# Patient Record
Sex: Female | Born: 1940 | Race: Asian | Hispanic: No | Smoking: Never smoker
Health system: Southern US, Community
[De-identification: ages and names within clinical notes are randomized; demographics above are authoritative.]

## PROBLEM LIST (undated history)

## (undated) DIAGNOSIS — E559 Vitamin D deficiency, unspecified: Secondary | ICD-10-CM

## (undated) DIAGNOSIS — R1013 Epigastric pain: Secondary | ICD-10-CM

## (undated) DIAGNOSIS — N390 Urinary tract infection, site not specified: Secondary | ICD-10-CM

## (undated) DIAGNOSIS — I10 Essential (primary) hypertension: Secondary | ICD-10-CM

## (undated) DIAGNOSIS — M1990 Unspecified osteoarthritis, unspecified site: Secondary | ICD-10-CM

## (undated) DIAGNOSIS — K589 Irritable bowel syndrome without diarrhea: Secondary | ICD-10-CM

## (undated) DIAGNOSIS — K649 Unspecified hemorrhoids: Secondary | ICD-10-CM

## (undated) DIAGNOSIS — K5790 Diverticulosis of intestine, part unspecified, without perforation or abscess without bleeding: Secondary | ICD-10-CM

## (undated) DIAGNOSIS — E785 Hyperlipidemia, unspecified: Secondary | ICD-10-CM

## (undated) DIAGNOSIS — D592 Drug-induced nonautoimmune hemolytic anemia: Secondary | ICD-10-CM

## (undated) DIAGNOSIS — K449 Diaphragmatic hernia without obstruction or gangrene: Secondary | ICD-10-CM

## (undated) DIAGNOSIS — K219 Gastro-esophageal reflux disease without esophagitis: Secondary | ICD-10-CM

## (undated) DIAGNOSIS — T7840XA Allergy, unspecified, initial encounter: Secondary | ICD-10-CM

## (undated) DIAGNOSIS — I700 Atherosclerosis of aorta: Secondary | ICD-10-CM

## (undated) DIAGNOSIS — K2970 Gastritis, unspecified, without bleeding: Secondary | ICD-10-CM

## (undated) DIAGNOSIS — D539 Nutritional anemia, unspecified: Secondary | ICD-10-CM

## (undated) DIAGNOSIS — E039 Hypothyroidism, unspecified: Secondary | ICD-10-CM

## (undated) DIAGNOSIS — R159 Full incontinence of feces: Secondary | ICD-10-CM

## (undated) HISTORY — DX: Diverticulosis of intestine, part unspecified, without perforation or abscess without bleeding: K57.90

## (undated) HISTORY — DX: Hyperlipidemia, unspecified: E78.5

## (undated) HISTORY — DX: Other disorders of iron metabolism: E83.19

## (undated) HISTORY — DX: Full incontinence of feces: R15.9

## (undated) HISTORY — DX: Essential (primary) hypertension: I10

## (undated) HISTORY — DX: Unspecified hemorrhoids: K64.9

## (undated) HISTORY — PX: COLONOSCOPY: SHX174

## (undated) HISTORY — DX: Gastritis, unspecified, without bleeding: K29.70

## (undated) HISTORY — DX: Gastro-esophageal reflux disease without esophagitis: K21.9

## (undated) HISTORY — DX: Vitamin D deficiency, unspecified: E55.9

## (undated) HISTORY — DX: Urinary tract infection, site not specified: N39.0

## (undated) HISTORY — DX: Atherosclerosis of aorta: I70.0

## (undated) HISTORY — DX: Drug-induced nonautoimmune hemolytic anemia: D59.2

## (undated) HISTORY — DX: Irritable bowel syndrome, unspecified: K58.9

## (undated) HISTORY — DX: Epigastric pain: R10.13

## (undated) HISTORY — DX: Hypothyroidism, unspecified: E03.9

## (undated) HISTORY — DX: Diaphragmatic hernia without obstruction or gangrene: K44.9

## (undated) HISTORY — DX: Allergy, unspecified, initial encounter: T78.40XA

## (undated) HISTORY — DX: Nutritional anemia, unspecified: D53.9

---

## 2003-12-30 ENCOUNTER — Encounter: Admission: RE | Admit: 2003-12-30 | Discharge: 2003-12-30 | Payer: Self-pay | Admitting: Internal Medicine

## 2004-03-04 ENCOUNTER — Encounter (INDEPENDENT_AMBULATORY_CARE_PROVIDER_SITE_OTHER): Payer: Self-pay | Admitting: *Deleted

## 2004-03-04 ENCOUNTER — Ambulatory Visit (HOSPITAL_COMMUNITY): Admission: RE | Admit: 2004-03-04 | Discharge: 2004-03-04 | Payer: Self-pay | Admitting: *Deleted

## 2004-07-28 ENCOUNTER — Other Ambulatory Visit: Admission: RE | Admit: 2004-07-28 | Discharge: 2004-07-28 | Payer: Self-pay | Admitting: Internal Medicine

## 2007-01-18 ENCOUNTER — Encounter: Admission: RE | Admit: 2007-01-18 | Discharge: 2007-01-18 | Payer: Self-pay | Admitting: Internal Medicine

## 2007-01-27 ENCOUNTER — Encounter: Admission: RE | Admit: 2007-01-27 | Discharge: 2007-01-27 | Payer: Self-pay | Admitting: Internal Medicine

## 2008-04-16 ENCOUNTER — Encounter: Admission: RE | Admit: 2008-04-16 | Discharge: 2008-07-15 | Payer: Self-pay | Admitting: Internal Medicine

## 2009-05-08 ENCOUNTER — Encounter: Admission: RE | Admit: 2009-05-08 | Discharge: 2009-05-08 | Payer: Self-pay | Admitting: Internal Medicine

## 2010-04-04 ENCOUNTER — Encounter: Payer: Self-pay | Admitting: Emergency Medicine

## 2010-04-05 ENCOUNTER — Encounter: Payer: Self-pay | Admitting: Internal Medicine

## 2010-07-31 NOTE — Op Note (Signed)
NAMECHARIKA, [name] NO.:  [id number]   MEDICAL RECORD NO.:  [id number]          PATIENT TYPE:  AMB   LOCATION:  ENDO                         FACILITY:  [facility]   PHYSICIAN:  Georgiana Spinner, M.D.    DATE OF BIRTH:  [birth year]   DATE OF PROCEDURE:  DATE OF DISCHARGE:                                 OPERATIVE REPORT   PROCEDURE:  Colonoscopy.   INDICATIONS FOR PROCEDURE:  Colon cancer screening.   ANESTHESIA:  Demerol 10, Versed 2 mg.   DESCRIPTION OF PROCEDURE:  With the patient mildly sedated in the left  lateral decubitus position, the Olympus videoscopic colonoscope was inserted  in the rectum and passed under direct vision with pressure applied to the  abdomen to the cecum, identified by the ileocecal valve and crow's foot of  the cecum.  From this point, the colonoscope was slowly withdrawn, taking  circumferential views of the colonic mucosa, stopping only in the rectum  which appeared normal on direct and showed hemorrhoids on retroflex view.  The endoscope was straightened and withdrawn.  The patient's vital signs and  pulse oximetry remained stable.  The patient tolerated the procedure well  without apparent complications.   FINDINGS:  Internal hemorrhoids.  Otherwise unremarkable exam.   PLAN:  See endoscopy note for further details.      GMO/MEDQ  D:  03/04/2004  T:  03/04/2004  Job:  [id number]

## 2010-07-31 NOTE — Op Note (Signed)
NAMEDARICE, VICARIO NO.:  [id number]   MEDICAL RECORD NO.:  [id number]          PATIENT TYPE:  AMB   LOCATION:  ENDO                         FACILITY:  [facility]   PHYSICIAN:  Georgiana Spinner, M.D.    DATE OF BIRTH:  10-16-1940   DATE OF PROCEDURE:  03/04/2004  DATE OF DISCHARGE:                                 OPERATIVE REPORT   PROCEDURE:  Upper endoscopy.   INDICATIONS:  GERD.   ANESTHESIA:  1.  Demerol 50 mg.  2.  Versed 5 mg.   DESCRIPTION OF PROCEDURE:  With patient mildly sedated in the left lateral  decubitus position, the Olympus videoscopic endoscope was inserted in the  mouth, passed under direct vision through the esophagus, which appeared  normal, into the stomach.  Fundus appeared normal.  Body showed a patchy,  erythematous change which was photographed and subsequently biopsied.  We  entered through the antrum and pylorus into the duodenal bulb, second  portion of duodenum, both of which appeared normal.  From this point, the  endoscope was slowly withdrawn, taking circumferential views of the duodenal  mucosa until the endoscope had been pulled back into the stomach, placed in  retroflexion to view the stomach from below.  The endoscope was  straightened, withdrawn, taking circumferential views of the remaining  gastric and esophageal mucosa.  The patient's vital signs and pulse oximeter  remained stable.  The patient tolerated the procedure well without apparent  complications.   FINDINGS:  Patchy, erythematous mucosa of the body of the stomach.  Await  biopsy report.  The patient will call me for results and follow up with me  as an outpatient.  Proceed to colonoscopy.      GMO/MEDQ  D:  03/04/2004  T:  03/04/2004  Job:  [id number]

## 2010-12-24 ENCOUNTER — Encounter: Payer: Self-pay | Admitting: Internal Medicine

## 2011-01-14 ENCOUNTER — Encounter: Payer: Self-pay | Admitting: Internal Medicine

## 2011-01-14 ENCOUNTER — Ambulatory Visit: Payer: Self-pay | Admitting: Internal Medicine

## 2011-01-18 ENCOUNTER — Encounter: Payer: Self-pay | Admitting: *Deleted

## 2011-01-25 ENCOUNTER — Encounter: Payer: Self-pay | Admitting: Internal Medicine

## 2011-01-25 ENCOUNTER — Ambulatory Visit (INDEPENDENT_AMBULATORY_CARE_PROVIDER_SITE_OTHER): Payer: Medicare Other | Admitting: Internal Medicine

## 2011-01-25 DIAGNOSIS — E119 Type 2 diabetes mellitus without complications: Secondary | ICD-10-CM | POA: Insufficient documentation

## 2011-01-25 DIAGNOSIS — R110 Nausea: Secondary | ICD-10-CM

## 2011-01-25 DIAGNOSIS — E039 Hypothyroidism, unspecified: Secondary | ICD-10-CM | POA: Insufficient documentation

## 2011-01-25 DIAGNOSIS — I10 Essential (primary) hypertension: Secondary | ICD-10-CM | POA: Insufficient documentation

## 2011-01-25 DIAGNOSIS — K219 Gastro-esophageal reflux disease without esophagitis: Secondary | ICD-10-CM | POA: Insufficient documentation

## 2011-01-25 DIAGNOSIS — M810 Age-related osteoporosis without current pathological fracture: Secondary | ICD-10-CM | POA: Insufficient documentation

## 2011-01-25 DIAGNOSIS — R1013 Epigastric pain: Secondary | ICD-10-CM

## 2011-01-25 DIAGNOSIS — E785 Hyperlipidemia, unspecified: Secondary | ICD-10-CM | POA: Insufficient documentation

## 2011-01-25 MED ORDER — ONDANSETRON 4 MG PO TBDP: ORAL_TABLET | ORAL | Status: DC

## 2011-01-25 MED ORDER — PANTOPRAZOLE SODIUM 40 MG PO TBEC: 40.0000 mg | DELAYED_RELEASE_TABLET | Freq: Every day | ORAL | Status: DC

## 2011-01-25 NOTE — Patient Instructions (Signed)
We scheduled the Endoscopy with Dr. Rhea Belton.  We sent prescriptions for the Protonix and the Zofran ODT nausea medication to [facility].  Directions and brochure provided.Upper GI Endoscopy Upper GI endoscopy means using a flexible scope to look at the esophagus, stomach, and upper small bowel. This is done to make a diagnosis in people with heartburn, abdominal pain, or abnormal bleeding. Sometimes an endoscope is needed to remove foreign bodies or food that become stuck in the esophagus; it can also be used to take biopsy samples. For the best results, do not eat or drink for 8 hours before having your upper endoscopy.  To perform the endoscopy, you will probably be sedated and your throat will be numbed with a special spray. The endoscope is then slowly passed down your throat (this will not interfere with your breathing). An endoscopy exam takes 15 to 30 minutes to complete and there is no real pain. Patients rarely remember much about the procedure. The results of the test may take several days if a biopsy or other test is taken.  You may have a sore throat after an endoscopy exam. Serious complications are very rare. Stick to liquids and soft foods until your pain is better. Do not drive a car or operate any dangerous equipment for at least 24 hours after being sedated. SEEK IMMEDIATE MEDICAL CARE IF:   You have severe throat pain.   You have shortness of breath.   You have bleeding problems.   You have a fever.   You have difficulty recovering from your sedation.  Document Released: 04/08/2004 Document Revised: 11/11/2010 Document Reviewed: 03/03/2008 [facility] Patient Information 2012 [place], [place].

## 2011-01-25 NOTE — Progress Notes (Signed)
Subjective:    Patient ID: Lauren [name], female    DOB: 02-Sep-1940, 70 y.o.   MRN: [id number]  HPI Lauren [name] is a 70 year old [place] female with a PMH of hypertension, hyperlipidemia, diabetes, hypothyroidism, GERD, osteoporosis, irritable bowel syndrome who is seen in consultation at the request of Dr.Pang for evaluation of epigastric pain weight loss.  The patient is accompanied today by a family member who is interpreting for her.  The patient reports epigastric pain over the last several weeks to months. This pain is worse with eating. It does not radiate. She also has associated nausea but no vomiting. She has lost 8-10 pounds over the last 4-5 months. She has been using lansoprazole 15 mg daily, and she feels this medication has helped, but not completely relieved her symptoms. Her PCP did prescribe pantoprazole to replace the lansoprazole, but she never received this medication by mail. The patient denies dysphagia or odynophagia. She was having heartburn however this improved with lansoprazole. She reports her appetite is "okay", but at times she feels full quickly. She denies rectal bleeding or melena. Several weeks ago she was having trouble with loose stool, however now she feels more constipation. She is not using anything for constipation at present. No fevers chills or night sweats.   Review of Systems Constitutional: Negative for fever, chills, night sweats, activity change, appetite change and unexpected weight change HEENT: Negative for sore throat, mouth sores and trouble swallowing. Eyes: Negative for visual disturbance Respiratory: Negative for cough, chest tightness and shortness of breath Cardiovascular: Negative for chest pain, palpitations and lower extremity swelling Gastrointestinal: See history of present illness Genitourinary: Negative for dysuria and hematuria. Musculoskeletal: Negative for arthralgias and myalgias, positive for back pain Skin: Negative for rash or color  change Neurological: Negative for weakness, numbness, positive for headaches Hematological: Negative for adenopathy, negative for easy bruising/bleeding Psychiatric/behavioral: Negative for depressed mood, negative for anxiety   Past Medical History  Diagnosis Date  . Diabetes mellitus   . Hypertension   . Hyperlipidemia   . Hypothyroidism   . GERD (gastroesophageal reflux disease)   . Osteoporosis   . IBS (irritable bowel syndrome)   . Dyspepsia   . UTI (lower urinary tract infection)    Current Outpatient Prescriptions  Medication Sig Dispense Refill  . alendronate (FOSAMAX) 70 MG tablet Take 70 mg by mouth every 7 (seven) days. Take with a full glass of water on an empty stomach.       Marland Kitchen aspirin 81 MG tablet Take 81 mg by mouth daily.        Marland Kitchen levothyroxine (SYNTHROID, LEVOTHROID) 75 MCG tablet Take 75 mcg by mouth daily.        Marland Kitchen losartan (COZAAR) 100 MG tablet Take 100 mg by mouth daily.        . metFORMIN (GLUCOPHAGE) 500 MG tablet Take 500 mg by mouth 2 (two) times daily with a meal.        . pravastatin (PRAVACHOL) 40 MG tablet Take 40 mg by mouth daily.        . sitaGLIPtan-metformin (JANUMET) 50-500 MG per tablet Take 1 tablet by mouth 2 (two) times daily with a meal.        . ondansetron (ZOFRAN ODT) 4 MG disintegrating tablet Take 1 tab by mouth every 6 hours as needed for nausea.  30 tablet  2  . pantoprazole (PROTONIX) 40 MG tablet Take 1 tablet (40 mg total) by mouth daily.  30 tablet  1  No Known Allergies  Family History  Problem Relation Age of Onset  . Colon cancer Neg Hx   --neg for gastric cancer   Social History  . Marital Status: Married   Social History Main Topics  . Smoking status: Never Smoker   . Smokeless tobacco: None  . Alcohol Use: No  . Drug Use: No      Objective:   Physical Exam BP 140/72  Pulse 64  Ht 5' (1.524 m)  Wt 116 lb 9.6 oz (52.889 kg)  BMI 22.77 kg/m2 Constitutional: Well-developed and well-nourished. No  distress. HEENT: Normocephalic and atraumatic. Oropharynx is clear and moist. No oropharyngeal exudate. Conjunctivae are normal. Pupils are equal round and reactive to light. No scleral icterus. Neck: Neck supple. Trachea midline. Cardiovascular: Normal rate, regular rhythm and intact distal pulses. No M/R/G Pulmonary/chest: Effort normal and breath sounds normal. No wheezing, rales or rhonchi. Abdominal: Soft, nontender, nondistended. Bowel sounds active throughout. There are no masses palpable. No hepatosplenomegaly. Extremities: no clubbing, cyanosis, or edema Lymphadenopathy: No cervical adenopathy noted. Neurological: Alert and oriented to person place and time. Skin: Skin is warm and dry. No rashes noted. Psychiatric: Normal mood and affect. Behavior is normal.     Assessment & Plan:  70 year old [place] female with a PMH of hypertension, hyperlipidemia, diabetes, hypothyroidism, GERD, osteoporosis, irritable bowel syndrome who is seen in consultation at the request of Dr.Pang for evaluation of epigastric pain weight loss.  1. Epigastric pain/weight loss -- given the patient's epigastric pain and weight loss upper endoscopy is reasonable. She is of Asian descent, and therefore at higher risk for gastric cancer. She does not have a known history of H. pylori. She continues on lansoprazole, and it seems she has benefited from this medication, however not completely.  Given the incomplete response, switching to pantoprazole is reasonable. Therefore I will prescribe pantoprazole 40 mg daily. We discussed how best to take this, specifically 30 minutes to one hour before her first meal of the day. I will prescribe Zofran 4 mg by mouth q. 6 hours when necessary nausea.  Finally, if the upper endoscopy is unrevealing, then I recommend proceeding with CT scan of the abdomen and pelvis.  Also in the differential given her diabetes is gastroparesis. Formal testing for this can be pursued if workup  negative.  Followup to be determined after endoscopy.  2. CRC screening -- the patient is average risk for colorectal cancer screening. She had an unremarkable colonoscopy, except for internal hemorrhoids on 03/04/2004. She is therefore due repeat colorectal cancer screening with colonoscopy in December 2015.

## 2011-02-09 ENCOUNTER — Encounter: Payer: Self-pay | Admitting: Internal Medicine

## 2011-02-09 ENCOUNTER — Ambulatory Visit (AMBULATORY_SURGERY_CENTER): Payer: Medicare Other | Admitting: Internal Medicine

## 2011-02-09 DIAGNOSIS — D131 Benign neoplasm of stomach: Secondary | ICD-10-CM

## 2011-02-09 DIAGNOSIS — K2970 Gastritis, unspecified, without bleeding: Secondary | ICD-10-CM

## 2011-02-09 DIAGNOSIS — R110 Nausea: Secondary | ICD-10-CM

## 2011-02-09 DIAGNOSIS — R1013 Epigastric pain: Secondary | ICD-10-CM

## 2011-02-09 MED ORDER — SODIUM CHLORIDE 0.9 % IV SOLN: 500.0000 mL | INTRAVENOUS | Status: DC

## 2011-02-09 NOTE — Patient Instructions (Signed)
Discharge instructions per blue and green sheets  Mild gastritis- handout given on gastritis  Hiatal hernia- handout given  We will mail you a letter in 1-2 weeks with the pathology results and dr pyrtles recommendations  Continue your medicine for reflux daily. It is best to take this 20-30 minutes before your first meal of the day every day

## 2011-02-09 NOTE — Progress Notes (Signed)
Patient did not experience any of the following events: a burn prior to discharge; a fall within the facility; wrong site/side/patient/procedure/implant event; or a hospital transfer or hospital admission upon discharge from the facility. (G8907) Patient did not have preoperative order for IV antibiotic SSI prophylaxis. (G8918)  

## 2011-02-10 ENCOUNTER — Telehealth: Payer: Self-pay | Admitting: *Deleted

## 2011-02-10 NOTE — Telephone Encounter (Signed)

## 2011-02-16 ENCOUNTER — Encounter: Payer: Self-pay | Admitting: Internal Medicine

## 2011-04-15 ENCOUNTER — Encounter: Payer: Self-pay | Admitting: Gynecology

## 2011-04-15 ENCOUNTER — Ambulatory Visit (INDEPENDENT_AMBULATORY_CARE_PROVIDER_SITE_OTHER): Payer: Medicare Other | Admitting: Gynecology

## 2011-04-15 ENCOUNTER — Telehealth: Payer: Self-pay | Admitting: *Deleted

## 2011-04-15 VITALS — BP 122/70 | Ht 59.25 in | Wt 116.0 lb

## 2011-04-15 DIAGNOSIS — R102 Pelvic and perineal pain unspecified side: Secondary | ICD-10-CM

## 2011-04-15 DIAGNOSIS — K921 Melena: Secondary | ICD-10-CM

## 2011-04-15 DIAGNOSIS — E65 Localized adiposity: Secondary | ICD-10-CM

## 2011-04-15 DIAGNOSIS — N952 Postmenopausal atrophic vaginitis: Secondary | ICD-10-CM

## 2011-04-15 DIAGNOSIS — Z1211 Encounter for screening for malignant neoplasm of colon: Secondary | ICD-10-CM

## 2011-04-15 DIAGNOSIS — M810 Age-related osteoporosis without current pathological fracture: Secondary | ICD-10-CM

## 2011-04-15 DIAGNOSIS — N949 Unspecified condition associated with female genital organs and menstrual cycle: Secondary | ICD-10-CM

## 2011-04-15 NOTE — Telephone Encounter (Signed)
Pt informed with the below note. 

## 2011-04-15 NOTE — Progress Notes (Signed)
Addended by: Richardson Chiquito on: 04/15/2011 10:36 AM   Modules accepted: Orders

## 2011-04-15 NOTE — Telephone Encounter (Signed)
The following was in the patient instructions that were given to her as part of the after visit summary:  Please remember to followup with Dr. Ricki Miller next month. We will need to discuss with him when you next bone density study is due. Please have and sending a copy of that report. Any to let him know that we will be checking today year calcium vitamin D and PTH because of your history of osteoporosis. Also you we'll need to discuss with him that if her bone mineral density in your DEXA scan is stable that you should be on the Fosamax for not more than 6-7 years and then to go on a drug-free holiday to continue to monitor your bone density. Remember the importance of weightbearing exercises 3-4 times a week. He may want to try either Caltrate or Os-Cal or Citracal one tablet in the morning and one in the evening.

## 2011-04-15 NOTE — Telephone Encounter (Signed)
Pt seen today and said that you were going to print some paper work off for here to take to dr. Ricki Miller office? Please advise

## 2011-04-15 NOTE — Progress Notes (Signed)
Lauren [name] 11-23-1940 [id number]   History:    71 y.o.  for GYN exam. Patient is a new patient to the practice. Her primary physician is Dr. Ricki Miller who has been monitoring her and treating her for type 2 diabetes, hypercholesterolemia, hypothyroidism and osteoporosis. Patient stated her last bone density study was over 3 years ago. She cannot recall how long she has been on Fosamax. Her last colonoscopy 5 years ago reported to be normal in 2012 had a normal endoscopy. Mammogram done January this year result pending patient frequently does her self breast examination. She is not taking her calcium and vitamin D.  Past medical history,surgical history, family history and social history were all reviewed and documented in the EPIC chart.  Gynecologic History No LMP recorded. Patient is postmenopausal. Contraception: none Last Pap: Several years ago. Results were: normal Last mammogram: 2013. Results were: Results pending  Obstetric History OB History    Grav Para Term Preterm Abortions TAB SAB Ect Mult Living   5 3   2 2    3      # Outc Date GA Lbr Len/2nd Wgt Sex Del Anes PTL Lv   1 PAR            2 PAR            3 PAR            4 TAB            5 TAB                ROS:  Was performed and pertinent positives and negatives are included in the history.  Exam: chaperone present  BP 122/70  Ht 4' 11.25" (1.505 m)  Wt 116 lb (52.617 kg)  BMI 23.23 kg/m2  Body mass index is 23.23 kg/(m^2).  General appearance : Well developed well nourished female. No acute distress HEENT: Neck supple, trachea midline, no carotid bruits, no thyroidmegaly Lungs: Clear to auscultation, no rhonchi or wheezes, or rib retractions  Heart: Regular rate and rhythm, no murmurs or gallops Breast:Examined in sitting and supine position were symmetrical in appearance, no palpable masses or tenderness,  no skin retraction, no nipple inversion, no nipple discharge, no skin discoloration, no axillary or  supraclavicular lymphadenopathy Abdomen: no palpable masses or tenderness, no rebound or guarding Extremities: no edema or skin discoloration or tenderness  Pelvic:  Bartholin, Urethra, Skene Glands: Within normal limits             Vagina: No gross lesions or discharge atrophy  Cervix: No gross lesions or discharge  Uterus  axial, normal size, shape and consistency, non-tender and mobile  Adnexa  Without masses or tenderness  Anus and perineum  normal   Rectovaginal  normal sphincter tone without palpated masses or tenderness             Hemoccult obtained results pending at time of this dictation     Assessment/Plan:  71 y.o. female with normal GYN exam with the exception of vaginal atrophy. Since she has a history of osteoporosis and has not been taking any calcium or vitamin D we'll check a calcium level along with a vitamin D and PTH. She is scheduled to follow with Dr. Ricki Miller next month and she will discuss with him about when she is due for her next bone density study and asked that we can a copy so we can obtain her records as well. Also she will need to talk with him  about drug-free holiday she's not sure how long she has been on Fosamax. We'll do no other lab work since this will be done if his office. New Pap smear guidelines were discussed. She will no longer need any Pap smears and none was done today. Fecal occult blood testing done result pending at time of this dictation. She was encouraged to continue to do her monthly self breast examinations.    Ok Edwards MD, 10:02 AM 04/15/2011

## 2011-04-15 NOTE — Patient Instructions (Signed)
Please remember to followup with Dr. Ricki Miller next month. We will need to discuss with him when you next bone density study is due. Please have and sending a copy of that report. Any to let him know that we will be checking today year calcium vitamin D and PTH because of your history of osteoporosis. Also you we'll need to discuss with him that if her bone mineral density in your DEXA scan is stable that you should be on the Fosamax for not more than 6-7 years and then to go on a drug-free holiday to continue to monitor your bone density. Remember the importance of weightbearing exercises 3-4 times a week. He may want to try either Caltrate or Os-Cal or Citracal one tablet in the morning and one in the evening.

## 2011-04-16 LAB — VITAMIN D 25 HYDROXY (VIT D DEFICIENCY, FRACTURES): Vit D, 25-Hydroxy: 46 ng/mL (ref 30–89)

## 2011-04-26 ENCOUNTER — Encounter: Payer: Self-pay | Admitting: Internal Medicine

## 2011-04-26 ENCOUNTER — Ambulatory Visit (INDEPENDENT_AMBULATORY_CARE_PROVIDER_SITE_OTHER): Payer: Medicare Other | Admitting: Internal Medicine

## 2011-04-26 DIAGNOSIS — K219 Gastro-esophageal reflux disease without esophagitis: Secondary | ICD-10-CM

## 2011-04-26 DIAGNOSIS — R109 Unspecified abdominal pain: Secondary | ICD-10-CM

## 2011-04-26 DIAGNOSIS — R159 Full incontinence of feces: Secondary | ICD-10-CM

## 2011-04-26 DIAGNOSIS — R195 Other fecal abnormalities: Secondary | ICD-10-CM

## 2011-04-26 MED ORDER — PEG-KCL-NACL-NASULF-NA ASC-C 100 G PO SOLR: 1.0000 | Freq: Once | ORAL | Status: DC

## 2011-04-26 NOTE — Progress Notes (Signed)
Subjective:    Patient ID: Lauren [name], female    DOB: [birth year], 71 y.o.   MRN: [id number]  HPI 71 year old [place] female with a PMH of hypertension, hyperlipidemia, diabetes, hypothyroidism, GERD, osteoporosis, irritable bowel syndrome who is seen in followup. I last saw her in clinic in November 2012 for epigastric abdominal pain weight loss. Upper endoscopy was performed which revealed very mild gastritis and biopsies were negative for dysplasia or H. pylori infection.  At that time we changed her PPI from lansoprazole to pantoprazole, however she felt lansoprazole work better, and ended up switching back to this medication.  She remains on lansoprazole now, and overall her epigastric pain is improved. She still feels nausea associated with fatty foods but no vomiting. No dysphagia or odynophagia. Heartburn is not a big issue at present.  Of recent, she was seen by her PCP, in an office rectal exam revealed heme positive stool.  Her last colonoscopy was in 2005 and revealed only internal hemorrhoids. She does note occasional constipation, and at times loose stools. This varies and has for years. She also does note some fecal seepage, which usually occurs with voiding.  She denies dysuria. Overall she feels her weight is stable. Appetite remains "okay".  Very occasional lower abdominal pain, often relieved with BM.  Review of Systems No fevers or chills. As per history of present illness, otherwise negative  Patient Active Problem List  Diagnoses  . HTN (hypertension)  . Hyperlipidemia  . Diabetes mellitus  . GERD (gastroesophageal reflux disease)  . Osteoporosis  . Hypothyroidism   Past Surgical History  Procedure Date  . None    Current Outpatient Prescriptions  Medication Sig Dispense Refill  . alendronate (FOSAMAX) 70 MG tablet Take 70 mg by mouth every 7 (seven) days. Take with a full glass of water on an empty stomach.       Marland Kitchen aspirin 81 MG tablet Take 81 mg by mouth daily.          Marland Kitchen atorvastatin (LIPITOR) 40 MG tablet Take 40 mg by mouth daily.      . fish oil-omega-3 fatty acids 1000 MG capsule Take 2 g by mouth daily.      Marland Kitchen glucosamine-chondroitin 500-400 MG tablet Take 1 tablet by mouth 3 (three) times daily.      Marland Kitchen levothyroxine (SYNTHROID, LEVOTHROID) 75 MCG tablet Take 75 mcg by mouth daily.        Marland Kitchen losartan (COZAAR) 100 MG tablet Take 100 mg by mouth daily.        . metFORMIN (GLUCOPHAGE) 500 MG tablet Take 500 mg by mouth 2 (two) times daily with a meal.        . Multiple Vitamin (MULTIVITAMIN) tablet Take 1 tablet by mouth daily.      Marland Kitchen omeprazole (PRILOSEC) 10 MG capsule Take 20 mg by mouth daily.      . ondansetron (ZOFRAN ODT) 4 MG disintegrating tablet Take 1 tab by mouth every 6 hours as needed for nausea.  30 tablet  2  . pravastatin (PRAVACHOL) 40 MG tablet Take 40 mg by mouth daily.        . sitaGLIPtan-metformin (JANUMET) 50-500 MG per tablet Take 1 tablet by mouth 2 (two) times daily with a meal.        . peg 3350 powder (MOVIPREP) 100 G SOLR Take 1 kit (100 g total) by mouth once.  1 kit  0   Allergies  Allergen Reactions  . Nexium  Pain and nausea  . Protonix     abd pain and nausea   SH - reviewed and no change FH - reviewed and no change    Objective:   Physical Exam BP 150/70  Pulse 74  Ht 4\' 11"  (1.499 m)  Wt 115 lb 9.6 oz (52.436 kg)  BMI 23.35 kg/m2 Constitutional: Well-developed and well-nourished. No distress.  HEENT: Normocephalic and atraumatic. Oropharynx is clear and moist. No oropharyngeal exudate. Conjunctivae are normal. Pupils are equal round and reactive to light. No scleral icterus.  Neck: Neck supple. Trachea midline.  Cardiovascular: Normal rate, regular rhythm and intact distal pulses. No M/R/G  Pulmonary/chest: Effort normal and breath sounds normal. No wheezing, rales or rhonchi.  Abdominal: Soft, nontender, nondistended. Bowel sounds active throughout. There are no masses palpable. No hepatosplenomegaly.   Extremities: no clubbing, cyanosis, or edema  Lymphadenopathy: No cervical adenopathy noted.  Neurological: Alert and oriented to person place and time.  Skin: Skin is warm and dry. No rashes noted.  Psychiatric: Normal mood and affect. Behavior is normal.      Assessment & Plan:  71 year old [place] female with a PMH of hypertension, hyperlipidemia, diabetes, hypothyroidism, GERD, osteoporosis, irritable bowel syndrome who is seen in followup.  1. Heme + stools -- the patient had been normal colonoscopy, except for internal hemorrhoids in 2005. She would be due for repeat screening in 2015, however with the recent finding of heme positive stool, colonoscopy likely needs to be repeated now.  We've discussed this test, and she is agreeable to proceed.  2. Weight loss -- the patient's weight since early November 2012 is very stable. She is down approximately 1 pound since her last visit with me.  At this point, I do not get the sense that she is dramatically losing weight or any new symptoms or present.  Of note she had a CT scan in 2008 for weight loss and abdominal pain which was unremarkable. We will continue to monitor this going forward.  3. GERD -- she will continue on lansoprazole. There was no evidence of H. pylori on recent biopsy.

## 2011-04-26 NOTE — Patient Instructions (Signed)
You have been scheduled for a colonoscopy. Please follow written instructions given to you at your visit today.  Please pick up your prep kit at the pharmacy within the next 2-3 days.  We have sent the following medications to your pharmacy for you to pick up at your convenience: Moviprep   

## 2011-05-13 ENCOUNTER — Encounter: Payer: Self-pay | Admitting: Internal Medicine

## 2011-05-13 ENCOUNTER — Ambulatory Visit (AMBULATORY_SURGERY_CENTER): Payer: Medicare Other | Admitting: Internal Medicine

## 2011-05-13 DIAGNOSIS — R159 Full incontinence of feces: Secondary | ICD-10-CM

## 2011-05-13 DIAGNOSIS — R109 Unspecified abdominal pain: Secondary | ICD-10-CM

## 2011-05-13 DIAGNOSIS — R195 Other fecal abnormalities: Secondary | ICD-10-CM

## 2011-05-13 DIAGNOSIS — K219 Gastro-esophageal reflux disease without esophagitis: Secondary | ICD-10-CM

## 2011-05-13 LAB — GLUCOSE, CAPILLARY: Glucose-Capillary: 91 mg/dL (ref 70–99)

## 2011-05-13 MED ORDER — SODIUM CHLORIDE 0.9 % IV SOLN: 500.0000 mL | INTRAVENOUS | Status: DC

## 2011-05-13 NOTE — Op Note (Signed)
[facility] [address] [place], [place]  [address]  COLONOSCOPY PROCEDURE REPORT  PATIENT:  Lauren [name], Lauren [name]  MR#:  [id number] BIRTHDATE:  [birth year], 70 yrs. old  GENDER:  female ENDOSCOPIST:  Carie Caddy. Deretha Ertle, MD REF. BY:  Juline Patch, M.D. PROCEDURE DATE:  05/13/2011 PROCEDURE:  Diagnostic Colonoscopy ASA CLASS:  Class II INDICATIONS:  heme positive stool, Abdominal pain MEDICATIONS:   MAC sedation, administered by CRNA, propofol (Diprivan) 200 mg IV  DESCRIPTION OF PROCEDURE:   After the risks benefits and alternatives of the procedure were thoroughly explained, informed consent was obtained.  Digital rectal exam was performed and revealed decreased sphincter tone.   The LB [id number]-[id number] endoscope was introduced through the anus and advanced to the cecum, which was identified by both the appendix and ileocecal valve, without limitations.  The quality of the prep was good, using MoviPrep.  The instrument was then slowly withdrawn as the colon was fully examined. <<PROCEDUREIMAGES>>  FINDINGS:  Mild diverticulosis was found ascending colon to sigmoid colon.  Otherwise unremarkable examination of the colon. Small internal hemorrhoids were found.   Retroflexed views in the rectum revealed no other findings other than those already described.  The scope was then withdrawn from the cecum and the procedure completed.  COMPLICATIONS:  None  ENDOSCOPIC IMPRESSION: 1) Mild diverticulosis ascending colon to sigmoid colon 2) Internal hemorrhoids 3) Decreased anal sphincter tone.  RECOMMENDATIONS: 1) Continue current medications 2) High fiber diet. 3) Office follow-up as needed.  Carie Caddy. Rhea Belton, MD  CC:  Juline Patch, MD The Patient  n. eSIGNED:   Carie Caddy. Vaughn Frieze at 05/13/2011 02:39 PM  Eloise Harman, [phone]

## 2011-05-13 NOTE — Patient Instructions (Signed)

## 2011-05-13 NOTE — Progress Notes (Signed)
Patient did not experience any of the following events: a burn prior to discharge; a fall within the facility; wrong site/side/patient/procedure/implant event; or a hospital transfer or hospital admission upon discharge from the facility. (G8907) Patient did not have preoperative order for IV antibiotic SSI prophylaxis. (G8918)  

## 2011-05-14 ENCOUNTER — Telehealth: Payer: Self-pay | Admitting: *Deleted

## 2011-05-14 NOTE — Telephone Encounter (Signed)
Left message on number given in admitting yest. ewm 

## 2011-11-07 ENCOUNTER — Ambulatory Visit (INDEPENDENT_AMBULATORY_CARE_PROVIDER_SITE_OTHER): Payer: Medicare Other | Admitting: Family Medicine

## 2011-11-07 VITALS — BP 127/74 | HR 82 | Temp 98.1°F | Resp 16 | Ht 61.0 in | Wt 118.0 lb

## 2011-11-07 DIAGNOSIS — N390 Urinary tract infection, site not specified: Secondary | ICD-10-CM

## 2011-11-07 DIAGNOSIS — R350 Frequency of micturition: Secondary | ICD-10-CM

## 2011-11-07 LAB — POCT URINALYSIS DIPSTICK
Ketones, UA: NEGATIVE
Protein, UA: 6.5
pH, UA: 6.5

## 2011-11-07 LAB — POCT UA - MICROSCOPIC ONLY
Crystals, Ur, HPF, POC: NEGATIVE
Epithelial cells, urine per micros: NEGATIVE
Mucus, UA: NEGATIVE

## 2011-11-07 MED ORDER — NITROFURANTOIN MONOHYD MACRO 100 MG PO CAPS: 100.0000 mg | ORAL_CAPSULE | Freq: Two times a day (BID) | ORAL | Status: AC

## 2011-11-07 MED ORDER — FLUCONAZOLE 150 MG PO TABS: 150.0000 mg | ORAL_TABLET | Freq: Once | ORAL | Status: AC

## 2011-11-07 NOTE — Progress Notes (Signed)
 [facility]:  Office Visit  Chief Complaint:  Chief Complaint  Patient presents with  . Abdominal Pain    low abd. x 2 weeks  . Urinary Frequency    HPI: Lauren [name] is a 71 y.o. female who complains of  abd painand urinary frequency x 2 weeks. Deneis fevers, chills. Tried pyridium.   Past Medical History  Diagnosis Date  . Diabetes mellitus   . Hypertension   . Hyperlipidemia   . Hypothyroidism   . GERD (gastroesophageal reflux disease)   . Osteoporosis   . IBS (irritable bowel syndrome)   . Dyspepsia   . UTI (lower urinary tract infection)    Past Surgical History  Procedure Date  . None   . Colonoscopy    History   Social History  . Marital Status: Widowed    Spouse Name: N/A    Number of Children: 3  . Years of Education: N/A   Occupational History  . retired    Social History Main Topics  . Smoking status: Never Smoker   . Smokeless tobacco: Never Used  . Alcohol Use: No  . Drug Use: No  . Sexually Active: No   Other Topics Concern  . None   Social History Narrative  . None   Family History  Problem Relation Age of Onset  . Colon cancer Neg Hx    Allergies  Allergen Reactions  . Esomeprazole Magnesium Nausea Only    Pain and nausea  . Pantoprazole Sodium Nausea Only    abd pain and nausea   Prior to Admission medications   Medication Sig Start Date End Date Taking? Authorizing Provider  alendronate (FOSAMAX) 70 MG tablet Take 70 mg by mouth every 7 (seven) days. Take with a full glass of water on an empty stomach.    Yes Historical Provider, MD  aspirin 81 MG tablet Take 81 mg by mouth daily.     Yes Historical Provider, MD  atorvastatin (LIPITOR) 40 MG tablet Take 40 mg by mouth daily.   Yes Historical Provider, MD  fish oil-omega-3 fatty acids 1000 MG capsule Take 2 g by mouth daily.   Yes Historical Provider, MD  glucosamine-chondroitin 500-400 MG tablet Take 1 tablet by mouth 3 (three) times daily.   Yes Historical  Provider, MD  lansoprazole (PREVACID) 15 MG capsule Take 15 mg by mouth daily.   Yes Historical Provider, MD  levothyroxine (SYNTHROID, LEVOTHROID) 75 MCG tablet Take 75 mcg by mouth daily.     Yes Historical Provider, MD  losartan (COZAAR) 100 MG tablet Take 100 mg by mouth daily.     Yes Historical Provider, MD  Multiple Vitamin (MULTIVITAMIN) tablet Take 1 tablet by mouth daily.   Yes Historical Provider, MD  omeprazole (PRILOSEC) 10 MG capsule Take 20 mg by mouth daily.   Yes Historical Provider, MD  pravastatin (PRAVACHOL) 40 MG tablet Take 40 mg by mouth daily.     Yes Historical Provider, MD  sitaGLIPtan-metformin (JANUMET) 50-500 MG per tablet Take 1 tablet by mouth 2 (two) times daily with a meal.     Yes Historical Provider, MD  metFORMIN (GLUCOPHAGE) 500 MG tablet Take 500 mg by mouth 2 (two) times daily with a meal.      Historical Provider, MD  ondansetron (ZOFRAN ODT) 4 MG disintegrating tablet Take 1 tab by mouth every 6 hours as needed for nausea. 01/25/11   Beverley Fiedler, MD     ROS: The patient denies fevers,  chills, night sweats, unintentional weight loss, chest pain, palpitations, wheezing, dyspnea on exertion, nausea, vomiting, abdominal pain,  hematuria, melena, numbness, weakness, or tingling. + dysuria  All other systems have been reviewed and were otherwise negative with the exception of those mentioned in the HPI and as above.    PHYSICAL EXAM: Filed Vitals:   11/07/11 1350  BP: 127/74  Pulse: 82  Temp: 98.1 F (36.7 C)  Resp: 16   Filed Vitals:   11/07/11 1350  Height: 5\' 1"  (1.549 m)  Weight: 118 lb (53.524 kg)   Body mass index is 22.30 kg/(m^2).  General: Alert, no acute distress HEENT:  Normocephalic, atraumatic, oropharynx patent.  Cardiovascular:  Regular rate and rhythm, no rubs murmurs or gallops.  No Carotid bruits, radial pulse intact. No pedal edema.  Respiratory: Clear to auscultation bilaterally.  No wheezes, rales, or rhonchi.  No cyanosis,  no use of accessory musculature GI: No organomegaly, abdomen is soft and non-tender, positive bowel sounds.  No masses. Skin: No rashes. Neurologic: Facial musculature symmetric. Psychiatric: Patient is appropriate throughout our interaction. Lymphatic: No cervical lymphadenopathy Musculoskeletal: Gait intact. No CVA tenderness   LABS: Results for orders placed in visit on 11/07/11  POCT URINALYSIS DIPSTICK      Component Value Range   Color, UA yellow     Clarity, UA cloudy     Glucose, UA 100     Bilirubin, UA negative     Ketones, UA negative     Spec Grav, UA 1.010     Blood, UA trace-intact     pH, UA 6.5     Protein, UA 6.5     Urobilinogen, UA 0.2     Nitrite, UA positive     Leukocytes, UA moderate (2+)       EKG/XRAY:   Primary read interpreted by Dr. Conley Rolls at [facility].   ASSESSMENT/PLAN: Encounter Diagnosis  Name Primary?  . Frequent urination Yes   Rx Macrobid x 7 days, Continue with cranberry juice and water Culture urine Rx Diflucan 150 mg for yeast infection on abx    ,  PHUONG, DO 11/07/2011 2:03 PM

## 2011-11-12 ENCOUNTER — Telehealth: Payer: Self-pay | Admitting: Family Medicine

## 2011-11-12 DIAGNOSIS — N390 Urinary tract infection, site not specified: Secondary | ICD-10-CM

## 2011-11-12 LAB — URINE CULTURE: Colony Count: >=100000

## 2011-11-12 MED ORDER — PENICILLIN V POTASSIUM 250 MG PO TABS: 250.0000 mg | ORAL_TABLET | Freq: Four times a day (QID) | ORAL | Status: DC

## 2011-11-12 NOTE — Telephone Encounter (Signed)
Spoke to interpreter about + strep bovis UTI only susceptible to PCN. She is actually doing better on macrobid. Advise to stop and take PCN. Will give short course.Marland Kitchen

## 2011-11-23 ENCOUNTER — Ambulatory Visit (INDEPENDENT_AMBULATORY_CARE_PROVIDER_SITE_OTHER): Payer: Medicare Other | Admitting: Family Medicine

## 2011-11-23 VITALS — BP 124/72 | HR 77 | Temp 97.4°F | Resp 16 | Ht 61.0 in | Wt 116.0 lb

## 2011-11-23 DIAGNOSIS — R109 Unspecified abdominal pain: Secondary | ICD-10-CM

## 2011-11-23 DIAGNOSIS — E119 Type 2 diabetes mellitus without complications: Secondary | ICD-10-CM

## 2011-11-23 DIAGNOSIS — L293 Anogenital pruritus, unspecified: Secondary | ICD-10-CM

## 2011-11-23 DIAGNOSIS — N390 Urinary tract infection, site not specified: Secondary | ICD-10-CM

## 2011-11-23 DIAGNOSIS — N898 Other specified noninflammatory disorders of vagina: Secondary | ICD-10-CM

## 2011-11-23 DIAGNOSIS — R350 Frequency of micturition: Secondary | ICD-10-CM

## 2011-11-23 LAB — POCT UA - MICROSCOPIC ONLY
Bacteria, U Microscopic: NEGATIVE
Mucus, UA: NEGATIVE
RBC, urine, microscopic: NEGATIVE
WBC, Ur, HPF, POC: NEGATIVE

## 2011-11-23 LAB — POCT WET PREP WITH KOH
Clue Cells Wet Prep HPF POC: NEGATIVE
RBC Wet Prep HPF POC: NEGATIVE
Yeast Wet Prep HPF POC: NEGATIVE

## 2011-11-23 LAB — POCT URINALYSIS DIPSTICK
Bilirubin, UA: NEGATIVE
Blood, UA: NEGATIVE
Glucose, UA: NEGATIVE
Ketones, UA: NEGATIVE
Spec Grav, UA: 1.01
pH, UA: 5.5

## 2011-11-23 MED ORDER — TERCONAZOLE 0.4 % VA CREA: 1.0000 | TOPICAL_CREAM | Freq: Every day | VAGINAL | Status: AC

## 2011-11-23 NOTE — Progress Notes (Signed)
Reviewed and agree.

## 2011-11-23 NOTE — Patient Instructions (Addendum)
1. UTI (urinary tract infection)  POCT UA - Microscopic Only, POCT urinalysis dipstick, Urine culture  2. Vaginal itching  POCT Wet Prep with KOH  3. Urine frequency  Urine culture  4. Abdominal pressure  POCT Wet Prep with KOH

## 2011-11-23 NOTE — Progress Notes (Signed)
Subjective:    Patient ID: Lauren [name], female    DOB: Apr 12, 1940, 71 y.o.   MRN: [id number]  HPI This 71 y.o. female presents for evaluation of persistent UTI.  Evaluated 11/07/11 diagnosed with UTI; started on Macrobid and then switched to Penicillin.  Completed PCN 11/17/11.  Persistent itching and symptoms.  No fever/chills/sweats.  No nausea or vomiting.  No flank pain.  No dysuria or hematuria.  +lower abdominal pressure/pain.  +frequency.   Nocturia x 3-4 last night; baseline nocturia x 3.  Severe itching in vaginal area; bubbles in urine.  No vaginal discharge.  No vaginal redness.  Took Diflucan with onset 11/07/11; repeat 11/22/11.  Widowed; not dating.  Blood sugar this morning 119; fasting sugars usually range 120-130.    Review of Systems  Constitutional: Negative for fever, chills and fatigue.  Gastrointestinal: Negative for nausea, vomiting, abdominal pain and diarrhea.  Genitourinary: Positive for frequency and pelvic pain. Negative for dysuria, hematuria, flank pain, vaginal bleeding, vaginal discharge, genital sores and vaginal pain.    Past Medical History  Diagnosis Date  . Diabetes mellitus   . Hypertension   . Hyperlipidemia   . Hypothyroidism   . GERD (gastroesophageal reflux disease)   . Osteoporosis   . IBS (irritable bowel syndrome)   . Dyspepsia   . UTI (lower urinary tract infection)     Past Surgical History  Procedure Date  . None   . Colonoscopy     Prior to Admission medications   Medication Sig Start Date End Date Taking? Authorizing Provider  alendronate (FOSAMAX) 70 MG tablet Take 70 mg by mouth every 7 (seven) days. Take with a full glass of water on an empty stomach.    Yes Historical Provider, MD  aspirin 81 MG tablet Take 81 mg by mouth daily.     Yes Historical Provider, MD  atorvastatin (LIPITOR) 40 MG tablet Take 40 mg by mouth daily.   Yes Historical Provider, MD  fish oil-omega-3 fatty acids 1000 MG capsule Take 2 g by mouth daily.   Yes  Historical Provider, MD  glucosamine-chondroitin 500-400 MG tablet Take 1 tablet by mouth 3 (three) times daily.   Yes Historical Provider, MD  lansoprazole (PREVACID) 15 MG capsule Take 15 mg by mouth daily.   Yes Historical Provider, MD  levothyroxine (SYNTHROID, LEVOTHROID) 75 MCG tablet Take 75 mcg by mouth daily.     Yes Historical Provider, MD  losartan (COZAAR) 100 MG tablet Take 100 mg by mouth daily.     Yes Historical Provider, MD  metFORMIN (GLUCOPHAGE) 500 MG tablet Take 500 mg by mouth 2 (two) times daily with a meal.     Yes Historical Provider, MD  Multiple Vitamin (MULTIVITAMIN) tablet Take 1 tablet by mouth daily.   Yes Historical Provider, MD  omeprazole (PRILOSEC) 10 MG capsule Take 20 mg by mouth daily.   Yes Historical Provider, MD  ondansetron (ZOFRAN ODT) 4 MG disintegrating tablet Take 1 tab by mouth every 6 hours as needed for nausea. 01/25/11  Yes Beverley Fiedler, MD  pravastatin (PRAVACHOL) 40 MG tablet Take 40 mg by mouth daily.     Yes Historical Provider, MD  sitaGLIPtan-metformin (JANUMET) 50-500 MG per tablet Take 1 tablet by mouth 2 (two) times daily with a meal.     Yes Historical Provider, MD    Allergies  Allergen Reactions  . Esomeprazole Magnesium Nausea Only    Pain and nausea  . Pantoprazole Sodium Nausea Only  abd pain and nausea    History   Social History  . Marital Status: Widowed    Spouse Name: N/A    Number of Children: 3  . Years of Education: N/A   Occupational History  . retired    Social History Main Topics  . Smoking status: Never Smoker   . Smokeless tobacco: Never Used  . Alcohol Use: No  . Drug Use: No  . Sexually Active: No   Other Topics Concern  . Not on file   Social History Narrative  . No narrative on file    Family History  Problem Relation Age of Onset  . Colon cancer Neg Hx        Objective:   Physical Exam  Nursing note and vitals reviewed. Constitutional: She is oriented to person, place, and time.  She appears well-developed and well-nourished. No distress.  HENT:  Head: Normocephalic and atraumatic.  Eyes: Conjunctivae and EOM are normal. Pupils are equal, round, and reactive to light.  Cardiovascular: Normal rate, regular rhythm, normal heart sounds and intact distal pulses.   Pulmonary/Chest: Effort normal and breath sounds normal.  Abdominal: Soft. Bowel sounds are normal. She exhibits no distension and no mass. There is no hepatosplenomegaly. There is tenderness in the suprapubic area. There is no rebound, no guarding and no CVA tenderness.  Genitourinary: Vagina normal and uterus normal. There is no rash or lesion on the right labia. There is no rash or lesion on the left labia. Cervix exhibits no motion tenderness, no discharge and no friability. Right adnexum displays no mass and no tenderness. Left adnexum displays no mass and no tenderness. No vaginal discharge found.  Neurological: She is alert and oriented to person, place, and time.  Skin: She is not diaphoretic.  Psychiatric: She has a normal mood and affect. Her behavior is normal. Judgment and thought content normal.      Results for orders placed in visit on 11/23/11  POCT UA - MICROSCOPIC ONLY      Component Value Range   WBC, Ur, HPF, POC neg     RBC, urine, microscopic neg     Bacteria, U Microscopic neg     Mucus, UA neg     Epithelial cells, urine per micros 0-2     Crystals, Ur, HPF, POC neg     Casts, Ur, LPF, POC neg     Yeast, UA neg    POCT URINALYSIS DIPSTICK      Component Value Range   Color, UA yellow     Clarity, UA clear     Glucose, UA neg     Bilirubin, UA neg     Ketones, UA neg     Spec Grav, UA 1.010     Blood, UA neg     pH, UA 5.5     Protein, UA neg     Urobilinogen, UA 0.2     Nitrite, UA neg     Leukocytes, UA Negative    POCT WET PREP WITH KOH      Component Value Range   Trichomonas, UA Negative     Clue Cells Wet Prep HPF POC neg     Epithelial Wet Prep HPF POC 1-3      Yeast Wet Prep HPF POC neg     Bacteria Wet Prep HPF POC small     RBC Wet Prep HPF POC neg     WBC Wet Prep HPF POC 4-6     KOH  Prep POC Negative         Assessment & Plan:   1. UTI (urinary tract infection)  POCT UA - Microscopic Only, POCT urinalysis dipstick, Urine culture  2. Vaginal itching  POCT Wet Prep with KOH  3. Urine frequency  Urine culture  4. Abdominal pressure  POCT Wet Prep with KOH    1.  UTI:  Persistent symptoms; s/p Penicillin therapy for Strep UTI.  U/a benign in office; send urine culture to confirm resolution. 2.  Vaginal itching: New.  Benign exam.  S/p Diflucan x 2.  Rx for Terazol cream to apply qhs x 7 days.  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   3. Urinary Frequency: Persistent despite treatment for UTI.  Treat with Terazol; if frequency persists, consider trial of topical estrogen cream. 4.  DMII: controlled.  Glucose this morning of 119.

## 2011-11-23 NOTE — Progress Notes (Deleted)
  Subjective:    Patient ID: Lauren [name], female    DOB: 01/20/1941, 71 y.o.   MRN: [id number]  HPI    Review of Systems     Objective:   Physical Exam        Assessment & Plan:

## 2011-12-17 ENCOUNTER — Telehealth: Payer: Self-pay

## 2011-12-17 ENCOUNTER — Ambulatory Visit (INDEPENDENT_AMBULATORY_CARE_PROVIDER_SITE_OTHER): Payer: Medicare Other | Admitting: Family Medicine

## 2011-12-17 VITALS — BP 128/56 | HR 83 | Temp 97.9°F | Resp 16 | Ht 59.0 in | Wt 118.0 lb

## 2011-12-17 DIAGNOSIS — N390 Urinary tract infection, site not specified: Secondary | ICD-10-CM

## 2011-12-17 DIAGNOSIS — R350 Frequency of micturition: Secondary | ICD-10-CM

## 2011-12-17 LAB — POCT UA - MICROSCOPIC ONLY
Bacteria, U Microscopic: NEGATIVE
Casts, Ur, LPF, POC: NEGATIVE
Mucus, UA: NEGATIVE

## 2011-12-17 LAB — POCT URINALYSIS DIPSTICK
Bilirubin, UA: NEGATIVE
Ketones, UA: NEGATIVE
pH, UA: 7

## 2011-12-17 MED ORDER — AMOXICILLIN 500 MG PO CAPS: 500.0000 mg | ORAL_CAPSULE | Freq: Three times a day (TID) | ORAL | Status: DC

## 2011-12-17 MED ORDER — PHENAZOPYRIDINE HCL 100 MG PO TABS: 100.0000 mg | ORAL_TABLET | Freq: Three times a day (TID) | ORAL | Status: DC | PRN

## 2011-12-17 NOTE — Telephone Encounter (Signed)
Given the new symptoms, patient should RTC.

## 2011-12-17 NOTE — Telephone Encounter (Signed)
Pt in office to be seen now.

## 2011-12-17 NOTE — Telephone Encounter (Signed)
Dr Michaelle Copas OV note stated if Sxs didn't resolve pt may need to be tx'd w/estrogen cream. I didn't know whether someone wanted to Rx this or have pt RTC?

## 2011-12-17 NOTE — Telephone Encounter (Signed)
Ms. Lauren [name] called for patient to relay message that the patient is still having a lot of urinary frequency, urgency and cloudy urine.  The patient is also experiencing vaginal yeast infection symptoms per patient's friend/interpreter.  Please call [phone] to discuss options.

## 2011-12-17 NOTE — Progress Notes (Signed)
Subjective:    Patient ID: Lauren [name], female    DOB: May 31, 1940, 71 y.o.   MRN: [id number]  HPI Lauren [name] is a 71 y.o. female See prior ov's.   Here with interpreter. Frequency, urgency, cloudy urine, dysuria. Past 2-3 days.no fever, N/V or back pain.  Tx: none. Drinking fluids.   11/12/11 uti - strep on cx , likely s bovis.  Changed form macrobid to pcn 250tid for 5 days. Follow up cx negative on 11/23/11.  Review of Systems  Constitutional: Negative for fever and chills.  Gastrointestinal: Positive for abdominal pain. Negative for nausea and vomiting.       Lower abdomen/bladder area  Genitourinary: Positive for dysuria, urgency, frequency and difficulty urinating (trouble starting at times.). Negative for hematuria, vaginal bleeding, vaginal discharge, vaginal pain, menstrual problem and pelvic pain.  Musculoskeletal: Negative for back pain.       Objective:   Physical Exam  Constitutional: She is oriented to person, place, and time. She appears well-developed and well-nourished.  HENT:  Head: Normocephalic and atraumatic.  Pulmonary/Chest: Effort normal.  Abdominal: Soft. Normal appearance. She exhibits no distension. There is no tenderness. There is no rebound, no guarding and no CVA tenderness.  Neurological: She is alert and oriented to person, place, and time.  Skin: Skin is warm.  Psychiatric: She has a normal mood and affect. Her behavior is normal.      Results for orders placed in visit on 12/17/11  POCT URINALYSIS DIPSTICK      Component Value Range   Color, UA yellow     Clarity, UA cloudy     Glucose, UA negative     Bilirubin, UA negative     Ketones, UA negative     Spec Grav, UA 1.015     Blood, UA moderate     pH, UA 7.0     Protein, UA trace     Urobilinogen, UA 0.2     Nitrite, UA positive     Leukocytes, UA large (3+)    POCT UA - MICROSCOPIC ONLY      Component Value Range   WBC, Ur, HPF, POC TNTC     RBC, urine, microscopic TNTC     Bacteria, U Microscopic negative     Mucus, UA negative     Epithelial cells, urine per micros negative     Crystals, Ur, HPF, POC negative     Casts, Ur, LPF, POC negative     Yeast, UA negative        Assessment & Plan:   Lauren [name] is a 71 y.o. female 1. Urine frequency  POCT urinalysis dipstick, POCT UA - Microscopic Only, Urine culture, amoxicillin (AMOXIL) 500 MG capsule, phenazopyridine (PYRIDIUM) 100 MG tablet  2. UTI (lower urinary tract infection)  Urine culture, amoxicillin (AMOXIL) 500 MG capsule, phenazopyridine (PYRIDIUM) 100 MG tablet   recurrence of uti after recent treatment. Will treat with amox as probable strep uti again, but urine cx rechecked.  Recheck ov in 2 weeks. rtc precautions given.  [employer] barrier. [employer] member translating.   Patient Instructions  Start the amoxicillin for the infection.  Pyridium only if needed for next 2-3 days.  Your urine culture results will be available in the next 5-7 days.  Recheck office visit in 10-14 days. Return to the clinic or go to the nearest emergency room if any of your symptoms worsen or new symptoms occur. Urinary Tract Infection Urinary tract infections (UTIs) can develop anywhere along your  urinary tract. Your urinary tract is your body's drainage system for removing wastes and extra water. Your urinary tract includes two kidneys, two ureters, a bladder, and a urethra. Your kidneys are a pair of bean-shaped organs. Each kidney is about the size of your fist. They are located below your ribs, one on each side of your spine. CAUSES Infections are caused by microbes, which are microscopic organisms, including fungi, viruses, and bacteria. These organisms are so small that they can only be seen through a microscope. Bacteria are the microbes that most commonly cause UTIs. SYMPTOMS  Symptoms of UTIs may vary by age and gender of the patient and by the location of the infection. Symptoms in young women typically include a frequent  and intense urge to urinate and a painful, burning feeling in the bladder or urethra during urination. Older women and men are more likely to be tired, shaky, and weak and have muscle aches and abdominal pain. A fever may mean the infection is in your kidneys. Other symptoms of a kidney infection include pain in your back or sides below the ribs, nausea, and vomiting. DIAGNOSIS To diagnose a UTI, your caregiver will ask you about your symptoms. Your caregiver also will ask to provide a urine sample. The urine sample will be tested for bacteria and white blood cells. White blood cells are made by your body to help fight infection. TREATMENT  Typically, UTIs can be treated with medication. Because most UTIs are caused by a bacterial infection, they usually can be treated with the use of antibiotics. The choice of antibiotic and length of treatment depend on your symptoms and the type of bacteria causing your infection. HOME CARE INSTRUCTIONS  If you were prescribed antibiotics, take them exactly as your caregiver instructs you. Finish the medication even if you feel better after you have only taken some of the medication.  Drink enough water and fluids to keep your urine clear or pale yellow.  Avoid caffeine, tea, and carbonated beverages. They tend to irritate your bladder.  Empty your bladder often. Avoid holding urine for long periods of time.  Empty your bladder before and after sexual intercourse.  After a bowel movement, women should cleanse from front to back. Use each tissue only once. SEEK MEDICAL CARE IF:   You have back pain.  You develop a fever.  Your symptoms do not begin to resolve within 3 days. SEEK IMMEDIATE MEDICAL CARE IF:   You have severe back pain or lower abdominal pain.  You develop chills.  You have nausea or vomiting.  You have continued burning or discomfort with urination. MAKE SURE YOU:   Understand these instructions.  Will watch your  condition.  Will get help right away if you are not doing well or get worse. Document Released: 12/09/2004 Document Revised: 08/31/2011 Document Reviewed: 04/09/2011 [facility] Patient Information 2013 [place], [place].

## 2011-12-17 NOTE — Patient Instructions (Signed)
Start the amoxicillin for the infection.  Pyridium only if needed for next 2-3 days.  Your urine culture results will be available in the next 5-7 days.  Recheck office visit in 10-14 days. Return to the clinic or go to the nearest emergency room if any of your symptoms worsen or new symptoms occur. Urinary Tract Infection Urinary tract infections (UTIs) can develop anywhere along your urinary tract. Your urinary tract is your body's drainage system for removing wastes and extra water. Your urinary tract includes two kidneys, two ureters, a bladder, and a urethra. Your kidneys are a pair of bean-shaped organs. Each kidney is about the size of your fist. They are located below your ribs, one on each side of your spine. CAUSES Infections are caused by microbes, which are microscopic organisms, including fungi, viruses, and bacteria. These organisms are so small that they can only be seen through a microscope. Bacteria are the microbes that most commonly cause UTIs. SYMPTOMS  Symptoms of UTIs may vary by age and gender of the patient and by the location of the infection. Symptoms in young women typically include a frequent and intense urge to urinate and a painful, burning feeling in the bladder or urethra during urination. Older women and men are more likely to be tired, shaky, and weak and have muscle aches and abdominal pain. A fever may mean the infection is in your kidneys. Other symptoms of a kidney infection include pain in your back or sides below the ribs, nausea, and vomiting. DIAGNOSIS To diagnose a UTI, your caregiver will ask you about your symptoms. Your caregiver also will ask to provide a urine sample. The urine sample will be tested for bacteria and white blood cells. White blood cells are made by your body to help fight infection. TREATMENT  Typically, UTIs can be treated with medication. Because most UTIs are caused by a bacterial infection, they usually can be treated with the use of  antibiotics. The choice of antibiotic and length of treatment depend on your symptoms and the type of bacteria causing your infection. HOME CARE INSTRUCTIONS  If you were prescribed antibiotics, take them exactly as your caregiver instructs you. Finish the medication even if you feel better after you have only taken some of the medication.  Drink enough water and fluids to keep your urine clear or pale yellow.  Avoid caffeine, tea, and carbonated beverages. They tend to irritate your bladder.  Empty your bladder often. Avoid holding urine for long periods of time.  Empty your bladder before and after sexual intercourse.  After a bowel movement, women should cleanse from front to back. Use each tissue only once. SEEK MEDICAL CARE IF:   You have back pain.  You develop a fever.  Your symptoms do not begin to resolve within 3 days. SEEK IMMEDIATE MEDICAL CARE IF:   You have severe back pain or lower abdominal pain.  You develop chills.  You have nausea or vomiting.  You have continued burning or discomfort with urination. MAKE SURE YOU:   Understand these instructions.  Will watch your condition.  Will get help right away if you are not doing well or get worse. Document Released: 12/09/2004 Document Revised: 08/31/2011 Document Reviewed: 04/09/2011 [facility] Patient Information 2013 [place], [place].

## 2011-12-22 LAB — URINE CULTURE

## 2011-12-23 ENCOUNTER — Other Ambulatory Visit: Payer: Self-pay | Admitting: Radiology

## 2011-12-23 MED ORDER — CIPROFLOXACIN HCL 250 MG PO TABS: 250.0000 mg | ORAL_TABLET | Freq: Two times a day (BID) | ORAL | Status: DC

## 2011-12-31 ENCOUNTER — Ambulatory Visit (INDEPENDENT_AMBULATORY_CARE_PROVIDER_SITE_OTHER): Payer: Medicare Other | Admitting: Family Medicine

## 2011-12-31 ENCOUNTER — Encounter: Payer: Self-pay | Admitting: Family Medicine

## 2011-12-31 VITALS — BP 136/78 | HR 74 | Temp 99.0°F | Resp 17 | Ht 59.0 in | Wt 117.0 lb

## 2011-12-31 DIAGNOSIS — N390 Urinary tract infection, site not specified: Secondary | ICD-10-CM

## 2011-12-31 DIAGNOSIS — Z09 Encounter for follow-up examination after completed treatment for conditions other than malignant neoplasm: Secondary | ICD-10-CM

## 2011-12-31 LAB — POCT URINALYSIS DIPSTICK
Bilirubin, UA: NEGATIVE
Blood, UA: NEGATIVE
Glucose, UA: NEGATIVE
Ketones, UA: NEGATIVE
Nitrite, UA: NEGATIVE
Protein, UA: NEGATIVE
Spec Grav, UA: 1.01
Urobilinogen, UA: 0.2
pH, UA: 7

## 2011-12-31 LAB — POCT UA - MICROSCOPIC ONLY
Casts, Ur, LPF, POC: NEGATIVE
Crystals, Ur, HPF, POC: NEGATIVE
Mucus, UA: NEGATIVE
Yeast, UA: NEGATIVE

## 2011-12-31 NOTE — Progress Notes (Signed)
Subjective:    Patient ID: Lauren [name], female    DOB: [birth year], 71 y.o.   MRN: [id number]  HPI Lauren [name] is a 71 y.o. female See prior ov's.   Here with interpreter. 12/17/11 office visit: Frequency, urgency, cloudy urine, dysuria 2-3 days.no fever, N/V or back pain.  11/12/11 uti - strep on cx , likely s bovis.  Changed form macrobid to pcn 250tid for 5 days. Follow up cx negative on 11/23/11.  Treated initially with amox 500mg  tid for 10 days, then with culture results - enterococcus and staphylococcus with staph resistant to pcn - added cipro 250 mg tid for 3 days on 12/23/11.   Feeling fine today.  No further urgency/dysuria/frequency. No abd pain, no back pain. Does have to push out urine, but able to urinate.    Primary care: Dr. Ricki Miller - next appt. In next 2 months.       Review of Systems  Constitutional: Negative for fever and chills.  Gastrointestinal: Negative for nausea, vomiting and abdominal pain.  Genitourinary: Negative for dysuria, urgency, frequency, hematuria and pelvic pain.  Musculoskeletal: Negative for back pain.       Objective:   Physical Exam  Constitutional: She is oriented to person, place, and time. She appears well-developed and well-nourished.  HENT:  Head: Normocephalic and atraumatic.  Pulmonary/Chest: Effort normal.  Abdominal: Soft. Normal appearance. She exhibits no distension. There is no tenderness. There is no rebound, no guarding and no CVA tenderness.  Neurological: She is alert and oriented to person, place, and time.  Skin: Skin is warm.  Psychiatric: She has a normal mood and affect. Her behavior is normal.      Results for orders placed in visit on 12/31/11  POCT UA - MICROSCOPIC ONLY      Component Value Range   WBC, Ur, HPF, POC 0-2     RBC, urine, microscopic 0-1     Bacteria, U Microscopic trace     Mucus, UA neg     Epithelial cells, urine per micros 0-1     Crystals, Ur, HPF, POC neg     Casts, Ur, LPF, POC neg     Yeast, UA neg    POCT URINALYSIS DIPSTICK      Component Value Range   Color, UA yellow     Clarity, UA clear     Glucose, UA neg     Bilirubin, UA neg     Ketones, UA neg     Spec Grav, UA 1.010     Blood, UA neg     pH, UA 7.0     Protein, UA neg     Urobilinogen, UA 0.2     Nitrite, UA neg     Leukocytes, UA Trace         Assessment & Plan:  Lauren [name] is a 71 y.o. female 1. Follow up  POCT UA - Microscopic Only, POCT urinalysis dipstick  2. UTI (urinary tract infection)  POCT UA - Microscopic Only, POCT urinalysis dipstick, Urine culture    UTI - symptomatically improved.  U/a improved. Recheck urine culture - rtc if sx's recur.   Episodic sensation of straining with urination, without true retention.  Follow up with primary care provider to discuss, rtc or er if unable to void. Understanding expressed (interpreter present).  Patient Instructions  Your urine infection appears to have resolved, but we will check another culture today.  Follow up with your primary care provider about the sensation of  having to strain to urinate. Return to the clinic or go to the nearest emergency room if any of your symptoms worsen or new symptoms occur.

## 2011-12-31 NOTE — Patient Instructions (Signed)
Your urine infection appears to have resolved, but we will check another culture today.  Follow up with your primary care provider about the sensation of having to strain to urinate. Return to the clinic or go to the nearest emergency room if any of your symptoms worsen or new symptoms occur.

## 2012-01-01 LAB — URINE CULTURE: Colony Count: 15000

## 2013-12-18 ENCOUNTER — Ambulatory Visit: Payer: Medicare Other

## 2014-01-14 ENCOUNTER — Encounter: Payer: Self-pay | Admitting: Family Medicine

## 2014-03-18 DIAGNOSIS — Z1389 Encounter for screening for other disorder: Secondary | ICD-10-CM | POA: Diagnosis not present

## 2014-03-18 DIAGNOSIS — I10 Essential (primary) hypertension: Secondary | ICD-10-CM | POA: Diagnosis not present

## 2014-03-18 DIAGNOSIS — N390 Urinary tract infection, site not specified: Secondary | ICD-10-CM | POA: Diagnosis not present

## 2014-03-18 DIAGNOSIS — Z0000 Encounter for general adult medical examination without abnormal findings: Secondary | ICD-10-CM | POA: Diagnosis not present

## 2014-03-27 DIAGNOSIS — N390 Urinary tract infection, site not specified: Secondary | ICD-10-CM | POA: Diagnosis not present

## 2014-04-03 DIAGNOSIS — R300 Dysuria: Secondary | ICD-10-CM | POA: Diagnosis not present

## 2014-04-11 DIAGNOSIS — R300 Dysuria: Secondary | ICD-10-CM | POA: Diagnosis not present

## 2014-05-16 DIAGNOSIS — R102 Pelvic and perineal pain: Secondary | ICD-10-CM | POA: Diagnosis not present

## 2014-05-16 DIAGNOSIS — R300 Dysuria: Secondary | ICD-10-CM | POA: Diagnosis not present

## 2014-06-05 DIAGNOSIS — I10 Essential (primary) hypertension: Secondary | ICD-10-CM | POA: Diagnosis not present

## 2014-06-05 DIAGNOSIS — M810 Age-related osteoporosis without current pathological fracture: Secondary | ICD-10-CM | POA: Diagnosis not present

## 2014-06-05 DIAGNOSIS — E1165 Type 2 diabetes mellitus with hyperglycemia: Secondary | ICD-10-CM | POA: Diagnosis not present

## 2014-06-11 DIAGNOSIS — E1165 Type 2 diabetes mellitus with hyperglycemia: Secondary | ICD-10-CM | POA: Diagnosis not present

## 2014-06-11 DIAGNOSIS — Z23 Encounter for immunization: Secondary | ICD-10-CM | POA: Diagnosis not present

## 2014-06-11 DIAGNOSIS — E780 Pure hypercholesterolemia: Secondary | ICD-10-CM | POA: Diagnosis not present

## 2014-06-11 DIAGNOSIS — Z0000 Encounter for general adult medical examination without abnormal findings: Secondary | ICD-10-CM | POA: Diagnosis not present

## 2014-06-11 DIAGNOSIS — I10 Essential (primary) hypertension: Secondary | ICD-10-CM | POA: Diagnosis not present

## 2014-06-13 DIAGNOSIS — R102 Pelvic and perineal pain: Secondary | ICD-10-CM | POA: Diagnosis not present

## 2014-07-16 DIAGNOSIS — R252 Cramp and spasm: Secondary | ICD-10-CM | POA: Diagnosis not present

## 2014-07-18 DIAGNOSIS — N390 Urinary tract infection, site not specified: Secondary | ICD-10-CM | POA: Diagnosis not present

## 2014-07-18 DIAGNOSIS — N3000 Acute cystitis without hematuria: Secondary | ICD-10-CM | POA: Diagnosis not present

## 2014-07-31 DIAGNOSIS — N390 Urinary tract infection, site not specified: Secondary | ICD-10-CM | POA: Diagnosis not present

## 2014-07-31 DIAGNOSIS — B9620 Unspecified Escherichia coli [E. coli] as the cause of diseases classified elsewhere: Secondary | ICD-10-CM | POA: Diagnosis not present

## 2014-12-12 ENCOUNTER — Encounter: Payer: Self-pay | Admitting: Gastroenterology

## 2014-12-12 ENCOUNTER — Other Ambulatory Visit: Payer: Self-pay | Admitting: Internal Medicine

## 2014-12-12 DIAGNOSIS — G4453 Primary thunderclap headache: Secondary | ICD-10-CM

## 2014-12-13 ENCOUNTER — Ambulatory Visit
Admission: RE | Admit: 2014-12-13 | Discharge: 2014-12-13 | Disposition: A | Discharge status: Home / Self Care | Payer: Medicare Other | Source: Ambulatory Visit | Attending: Internal Medicine | Admitting: Internal Medicine

## 2014-12-13 DIAGNOSIS — G4453 Primary thunderclap headache: Secondary | ICD-10-CM

## 2014-12-14 ENCOUNTER — Encounter: Payer: Self-pay | Admitting: *Deleted

## 2014-12-14 DIAGNOSIS — Z0000 Encounter for general adult medical examination without abnormal findings: Secondary | ICD-10-CM

## 2014-12-14 NOTE — Congregational Nurse Program (Unsigned)
Late entry for 12/13/2014 D; CT of Head done per order. At [address] [facility].      Need ride and translate due to language barrier.      Stopped by pharmacy to get Vit.D3  flu shot received @ Dr. Maudie Mercury clinic

## 2014-12-14 NOTE — Patient Instructions (Signed)
Late entry for 12/12/2014 D; accompanied with pt. To visit clinic Dr. Jani Gravel GMA on 12/12/14 for regular check up       Continue to take Pravachol due to lipids panel result       Increased Prilosec 40mg  BID ac breakfast 7 supper due to c/o nauseated after meal.check with Dr.Pyrtle 01/01/2015 also reportedepisode of incontinent stool without notice.      C/o pounding headach 2weeks then weird feeling neck to head Rt.side-> x-ray of head at clinic, WNL, CT of Head ordered also Robaxin ordered if it helps for HA       Flu shot received @ clinic       A; unable to get Robaxin due to insurance not cover, so [facility] sent note to MD to confirm, will call me when meds ready to pick up  Spent time 3hrs and 41miles traveled

## 2014-12-27 ENCOUNTER — Encounter: Payer: Self-pay | Admitting: Gastroenterology

## 2014-12-28 ENCOUNTER — Encounter: Payer: Self-pay | Admitting: *Deleted

## 2014-12-28 DIAGNOSIS — Z0000 Encounter for general adult medical examination without abnormal findings: Secondary | ICD-10-CM

## 2014-12-28 NOTE — Congregational Nurse Program (Signed)
Late entry for 12/27/2014 09:30Am Pt Visit to check Wt. Reported cholesterol meds received, but no muscle relaxer received yet. No more HA reported.

## 2015-01-01 ENCOUNTER — Ambulatory Visit (INDEPENDENT_AMBULATORY_CARE_PROVIDER_SITE_OTHER): Payer: Medicare Other | Admitting: Gastroenterology

## 2015-01-01 ENCOUNTER — Encounter: Payer: Self-pay | Admitting: Gastroenterology

## 2015-01-01 ENCOUNTER — Other Ambulatory Visit (INDEPENDENT_AMBULATORY_CARE_PROVIDER_SITE_OTHER): Payer: Medicare Other

## 2015-01-01 VITALS — BP 100/60 | HR 66 | Ht 58.5 in | Wt 113.8 lb

## 2015-01-01 DIAGNOSIS — R1033 Periumbilical pain: Secondary | ICD-10-CM

## 2015-01-01 DIAGNOSIS — R159 Full incontinence of feces: Secondary | ICD-10-CM

## 2015-01-01 DIAGNOSIS — R110 Nausea: Secondary | ICD-10-CM

## 2015-01-01 LAB — BASIC METABOLIC PANEL
BUN: 14 mg/dL (ref 6–23)
CALCIUM: 9.5 mg/dL (ref 8.4–10.5)
CO2: 29 meq/L (ref 19–32)
CREATININE: 0.63 mg/dL (ref 0.40–1.20)
Chloride: 104 mEq/L (ref 96–112)
GFR: 97.65 mL/min (ref >60.00)
GLUCOSE: 121 mg/dL — AB (ref 70–99)
Potassium: 4.1 mEq/L (ref 3.5–5.1)
Sodium: 140 mEq/L (ref 135–145)

## 2015-01-01 NOTE — Patient Instructions (Addendum)
Please go to the basement level to have your labs drawn.    You have been scheduled for a CT scan of the abdomen and pelvis at [facility] ([address]---this is in the same building as [facility]).   You are scheduled on Monday 01-07-2015 at 9:00 PM . You should arrive 8:45 AM  to your appointment time for registration. Please follow the written instructions below on the day of your exam:  WARNING: IF YOU ARE ALLERGIC TO IODINE/X-RAY DYE, PLEASE NOTIFY RADIOLOGY IMMEDIATELY AT [id number]! YOU WILL BE GIVEN A 13 HOUR PREMEDICATION PREP.  1) Do not eat or drink anything after 12:00 PM  (4 hours prior to your test) 2) You have been given 2 bottles of oral contrast to drink. The solution may taste better if refrigerated, but do NOT add ice or any other liquid to this solution. Shake well before drinking.    Drink 1 bottle of contrast @  7:00 AM  (2 hours prior to your exam)  Drink 1 bottle of contrast @ 8:00 AM  (1 hour prior to your exam)  You may take any medications as prescribed with a small amount of water except for the following: Metformin, Glucophage, Glucovance, Avandamet, Riomet, Fortamet, Actoplus Met, Janumet, Glumetza or Metaglip. The above medications must be held the day of the exam AND 48 hours after the exam.  The purpose of you drinking the oral contrast is to aid in the visualization of your intestinal tract. The contrast solution may cause some diarrhea. Before your exam is started, you will be given a small amount of fluid to drink. Depending on your individual set of symptoms, you may also receive an intravenous injection of x-ray contrast/dye. Plan on being at [facility] for 30 minutes or long, depending on the type of exam you are having performed.  If you have any questions regarding your exam or if you need to reschedule, you may call the CT department at [phone] between the hours of 8:00 am and 5:00 pm,  Monday-Friday.  ________________________________________________________________________

## 2015-01-03 ENCOUNTER — Encounter: Payer: Self-pay | Admitting: Gastroenterology

## 2015-01-03 DIAGNOSIS — R1033 Periumbilical pain: Secondary | ICD-10-CM | POA: Insufficient documentation

## 2015-01-03 DIAGNOSIS — R159 Full incontinence of feces: Secondary | ICD-10-CM | POA: Insufficient documentation

## 2015-01-03 DIAGNOSIS — R110 Nausea: Secondary | ICD-10-CM | POA: Insufficient documentation

## 2015-01-03 NOTE — Progress Notes (Addendum)
01/03/2015 Lauren [name] [id number] [birth year]   HISTORY OF PRESENT ILLNESS:  This is a 74 year old Asian female who is previously known to Dr. Hilarie Fredrickson. She is here today with a relative who provides translation, but even with this it was difficult to obtain a lot of detail regarding symptoms. Referred back here by her PCP, Dr. Maudie Mercury.  She comes in with complaints of nausea for the past several months. She is on omeprazole 40 mg twice daily, this was increased from once daily by Dr. Maudie Mercury recently. She also complains of a knot at the top of her bellybutton that she has never noticed until recently. Also complains of some incontinent stools at times. It appears that she's had complaints of nausea in the past as well. She had colonoscopy in February 2013 at which time she was found to have mild diverticulosis, internal hemorrhoids, and decreased anal sphincter tone. She had an EGD in November 2012 which showed a small hiatal hernia and mild gastritis; H. pylori was negative.  Just of note, she did have a CT scan of the head without contrast recently at the end of September for evaluation of headaches, which only showed mild cerebral atrophy and chronic small vessel disease.   Past Medical History  Diagnosis Date  . Diabetes mellitus   . Hypertension   . Hyperlipidemia   . Hypothyroidism   . GERD (gastroesophageal reflux disease)   . Osteoporosis   . IBS (irritable bowel syndrome)   . Dyspepsia   . UTI (lower urinary tract infection)   . Diverticulosis   . Hemorrhoids   . Vitamin D deficiency    Past Surgical History  Procedure Laterality Date  . Colonoscopy      reports that she has never smoked. She has never used smokeless tobacco. She reports that she does not drink alcohol or use illicit drugs. family history is negative for Colon cancer. Allergies  Allergen Reactions  . Esomeprazole Magnesium Nausea Only    Pain and nausea  . Macrodantin [Nitrofurantoin Macrocrystal] Other (See  Comments)    GI Upset  . Pantoprazole Sodium Nausea Only    abd pain and nausea      Outpatient Encounter Prescriptions as of 01/01/2015  Medication Sig  . alendronate (FOSAMAX) 70 MG tablet Take 70 mg by mouth every 7 (seven) days. Take with a full glass of water on an empty stomach.   Marland Kitchen aspirin 81 MG tablet Take 81 mg by mouth daily.    . Cholecalciferol (VITAMIN D3) 2000 UNITS TABS Take 1 tablet by mouth daily.  Marland Kitchen glucosamine-chondroitin 500-400 MG tablet Take 1 tablet by mouth 3 (three) times daily.  Marland Kitchen levothyroxine (SYNTHROID, LEVOTHROID) 75 MCG tablet Take 75 mcg by mouth daily.    Marland Kitchen losartan (COZAAR) 100 MG tablet Take 100 mg by mouth daily.    . Magnesium 400 MG CAPS Take 1 capsule by mouth daily.  . mirabegron ER (MYRBETRIQ) 25 MG TB24 tablet Take 25 mg by mouth daily.  . Multiple Vitamin (MULTIVITAMIN) tablet Take 1 tablet by mouth daily.  Marland Kitchen omeprazole (PRILOSEC) 40 MG capsule Take 40 mg by mouth 2 (two) times daily.   . phenazopyridine (PYRIDIUM) 100 MG tablet Take 1 tablet (100 mg total) by mouth 3 (three) times daily as needed for pain.  . pravastatin (PRAVACHOL) 40 MG tablet Take 40 mg by mouth daily.    . sitaGLIPtan-metformin (JANUMET) 50-500 MG per tablet Take 1 tablet by mouth 2 (two) times daily with  a meal.    . vitamin B-12 (CYANOCOBALAMIN) 100 MCG tablet Take 100 mcg by mouth daily.  . [DISCONTINUED] amoxicillin (AMOXIL) 500 MG capsule Take 1 capsule (500 mg total) by mouth 3 (three) times daily.  . [DISCONTINUED] atorvastatin (LIPITOR) 40 MG tablet Take 40 mg by mouth daily.  . [DISCONTINUED] ciprofloxacin (CIPRO) 250 MG tablet Take 1 tablet (250 mg total) by mouth 2 (two) times daily.  . [DISCONTINUED] lansoprazole (PREVACID) 15 MG capsule Take 15 mg by mouth daily.  . [DISCONTINUED] metFORMIN (GLUCOPHAGE) 500 MG tablet Take 500 mg by mouth 2 (two) times daily with a meal.    . [DISCONTINUED] ondansetron (ZOFRAN ODT) 4 MG disintegrating tablet Take 1 tab by mouth  every 6 hours as needed for nausea.   No facility-administered encounter medications on file as of 01/01/2015.     REVIEW OF SYSTEMS  : All other systems reviewed and negative except where noted in the History of Present Illness.   PHYSICAL EXAM: BP 100/60 mmHg  Pulse 66  Ht 4' 10.5" (1.486 m)  Wt 113 lb 12.8 oz (51.619 kg)  BMI 23.38 kg/m2 General: Well developed Asian female in no acute distress Head: Normocephalic and atraumatic Eyes:  Sclerae anicteric, conjunctiva pink. Ears: Normal auditory acuity Lungs: Clear throughout to auscultation Heart: Regular rate and rhythm Abdomen: Soft, non-distended.  Normal bowel sounds.  Mild mid-abdominal TTP.  Area at the top of the umbilicus seems to be just superficial hypertrophic tissue rather a knot or mass. Musculoskeletal: Symmetrical with no gross deformities  Skin: No lesions on visible extremities Extremities: No edema  Neurological: Alert oriented x 4, grossly non-focal Psychological:  Alert and cooperative. Normal mood and affect  ASSESSMENT AND PLAN: -Recurrent nausea:  Recent CT head negative.  On PPI BID.  Also with peri-umbilical/mid-abdominal discomfort and a "knot" at the top of the umbilicus.  This area of concern is quite superficial and I think that it may just be hypertrophic tissue rather than mass, etc.  Will check CT scan of the abdomen and pelvis with contrast to rule out any malignancy and other causes or nausea, etc. -Fecal incontinence:  Had decreased sphincter tone noted at last colonoscopy.  Will have her try Metamucil daily to see if this helps to bulk and form her stools.  ? Need for anorectal manometry and/or pelvic floor therapy.  CC:  Tommy Medal, MD   Addendum: Reviewed and agree with initial management.  Pt will need office follow-up to assess response of fiber therapy.  Anorectal mano is a good option for persistent symptoms. Jerene Bears, MD

## 2015-01-04 ENCOUNTER — Encounter: Payer: Self-pay | Admitting: *Deleted

## 2015-01-04 DIAGNOSIS — R110 Nausea: Secondary | ICD-10-CM

## 2015-01-04 NOTE — Congregational Nurse Program (Signed)
Congregational Nurse Program Note  Date of Encounter: 01/01/2015  Past Medical History: Past Medical History  Diagnosis Date  . Diabetes mellitus   . Hypertension   . Hyperlipidemia   . Hypothyroidism   . GERD (gastroesophageal reflux disease)   . Osteoporosis   . IBS (irritable bowel syndrome)   . Dyspepsia   . UTI (lower urinary tract infection)   . Diverticulosis   . Hemorrhoids   . Vitamin D deficiency     Encounter Details:     CNP Questionnaire - 12/27/14 0930    Patient Demographics   Is this a new or existing patient? Existing   Patient is considered a/an Immigrant   Patient Assistance   Patient referred to apply for the following financial assistance Not Applicable   Food insecurities addressed Not Applicable   Transportation assistance No   Assistance securing medications Yes   Educational health offerings Medications;Other (comment)     01/01/2015 Pt visit Dr.Pyrtle PA  Will check CT abd on 01/07/2015 due to questionable umblical hernia, incontinent stool, remain sx. Of gastritis. ?delaying empty syndrome due to DM.

## 2015-01-06 ENCOUNTER — Other Ambulatory Visit: Payer: Medicare Other

## 2015-01-07 ENCOUNTER — Ambulatory Visit (INDEPENDENT_AMBULATORY_CARE_PROVIDER_SITE_OTHER)
Admission: RE | Admit: 2015-01-07 | Discharge: 2015-01-07 | Disposition: A | Discharge status: Home / Self Care | Payer: Medicare Other | Source: Ambulatory Visit | Attending: Gastroenterology | Admitting: Gastroenterology

## 2015-01-07 ENCOUNTER — Encounter: Payer: Self-pay | Admitting: *Deleted

## 2015-01-07 DIAGNOSIS — R159 Full incontinence of feces: Secondary | ICD-10-CM | POA: Diagnosis not present

## 2015-01-07 DIAGNOSIS — R1033 Periumbilical pain: Secondary | ICD-10-CM | POA: Diagnosis not present

## 2015-01-07 DIAGNOSIS — R110 Nausea: Secondary | ICD-10-CM | POA: Diagnosis not present

## 2015-01-07 DIAGNOSIS — R109 Unspecified abdominal pain: Secondary | ICD-10-CM

## 2015-01-07 MED ORDER — IOHEXOL 300 MG/ML  SOLN
100.0000 mL | Freq: Once | INTRAMUSCULAR | Status: AC | PRN
  Administered 2015-01-07: 100 mL via INTRAVENOUS

## 2015-01-08 ENCOUNTER — Encounter: Payer: Self-pay | Admitting: *Deleted

## 2015-01-08 DIAGNOSIS — R110 Nausea: Secondary | ICD-10-CM

## 2015-01-08 NOTE — Congregational Nurse Program (Signed)
Congregational Nurse Program Note  Date of Encounter: 01/08/2015  Past Medical History: Past Medical History  Diagnosis Date  . Diabetes mellitus   . Hypertension   . Hyperlipidemia   . Hypothyroidism   . GERD (gastroesophageal reflux disease)   . Osteoporosis   . IBS (irritable bowel syndrome)   . Dyspepsia   . UTI (lower urinary tract infection)   . Diverticulosis   . Hemorrhoids   . Vitamin D deficiency     Encounter Details: Received abd ct result from Dr.Pyrtle's office. WNL Appointment scheduled 03/12/2015 Called  Pt , result given.  Also ordered medicines from [facility] refill 3 meds.

## 2015-01-08 NOTE — Congregational Nurse Program (Unsigned)
Congregational Nurse Program Note  Date of Encounter: 01/07/2015  Past Medical History: Past Medical History  Diagnosis Date  . Diabetes mellitus   . Hypertension   . Hyperlipidemia   . Hypothyroidism   . GERD (gastroesophageal reflux disease)   . Osteoporosis   . IBS (irritable bowel syndrome)   . Dyspepsia   . UTI (lower urinary tract infection)   . Diverticulosis   . Hemorrhoids   . Vitamin D deficiency     Encounter Details:     CNP Questionnaire - 01/07/15 0955    Patient Demographics   Is this a new or existing patient? Existing   Patient is considered a/an Immigrant   Patient Assistance   Patient's financial/insurance status Other (comment)  medicare   Patient referred to apply for the following financial assistance Not Applicable   Food insecurities addressed Not Applicable   Transportation assistance Yes   Assistance securing medications Yes   Educational health offerings Navigating the healthcare system  CT abdomen   Encounter Details   Primary purpose of visit Navigating the Healthcare System;Spiritual Care/Support Visit;Education/Health Concerns   Was an Emergency Department visit averted? --  NA   Does patient have a medical provider? Yes   Patient referred to Not Applicable   Was a mental health screening completed? (GAINS tool) No   Does patient have dental issues? No   Since previous encounter, have you referred patient for abnormal blood pressure that resulted in a new diagnosis or medication change? No   Since previous encounter, have you referred patient for abnormal blood glucose that resulted in a new diagnosis or medication change? No     Need ride and translate for Abd. CT Pick client from home and return home. Explained procedure and education given. Asked the CT tech to send result to primary Dr. Maudie Mercury . Has appointment with Dr.Bolds on the Thursday.

## 2015-01-10 ENCOUNTER — Encounter: Payer: Self-pay | Admitting: *Deleted

## 2015-01-10 DIAGNOSIS — G44209 Tension-type headache, unspecified, not intractable: Secondary | ICD-10-CM

## 2015-01-10 NOTE — Congregational Nurse Program (Signed)
Congregational Nurse Program Note  Date of Encounter: 01/10/2015  Past Medical History: Past Medical History  Diagnosis Date  . Diabetes mellitus   . Hypertension   . Hyperlipidemia   . Hypothyroidism   . GERD (gastroesophageal reflux disease)   . Osteoporosis   . IBS (irritable bowel syndrome)   . Dyspepsia   . UTI (lower urinary tract infection)   . Diverticulosis   . Hemorrhoids   . Vitamin D deficiency     Encounter Details:     CNP Questionnaire - 01/09/15 0930    Patient Demographics   Is this a new or existing patient? Existing   Patient is considered a/an Immigrant   Patient Assistance   Patient's financial/insurance status Other (comment)  medicare   Patient referred to apply for the following financial assistance Not Applicable   Food insecurities addressed Not Applicable   Transportation assistance Yes   Assistance securing medications Yes   Educational health offerings Navigating the healthcare system;Spiritual care;Health literacy   Encounter Details   Primary purpose of visit Navigating the Healthcare System   Was an Emergency Department visit averted? No   Does patient have a medical provider? Yes   Patient referred to Not Applicable   Was a mental health screening completed? (GAINS tool) No   Does patient have dental issues? No   Since previous encounter, have you referred patient for abnormal blood pressure that resulted in a new diagnosis or medication change? No   Since previous encounter, have you referred patient for abnormal blood glucose that resulted in a new diagnosis or medication change? No     Visit Dr. Maudie Mercury, f/u CT of Head and abdomen. Both result WNL Pt. No further c/o voiced. Next F/u visit in 4 month.

## 2015-01-26 NOTE — Congregational Nurse Program (Unsigned)
Congregational Nurse Program Note  Date of Encounter: 12/28/2014  Past Medical History: Past Medical History  Diagnosis Date  . Diabetes mellitus   . Hypertension   . Hyperlipidemia   . Hypothyroidism   . GERD (gastroesophageal reflux disease)   . Osteoporosis   . IBS (irritable bowel syndrome)   . Dyspepsia   . UTI (lower urinary tract infection)   . Diverticulosis   . Hemorrhoids   . Vitamin D deficiency     Encounter Details:     CNP Questionnaire - 01/23/15 1000    Patient Demographics   Is this a new or existing patient? Existing   Patient is considered a/an Immigrant   Patient Assistance   Patient's financial/insurance status Other (comment)  medicare   Patient referred to apply for the following financial assistance Not Applicable   Food insecurities addressed Not Applicable   Transportation assistance Yes   Assistance securing medications Yes   Educational health offerings Chronic disease;Medications;Spiritual care   Encounter Details   Primary purpose of visit Chronic Illness/Condition Visit;Navigating the Healthcare System   Was an Emergency Department visit averted? Not Applicable   Does patient have a medical provider? Yes   Patient referred to Clinic   Was a mental health screening completed? (GAINS tool) No   Does patient have dental issues? No   Since previous encounter, have you referred patient for abnormal blood pressure that resulted in a new diagnosis or medication change? No   Since previous encounter, have you referred patient for abnormal blood glucose that resulted in a new diagnosis or medication change? No    Pt c/o UTI Sx on Sat. 01/18/15 Called Dr. Karsten Ro Office and made appointment @ 01/20/2015 @ 13;15 Had urine specimen check, + UTI. Put bactrim BID and will call back 2-3 days for culture report. Call back urology office due to no report call back since vs, also no improving sx per client. Call on 01/23/2015.  No culture done. Need new  specimen. Stopped clinic for urine specimen.  Await call back from Dr's office.

## 2015-01-29 ENCOUNTER — Encounter: Payer: Self-pay | Admitting: *Deleted

## 2015-01-29 DIAGNOSIS — N390 Urinary tract infection, site not specified: Secondary | ICD-10-CM

## 2015-01-29 NOTE — Congregational Nurse Program (Signed)
Congregational Nurse Program Note  Date of Encounter: 01/29/2015  Past Medical History: Past Medical History  Diagnosis Date  . Diabetes mellitus   . Hypertension   . Hyperlipidemia   . Hypothyroidism   . GERD (gastroesophageal reflux disease)   . Osteoporosis   . IBS (irritable bowel syndrome)   . Dyspepsia   . UTI (lower urinary tract infection)   . Diverticulosis   . Hemorrhoids   . Vitamin D deficiency     Encounter Details:     CNP Questionnaire - 01/27/15 1000    Patient Demographics   Is this a new or existing patient? Existing   Patient is considered a/an Immigrant   Patient Assistance   Patient's financial/insurance status Other (comment)   Patient referred to apply for the following financial assistance Not Applicable   Food insecurities addressed Not Applicable   Transportation assistance No   Assistance securing medications No   Educational health offerings Navigating the healthcare system;Chronic disease   Encounter Details   Primary purpose of visit Chronic Illness/Condition Visit;Navigating the Healthcare System;Other (comment)  translate   Was an Emergency Department visit averted? Not Applicable   Does patient have a medical provider? Yes   Patient referred to Clinic   Was a mental health screening completed? (GAINS tool) No   Does patient have dental issues? No   Since previous encounter, have you referred patient for abnormal blood pressure that resulted in a new diagnosis or medication change? No   Since previous encounter, have you referred patient for abnormal blood glucose that resulted in a new diagnosis or medication change? No   For Abstraction Use Only   Does patient have insurance? Yes     urine culture result came back; resist to sulfa what she took it 7days. Order received Cipro 500mg  BID x7days. Questioned regarding medicine what she was taking , not clear answer got because Dr. Loel Lofty was in surgery, NP Geralynn Ochs treat UTI  first. Stopped by Manuela Neptune, bought Cipro. Instruction given to Mrs.Witherell.

## 2015-01-30 ENCOUNTER — Encounter: Payer: Self-pay | Admitting: *Deleted

## 2015-01-31 ENCOUNTER — Ambulatory Visit (INDEPENDENT_AMBULATORY_CARE_PROVIDER_SITE_OTHER): Payer: Medicare Other | Admitting: Emergency Medicine

## 2015-01-31 VITALS — BP 122/72 | HR 87 | Temp 98.3°F | Resp 17 | Ht 59.0 in | Wt 117.0 lb

## 2015-01-31 DIAGNOSIS — E119 Type 2 diabetes mellitus without complications: Secondary | ICD-10-CM | POA: Diagnosis not present

## 2015-01-31 DIAGNOSIS — I10 Essential (primary) hypertension: Secondary | ICD-10-CM | POA: Diagnosis not present

## 2015-01-31 DIAGNOSIS — R079 Chest pain, unspecified: Secondary | ICD-10-CM | POA: Diagnosis not present

## 2015-01-31 LAB — POCT URINALYSIS DIP (MANUAL ENTRY)
BILIRUBIN UA: NEGATIVE
Bilirubin, UA: NEGATIVE
GLUCOSE UA: NEGATIVE
Leukocytes, UA: NEGATIVE
Nitrite, UA: NEGATIVE
Protein Ur, POC: NEGATIVE
RBC UA: NEGATIVE
SPEC GRAV UA: 1.015
UROBILINOGEN UA: 0.2
pH, UA: 7.5

## 2015-01-31 LAB — COMPREHENSIVE METABOLIC PANEL
ALBUMIN: 3.8 g/dL (ref 3.6–5.1)
ALT: 20 U/L (ref 6–29)
AST: 20 U/L (ref 10–35)
Alkaline Phosphatase: 54 U/L (ref 33–130)
BILIRUBIN TOTAL: 0.4 mg/dL (ref 0.2–1.2)
BUN: 11 mg/dL (ref 7–25)
CO2: 26 mmol/L (ref 20–31)
CREATININE: 0.72 mg/dL (ref 0.60–0.93)
Calcium: 8.6 mg/dL (ref 8.6–10.4)
Chloride: 104 mmol/L (ref 98–110)
Glucose, Bld: 114 mg/dL — ABNORMAL HIGH (ref 65–99)
Potassium: 4.3 mmol/L (ref 3.5–5.3)
SODIUM: 137 mmol/L (ref 135–146)
TOTAL PROTEIN: 6.3 g/dL (ref 6.1–8.1)

## 2015-01-31 LAB — POCT CBC
Granulocyte percent: 61.3 %G (ref 37–80)
HCT, POC: 29.2 % — AB (ref 37.7–47.9)
HEMOGLOBIN: 9.8 g/dL — AB (ref 12.2–16.2)
LYMPH, POC: 2.3 (ref 0.6–3.4)
MCH, POC: 30.2 pg (ref 27–31.2)
MCHC: 33.7 g/dL (ref 31.8–35.4)
MCV: 89.4 fL (ref 80–97)
MID (cbc): 0.4 (ref 0–0.9)
MPV: 6.5 fL (ref 0–99.8)
PLATELET COUNT, POC: 256 10*3/uL (ref 142–424)
POC Granulocyte: 4.2 (ref 2–6.9)
POC LYMPH %: 33.3 % (ref 10–50)
POC MID %: 5.4 %M (ref 0–12)
RBC: 3.26 M/uL — AB (ref 4.04–5.48)
RDW, POC: 17 %
WBC: 6.8 10*3/uL (ref 4.6–10.2)

## 2015-01-31 LAB — POC MICROSCOPIC URINALYSIS (UMFC): MUCUS RE: ABSENT

## 2015-01-31 LAB — GLUCOSE, POCT (MANUAL RESULT ENTRY): POC GLUCOSE: 129 mg/dL — AB (ref 70–99)

## 2015-01-31 NOTE — Patient Instructions (Signed)
Nonspecific Chest Pain  °Chest pain can be caused by many different conditions. There is always a chance that your pain could be related to something serious, such as a heart attack or a blood clot in your lungs. Chest pain can also be caused by conditions that are not life-threatening. If you have chest pain, it is very important to follow up with your health care provider. °CAUSES  °Chest pain can be caused by: °· Heartburn. °· Pneumonia or bronchitis. °· Anxiety or stress. °· Inflammation around your heart (pericarditis) or lung (pleuritis or pleurisy). °· A blood clot in your lung. °· A collapsed lung (pneumothorax). It can develop suddenly on its own (spontaneous pneumothorax) or from trauma to the chest. °· Shingles infection (varicella-zoster virus). °· Heart attack. °· Damage to the bones, muscles, and cartilage that make up your chest wall. This can include: °¨ Bruised bones due to injury. °¨ Strained muscles or cartilage due to frequent or repeated coughing or overwork. °¨ Fracture to one or more ribs. °¨ Sore cartilage due to inflammation (costochondritis). °RISK FACTORS  °Risk factors for chest pain may include: °· Activities that increase your risk for trauma or injury to your chest. °· Respiratory infections or conditions that cause frequent coughing. °· Medical conditions or overeating that can cause heartburn. °· Heart disease or family history of heart disease. °· Conditions or health behaviors that increase your risk of developing a blood clot. °· Having had chicken pox (varicella zoster). °SIGNS AND SYMPTOMS °Chest pain can feel like: °· Burning or tingling on the surface of your chest or deep in your chest. °· Crushing, pressure, aching, or squeezing pain. °· Dull or sharp pain that is worse when you move, cough, or take a deep breath. °· Pain that is also felt in your back, neck, shoulder, or arm, or pain that spreads to any of these areas. °Your chest pain may come and go, or it may stay  constant. °DIAGNOSIS °Lab tests or other studies may be needed to find the cause of your pain. Your health care provider may have you take a test called an ambulatory ECG (electrocardiogram). An ECG records your heartbeat patterns at the time the test is performed. You may also have other tests, such as: °· Transthoracic echocardiogram (TTE). During echocardiography, sound waves are used to create a picture of all of the heart structures and to look at how blood flows through your heart. °· Transesophageal echocardiogram (TEE). This is a more advanced imaging test that obtains images from inside your body. It allows your health care provider to see your heart in finer detail. °· Cardiac monitoring. This allows your health care provider to monitor your heart rate and rhythm in real time. °· Holter monitor. This is a portable device that records your heartbeat and can help to diagnose abnormal heartbeats. It allows your health care provider to track your heart activity for several days, if needed. °· Stress tests. These can be done through exercise or by taking medicine that makes your heart beat more quickly. °· Blood tests. °· Imaging tests. °TREATMENT  °Your treatment depends on what is causing your chest pain. Treatment may include: °· Medicines. These may include: °¨ Acid blockers for heartburn. °¨ Anti-inflammatory medicine. °¨ Pain medicine for inflammatory conditions. °¨ Antibiotic medicine, if an infection is present. °¨ Medicines to dissolve blood clots. °¨ Medicines to treat coronary artery disease. °· Supportive care for conditions that do not require medicines. This may include: °¨ Resting. °¨ Applying heat   or cold packs to injured areas. °¨ Limiting activities until pain decreases. °HOME CARE INSTRUCTIONS °· If you were prescribed an antibiotic medicine, finish it all even if you start to feel better. °· Avoid any activities that bring on chest pain. °· Do not use any tobacco products, including  cigarettes, chewing tobacco, or electronic cigarettes. If you need help quitting, ask your health care provider. °· Do not drink alcohol. °· Take medicines only as directed by your health care provider. °· Keep all follow-up visits as directed by your health care provider. This is important. This includes any further testing if your chest pain does not go away. °· If heartburn is the cause for your chest pain, you may be told to keep your head raised (elevated) while sleeping. This reduces the chance that acid will go from your stomach into your esophagus. °· Make lifestyle changes as directed by your health care provider. These may include: °¨ Getting regular exercise. Ask your health care provider to suggest some activities that are safe for you. °¨ Eating a heart-healthy diet. A registered dietitian can help you to learn healthy eating options. °¨ Maintaining a healthy weight. °¨ Managing diabetes, if necessary. °¨ Reducing stress. °SEEK MEDICAL CARE IF: °· Your chest pain does not go away after treatment. °· You have a rash with blisters on your chest. °· You have a fever. °SEEK IMMEDIATE MEDICAL CARE IF:  °· Your chest pain is worse. °· You have an increasing cough, or you cough up blood. °· You have severe abdominal pain. °· You have severe weakness. °· You faint. °· You have chills. °· You have sudden, unexplained chest discomfort. °· You have sudden, unexplained discomfort in your arms, back, neck, or jaw. °· You have shortness of breath at any time. °· You suddenly start to sweat, or your skin gets clammy. °· You feel nauseous or you vomit. °· You suddenly feel light-headed or dizzy. °· Your heart begins to beat quickly, or it feels like it is skipping beats. °These symptoms may represent a serious problem that is an emergency. Do not wait to see if the symptoms will go away. Get medical help right away. Call your local emergency services (911 in the [place] Do not drive yourself to the hospital. °  °This  information is not intended to replace advice given to you by your health care provider. Make sure you discuss any questions you have with your health care provider. °  °Document Released: 12/09/2004 Document Revised: 03/22/2014 Document Reviewed: 10/05/2013 °Elsevier Interactive Patient Education ©2016 [employer] ° °

## 2015-01-31 NOTE — Progress Notes (Signed)
Subjective:  Patient ID: Lauren [name], female    DOB: [birth year]  Age: 74 y.o. MRN: [id number]  CC: Fatigue and Shortness of Breath   HPI Lauren [name] presents  patient has a recent treatment for urinary tract infection apparently she was treated with Septra initially and that failed social on Cipro. After taking Cipro for short-term she developed intermittent symptoms of meter suspicious she had a low blood sugar. Faint knee and had chest discomfort associated with some shortness of breath. She denied any rash cough wheezing. Is no peripheral edema. No radiation of pain in her neck T score arm or back. She has a history of hypertension and diabetes her sugar was tested each time she became ill and was in the 120 range. She lives alone. She had her most recent episode today and is still symptomatic. 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   She has past surgical history that includes Colonoscopy.   Her  family history is negative for Colon cancer.  She   reports that she has never smoked. She has never used smokeless tobacco. She reports that she does not drink alcohol or use illicit drugs.  Outpatient Prescriptions Prior to Visit  Medication Sig Dispense Refill  . alendronate (FOSAMAX) 70 MG tablet Take 70 mg by mouth every 7 (seven) days. Take with a full glass of water on an empty stomach.     Marland Kitchen aspirin 81 MG tablet Take 81 mg by mouth daily.      . Cholecalciferol (VITAMIN D3) 2000 UNITS TABS Take 1 tablet by mouth daily.    Marland Kitchen glucosamine-chondroitin 500-400 MG tablet Take 1 tablet by mouth 3 (three) times daily.    Marland Kitchen levothyroxine (SYNTHROID, LEVOTHROID) 75 MCG tablet Take 75 mcg by mouth daily.       Marland Kitchen losartan (COZAAR) 100 MG tablet Take 100 mg by mouth daily.      . Magnesium 400 MG CAPS Take 1 capsule by mouth daily.    . mirabegron ER (MYRBETRIQ) 25 MG TB24 tablet Take 25 mg by mouth daily.    . Multiple Vitamin (MULTIVITAMIN) tablet Take 1 tablet by mouth daily.    Marland Kitchen omeprazole (PRILOSEC) 40 MG capsule Take 40 mg by mouth 2 (two) times daily.     . phenazopyridine (PYRIDIUM) 100 MG tablet Take 1 tablet (100 mg total) by mouth 3 (three) times daily as needed for pain. 10 tablet 0  . pravastatin (PRAVACHOL) 40 MG tablet Take 40 mg by mouth daily.      . sitaGLIPtan-metformin (JANUMET) 50-500 MG per tablet Take 1 tablet by mouth 2 (two) times daily with a meal.      . vitamin B-12 (CYANOCOBALAMIN) 100 MCG tablet Take 100 mcg by mouth daily.     No facility-administered medications prior to visit.    Social History   Social History  . Marital Status: Widowed    Spouse Name: N/A  . Number of Children: 3  . Years of Education: N/A   Occupational History  . retired    Social History Main Topics  . Smoking status: Never Smoker   . Smokeless tobacco: Never Used  . Alcohol Use: No  . Drug Use: No  . Sexual Activity: No   Other Topics Concern  . None  Social History Narrative     Review of Systems  Constitutional: Positive for fatigue. Negative for fever, chills and appetite change.  HENT: Negative for congestion, ear pain, postnasal drip, sinus pressure and sore throat.   Eyes: Negative for pain and redness.  Respiratory: Positive for chest tightness and shortness of breath. Negative for cough and wheezing.   Cardiovascular: Negative for leg swelling.  Gastrointestinal: Negative for nausea, vomiting, abdominal pain, diarrhea, constipation and blood in stool.  Endocrine: Negative for polyuria.  Genitourinary: Negative for dysuria, urgency, frequency and flank pain.  Musculoskeletal: Negative for gait problem.  Skin: Negative for rash.  Neurological: Positive for  dizziness. Negative for tremors, seizures, syncope, facial asymmetry, speech difficulty, weakness, light-headedness, numbness and headaches.  Psychiatric/Behavioral: Negative for confusion and decreased concentration. The patient is not nervous/anxious.     Objective:  BP 122/72 mmHg  Pulse 87  Temp(Src) 98.3 F (36.8 C) (Oral)  Resp 17  Ht 4\' 11"  (1.499 m)  Wt 117 lb (53.071 kg)  BMI 23.62 kg/m2  SpO2 97%  Physical Exam  Constitutional: She is oriented to person, place, and time. She appears well-developed and well-nourished. No distress.  HENT:  Head: Normocephalic and atraumatic.  Right Ear: External ear normal.  Left Ear: External ear normal.  Nose: Nose normal.  Eyes: Conjunctivae and EOM are normal. Pupils are equal, round, and reactive to light. No scleral icterus.  Neck: Normal range of motion. Neck supple. No tracheal deviation present.  Cardiovascular: Normal rate, regular rhythm and normal heart sounds.   Pulmonary/Chest: Effort normal. No respiratory distress. She has no wheezes. She has no rales.  Abdominal: She exhibits no mass. There is no tenderness. There is no rebound and no guarding.  Musculoskeletal: She exhibits no edema.  Lymphadenopathy:    She has no cervical adenopathy.  Neurological: She is alert and oriented to person, place, and time. Coordination normal.  Skin: Skin is warm and dry. No rash noted.  Psychiatric: She has a normal mood and affect. Her behavior is normal.      Assessment & Plan:   Lauren [name] was seen today for fatigue and shortness of breath.  Diagnoses and all orders for this visit:  Chest pain, unspecified chest pain type -     POCT CBC -     EKG 12-Lead -     Comprehensive metabolic panel -     POCT urinalysis dipstick -     POCT Microscopic Urinalysis ([facility]) -     POCT glucose (manual entry)  Essential hypertension  Diabetes mellitus without complication (Lauren [name])  I am having Lauren [name] maintain her aspirin, levothyroxine,  alendronate, losartan, pravastatin, sitaGLIPtin-metformin, glucosamine-chondroitin, multivitamin, phenazopyridine, mirabegron ER, omeprazole, vitamin B-12, Magnesium, and Vitamin D3.  No orders of the defined types were placed in this encounter.    Appropriate red flag conditions were discussed with the patient as well as actions that should be taken.  Patient expressed his understanding.  She was encouraged to go to the emergency room as we aren't able to do immediate labs or imaging through the office and saw Lauren [name] problem is but I dissuaded her from throwing the idea that it was related to Inez. I encouraged her to stop taking Cipro as her urine was clear  Follow-up: Return if symptoms worsen or fail to improve.  Roselee Culver, MD   Results for orders placed or performed in visit on 01/31/15  POCT CBC  Result Value Ref Range   WBC 6.8 4.6 -  10.2 K/uL   Lymph, poc 2.3 0.6 - 3.4   POC LYMPH PERCENT 33.3 10 - 50 %L   MID (cbc) 0.4 0 - 0.9   POC MID % 5.4 0 - 12 %M   POC Granulocyte 4.2 2 - 6.9   Granulocyte percent 61.3 37 - 80 %G   RBC 3.26 (A) 4.04 - 5.48 M/uL   Hemoglobin 9.8 (A) 12.2 - 16.2 g/dL   HCT, POC 29.2 (A) 37.7 - 47.9 %   MCV 89.4 80 - 97 fL   MCH, POC 30.2 27 - 31.2 pg   MCHC 33.7 31.8 - 35.4 g/dL   RDW, POC 17.0 %   Platelet Count, POC 256 142 - 424 K/uL   MPV 6.5 0 - 99.8 fL  POCT urinalysis dipstick  Result Value Ref Range   Color, UA yellow yellow   Clarity, UA clear clear   Glucose, UA negative negative   Bilirubin, UA negative negative   Ketones, POC UA negative negative   Spec Grav, UA 1.015    Blood, UA negative negative   pH, UA 7.5    Protein Ur, POC negative negative   Urobilinogen, UA 0.2    Nitrite, UA Negative Negative   Leukocytes, UA Negative Negative  POCT Microscopic Urinalysis ([facility])  Result Value Ref Range   WBC,UR,HPF,POC None None WBC/hpf   RBC,UR,HPF,POC None None RBC/hpf   Bacteria None None, Too numerous to count    Mucus Absent Absent   Epithelial Cells, UR Per Microscopy None None, Too numerous to count cells/hpf  POCT glucose (manual entry)  Result Value Ref Range   POC Glucose 129 (A) 70 - 99 mg/dl

## 2015-02-01 ENCOUNTER — Encounter: Payer: Self-pay | Admitting: *Deleted

## 2015-02-01 DIAGNOSIS — T7840XA Allergy, unspecified, initial encounter: Secondary | ICD-10-CM

## 2015-02-01 NOTE — Congregational Nurse Program (Signed)
Congregational Nurse Program Note  Date of Encounter: 01/31/2015 1600  Past Medical History: Past Medical History  Diagnosis Date  . Diabetes mellitus   . Hypertension   . Hyperlipidemia   . Hypothyroidism   . GERD (gastroesophageal reflux disease)   . Osteoporosis   . IBS (irritable bowel syndrome)   . Dyspepsia   . UTI (lower urinary tract infection)   . Diverticulosis   . Hemorrhoids   . Vitamin D deficiency     Encounter Details:     CNP Questionnaire - 01/31/15 1600    Patient Demographics   Is this a new or existing patient? Existing   Patient is considered a/an Immigrant   Patient Assistance   Patient's financial/insurance status Other (comment)   Patient referred to apply for the following financial assistance Not Applicable   Food insecurities addressed Not Applicable   Transportation assistance Yes   Assistance securing medications Yes   Educational health offerings Navigating the healthcare system;Medications;Spiritual care   Encounter Details   Primary purpose of visit Acute Illness/Condition Visit;Navigating the Healthcare System;Spiritual Care/Support Visit;Other (comment)  translate   Was an Emergency Department visit averted? Not Applicable   Does patient have a medical provider? Yes  unable to see pt today, made an appointment on Monday 3p with Dr.Herter   Patient referred to Urgent Care   Was a mental health screening completed? (GAINS tool) No   Does patient have dental issues? No   Since previous encounter, have you referred patient for abnormal blood pressure that resulted in a new diagnosis or medication change? No   Since previous encounter, have you referred patient for abnormal blood glucose that resulted in a new diagnosis or medication change? No   For Abstraction Use Only   Does patient have insurance? Yes     pt called @0722am  due to unexplain sx that cold and burning sensation from chest to neck, no energy, sweet taste when breathing or  talk, denies fever, denied numbness or tingling , no one side weakness, slurred speeches, unbalanced weight. Denies any chest pain. This similar episode started since she was taking Cipro on Monday. mightbe low blood sugar? Said CBG was 130, BP was 120/91. And she call back again 0740, 1000am Called Dr. Julianne Rice office if she can check with him, nurse call back , said unable to see her today. Made an appointment on 02/04/2015  Monday 3P. Went her house pick her up and visit urgent care. Basic las and EKG done. ua done. MD said need go to ER to check more detail labs for chest. And said may DC Cipro due to ua result clear. pt wanted to wait improving condition if dc'd Cipro. She thought Cipro causing problem. Gave her ride home.  Will F/U late this evening.

## 2015-02-25 ENCOUNTER — Encounter: Payer: Self-pay | Admitting: *Deleted

## 2015-03-12 ENCOUNTER — Encounter: Payer: Self-pay | Admitting: Internal Medicine

## 2015-03-12 ENCOUNTER — Ambulatory Visit (INDEPENDENT_AMBULATORY_CARE_PROVIDER_SITE_OTHER): Payer: Medicare Other | Admitting: Internal Medicine

## 2015-03-12 ENCOUNTER — Encounter: Payer: Self-pay | Admitting: *Deleted

## 2015-03-12 VITALS — BP 124/58 | HR 80 | Ht 58.5 in | Wt 121.1 lb

## 2015-03-12 DIAGNOSIS — N8184 Pelvic muscle wasting: Secondary | ICD-10-CM | POA: Diagnosis not present

## 2015-03-12 DIAGNOSIS — K219 Gastro-esophageal reflux disease without esophagitis: Secondary | ICD-10-CM | POA: Diagnosis not present

## 2015-03-12 DIAGNOSIS — M6289 Other specified disorders of muscle: Secondary | ICD-10-CM

## 2015-03-12 DIAGNOSIS — K648 Other hemorrhoids: Secondary | ICD-10-CM | POA: Diagnosis not present

## 2015-03-12 DIAGNOSIS — Z09 Encounter for follow-up examination after completed treatment for conditions other than malignant neoplasm: Secondary | ICD-10-CM

## 2015-03-12 DIAGNOSIS — K429 Umbilical hernia without obstruction or gangrene: Secondary | ICD-10-CM

## 2015-03-12 DIAGNOSIS — R151 Fecal smearing: Secondary | ICD-10-CM

## 2015-03-12 MED ORDER — OMEPRAZOLE 40 MG PO CPDR: 40.0000 mg | DELAYED_RELEASE_CAPSULE | Freq: Every day | ORAL | Status: AC

## 2015-03-12 NOTE — Progress Notes (Signed)
Subjective:    Patient ID: Lauren [name], female    DOB: 28-Sep-1940, 74 y.o.   MRN: [id number]  HPI Cedricka Overgaard is a 74 year old female with past medical history of GERD, hiatal hernia, diverticulosis and internal hemorrhoids who is seen in follow-up. She was last seen in October 2016 by Alonza Bogus, PA-C to evaluate fecal smearing and incontinence. She is here today with her daughter who provides translation and also another friend. Reason she has been doing very well. She reports her bowel movements have been regular occurring 1-2 times per day without blood in her stool or melena. If her stools are ever loose she does have issues with fecal smearing but this is rare. Metamucil was recommended but she has not needed it. She has been having no abdominal pain. She occasionally feels a "knot" at her umbilicus which is more present with activity such as lifting. CT scan was performed recently and unremarkable. She does have heartburn but this is well controlled currently. No dysphagia or odynophagia. No early satiety, nausea or vomiting. Dr. Maudie Mercury, her primary care doctor, increased omeprazole to 40 mg 3 times a day from daily when she was having issues with breakthrough heartburn. Increasing the dose worked well. She has had frequent UTI treated by urology and is currently taking Pyridium.  Colonoscopy was performed for heme-positive stool on 05/13/2011 -- mild diverticulosis in the ascending colon and sigmoid colon. Internal hemorrhoids and decreased anal sphincter tone. EGD performed November 2012 -- mild gastritis, H. pylori negative. Small hiatal hernia  Review of Systems As per history of present illness, otherwise negative  Current Medications, Allergies, Past Medical History, Past Surgical History, Family History and Social History were reviewed in [employer] record.     Objective:   Physical Exam BP 124/58 mmHg  Pulse 80  Ht 4' 10.5" (1.486 m)  Wt 121 lb 2 oz (54.942  kg)  BMI 24.88 kg/m2 Constitutional: Well-developed and well-nourished. No distress. HEENT: Normocephalic and atraumatic. Oropharynx is clear and moist. No oropharyngeal exudate. Conjunctivae are normal.  No scleral icterus. Neck: Neck supple. Trachea midline. Cardiovascular: Normal rate, regular rhythm and intact distal pulses. No M/R/G Pulmonary/chest: Effort normal and breath sounds normal. No wheezing, rales or rhonchi. Abdominal: Soft, nontender, nondistended. Bowel sounds active throughout. There are no masses palpable. No hepatosplenomegaly. Very small umbilical hernia Extremities: no clubbing, cyanosis, or edema Neurological: Alert and oriented to person place and time. Skin: Skin is warm and dry. No rashes noted. Psychiatric: Normal mood and affect. Behavior is normal.  CBC    Component Value Date/Time   WBC 6.8 01/31/2015 1312   RBC 3.26* 01/31/2015 1312   HGB 9.8* 01/31/2015 1312   HCT 29.2* 01/31/2015 1312   MCV 89.4 01/31/2015 1312   MCH 30.2 01/31/2015 1312   MCHC 33.7 01/31/2015 1312    CMP     Component Value Date/Time   NA 137 01/31/2015 1255   K 4.3 01/31/2015 1255   CL 104 01/31/2015 1255   CO2 26 01/31/2015 1255   GLUCOSE 114* 01/31/2015 1255   BUN 11 01/31/2015 1255   CREATININE 0.72 01/31/2015 1255   CREATININE 0.63 01/01/2015 1156   CALCIUM 8.6 01/31/2015 1255   CALCIUM 8.7 04/15/2011 1003   PROT 6.3 01/31/2015 1255   ALBUMIN 3.8 01/31/2015 1255   AST 20 01/31/2015 1255   ALT 20 01/31/2015 1255   ALKPHOS 54 01/31/2015 1255   BILITOT 0.4 01/31/2015 1255  CLINICAL DATA:  Two month history of intermittent fecal incontinence.   EXAM: CT ABDOMEN AND PELVIS WITH CONTRAST   TECHNIQUE: Multidetector CT imaging of the abdomen and pelvis was performed using the standard protocol following bolus administration of intravenous contrast.   CONTRAST:  134mL OMNIPAQUE IOHEXOL 300 MG/ML  SOLN   COMPARISON:  11/14/8   FINDINGS: Lower chest:  The lung bases appear clear. No pleural or pericardial effusion.   Hepatobiliary: No suspicious liver abnormality. The gallbladder appears within normal limits. No biliary dilatation.   Pancreas: Negative   Spleen: Multiple calcified granulomas noted.   Adrenals/Urinary Tract: The adrenal glands are normal. Normal appearance of the right kidney. The left kidney is also normal. No focal bladder abnormality identified.   Stomach/Bowel: The stomach is normal. The small bowel loops have a normal course and caliber. No dilated loops of small bowel identified. Normal appearance of the colon. No mass identified.   Vascular/Lymphatic: Calcified atherosclerotic disease involves the abdominal aorta. No aneurysm. No enlarged retroperitoneal or mesenteric adenopathy. No enlarged pelvic or inguinal lymph nodes.   Reproductive: Uterus is atrophic.  No adnexal mass.   Other: No free fluid or fluid collections.   Musculoskeletal: Degenerative disc disease identified within the lower lumbar spine. No aggressive lytic or sclerotic bone lesions identified.   IMPRESSION: 1. No acute findings within the abdomen or pelvis. No explanation for fecal incontinence. 2. Aortic atherosclerosis 3. Prior granulomatous disease.     Electronically Signed   By: Kerby Moors M.D.   On: 01/07/2015 10:03      Assessment & Plan:  74 year old female with past medical history of GERD, hiatal hernia, diverticulosis and internal hemorrhoids who is seen in follow-up.  1. Fecal smearing/decreased sphincter tone -- likely element of pelvic floor dysfunction. Not currently an issue as stools have been formed and regular. I've advised Metamucil 1 tablespoon daily his stools become loose. Should help bulk the stool and prevent fecal smearing. Hemorrhoids could also contribute to fecal smearing and we have discussed this today. Given lack of considerable symptoms now, we'll defer treatment. She is happy with this  plan  2. GERD -- well-controlled at present. I recommended decreasing PPI back to once daily. If symptoms return she can increase back to twice a day before meals.  3. Periumbilical "knot" -- CT performed for this reason an unremarkable. Very small umbilical hernia. Reassurance provided. This will likely not bother her and should not require surgical intervention unless it becomes larger and more symptomatic.  4. Frequent UTI/dysuria -- she is taking Pyridium and would like to take this chronically. I deferred this to urology and asked that she contact urology for this issue. She voiced understanding  Follow-up as needed 25 minutes spent with the patient today. Greater than 50% was spent in counseling and coordination of care with the patient

## 2015-03-12 NOTE — Patient Instructions (Signed)
Please decrease omeprazole to 40 mg once daily dosing. You may increase back to twice daily dosing if needed. Please call us if you do go back on twice daily dosing so we can send your prescriptions accordingly.  Please purchase the following medications over the counter and take as directed: Metamucil-as needed for loose stools and fecal seepage.  Follow up with Dr Hilarie Fredrickson as needed.

## 2015-03-22 ENCOUNTER — Telehealth: Payer: Self-pay | Admitting: *Deleted

## 2015-03-22 NOTE — Telephone Encounter (Signed)
D; pt said that need Janumet, A; ordered Janumey via [facility]. And call [facility] due to price.     Will review order again 2-3 days later.

## 2015-03-24 ENCOUNTER — Telehealth: Payer: Self-pay | Admitting: *Deleted

## 2015-03-25 NOTE — Congregational Nurse Program (Unsigned)
Congregational Nurse Program Note  Date of Encounter: 03/12/2015  Past Medical History: Past Medical History  Diagnosis Date  . Diabetes mellitus   . Hypertension   . Hyperlipidemia   . Hypothyroidism   . GERD (gastroesophageal reflux disease)   . Osteoporosis   . IBS (irritable bowel syndrome)   . Dyspepsia   . UTI (lower urinary tract infection)   . Diverticulosis   . Hemorrhoids   . Vitamin D deficiency   . Fecal incontinence   . Aortic atherosclerosis ([place])   . Hiatal hernia   . Gastritis     Encounter Details:     CNP Questionnaire - 03/12/15 1100    Patient Demographics   Is this a new or existing patient? Existing   Patient is considered a/an Immigrant   Race Asian   Patient Assistance   Location of Patient Assistance Not Applicable   Patient's financial/insurance status Medicare   Uninsured Patient No   Patient referred to apply for the following financial assistance Not Applicable   Food insecurities addressed Not Applicable   Transportation assistance Yes  ride given   Assistance securing medications Yes   Educational health offerings Medications;Navigating the healthcare system   Encounter Details   Primary purpose of visit Navigating the Healthcare System;Spiritual Care/Support Visit;Other  translate   Was an Emergency Department visit averted? Not Applicable   Does patient have a medical provider? Yes   Patient referred to Not Applicable   Was a mental health screening completed? (GAINS tool) No   Does patient have dental issues? No   Since previous encounter, have you referred patient for abnormal blood pressure that resulted in a new diagnosis or medication change? No   Since previous encounter, have you referred patient for abnormal blood glucose that resulted in a new diagnosis or medication change? No     f/u with Dr.Pyrtle after CT abdomen done. Dr's office provided translator, but pt wanted be with me due to knowing her better. Translated  and taeching regarding metamucil, decrease prilosec QD if no sx of nausea or reflux.pt. Agreed with MD. Myrtie Cruise and would following MD"s advise.

## 2015-03-26 NOTE — Telephone Encounter (Signed)
Pt is language barrier, need help for medications refill.

## 2015-04-04 ENCOUNTER — Encounter: Payer: Self-pay | Admitting: *Deleted

## 2015-04-04 DIAGNOSIS — Z760 Encounter for issue of repeat prescription: Secondary | ICD-10-CM

## 2015-04-05 NOTE — Congregational Nurse Program (Signed)
Congregational Nurse Program Note  Date of Encounter: 04/04/2015  Past Medical History: Past Medical History  Diagnosis Date  . Diabetes mellitus   . Hypertension   . Hyperlipidemia   . Hypothyroidism   . GERD (gastroesophageal reflux disease)   . Osteoporosis   . IBS (irritable bowel syndrome)   . Dyspepsia   . UTI (lower urinary tract infection)   . Diverticulosis   . Hemorrhoids   . Vitamin D deficiency   . Fecal incontinence   . Aortic atherosclerosis ([place])   . Hiatal hernia   . Gastritis     Encounter Details:     CNP Questionnaire - 04/04/15 1300    Patient Demographics   Is this a new or existing patient? Existing   Patient is considered a/an Immigrant   Race Asian   Patient Assistance   Location of Patient Assistance Not Applicable   Patient's financial/insurance status Medicare   Uninsured Patient No   Patient referred to apply for the following financial assistance Not Applicable   Food insecurities addressed Not Applicable   Transportation assistance No   Assistance securing medications Yes  assisted medication refill    Educational health offerings Health literacy;Medications;Safety   Encounter Details   Primary purpose of visit Other  medication   Was an Emergency Department visit averted? Not Applicable   Does patient have a medical provider? Yes   Patient referred to Not Applicable   Was a mental health screening completed? (GAINS tool) No   Does patient have dental issues? No   Since previous encounter, have you referred patient for abnormal blood pressure that resulted in a new diagnosis or medication change? No   Since previous encounter, have you referred patient for abnormal blood glucose that resulted in a new diagnosis or medication change? No   For Abstraction Use Only   Does patient have insurance? Yes     medications received by mail. Make sure pt got right medicine. Explained medication name, put korean on the bottle to read  them. No c/o voiced.

## 2015-04-16 ENCOUNTER — Encounter: Payer: Self-pay | Admitting: *Deleted

## 2015-04-16 DIAGNOSIS — Z789 Other specified health status: Secondary | ICD-10-CM

## 2015-04-19 NOTE — Congregational Nurse Program (Unsigned)
Congregational Nurse Program Note  Date of Encounter: 04/16/2015  Past Medical History: Past Medical History  Diagnosis Date  . Diabetes mellitus   . Hypertension   . Hyperlipidemia   . Hypothyroidism   . GERD (gastroesophageal reflux disease)   . Osteoporosis   . IBS (irritable bowel syndrome)   . Dyspepsia   . UTI (lower urinary tract infection)   . Diverticulosis   . Hemorrhoids   . Vitamin D deficiency   . Fecal incontinence   . Aortic atherosclerosis ([place])   . Hiatal hernia   . Gastritis     Encounter Details:     CNP Questionnaire - 04/16/15 2030    Patient Demographics   Is this a new or existing patient? Existing   Patient is considered a/an Immigrant   Race Asian   Patient Assistance   Location of Patient Assistance Not Applicable   Patient's financial/insurance status Medicare   Uninsured Patient No   Patient referred to apply for the following financial assistance Not Applicable   Food insecurities addressed Not Applicable   Transportation assistance No   Assistance securing medications Yes   Educational health offerings Other;Medications   Encounter Details   Primary purpose of visit Family/Caregiver Support;Other  need traslate for the medical bills and medicines   Was an Emergency Department visit averted? Not Applicable   Does patient have a medical provider? No   Patient referred to Not Applicable   Was a mental health screening completed? (GAINS tool) No   Does patient have dental issues? No   Since previous encounter, have you referred patient for abnormal blood pressure that resulted in a new diagnosis or medication change? No   Since previous encounter, have you referred patient for abnormal blood glucose that resulted in a new diagnosis or medication change? No   For Abstraction Use Only   Does patient have insurance? Yes     Brought several letters and medical bills. Translated and assisted medications.

## 2015-04-28 ENCOUNTER — Telehealth: Payer: Self-pay | Admitting: *Deleted

## 2015-05-08 ENCOUNTER — Encounter: Payer: Self-pay | Admitting: *Deleted

## 2015-05-12 ENCOUNTER — Telehealth: Payer: Self-pay | Admitting: *Deleted

## 2015-05-12 NOTE — Congregational Nurse Program (Unsigned)
Congregational Nurse Program Note  Date of Encounter: 05/08/2015  Past Medical History: Past Medical History  Diagnosis Date  . Diabetes mellitus   . Hypertension   . Hyperlipidemia   . Hypothyroidism   . GERD (gastroesophageal reflux disease)   . Osteoporosis   . IBS (irritable bowel syndrome)   . Dyspepsia   . UTI (lower urinary tract infection)   . Diverticulosis   . Hemorrhoids   . Vitamin D deficiency   . Fecal incontinence   . Aortic atherosclerosis ([place])   . Hiatal hernia   . Gastritis     Encounter Details:     CNP Questionnaire - 05/08/15 0800    Patient Demographics   Is this a new or existing patient? Existing   Patient is considered a/an Immigrant   Race Asian   Patient Assistance   Location of Patient Assistance Not Applicable  Dr's office to draw labs   Patient's financial/insurance status Medicare   Uninsured Patient No   Patient referred to apply for the following financial assistance Not Applicable   Food insecurities addressed Not Applicable   Transportation assistance Yes   Type of Assistance Other  gave ride and translated   Assistance securing medications No   Type of Assistance Other  translate   Educational health offerings Not Applicable   Encounter Details   Primary purpose of visit [place]   Was an Emergency Department visit averted? No   Does patient have a medical provider? Yes   Patient referred to Not Applicable   Was a mental health screening completed? (GAINS tool) No   Does patient have dental issues? No   Does patient have vision issues? No   Since previous encounter, have you referred patient for abnormal blood pressure that resulted in a new diagnosis or medication change? No   Since previous encounter, have you referred patient for abnormal blood glucose that resulted in a new diagnosis or medication change? No   For Abstraction Use Only   Does patient have insurance? Yes     need blood test  prior visit MD Dr's appointment @ 05/13/2015 @ 1145

## 2015-05-15 ENCOUNTER — Telehealth: Payer: Self-pay | Admitting: *Deleted

## 2015-05-15 ENCOUNTER — Encounter: Payer: Self-pay | Admitting: *Deleted

## 2015-05-15 DIAGNOSIS — D649 Anemia, unspecified: Secondary | ICD-10-CM

## 2015-05-15 NOTE — Congregational Nurse Program (Signed)
Congregational Nurse Program Note  Date of Encounter:05/13/2015 Past Medical History: Past Medical History  Diagnosis Date  . Diabetes mellitus   . Hypertension   . Hyperlipidemia   . Hypothyroidism   . GERD (gastroesophageal reflux disease)   . Osteoporosis   . IBS (irritable bowel syndrome)   . Dyspepsia   . UTI (lower urinary tract infection)   . Diverticulosis   . Hemorrhoids   . Vitamin D deficiency   . Fecal incontinence   . Aortic atherosclerosis ([place])   . Hiatal hernia   . Gastritis     Encounter Details:     CNP Questionnaire - 05/13/15 1140    Patient Demographics   Is this a new or existing patient? Existing   Patient is considered a/an Immigrant   Race Asian   Patient Assistance   Location of Patient Assistance Not Applicable  Dr's office   Patient's financial/insurance status Medicare   Uninsured Patient Yes   Interventions Appt. has been completed  translate, ride given   Patient referred to apply for the following financial assistance Not Applicable   Food insecurities addressed Not Applicable   Transportation assistance Yes  ride given   Type of Assistance Volunteer   Assistance securing medications Yes  new meds ordered   Type of Assistance Other  visit clinic, pharmacy   Educational health offerings Navigating the healthcare system;Medications;Nutrition   Encounter Details   Primary purpose of visit Navigating the Healthcare System;Education/Health Concerns   Was an Emergency Department visit averted? Not Applicable   Does patient have a medical provider? Yes   Patient referred to Other (comment)  dermatology, GI dr per Dr.Selinger   Was a mental health screening completed? (GAINS tool) No   Does patient have dental issues? No   Does patient have vision issues? No   Since previous encounter, have you referred patient for abnormal blood pressure that resulted in a new diagnosis or medication change? No   Since previous encounter, have you  referred patient for abnormal blood glucose that resulted in a new diagnosis or medication change? No   For Abstraction Use Only   Does patient have insurance? Yes     regular check up with Dr. Jani Gravel Lab result abnormal, erupted mole on lt face Iron 325mg Qd, folic acid 0000000 Qd, B12 2000mg  Qd x4 weeks then recheck CBC. Ref. Dermatology and GI Dr.  Deborha Payment will contact with me tomake an appointment.

## 2015-05-20 ENCOUNTER — Encounter: Payer: Self-pay | Admitting: *Deleted

## 2015-05-20 DIAGNOSIS — R109 Unspecified abdominal pain: Secondary | ICD-10-CM

## 2015-05-21 ENCOUNTER — Telehealth: Payer: Self-pay | Admitting: *Deleted

## 2015-05-21 NOTE — Congregational Nurse Program (Signed)
Congregational Nurse Program Note  Date of Encounter: 05/20/2015  Past Medical History: Past Medical History  Diagnosis Date  . Diabetes mellitus   . Hypertension   . Hyperlipidemia   . Hypothyroidism   . GERD (gastroesophageal reflux disease)   . Osteoporosis   . IBS (irritable bowel syndrome)   . Dyspepsia   . UTI (lower urinary tract infection)   . Diverticulosis   . Hemorrhoids   . Vitamin D deficiency   . Fecal incontinence   . Aortic atherosclerosis ([place])   . Hiatal hernia   . Gastritis     Encounter Details:     CNP Questionnaire - 05/20/15 2200    Patient Demographics   Is this a new or existing patient? Existing   Patient is considered a/an Immigrant   Race Asian   Patient Assistance   Location of Patient Assistance Not Applicable  [facility]   Patient's financial/insurance status Medicare   Uninsured Patient No   Interventions Follow-up/Education/Support provided after completed appt.  MEDICATION   Patient referred to apply for the following financial assistance Not Applicable   Food insecurities addressed Not Applicable   Transportation assistance No   Type of Assistance Other  TRANSLATED TO PHARMACY, MD, PICK UP MED FROM PHARMACY   Assistance securing medications Yes  IRON SUPPLEMENT   Type of [place] Department   Educational health offerings Medications   Encounter Details   Primary purpose of visit Other  [place]   Was an Emergency Department visit averted? Not Applicable   Does patient have a medical provider? Yes   Patient referred to Not Applicable   Was a mental health screening completed? (GAINS tool) No   Does patient have dental issues? No   Does patient have vision issues? No   Since previous encounter, have you referred patient for abnormal blood pressure that resulted in a new diagnosis or medication change? No   Since previous encounter, have you referred patient for abnormal  blood glucose that resulted in a new diagnosis or medication change? No   For Abstraction Use Only   Does patient have insurance? Yes     iRON LIQUIDS FORM MED DELIVERED AND TAUGHT HOW TO TAKE AND DOSE. PT UNDERSTOOD.

## 2015-05-24 ENCOUNTER — Encounter: Payer: Self-pay | Admitting: *Deleted

## 2015-05-24 ENCOUNTER — Telehealth: Payer: Self-pay | Admitting: *Deleted

## 2015-05-24 DIAGNOSIS — D229 Melanocytic nevi, unspecified: Secondary | ICD-10-CM

## 2015-05-24 NOTE — Congregational Nurse Program (Signed)
Congregational Nurse Program Note  Date of Encounter: 05/24/2015  Past Medical History: Past Medical History  Diagnosis Date  . Diabetes mellitus   . Hypertension   . Hyperlipidemia   . Hypothyroidism   . GERD (gastroesophageal reflux disease)   . Osteoporosis   . IBS (irritable bowel syndrome)   . Dyspepsia   . UTI (lower urinary tract infection)   . Diverticulosis   . Hemorrhoids   . Vitamin D deficiency   . Fecal incontinence   . Aortic atherosclerosis ([place])   . Hiatal hernia   . Gastritis     Encounter Details:     CNP Questionnaire - 05/23/15 0830    Patient Demographics   Is this a new or existing patient? Existing   Patient is considered a/an Immigrant   Race Asian   Patient Assistance   Location of Patient Assistance Not Applicable  clinic   Patient's financial/insurance status Medicare   Uninsured Patient No   Interventions Appt. has been completed   Patient referred to apply for the following financial assistance Not Applicable   Food insecurities addressed Not Applicable   Transportation assistance Yes   Type of Assistance Volunteer   Assistance securing medications No   Type of Assistance Other  ride and translate   Educational health offerings Navigating the healthcare system   Encounter Details   Primary purpose of visit [place]   Was an Emergency Department visit averted? Not Applicable   Does patient have a medical provider? Yes   Patient referred to Not Applicable   Was a mental health screening completed? (GAINS tool) No   Does patient have dental issues? No   Does patient have vision issues? No   Since previous encounter, have you referred patient for abnormal blood pressure that resulted in a new diagnosis or medication change? No   Since previous encounter, have you referred patient for abnormal blood glucose that resulted in a new diagnosis or medication change? No   For Abstraction Use Only   Does patient have  insurance? Yes     checked skin mole on the face and body, PA said it was not cancer, the mole on the face was irritatin, cryo tx done. If not smooth skin in 4weeks, come back to do it again. Explained procedure and tx, post care done.

## 2015-05-27 ENCOUNTER — Encounter: Payer: Self-pay | Admitting: Physician Assistant

## 2015-05-27 ENCOUNTER — Ambulatory Visit (INDEPENDENT_AMBULATORY_CARE_PROVIDER_SITE_OTHER): Payer: Medicare Other | Admitting: Physician Assistant

## 2015-05-27 VITALS — BP 106/68 | HR 56 | Ht 58.5 in | Wt 119.0 lb

## 2015-05-27 DIAGNOSIS — R195 Other fecal abnormalities: Secondary | ICD-10-CM

## 2015-05-27 DIAGNOSIS — D509 Iron deficiency anemia, unspecified: Secondary | ICD-10-CM | POA: Diagnosis not present

## 2015-05-27 MED ORDER — NA SULFATE-K SULFATE-MG SULF 17.5-3.13-1.6 GM/177ML PO SOLN: 1.0000 | ORAL | Status: DC

## 2015-05-27 NOTE — Patient Instructions (Signed)

## 2015-05-27 NOTE — Progress Notes (Addendum)
Patient ID: Lauren [name], female   DOB: 04-02-1940, 75 y.o.   MRN: [id number]     History of Present Illness: Lauren [name] is a delightful 75 year old female who is known to Dr. Billie Lade. She has a past medical history of GERD, hiatal hernia, diverticulosis, and internal hemorrhoids. She is accompanied by her church nurse today. Apparently she was seen several months ago by her PCP and noted to have an iron deficiency anemia. She was started on iron and B-12 but continues to be anemic. She denies any change in her bowel habits or stool caliber. She has not had any bloody or tarry stools. She's had no anorexia or unexplained weight loss. She does have waves of nausea that tend to be worse on an empty stomach, temporarily alleviated with ingestion of food and then it comes back about an hour after she eats. She has not vomited. She denies early satiety. She has been getting some heartburn and regurgitation. Colonoscopy was performed for heme positive stools on 05/13/2011 and mild diverticulosis was noted in the ascending colon and sigmoid colon. Internal hemorrhoids and decreased anal sphincter tone were noted. She had an EGD in November 2012 and was noted to have mild gastritis, H. Pylori negative. Small hiatal hernia noted.   Past Medical History  Diagnosis Date  . Diabetes mellitus   . Hypertension   . Hyperlipidemia   . Hypothyroidism   . GERD (gastroesophageal reflux disease)   . Osteoporosis   . IBS (irritable bowel syndrome)   . Dyspepsia   . UTI (lower urinary tract infection)   . Diverticulosis   . Hemorrhoids   . Vitamin D deficiency   . Fecal incontinence   . Aortic atherosclerosis ([place])   . Hiatal hernia   . Gastritis     Past Surgical History  Procedure Laterality Date  . Colonoscopy     Family History  Problem Relation Age of Onset  . Colon cancer Neg Hx    Social History  Substance Use Topics  . Smoking status: Never Smoker   . Smokeless tobacco: Never Used  .  Alcohol Use: No   Current Outpatient Prescriptions  Medication Sig Dispense Refill  . alendronate (FOSAMAX) 70 MG tablet Take 70 mg by mouth every 7 (seven) days. Take with a full glass of water on an empty stomach.     Marland Kitchen aspirin 81 MG tablet Take 81 mg by mouth daily.      . Cholecalciferol (VITAMIN D3) 2000 UNITS TABS Take 1 tablet by mouth daily.    . ferrous sulfate 220 (44 Fe) MG/5ML solution Take 220 mg by mouth daily.    . folic acid (FOLVITE) 1 MG tablet Take 1 mg by mouth daily.    Marland Kitchen glucosamine-chondroitin 500-400 MG tablet Take 1 tablet by mouth 3 (three) times daily.    Marland Kitchen levothyroxine (SYNTHROID, LEVOTHROID) 75 MCG tablet Take 75 mcg by mouth daily.      Marland Kitchen losartan (COZAAR) 100 MG tablet Take 100 mg by mouth daily.      . Magnesium 400 MG CAPS Take 1 capsule by mouth daily.    . Multiple Vitamin (MULTIVITAMIN) tablet Take 1 tablet by mouth daily.    Marland Kitchen omeprazole (PRILOSEC) 40 MG capsule Take 1 capsule (40 mg total) by mouth daily. 30 capsule 1  . phenazopyridine (PYRIDIUM) 100 MG tablet Take 1 tablet (100 mg total) by mouth 3 (three) times daily as needed for pain. 10 tablet 0  . pravastatin (PRAVACHOL)  40 MG tablet Take 40 mg by mouth daily.      . sitaGLIPtan-metformin (JANUMET) 50-500 MG per tablet Take 1 tablet by mouth 2 (two) times daily with a meal.      . vitamin B-12 (CYANOCOBALAMIN) 100 MCG tablet Take 100 mcg by mouth daily.    . Na Sulfate-K Sulfate-Mg Sulf SOLN Take 1 kit by mouth as directed. 354 mL 0   No current facility-administered medications for this visit.   Allergies  Allergen Reactions  . Ciprofloxacin Hcl     Sx's almost like a heart attack  . Esomeprazole Magnesium Nausea Only    Pain and nausea  . Macrodantin [Nitrofurantoin Macrocrystal] Other (See Comments)    GI Upset  . Pantoprazole Sodium Nausea Only    abd pain and nausea     Review of Systems: Gen: Denies any fever, chills, sweats, anorexia, fatigue, weakness, malaise, weight loss,  and sleep disorder CV: Denies chest pain, angina, palpitations, syncope, orthopnea, PND, peripheral edema, and claudication. Resp: Denies dyspnea at rest, dyspnea with exercise, cough, sputum, wheezing, coughing up blood, and pleurisy. GI: Denies vomiting blood, jaundice, and fecal incontinence.   Denies dysphagia or odynophagia. GU : Denies urinary burning, blood in urine, urinary frequency, urinary hesitancy, nocturnal urination, and urinary incontinence. MS: Denies joint pain, limitation of movement, and swelling, stiffness, low back pain, extremity pain. Denies muscle weakness, cramps, atrophy.  Derm: Denies rash, itching, dry skin, hives, moles, warts, or unhealing ulcers.  Psych: Denies depression, anxiety, memory loss, suicidal ideation, hallucinations, paranoia, and confusion. Heme: Denies bruising, bleeding, and enlarged lymph nodes. Neuro:  Denies any headaches, dizziness, paresthesia Endo:  Denies any problems with DM, thyroid, adrenal  LAB RESULTS: CBC 01/31/2015 had WBC 6.8, hemoglobin 9.8, hematocrit 29.2, MCV 89.4, platelets 256,000. CBC 05/08/2015 WBC 5.3, hemoglobin 8.4, hematocrit 26.7, platelets 236,000. MCV 107.7.   Physical Exam: BP 106/68 mmHg  Pulse 56  Ht 4' 10.5" (1.486 m)  Wt 119 lb (53.978 kg)  BMI 24.44 kg/m2 General: Pleasant, well developed , female in no acute distress Head: Normocephalic and atraumatic Eyes:  sclerae anicteric, conjunctiva pink  Ears: Normal auditory acuity Lungs: Clear throughout to auscultation Heart: Regular rate and rhythm Abdomen: Soft, non distended, non-tender. No masses, no hepatomegaly. Normal bowel sounds Rectal: brown stool, Hemoccult positive. Musculoskeletal: Symmetrical with no gross deformities  Extremities: No edema  Neurological: Alert oriented x 4, grossly nonfocal Psychological:  Alert and cooperative. Normal mood and affect  Assessment and Recommendations: 75 year old female with a prior history of gastritis,  noted to have anemia and heme-positive stools referred for evaluation. Patient has been advised to undergo an EGD to evaluate for recurrent gastritis, ulcer, etc. As well as colonoscopy to evaluate for polyps, neoplasia, AVMs, etc.The risks, benefits, and alternatives to endoscopy with possible biopsy and possible dilation were discussed with the patient and they consent to proceed. The risks, benefits, and alternatives to colonoscopy with possible biopsy and possible polypectomy were discussed with the patient and they consent to proceed.  The procedures will be scheduled with Dr. Hilarie Fredrickson. If the above procedures are nonrevealing, she may be a candidate for a video capsule endoscopy. Further recommendations will be made pending the findings of the above.    Amarri Michaelson, Deloris Ping 05/27/2015,  Addendum: Reviewed and agree with initial management. Jerene Bears, MD

## 2015-05-30 ENCOUNTER — Encounter: Payer: Self-pay | Admitting: *Deleted

## 2015-05-30 ENCOUNTER — Telehealth: Payer: Self-pay | Admitting: *Deleted

## 2015-05-30 DIAGNOSIS — D649 Anemia, unspecified: Secondary | ICD-10-CM

## 2015-05-30 NOTE — Congregational Nurse Program (Unsigned)
Congregational Nurse Program Note  Date of Encounter: 05/30/2015  Past Medical History: Past Medical History  Diagnosis Date  . Diabetes mellitus   . Hypertension   . Hyperlipidemia   . Hypothyroidism   . GERD (gastroesophageal reflux disease)   . Osteoporosis   . IBS (irritable bowel syndrome)   . Dyspepsia   . UTI (lower urinary tract infection)   . Diverticulosis   . Hemorrhoids   . Vitamin D deficiency   . Fecal incontinence   . Aortic atherosclerosis ([place])   . Hiatal hernia   . Gastritis     Encounter Details:

## 2015-06-03 ENCOUNTER — Ambulatory Visit (AMBULATORY_SURGERY_CENTER): Payer: Medicare Other | Admitting: Internal Medicine

## 2015-06-03 ENCOUNTER — Encounter: Payer: Self-pay | Admitting: Internal Medicine

## 2015-06-03 VITALS — BP 106/54 | HR 64 | Temp 98.7°F | Resp 19 | Ht 58.5 in | Wt 119.0 lb

## 2015-06-03 DIAGNOSIS — D123 Benign neoplasm of transverse colon: Secondary | ICD-10-CM | POA: Diagnosis not present

## 2015-06-03 DIAGNOSIS — D509 Iron deficiency anemia, unspecified: Secondary | ICD-10-CM | POA: Diagnosis not present

## 2015-06-03 DIAGNOSIS — R195 Other fecal abnormalities: Secondary | ICD-10-CM

## 2015-06-03 MED ORDER — SODIUM CHLORIDE 0.9 % IV SOLN: 500.0000 mL | INTRAVENOUS | Status: DC

## 2015-06-03 NOTE — Op Note (Signed)
[place] Patient Name: Jacqua Steere Procedure Date: 06/03/2015 2:00 PM MRN: [id number] Endoscopist: Jerene Bears , MD Age: 75 Referring MD:  Date of Birth: 15-Sep-1940 Gender: Female Procedure:                Colonoscopy Indications:              Heme positive stool, Iron deficiency anemia Medicines:                Monitored Anesthesia Care Procedure:                Pre-Anesthesia Assessment:                           - Prior to the procedure, a History and Physical                            was performed, and patient medications and                            allergies were reviewed. The patient's tolerance of                            previous anesthesia was also reviewed. The risks                            and benefits of the procedure and the sedation                            options and risks were discussed with the patient.                            All questions were answered, and informed consent                            was obtained. Prior Anticoagulants: The patient has                            taken no previous anticoagulant or antiplatelet                            agents. ASA Grade Assessment: III - A patient with                            severe systemic disease. After reviewing the risks                            and benefits, the patient was deemed in                            satisfactory condition to undergo the procedure.                           - Prior to the procedure, a History and Physical  was performed, and patient medications and                            allergies were reviewed. The patient's tolerance of                            previous anesthesia was also reviewed. The risks                            and benefits of the procedure and the sedation                            options and risks were discussed with the patient.                            All questions were answered, and informed consent                             was obtained. Prior Anticoagulants: The patient has                            taken no previous anticoagulant or antiplatelet                            agents. ASA Grade Assessment: III - A patient with                            severe systemic disease. After reviewing the risks                            and benefits, the patient was deemed in                            satisfactory condition to undergo the procedure.                           After obtaining informed consent, the colonoscope                            was passed under direct vision. Throughout the                            procedure, the patient's blood pressure, pulse, and                            oxygen saturations were monitored continuously. The                            Model PCF-H190L [id number]) scope was introduced                            through the anus and advanced to the the cecum,  identified by appendiceal orifice and ileocecal                            valve. The Model PCF-H190L [id number]) scope was                            introduced through the and advanced to the. The                            colonoscopy was performed without difficulty. The                            patient tolerated the procedure well. The quality                            of the bowel preparation was good. The ileocecal                            valve, appendiceal orifice, and rectum were                            photographed. Scope In: 2:29:40 PM Scope Out: 2:45:03 PM Scope Withdrawal Time: 0 hours 11 minutes 17 seconds  Total Procedure Duration: 0 hours 15 minutes 23 seconds  Findings:      The digital rectal exam was normal.      A 3 mm polyp was found in the hepatic flexure. The polyp was sessile.       The polyp was removed with a cold snare. Resection and retrieval were       complete.      Multiple small-mouthed diverticula were found in the  transverse colon,       hepatic flexure and ascending colon.      The exam was otherwise without abnormality on direct and retroflexion       views. Complications:            No immediate complications. Estimated Blood Loss:     Estimated blood loss was minimal. Impression:               - One 3 mm polyp at the hepatic flexure, removed                            with a cold snare. Resected and retrieved.                           - Mild diverticulosis in the transverse colon, at                            the hepatic flexure and in the ascending colon.                           - The examination was otherwise normal on direct                            and retroflexion views. Recommendation:           -  Patient has a contact number available for                            emergencies. The signs and symptoms of potential                            delayed complications were discussed with the                            patient. Return to normal activities tomorrow.                            Written discharge instructions were provided to the                            patient.                           - Resume previous diet.                           - Continue present medications.                           - Await pathology results.                           - To visualize the small bowel for explanation of                            iron deficiency anemia and heme positive stools,                            perform video capsule endoscopy at appointment to                            be scheduled.                           - Repeat colonoscopy is recommended for                            surveillance. The colonoscopy date will be                            determined after pathology results from today's                            exam become available for review. Procedure Code(s):        --- Professional ---                           [id number], Colonoscopy, flexible; with removal of                             tumor(s), polyp(s), or other lesion(s) by snare  technique CPT copyright 2016 American Medical Association. All rights reserved. Lajuan Lines. Hilarie Fredrickson, MD Jerene Bears, MD 06/03/2015 2:57:19 PM This report has been signed electronically. Number of Addenda: 0 Referring MD:      Dimple Casey

## 2015-06-03 NOTE — Op Note (Signed)
[place] Patient Name: Lauren [name] Procedure Date: 06/03/2015 2:00 PM MRN: [id number] Endoscopist: Jerene Bears , MD Age: 75 Referring MD:  Date of Birth: Aug 03, 1940 Gender: Female Procedure:                Upper GI endoscopy Indications:              Iron deficiency anemia, Heme positive stool Medicines:                Monitored Anesthesia Care Procedure:                Pre-Anesthesia Assessment:                           - Prior to the procedure, a History and Physical                            was performed, and patient medications and                            allergies were reviewed. The patient's tolerance of                            previous anesthesia was also reviewed. The risks                            and benefits of the procedure and the sedation                            options and risks were discussed with the patient.                            All questions were answered, and informed consent                            was obtained. Prior Anticoagulants: The patient has                            taken no previous anticoagulant or antiplatelet                            agents. ASA Grade Assessment: III - A patient with                            severe systemic disease. After reviewing the risks                            and benefits, the patient was deemed in                            satisfactory condition to undergo the procedure.                           After obtaining informed consent, the endoscope was  passed under direct vision. Throughout the                            procedure, the patient's blood pressure, pulse, and                            oxygen saturations were monitored continuously. The                            Model GIF-HQ190 [id number]) scope was introduced                            through the mouth, and advanced to the second part                            of duodenum. The upper GI  endoscopy was                            accomplished without difficulty. The patient                            tolerated the procedure well. Findings:      The examined esophagus was normal.      The entire examined stomach was normal.      The examined duodenum was normal. Biopsies for histology were taken with       a cold forceps for evaluation of celiac disease. Complications:            No immediate complications. Estimated Blood Loss:     Estimated blood loss was minimal. Impression:               - Normal esophagus.                           - Normal stomach.                           - Normal examined duodenum. Biopsied.                           - No source found to explain iron deficiency and                            heme positive stools. Recommendation:           - Patient has a contact number available for                            emergencies. The signs and symptoms of potential                            delayed complications were discussed with the                            patient. Return to normal activities tomorrow.  Written discharge instructions were provided to the                            patient.                           - Resume previous diet.                           - Continue present medications.                           - Await pathology results.                           - Perform a colonoscopy today. Procedure Code(s):        --- Professional ---                           [phone], Esophagogastroduodenoscopy, flexible,                            transoral; with biopsy, single or multiple CPT copyright 2016 American Medical Association. All rights reserved. Lajuan Lines. Hilarie Fredrickson, MD Jerene Bears, MD 06/03/2015 2:52:23 PM This report has been signed electronically. Number of Addenda: 0 Referring MD:      Dimple Casey

## 2015-06-03 NOTE — Progress Notes (Signed)
Called to room to assist during endoscopic procedure.  Patient ID and intended procedure confirmed with present staff. Received instructions for my participation in the procedure from the performing physician.  

## 2015-06-03 NOTE — Patient Instructions (Signed)
YOU HAD AN ENDOSCOPIC PROCEDURE TODAY AT [facility]:   Refer to the procedure report that was given to you for any specific questions about what was found during the examination.  If the procedure report does not answer your questions, please call your gastroenterologist to clarify.  If you requested that your care partner not be given the details of your procedure findings, then the procedure report has been included in a sealed envelope for you to review at your convenience later.  YOU SHOULD EXPECT: Some feelings of bloating in the abdomen. Passage of more gas than usual.  Walking can help get rid of the air that was put into your GI tract during the procedure and reduce the bloating. If you had a lower endoscopy (such as a colonoscopy or flexible sigmoidoscopy) you may notice spotting of blood in your stool or on the toilet paper. If you underwent a bowel prep for your procedure, you may not have a normal bowel movement for a few days.  Please Note:  You might notice some irritation and congestion in your nose or some drainage.  This is from the oxygen used during your procedure.  There is no need for concern and it should clear up in a day or so.  SYMPTOMS TO REPORT IMMEDIATELY:   Following lower endoscopy (colonoscopy or flexible sigmoidoscopy):  Excessive amounts of blood in the stool  Significant tenderness or worsening of abdominal pains  Swelling of the abdomen that is new, acute  Fever of 100F or higher   Following upper endoscopy (EGD)  Vomiting of blood or coffee ground material  New chest pain or pain under the shoulder blades  Painful or persistently difficult swallowing  New shortness of breath  Fever of 100F or higher  Black, tarry-looking stools  For urgent or emergent issues, a gastroenterologist can be reached at any hour by calling [phone].   DIET: Your first meal following the procedure should be a small meal and then it is ok to progress to  your normal diet. Heavy or fried foods are harder to digest and may make you feel nauseous or bloated.  Likewise, meals heavy in dairy and vegetables can increase bloating.  Drink plenty of fluids but you should avoid alcoholic beverages for 24 hours. Try to increase the fiber in your diet.  Drink plenty of fluid.  ACTIVITY:  You should plan to take it easy for the rest of today and you should NOT DRIVE or use heavy machinery until tomorrow (because of the sedation medicines used during the test).    FOLLOW UP: Our staff will call the number listed on your records the next business day following your procedure to check on you and address any questions or concerns that you may have regarding the information given to you following your procedure. If we do not reach you, we will leave a message.  However, if you are feeling well and you are not experiencing any problems, there is no need to return our call.  We will assume that you have returned to your regular daily activities without incident.  If any biopsies were taken you will be contacted by phone or by letter within the next 1-3 weeks.  Please call us at [phone] if you have not heard about the biopsies in 3 weeks.    SIGNATURES/CONFIDENTIALITY: You and/or your care partner have signed paperwork which will be entered into your electronic medical record.  These signatures attest to the fact  that that the information above on your After Visit Summary has been reviewed and is understood.  Full responsibility of the confidentiality of this discharge information lies with you and/or your care-partner.  Read all of the handouts given to you by your recovery room nurse.  Thank-you for choosing [facility] for your healthcare today. We will set you up to do the pill camera. The staff on the 3rd floor will call you within the week to set it up.

## 2015-06-03 NOTE — Progress Notes (Signed)
Report to PACU, RN, vss, BBS= Clear.  

## 2015-06-04 ENCOUNTER — Telehealth: Payer: Self-pay | Admitting: *Deleted

## 2015-06-04 ENCOUNTER — Telehealth: Payer: Self-pay | Admitting: Emergency Medicine

## 2015-06-04 ENCOUNTER — Encounter: Payer: Self-pay | Admitting: *Deleted

## 2015-06-04 DIAGNOSIS — D649 Anemia, unspecified: Secondary | ICD-10-CM

## 2015-06-04 NOTE — Congregational Nurse Program (Signed)
Congregational Nurse Program Note  Date of Encounter: 06/04/2015  Past Medical History: Past Medical History  Diagnosis Date  . Diabetes mellitus   . Hypertension   . Hyperlipidemia   . Hypothyroidism   . GERD (gastroesophageal reflux disease)   . Osteoporosis   . IBS (irritable bowel syndrome)   . Dyspepsia   . UTI (lower urinary tract infection)   . Diverticulosis   . Hemorrhoids   . Vitamin D deficiency   . Fecal incontinence   . Aortic atherosclerosis ([place])   . Hiatal hernia   . Gastritis     Encounter Details:     CNP Questionnaire - 06/03/15 1700    Patient Demographics   Is this a new or existing patient? Existing   Patient is considered a/an Immigrant   Race Asian   Patient Assistance   Location of Patient Assistance Not Applicable  endoscopy& colonoscopy   Patient's financial/insurance status Medicare   Uninsured Patient No   Interventions Appt. has been completed   Patient referred to apply for the following financial assistance Not Applicable   Food insecurities addressed Not Applicable   Transportation assistance Yes   Type of Assistance Volunteer   Assistance securing medications Yes  prep for endoscopy   Type of Assistance Other  ride, translate   Educational health offerings Other   Encounter Details   Primary purpose of visit [place]   Was an Emergency Department visit averted? Not Applicable   Does patient have a medical provider? No   Patient referred to Not Applicable   Was a mental health screening completed? (GAINS tool) No   Does patient have dental issues? No   Does patient have vision issues? No   Since previous encounter, have you referred patient for abnormal blood pressure that resulted in a new diagnosis or medication change? No   Since previous encounter, have you referred patient for abnormal blood glucose that resulted in a new diagnosis or medication change? No   For Abstraction Use Only   Does  patient have insurance? Yes     rides given and translate for endoscopy and colonoscopy done. Explained result of test,  Pill of camera test will be done next step.  Stayed with her until completely recover anesthesia. Will check tonight and tomorrow.

## 2015-06-04 NOTE — Telephone Encounter (Signed)
  Follow up Call-  Call back number 06/03/2015  Post procedure Call Back phone  # [phone]  Permission to leave phone message Yes     Patient questions:  Do you have a fever, pain , or abdominal swelling? No. Pain Score  0 *  Have you tolerated food without any problems? Yes.    Have you been able to return to your normal activities? Yes.    Do you have any questions about your discharge instructions: Diet   No. Medications  No. Follow up visit  No.  Do you have questions or concerns about your Care? No.  Actions: * If pain score is 4 or above: No action needed, pain <4.

## 2015-06-08 ENCOUNTER — Telehealth: Payer: Self-pay | Admitting: *Deleted

## 2015-06-09 ENCOUNTER — Encounter: Payer: Self-pay | Admitting: Internal Medicine

## 2015-06-10 ENCOUNTER — Encounter: Payer: Self-pay | Admitting: *Deleted

## 2015-06-10 DIAGNOSIS — D649 Anemia, unspecified: Secondary | ICD-10-CM

## 2015-06-10 NOTE — Congregational Nurse Program (Unsigned)
Congregational Nurse Program Note  Date of Encounter: 06/10/2015  Past Medical History: Past Medical History  Diagnosis Date  . Diabetes mellitus   . Hypertension   . Hyperlipidemia   . Hypothyroidism   . GERD (gastroesophageal reflux disease)   . Osteoporosis   . IBS (irritable bowel syndrome)   . Dyspepsia   . UTI (lower urinary tract infection)   . Diverticulosis   . Hemorrhoids   . Vitamin D deficiency   . Fecal incontinence   . Aortic atherosclerosis ([place])   . Hiatal hernia   . Gastritis     Encounter Details:     CNP Questionnaire - 06/09/15 0900    Patient Demographics   Is this a new or existing patient? Existing   Patient is considered a/an Immigrant   Race Asian   Patient Assistance   Location of Patient Assistance Not Applicable  lab   Patient's financial/insurance status Medicare   Uninsured Patient No   Interventions Appt. has been completed  translated   Patient referred to apply for the following financial assistance Not Applicable   Food insecurities addressed Not Applicable   Transportation assistance Yes   Type of Assistance Volunteer   Assistance securing medications No   Type of Assistance Other  ride & translate   Educational health offerings Not Applicable   Encounter Details   Primary purpose of visit Other  lab   Was an Emergency Department visit averted? Not Applicable   Does patient have a medical provider? No   Patient referred to Not Applicable   Was a mental health screening completed? (GAINS tool) No   Does patient have dental issues? No   Does patient have vision issues? No   Since previous encounter, have you referred patient for abnormal blood pressure that resulted in a new diagnosis or medication change? No   For Abstraction Use Only   Does patient have insurance? Yes  medicare      Call & ride+ translate for labs, unable to get urine specimen, will bring it later. Reminded her next appointment is 06/16/2015 10:15a

## 2015-06-15 ENCOUNTER — Telehealth: Payer: Self-pay | Admitting: *Deleted

## 2015-06-16 ENCOUNTER — Telehealth: Payer: Self-pay | Admitting: Internal Medicine

## 2015-06-16 DIAGNOSIS — D509 Iron deficiency anemia, unspecified: Secondary | ICD-10-CM

## 2015-06-17 ENCOUNTER — Encounter: Payer: Self-pay | Admitting: *Deleted

## 2015-06-17 DIAGNOSIS — D649 Anemia, unspecified: Secondary | ICD-10-CM

## 2015-06-17 NOTE — Congregational Nurse Program (Signed)
Congregational Nurse Program Note  Date of Encounter: 06/17/2015  Past Medical History: Past Medical History  Diagnosis Date  . Diabetes mellitus   . Hypertension   . Hyperlipidemia   . Hypothyroidism   . GERD (gastroesophageal reflux disease)   . Osteoporosis   . IBS (irritable bowel syndrome)   . Dyspepsia   . UTI (lower urinary tract infection)   . Diverticulosis   . Hemorrhoids   . Vitamin D deficiency   . Fecal incontinence   . Aortic atherosclerosis ([place])   . Hiatal hernia   . Gastritis     Encounter Details:     CNP Questionnaire - 06/16/15 1330    Patient Demographics   Is this a new or existing patient? Existing   Patient is considered a/an Immigrant   Race Asian   Patient Assistance   Location of Patient Assistance Not Applicable  Dr's office   Patient's financial/insurance status Medicare   Uninsured Patient No   Interventions Appt. has been completed   Patient referred to apply for the following financial assistance Not Applicable   Food insecurities addressed Not Applicable   Transportation assistance No   Type of Assistance Volunteer   Assistance securing medications Yes   Type of Assistance Other   Educational health offerings Navigating the healthcare system  Dr. Maudie Mercury   Encounter Details   Primary purpose of visit [place]   Was an Emergency Department visit averted? Not Applicable   Does patient have a medical provider? Yes   Patient referred to Not Applicable   Was a mental health screening completed? (GAINS tool) No   Does patient have dental issues? No   Does patient have vision issues? No   Since previous encounter, have you referred patient for abnormal blood pressure that resulted in a new diagnosis or medication change? No   Since previous encounter, have you referred patient for abnormal blood glucose that resulted in a new diagnosis or medication change? No   For Abstraction Use Only   Does patient have  insurance? Yes     f/u Dr's appointment. Hgb up to 9.4. Continue to taking Iron, [id number], folic acid per order. Stopped by Dr Vena Rua office for capsule study, but not available due to lunch time, left message to call me back with appointment date, waiting for call back from office.

## 2015-06-17 NOTE — Telephone Encounter (Signed)
Left message to call back.  Pt scheduled for capsule endo 07/01/15@8am . Instructions reviewed over the phone and mailed to pt. Referral in epic.

## 2015-06-19 ENCOUNTER — Telehealth: Payer: Self-pay | Admitting: *Deleted

## 2015-07-01 ENCOUNTER — Ambulatory Visit (INDEPENDENT_AMBULATORY_CARE_PROVIDER_SITE_OTHER): Payer: Medicare Other | Admitting: Internal Medicine

## 2015-07-01 DIAGNOSIS — D509 Iron deficiency anemia, unspecified: Secondary | ICD-10-CM | POA: Diagnosis not present

## 2015-07-01 NOTE — Progress Notes (Signed)
Pt tolerated procedure well, verbalized understanding of all written and verbal instructions  ID [id number] EXP 04/24/2016

## 2015-07-08 ENCOUNTER — Encounter: Payer: Self-pay | Admitting: *Deleted

## 2015-07-08 ENCOUNTER — Telehealth: Payer: Self-pay

## 2015-07-08 DIAGNOSIS — T189XXA Foreign body of alimentary tract, part unspecified, initial encounter: Secondary | ICD-10-CM

## 2015-07-08 DIAGNOSIS — D649 Anemia, unspecified: Secondary | ICD-10-CM

## 2015-07-08 NOTE — Congregational Nurse Program (Unsigned)
Congregational Nurse Program Note  Date of Encounter: 07/08/2015  Past Medical History: Past Medical History  Diagnosis Date  . Diabetes mellitus   . Hypertension   . Hyperlipidemia   . Hypothyroidism   . GERD (gastroesophageal reflux disease)   . Osteoporosis   . IBS (irritable bowel syndrome)   . Dyspepsia   . UTI (lower urinary tract infection)   . Diverticulosis   . Hemorrhoids   . Vitamin D deficiency   . Fecal incontinence   . Aortic atherosclerosis ([place])   . Hiatal hernia   . Gastritis     Encounter Details:     CNP Questionnaire - 07/01/15 1800    Patient Demographics   Is this a new or existing patient? Existing   Patient is considered a/an Immigrant   Patient Assistance   Location of Patient Assistance Not Applicable  Dr. Vena Rua office   Patient's financial/insurance status Medicare   Uninsured Patient No   Interventions Appt. has been completed  capsule study   Patient referred to apply for the following financial assistance Not Applicable   Food insecurities addressed Not Applicable   Transportation assistance Yes  round trip to dr's office X2   Type of Assistance Volunteer   Assistance securing medications No   Type of Assistance Other  Dr's office to set up monitor, procedure, removal of monitor, translated   Educational health offerings Other  instrction translated   Encounter Details   Primary purpose of visit Other   Was an Emergency Department visit averted? Not Applicable   Does patient have a medical provider? Yes   Patient referred to Not Applicable   Was a mental health screening completed? (GAINS tool) No   Does patient have dental issues? No   Does patient have vision issues? No   Since previous encounter, have you referred patient for abnormal blood pressure that resulted in a new diagnosis or medication change? No   Since previous encounter, have you referred patient for abnormal blood glucose that resulted in a new diagnosis or  medication change? No   For Abstraction Use Only   Does patient have insurance? Yes    pt had been round trip to Dr's office for capsule study. 8am, at Dr's office, monitor set up and swallowed camera, then back to office @ 1600, removed. Pt. Tolerated well . Stay with pt all day till 1800. Post study instruction given to check bowels move for camera come out.

## 2015-07-08 NOTE — Congregational Nurse Program (Unsigned)
Congregational Nurse Program Note  Date of Encounter: 07/08/2015  Past Medical History: Past Medical History  Diagnosis Date  . Diabetes mellitus   . Hypertension   . Hyperlipidemia   . Hypothyroidism   . GERD (gastroesophageal reflux disease)   . Osteoporosis   . IBS (irritable bowel syndrome)   . Dyspepsia   . UTI (lower urinary tract infection)   . Diverticulosis   . Hemorrhoids   . Vitamin D deficiency   . Fecal incontinence   . Aortic atherosclerosis ([place])   . Hiatal hernia   . Gastritis     Encounter Details:     CNP Questionnaire - 06/30/15 1000    Patient Demographics   Is this a new or existing patient? Existing   Patient is considered a/an Immigrant   Race Asian   Patient Assistance   Location of Patient Assistance Not Applicable  home visit   Patient's financial/insurance status Medicare   Uninsured Patient No   Interventions Follow-up/Education/Support provided after completed appt.  remind prep for capsule study gor 07/01/2015,  show how to mix med.   Patient referred to apply for the following financial assistance Not Applicable   Food insecurities addressed Not Applicable   Transportation assistance No   Type of Assistance Volunteer   Assistance securing medications Yes   Type of Assistance Other  medicin prepare for prep   Educational health offerings Other  npo after MN, liquids only after 12 noon today   Encounter Details   Primary purpose of visit Other   Was an Emergency Department visit averted? Not Applicable   Does patient have a medical provider? Yes   Patient referred to Not Applicable   Was a mental health screening completed? (GAINS tool) No   Does patient have dental issues? No   Does patient have vision issues? No   Since previous encounter, have you referred patient for abnormal blood pressure that resulted in a new diagnosis or medication change? No   Since previous encounter, have you referred patient for abnormal blood glucose  that resulted in a new diagnosis or medication change? No   For Abstraction Use Only   Does patient have insurance? Yes      Home visit  Bought Mirlaxa and 32 oz gatoray per prep. Capsule study for next day. instruction given.

## 2015-07-08 NOTE — Telephone Encounter (Signed)
-----   Message from Barron Alvine, RN sent at 07/02/2015  9:04 AM EDT ----- Pt needs to have xray if not sure about capsule passing

## 2015-07-08 NOTE — Telephone Encounter (Signed)
Left message on machine to call back  

## 2015-07-09 ENCOUNTER — Other Ambulatory Visit: Payer: Self-pay

## 2015-07-09 ENCOUNTER — Telehealth: Payer: Self-pay

## 2015-07-09 DIAGNOSIS — D509 Iron deficiency anemia, unspecified: Secondary | ICD-10-CM

## 2015-07-09 NOTE — Telephone Encounter (Signed)
Left message on machine to call back  

## 2015-07-09 NOTE — Telephone Encounter (Signed)
Left message for pt to call back. Capsule endo was a complete study and normal, no source of blood loss found. Pt needs to come for labs. Orders in epic.

## 2015-07-10 NOTE — Telephone Encounter (Signed)
The pt's daughter states she is not sure whether the capsule passed or not, I will send to Amy for the x ray order to verify that the capsule passed.  Amy, the pt will come in today or tomorrow.

## 2015-07-10 NOTE — Telephone Encounter (Signed)
Order in EPIC pt to have xray

## 2015-07-10 NOTE — Telephone Encounter (Signed)
Left message for Lauren [name] to call back.

## 2015-07-10 NOTE — Telephone Encounter (Signed)
Ok, study was complete so suspect she has passed, ok to do kub to confirm

## 2015-07-10 NOTE — Telephone Encounter (Signed)
Spoke with pts daughter and she is aware and will come for labs.

## 2015-07-11 ENCOUNTER — Telehealth: Payer: Self-pay | Admitting: *Deleted

## 2015-07-11 ENCOUNTER — Encounter: Payer: Self-pay | Admitting: Internal Medicine

## 2015-07-11 ENCOUNTER — Other Ambulatory Visit (INDEPENDENT_AMBULATORY_CARE_PROVIDER_SITE_OTHER): Payer: Medicare Other

## 2015-07-11 ENCOUNTER — Ambulatory Visit (INDEPENDENT_AMBULATORY_CARE_PROVIDER_SITE_OTHER)
Admission: RE | Admit: 2015-07-11 | Discharge: 2015-07-11 | Disposition: A | Discharge status: Home / Self Care | Payer: Medicare Other | Source: Ambulatory Visit | Attending: Physician Assistant | Admitting: Physician Assistant

## 2015-07-11 ENCOUNTER — Encounter: Payer: Self-pay | Admitting: *Deleted

## 2015-07-11 DIAGNOSIS — D509 Iron deficiency anemia, unspecified: Secondary | ICD-10-CM | POA: Diagnosis not present

## 2015-07-11 DIAGNOSIS — T189XXA Foreign body of alimentary tract, part unspecified, initial encounter: Secondary | ICD-10-CM

## 2015-07-11 DIAGNOSIS — D649 Anemia, unspecified: Secondary | ICD-10-CM

## 2015-07-11 LAB — CBC WITH DIFFERENTIAL/PLATELET
Basophils Absolute: 0.1 K/uL (ref 0.0–0.1)
Basophils Relative: 1 % (ref 0.0–3.0)
Eosinophils Absolute: 0.1 K/uL (ref 0.0–0.7)
Eosinophils Relative: 1.8 % (ref 0.0–5.0)
HCT: 34.5 % — ABNORMAL LOW (ref 36.0–46.0)
Hemoglobin: 11.6 g/dL — ABNORMAL LOW (ref 12.0–15.0)
Lymphocytes Relative: 26.7 % (ref 12.0–46.0)
Lymphs Abs: 1.5 K/uL (ref 0.7–4.0)
MCHC: 33.6 g/dL (ref 30.0–36.0)
MCV: 99.9 fl (ref 78.0–100.0)
Monocytes Absolute: 0.5 K/uL (ref 0.1–1.0)
Monocytes Relative: 9.2 % (ref 3.0–12.0)
Neutro Abs: 3.4 K/uL (ref 1.4–7.7)
Neutrophils Relative %: 61.3 % (ref 43.0–77.0)
Platelets: 183 K/uL (ref 150.0–400.0)
RBC: 3.45 Mil/uL — ABNORMAL LOW (ref 3.87–5.11)
RDW: 12.7 % (ref 11.5–15.5)
WBC: 5.5 K/uL (ref 4.0–10.5)

## 2015-07-11 LAB — IBC PANEL
Iron: 52 ug/dL (ref 42–145)
Saturation Ratios: 14.3 % — ABNORMAL LOW (ref 20.0–50.0)
Transferrin: 259 mg/dL (ref 212.0–360.0)

## 2015-07-11 LAB — FERRITIN: FERRITIN: 16.2 ng/mL (ref 10.0–291.0)

## 2015-07-11 NOTE — Congregational Nurse Program (Unsigned)
Congregational Nurse Program Note  Date of Encounter: 07/11/2015  Past Medical History: Past Medical History  Diagnosis Date  . Diabetes mellitus   . Hypertension   . Hyperlipidemia   . Hypothyroidism   . GERD (gastroesophageal reflux disease)   . Osteoporosis   . IBS (irritable bowel syndrome)   . Dyspepsia   . UTI (lower urinary tract infection)   . Diverticulosis   . Hemorrhoids   . Vitamin D deficiency   . Fecal incontinence   . Aortic atherosclerosis ([place])   . Hiatal hernia   . Gastritis     Encounter Details:     CNP Questionnaire - 07/11/15 0930    Patient Demographics   Is this a new or existing patient? Existing   Patient is considered a/an Immigrant   Race Asian   Patient Assistance   Location of Patient Assistance Not Applicable  x-ray & LABS @ [facility]   Patient's financial/insurance status Medicare   Uninsured Patient No   Interventions Appt. has been completed   Patient referred to apply for the following financial assistance Not Applicable   Food insecurities addressed Not Applicable   Transportation assistance Yes   Type of Assistance Volunteer   Assistance securing medications No   Type of Assistance Other  [place]   Educational health offerings Spiritual care;Navigating the healthcare system   Encounter Details   Primary purpose of visit [place];Other  X-RAY AND LAB   Was an Emergency Department visit averted? Not Applicable   Does patient have a medical provider? Yes   Patient referred to Not Applicable   Was a mental health screening completed? (GAINS tool) No   Does patient have dental issues? No   Does patient have vision issues? No   Since previous encounter, have you referred patient for abnormal blood pressure that resulted in a new diagnosis or medication change? No   Since previous encounter, have you referred patient for abnormal blood glucose that resulted in a new diagnosis or medication  change? No   For Abstraction Use Only   Does patient have insurance? Yes     Not sure camera came out or not. X-ray abd and labs dered per MD, gave rides and translated. Later Dr. Vena Rua Nurse called X2 for X-ray result and Lab result. Neg. For X-ray. heg level up to 11, continue to take Iron and f/u 6 month.

## 2015-07-15 ENCOUNTER — Encounter: Payer: Self-pay | Admitting: *Deleted

## 2015-08-08 ENCOUNTER — Encounter: Payer: Self-pay | Admitting: *Deleted

## 2015-08-08 ENCOUNTER — Telehealth: Payer: Self-pay | Admitting: *Deleted

## 2015-08-08 NOTE — Congregational Nurse Program (Unsigned)
Congregational Nurse Program Note  Date of Encounter: 08/08/2015  Past Medical History: Past Medical History  Diagnosis Date  . Diabetes mellitus   . Hypertension   . Hyperlipidemia   . Hypothyroidism   . GERD (gastroesophageal reflux disease)   . Osteoporosis   . IBS (irritable bowel syndrome)   . Dyspepsia   . UTI (lower urinary tract infection)   . Diverticulosis   . Hemorrhoids   . Vitamin D deficiency   . Fecal incontinence   . Aortic atherosclerosis ([place])   . Hiatal hernia   . Gastritis     Encounter Details:     CNP Questionnaire - 07/20/15 1400    Patient Demographics   Is this a new or existing patient? Existing   Patient is considered a/an Immigrant   Race Asian   Patient Assistance   Location of Patient Assistance Not Applicable   Patient's financial/insurance status Medicare   Uninsured Patient No   Patient referred to apply for the following financial assistance Not Applicable   Food insecurities addressed Not Applicable   Transportation assistance No   Assistance securing medications No   Type of Assistance Other   Educational health offerings Safety;Spiritual care;Medications   Encounter Details   Primary purpose of visit Safety;Spiritual Care/Support Visit   Was an Emergency Department visit averted? Not Applicable   Does patient have a medical provider? Yes   Patient referred to Not Applicable   Was a mental health screening completed? (GAINS tool) No   Does patient have dental issues? No   Does patient have vision issues? No   Does your patient have an abnormal blood pressure today? No   Since previous encounter, have you referred patient for abnormal blood pressure that resulted in a new diagnosis or medication change? No  NA   Does your patient have an abnormal blood glucose today? No  na   Since previous encounter, have you referred patient for abnormal blood glucose that resulted in a new diagnosis or medication change? No  not checking,  130's per pt   Was there a life-saving intervention made? No    pt stopped by office. Notified pt regarding meds ordered & will receive meds 2-3days later. Checking bruises. Denied pain. Better for UTI sx.

## 2015-08-08 NOTE — Congregational Nurse Program (Unsigned)
Congregational Nurse Program Note  Date of Encounter: 08/08/2015  Past Medical History: Past Medical History  Diagnosis Date  . Diabetes mellitus   . Hypertension   . Hyperlipidemia   . Hypothyroidism   . GERD (gastroesophageal reflux disease)   . Osteoporosis   . IBS (irritable bowel syndrome)   . Dyspepsia   . UTI (lower urinary tract infection)   . Diverticulosis   . Hemorrhoids   . Vitamin D deficiency   . Fecal incontinence   . Aortic atherosclerosis ([place])   . Hiatal hernia   . Gastritis     Encounter Details:     CNP Questionnaire - 07/16/15 1400    Patient Demographics   Is this a new or existing patient? Existing   Patient is considered a/an Immigrant   Race Asian   Patient Assistance   Location of Patient Assistance Not Applicable   Patient's financial/insurance status Medicare   Uninsured Patient No   Patient referred to apply for the following financial assistance Not Applicable   Food insecurities addressed Not Applicable   Transportation assistance No   Assistance securing medications No   Type of Assistance Other   Educational health offerings Chronic disease;Health literacy   Encounter Details   Primary purpose of visit Education/Health Concerns;Spiritual Care/Support Visit;Other  home vs   Was an Emergency Department visit averted? Not Applicable   Does patient have a medical provider? Yes   Patient referred to Not Applicable   Was a mental health screening completed? (GAINS tool) No   Does patient have dental issues? No   Does patient have vision issues? No   Does your patient have an abnormal blood pressure today? No   Since previous encounter, have you referred patient for abnormal blood pressure that resulted in a new diagnosis or medication change? No   Does your patient have an abnormal blood glucose today? No   Since previous encounter, have you referred patient for abnormal blood glucose that resulted in a new diagnosis or medication  change? No   Was there a life-saving intervention made? No     Stopped by home to check up. UTI sx. Much better. Pyridium Q 8hrs prn on.

## 2015-08-08 NOTE — Congregational Nurse Program (Unsigned)
Congregational Nurse Program Note  Date of Encounter: 07/15/2015  Past Medical History: Past Medical History  Diagnosis Date  . Diabetes mellitus   . Hypertension   . Hyperlipidemia   . Hypothyroidism   . GERD (gastroesophageal reflux disease)   . Osteoporosis   . IBS (irritable bowel syndrome)   . Dyspepsia   . UTI (lower urinary tract infection)   . Diverticulosis   . Hemorrhoids   . Vitamin D deficiency   . Fecal incontinence   . Aortic atherosclerosis ([place])   . Hiatal hernia   . Gastritis     Encounter Details:     CNP Questionnaire - 07/15/15 1000    Patient Demographics   Is this a new or existing patient? Existing   Patient is considered a/an Immigrant   Race Asian   Patient Assistance   Location of Patient Assistance Not Applicable   Patient's financial/insurance status Medicare   Uninsured Patient No   Interventions Follow-up/Education/Support provided after completed appt.   Patient referred to apply for the following financial assistance Not Applicable   Food insecurities addressed Not Applicable   Transportation assistance No   Type of Assistance Other  medicine check up, increase po fluidd, rest   Assistance securing medications Yes   Type of Assistance Other   Educational health offerings Chronic disease;Safety;Medications;Spiritual care;Other   Encounter Details   Primary purpose of visit Chronic Illness/Condition Visit   Was an Emergency Department visit averted? Not Applicable   Does patient have a medical provider? Yes   Patient referred to Not Applicable   Was a mental health screening completed? (GAINS tool) No   Does patient have dental issues? No   Does patient have vision issues? No   Does your patient have an abnormal blood pressure today? No   Since previous encounter, have you referred patient for abnormal blood pressure that resulted in a new diagnosis or medication change? No   Does your patient have an abnormal blood glucose today?  Yes  136 per pt   Since previous encounter, have you referred patient for abnormal blood glucose that resulted in a new diagnosis or medication change? No   Was there a life-saving intervention made? No   For Abstraction Use Only   Was the patient insured? Yes     c/o very tired and UTI sx ; cloudy urine, urgency, discomfort lower abdomen. Encouraged increased fluids intake,  Rest.  Pt is on antibiotic at night time, also pt was taking pyridium for sx. relif. Will F/P.

## 2015-08-20 ENCOUNTER — Encounter: Payer: Self-pay | Admitting: *Deleted

## 2015-08-23 ENCOUNTER — Telehealth: Payer: Self-pay | Admitting: *Deleted

## 2015-08-23 NOTE — Congregational Nurse Program (Unsigned)
Congregational Nurse Program Note  Date of Encounter: 08/20/2015  Past Medical History: Past Medical History  Diagnosis Date  . Diabetes mellitus   . Hypertension   . Hyperlipidemia   . Hypothyroidism   . GERD (gastroesophageal reflux disease)   . Osteoporosis   . IBS (irritable bowel syndrome)   . Dyspepsia   . UTI (lower urinary tract infection)   . Diverticulosis   . Hemorrhoids   . Vitamin D deficiency   . Fecal incontinence   . Aortic atherosclerosis ([place])   . Hiatal hernia   . Gastritis     Encounter Details:     CNP Questionnaire - 08/20/15 1830    Patient Demographics   Is this a new or existing patient? Existing   Patient is considered a/an Immigrant   Race Asian   Patient Assistance   Location of Patient Assistance Not Applicable  home visit   Patient's financial/insurance status Medicare   Uninsured Patient No   Patient referred to apply for the following financial assistance Not Applicable   Food insecurities addressed Not Applicable   Transportation assistance No   Assistance securing medications Yes   Type of Assistance Other  home visit   Encounter Details   Primary purpose of visit Spiritual Care/Support Visit   Was an Emergency Department visit averted? Not Applicable   Does patient have a medical provider? Yes   Patient referred to Not Applicable   Was a mental health screening completed? (GAINS tool) No   Does patient have dental issues? No   Does patient have vision issues? No   Does your patient have an abnormal blood pressure today? No   Since previous encounter, have you referred patient for abnormal blood pressure that resulted in a new diagnosis or medication change? No   Does your patient have an abnormal blood glucose today? No   Since previous encounter, have you referred patient for abnormal blood glucose that resulted in a new diagnosis or medication change? No   Was there a life-saving intervention made? No     c/ogeneral  weakness, poor appetite.no energy. Visit home with food prepared.  Said everything ok ,just no energy. Will f/u  Also she is taking some cooked cow liver once a day due to Iron deficiency.

## 2015-10-07 ENCOUNTER — Encounter: Payer: Self-pay | Admitting: *Deleted

## 2015-10-07 DIAGNOSIS — Z139 Encounter for screening, unspecified: Secondary | ICD-10-CM

## 2015-10-08 ENCOUNTER — Encounter: Payer: Self-pay | Admitting: *Deleted

## 2015-10-08 DIAGNOSIS — D649 Anemia, unspecified: Secondary | ICD-10-CM | POA: Diagnosis not present

## 2015-10-08 DIAGNOSIS — E1165 Type 2 diabetes mellitus with hyperglycemia: Secondary | ICD-10-CM | POA: Diagnosis not present

## 2015-10-08 DIAGNOSIS — Z139 Encounter for screening, unspecified: Secondary | ICD-10-CM

## 2015-10-08 DIAGNOSIS — I10 Essential (primary) hypertension: Secondary | ICD-10-CM | POA: Diagnosis not present

## 2015-10-10 NOTE — Congregational Nurse Program (Unsigned)
Congregational Nurse Program Note  Date of Encounter: 10/08/2015  Past Medical History: Past Medical History:  Diagnosis Date  . Aortic atherosclerosis ([place])   . Diabetes mellitus   . Diverticulosis   . Dyspepsia   . Fecal incontinence   . Gastritis   . GERD (gastroesophageal reflux disease)   . Hemorrhoids   . Hiatal hernia   . Hyperlipidemia   . Hypertension   . Hypothyroidism   . IBS (irritable bowel syndrome)   . Osteoporosis   . UTI (lower urinary tract infection)   . Vitamin D deficiency     Encounter Details:     CNP Questionnaire - 10/08/15 0800      Patient Demographics   Is this a new or existing patient? Existing   Patient is considered a/an Immigrant   Race Asian     Patient Assistance   Location of Patient Assistance Not Applicable  [facility]   Patient's financial/insurance status Medicare   Uninsured Patient No   Patient referred to apply for the following financial assistance Not Applicable   Food insecurities addressed Not Applicable   Transportation assistance Yes   Type of Assistance Volunteer   Assistance securing medications No   Educational health offerings Not Applicable     Encounter Details   Primary purpose of visit Other   Was an Emergency Department visit averted? Not Applicable   Does patient have a medical provider? Yes   Patient referred to Not Applicable;Other (comment)  clinic to check lab   Was a mental health screening completed? (GAINS tool) No   Does patient have dental issues? No   Does patient have vision issues? No   Does your patient have an abnormal blood pressure today? No   Since previous encounter, have you referred patient for abnormal blood pressure that resulted in a new diagnosis or medication change? No   Does your patient have an abnormal blood glucose today? Yes   Since previous encounter, have you referred patient for abnormal blood glucose that resulted in a new diagnosis or medication change? No   Was there a  life-saving intervention made? No      cbg check, 146 fasting, before go to clinic to draw lab.

## 2015-10-11 LAB — GLUCOSE, POCT (MANUAL RESULT ENTRY)
POC GLUCOSE: 102 mg/dL — AB (ref 70–99)
POC GLUCOSE: 146 mg/dL — AB (ref 70–99)

## 2015-10-11 NOTE — Congregational Nurse Program (Unsigned)
Congregational Nurse Program Note  Date of Encounter: 10/07/2015  Past Medical History: Past Medical History:  Diagnosis Date  . Aortic atherosclerosis ([place])   . Diabetes mellitus   . Diverticulosis   . Dyspepsia   . Fecal incontinence   . Gastritis   . GERD (gastroesophageal reflux disease)   . Hemorrhoids   . Hiatal hernia   . Hyperlipidemia   . Hypertension   . Hypothyroidism   . IBS (irritable bowel syndrome)   . Osteoporosis   . UTI (lower urinary tract infection)   . Vitamin D deficiency     Encounter Details:     CNP Questionnaire - 10/08/15 0800      Patient Demographics   Is this a new or existing patient? Existing   Patient is considered a/an Immigrant   Race Asian     Patient Assistance   Location of Patient Assistance Not Applicable  [facility]   Patient's financial/insurance status Medicare   Uninsured Patient No   Patient referred to apply for the following financial assistance Not Applicable   Food insecurities addressed Not Applicable   Transportation assistance Yes   Type of Assistance Volunteer   Assistance securing medications No   Educational health offerings Not Applicable     Encounter Details   Primary purpose of visit Other   Was an Emergency Department visit averted? Not Applicable   Does patient have a medical provider? Yes   Patient referred to Not Applicable;Other (comment)  clinic to check lab   Was a mental health screening completed? (GAINS tool) No   Does patient have dental issues? No   Does patient have vision issues? No   Does your patient have an abnormal blood pressure today? No   Since previous encounter, have you referred patient for abnormal blood pressure that resulted in a new diagnosis or medication change? No   Does your patient have an abnormal blood glucose today? Yes   Since previous encounter, have you referred patient for abnormal blood glucose that resulted in a new diagnosis or medication change? No   Was there a  life-saving intervention made? No      Pt said her own cbg machine is  Not right. Check CBG  Non fasting, 102 in the morning 10/08/15 will go to clinc to draw lab. Before then will check CBG .

## 2015-10-16 ENCOUNTER — Encounter: Payer: Self-pay | Admitting: *Deleted

## 2015-10-16 DIAGNOSIS — E039 Hypothyroidism, unspecified: Secondary | ICD-10-CM | POA: Diagnosis not present

## 2015-10-16 DIAGNOSIS — D649 Anemia, unspecified: Secondary | ICD-10-CM | POA: Diagnosis not present

## 2015-10-16 DIAGNOSIS — H9190 Unspecified hearing loss, unspecified ear: Secondary | ICD-10-CM | POA: Diagnosis not present

## 2015-10-16 DIAGNOSIS — E1165 Type 2 diabetes mellitus with hyperglycemia: Secondary | ICD-10-CM | POA: Diagnosis not present

## 2015-10-16 DIAGNOSIS — Z09 Encounter for follow-up examination after completed treatment for conditions other than malignant neoplasm: Secondary | ICD-10-CM

## 2015-10-19 NOTE — Congregational Nurse Program (Unsigned)
Congregational Nurse Program Note  Date of Encounter: 10/16/2015  Past Medical History: Past Medical History:  Diagnosis Date  . Aortic atherosclerosis ([place])   . Diabetes mellitus   . Diverticulosis   . Dyspepsia   . Fecal incontinence   . Gastritis   . GERD (gastroesophageal reflux disease)   . Hemorrhoids   . Hiatal hernia   . Hyperlipidemia   . Hypertension   . Hypothyroidism   . IBS (irritable bowel syndrome)   . Osteoporosis   . UTI (lower urinary tract infection)   . Vitamin D deficiency     Encounter Details:     CNP Questionnaire - 10/16/15 0930      Patient Demographics   Is this a new or existing patient? Existing   Patient is considered a/an Immigrant   Race Asian     Patient Assistance   Location of Patient Assistance Not Applicable  Dr's office   Patient's financial/insurance status Medicare   Uninsured Patient No   Patient referred to apply for the following financial assistance Not Applicable   Food insecurities addressed Not Applicable   Transportation assistance Yes   Type of Assistance Volunteer   Assistance securing medications Yes   Type of Assistance Other  ordered optum rx,   Educational health offerings Navigating the healthcare system;Other;Chronic disease  translated, arranged meds     Encounter Details   Primary purpose of visit Navigating the Healthcare System   Was an Emergency Department visit averted? Not Applicable   Does patient have a medical provider? Yes   Patient referred to Not Applicable   Was a mental health screening completed? (GAINS tool) No   Does patient have dental issues? No   Does patient have vision issues? No   Does your patient have an abnormal blood pressure today? No   Since previous encounter, have you referred patient for abnormal blood pressure that resulted in a new diagnosis or medication change? No   Does your patient have an abnormal blood glucose today? No   Since previous encounter, have you  referred patient for abnormal blood glucose that resulted in a new diagnosis or medication change? No   Was there a life-saving intervention made? No      Visit Dr. Maudie Mercury for 3 month visit , evaluated CBC Hgb 9.2. Continue to take Iron, f/u 4 month later. C/O metalic click sound heard   Lt .ear, something rolling sound heard Rt. Ear. MD checked ear, wax removed. Asked to refer ENT Dr, Pt. Wanted wait for a while. On the way home, visit pharmacy to buy vitamins. Remains heavy feeling legs- MD stated will get better if Hgb level goes up. Also pt taking over the count med pyridium tid, MD added prescription to [facility] Rx. Will check meds.

## 2015-11-18 ENCOUNTER — Encounter: Payer: Self-pay | Admitting: *Deleted

## 2015-11-19 ENCOUNTER — Encounter: Payer: Self-pay | Admitting: *Deleted

## 2015-11-19 DIAGNOSIS — M791 Myalgia, unspecified site: Secondary | ICD-10-CM

## 2015-11-25 NOTE — Congregational Nurse Program (Unsigned)
Congregational Nurse Program Note  Date of Encounter: 11/19/2015  Past Medical History: Past Medical History:  Diagnosis Date  . Aortic atherosclerosis ([place])   . Diabetes mellitus   . Diverticulosis   . Dyspepsia   . Fecal incontinence   . Gastritis   . GERD (gastroesophageal reflux disease)   . Hemorrhoids   . Hiatal hernia   . Hyperlipidemia   . Hypertension   . Hypothyroidism   . IBS (irritable bowel syndrome)   . Osteoporosis   . UTI (lower urinary tract infection)   . Vitamin D deficiency     Encounter Details:     CNP Questionnaire - 11/19/15 2030      Patient Demographics   Is this a new or existing patient? Existing   Patient is considered a/an Immigrant   Race Asian     Patient Assistance   Location of Patient Assistance Not Applicable  at church   Patient's financial/insurance status Medicare   Uninsured Patient No   Patient referred to apply for the following financial assistance Not Applicable   Food insecurities addressed Not Applicable   Transportation assistance No   Assistance securing medications Yes   Type of Assistance Other  checking if she taking right drug.   Educational Software engineer;Other;Medications  hot pack and massage on back     Encounter Details   Primary purpose of visit Safety   Was an Emergency Department visit averted? Not Applicable   Does patient have a medical provider? Yes   Patient referred to Not Applicable   Was a mental health screening completed? (GAINS tool) No   Does patient have dental issues? No   Does patient have vision issues? No   Does your patient have an abnormal blood pressure today? No   Since previous encounter, have you referred patient for abnormal blood pressure that resulted in a new diagnosis or medication change? No   Does your patient have an abnormal blood glucose today? No   Since previous encounter, have you referred patient for abnormal blood glucose that resulted in a new  diagnosis or medication change? No   Was there a life-saving intervention made? No      Remains sore on back. Checked medicine for pain, massage, and hot pack recommended.  will f/u on Sunday.

## 2015-11-25 NOTE — Congregational Nurse Program (Unsigned)
Congregational Nurse Program Note  Date of Encounter: 11/18/2015  Past Medical History: Past Medical History:  Diagnosis Date  . Aortic atherosclerosis ([place])   . Diabetes mellitus   . Diverticulosis   . Dyspepsia   . Fecal incontinence   . Gastritis   . GERD (gastroesophageal reflux disease)   . Hemorrhoids   . Hiatal hernia   . Hyperlipidemia   . Hypertension   . Hypothyroidism   . IBS (irritable bowel syndrome)   . Osteoporosis   . UTI (lower urinary tract infection)   . Vitamin D deficiency     Encounter Details:     CNP Questionnaire - 11/18/15 1800      Patient Demographics   Is this a new or existing patient? Existing   Patient is considered a/an Immigrant   Race Asian     Patient Assistance   Location of Patient Assistance Not Applicable  home visit   Patient's financial/insurance status Medicare   Uninsured Patient No   Patient referred to apply for the following financial assistance Not Applicable   Food insecurities addressed Not Applicable   Transportation assistance No   Assistance securing medications No   Type of Assistance Other  home visit   Educational health offerings Exercise/physical activity;Safety     Encounter Details   Primary purpose of visit Safety   Was an Emergency Department visit averted? Not Applicable   Does patient have a medical provider? Yes   Patient referred to Not Applicable   Was a mental health screening completed? (GAINS tool) No   Does patient have dental issues? No   Does patient have vision issues? No   Does your patient have an abnormal blood pressure today? No   Since previous encounter, have you referred patient for abnormal blood pressure that resulted in a new diagnosis or medication change? No   Does your patient have an abnormal blood glucose today? No   Since previous encounter, have you referred patient for abnormal blood glucose that resulted in a new diagnosis or medication change? No   Was there a  life-saving intervention made? No      C/o back pain  Since last weekend from church.pt said taking tylenol . Stopped by check on her, brought food for her.

## 2015-12-10 ENCOUNTER — Encounter: Payer: Self-pay | Admitting: *Deleted

## 2015-12-10 DIAGNOSIS — Z139 Encounter for screening, unspecified: Secondary | ICD-10-CM

## 2015-12-10 LAB — GLUCOSE, POCT (MANUAL RESULT ENTRY): POC GLUCOSE: 118 mg/dL — AB (ref 70–99)

## 2015-12-11 NOTE — Congregational Nurse Program (Unsigned)
Congregational Nurse Program Note  Date of Encounter: 12/10/2015  Past Medical History: Past Medical History:  Diagnosis Date  . Aortic atherosclerosis ([place])   . Diabetes mellitus   . Diverticulosis   . Dyspepsia   . Fecal incontinence   . Gastritis   . GERD (gastroesophageal reflux disease)   . Hemorrhoids   . Hiatal hernia   . Hyperlipidemia   . Hypertension   . Hypothyroidism   . IBS (irritable bowel syndrome)   . Osteoporosis   . UTI (lower urinary tract infection)   . Vitamin D deficiency     Encounter Details:     CNP Questionnaire - 12/10/15 2040      Patient Demographics   Is this a new or existing patient? Existing   Patient is considered a/an Immigrant   Race Asian     Patient Assistance   Location of Patient Assistance Not Applicable  [facility]   Patient's financial/insurance status Medicare   Uninsured Patient No   Patient referred to apply for the following financial assistance Not Applicable   Food insecurities addressed Not Applicable   Transportation assistance No   Assistance securing medications Yes   Type of Assistance Other   Educational health offerings Spiritual care;Medications     Encounter Details   Primary purpose of visit Chronic Illness/Condition Visit;Other  medicine ordered by online   Was an Emergency Department visit averted? Not Applicable   Does patient have a medical provider? Yes   Patient referred to Not Applicable   Was a mental health screening completed? (GAINS tool) No   Does patient have dental issues? No   Does patient have vision issues? No   Does your patient have an abnormal blood pressure today? No   Since previous encounter, have you referred patient for abnormal blood pressure that resulted in a new diagnosis or medication change? No   Does your patient have an abnormal blood glucose today? Yes  [phone]   Since previous encounter, have you referred patient for abnormal blood glucose that resulted in a new diagnosis  or medication change? No   Was there a life-saving intervention made? No     run out glucose strip, checked CBG, 118. No energy, need more meds. Ordered by [employer]

## 2015-12-14 ENCOUNTER — Encounter: Payer: Self-pay | Admitting: *Deleted

## 2015-12-14 DIAGNOSIS — Z760 Encounter for issue of repeat prescription: Secondary | ICD-10-CM

## 2015-12-14 DIAGNOSIS — M791 Myalgia, unspecified site: Secondary | ICD-10-CM

## 2015-12-19 NOTE — Congregational Nurse Program (Unsigned)
Congregational Nurse Program Note  Date of Encounter: 12/14/2015  Past Medical History: Past Medical History:  Diagnosis Date  . Aortic atherosclerosis ([place])   . Diabetes mellitus   . Diverticulosis   . Dyspepsia   . Fecal incontinence   . Gastritis   . GERD (gastroesophageal reflux disease)   . Hemorrhoids   . Hiatal hernia   . Hyperlipidemia   . Hypertension   . Hypothyroidism   . IBS (irritable bowel syndrome)   . Osteoporosis   . UTI (lower urinary tract infection)   . Vitamin D deficiency     Encounter Details:     CNP Questionnaire - 12/14/15 1330      Patient Demographics   Is this a new or existing patient? Existing   Patient is considered a/an Immigrant   Race Asian     Patient Assistance   Location of Patient Assistance Not Applicable   Patient's financial/insurance status Medicare   Uninsured Patient No   Patient referred to apply for the following financial assistance Not Applicable   Food insecurities addressed Not Applicable   Transportation assistance No   Assistance securing medications Yes  optum RX, refill orders done   Type of Assistance Other  meds ordered, check cbg   Educational health offerings Exercise/physical activity     Encounter Details   Primary purpose of visit Chronic Illness/Condition Visit;Spiritual Care/Support Visit   Was an Emergency Department visit averted? Not Applicable   Does patient have a medical provider? Yes   Patient referred to Not Applicable   Was a mental health screening completed? (GAINS tool) No   Does patient have dental issues? No   Does patient have vision issues? No   Does your patient have an abnormal blood pressure today? No   Since previous encounter, have you referred patient for abnormal blood pressure that resulted in a new diagnosis or medication change? No   Does your patient have an abnormal blood glucose today? Yes  115   Since previous encounter, have you referred patient for abnormal blood  glucose that resulted in a new diagnosis or medication change? No   Was there a life-saving intervention made? No     CBG checked, 115  Unable to order because could not find screening .

## 2015-12-21 ENCOUNTER — Encounter: Payer: Self-pay | Admitting: *Deleted

## 2015-12-21 DIAGNOSIS — M5432 Sciatica, left side: Secondary | ICD-10-CM

## 2015-12-21 NOTE — Congregational Nurse Program (Signed)
Congregational Nurse Program Note  Date of Encounter: 12/21/2015  Past Medical History: Past Medical History:  Diagnosis Date  . Aortic atherosclerosis ([place])   . Diabetes mellitus   . Diverticulosis   . Dyspepsia   . Fecal incontinence   . Gastritis   . GERD (gastroesophageal reflux disease)   . Hemorrhoids   . Hiatal hernia   . Hyperlipidemia   . Hypertension   . Hypothyroidism   . IBS (irritable bowel syndrome)   . Osteoporosis   . UTI (lower urinary tract infection)   . Vitamin D deficiency     Encounter Details:     CNP Questionnaire - 12/21/15 2159      Patient Demographics   Is this a new or existing patient? Existing   Patient is considered a/an Immigrant   Race Asian     Patient Assistance   Location of Patient Assistance Not Applicable   Patient's financial/insurance status Medicare   Uninsured Patient No   Patient referred to apply for the following financial assistance Not Applicable   Food insecurities addressed Not Applicable   Transportation assistance No   Assistance securing medications No   Type of Assistance Other  na   Educational health offerings Exercise/physical activity  [place] pain Lt side      Encounter Details   Primary purpose of visit Education/Health Concerns;Safety   Was an Emergency Department visit averted? Not Applicable   Does patient have a medical provider? Yes   Patient referred to Not Applicable   Was a mental health screening completed? (GAINS tool) No   Does patient have dental issues? No   Does patient have vision issues? No   Does your patient have an abnormal blood pressure today? No   Since previous encounter, have you referred patient for abnormal blood pressure that resulted in a new diagnosis or medication change? No   Does your patient have an abnormal blood glucose today? No   Since previous encounter, have you referred patient for abnormal blood glucose that resulted in a new diagnosis or medication change?  No   Was there a life-saving intervention made? No     limping Lt side, on going chronic sciatica pain Lt side Showing exercise video and teaching, pt said the picture still have it by Dr.Pang and doing exercise. Will check one week later then decide to see MD.

## 2015-12-23 ENCOUNTER — Encounter: Payer: Self-pay | Admitting: *Deleted

## 2015-12-30 LAB — GLUCOSE, POCT (MANUAL RESULT ENTRY): POC GLUCOSE: 210 mg/dL — AB (ref 70–99)

## 2016-01-09 LAB — GLUCOSE, POCT (MANUAL RESULT ENTRY): POC Glucose: 153 mg/dl — AB (ref 70–99)

## 2016-01-11 ENCOUNTER — Encounter: Payer: Self-pay | Admitting: *Deleted

## 2016-01-11 DIAGNOSIS — Z131 Encounter for screening for diabetes mellitus: Secondary | ICD-10-CM

## 2016-01-18 ENCOUNTER — Encounter: Payer: Self-pay | Admitting: *Deleted

## 2016-01-18 DIAGNOSIS — Z139 Encounter for screening, unspecified: Secondary | ICD-10-CM

## 2016-01-18 NOTE — Congregational Nurse Program (Unsigned)
Congregational Nurse Program Note  Date of Encounter: 12/30/2015 Past Medical History: Past Medical History:  Diagnosis Date  . Aortic atherosclerosis ([place])   . Diabetes mellitus   . Diverticulosis   . Dyspepsia   . Fecal incontinence   . Gastritis   . GERD (gastroesophageal reflux disease)   . Hemorrhoids   . Hiatal hernia   . Hyperlipidemia   . Hypertension   . Hypothyroidism   . IBS (irritable bowel syndrome)   . Osteoporosis   . UTI (lower urinary tract infection)   . Vitamin D deficiency     Encounter Details:   C/o her glucometer was not working. chec cbg

## 2016-01-18 NOTE — Congregational Nurse Program (Unsigned)
Congregational Nurse Program Note  Date of Encounter: 01/14/2016 Past Medical History: Past Medical History:  Diagnosis Date  . Aortic atherosclerosis ([place])   . Diabetes mellitus   . Diverticulosis   . Dyspepsia   . Fecal incontinence   . Gastritis   . GERD (gastroesophageal reflux disease)   . Hemorrhoids   . Hiatal hernia   . Hyperlipidemia   . Hypertension   . Hypothyroidism   . IBS (irritable bowel syndrome)   . Osteoporosis   . UTI (lower urinary tract infection)   . Vitamin D deficiency     Encounter Details:   Stopped by office due to BP meds almost empty. Requested med via online . Will be arrive 2-3 days .

## 2016-02-16 ENCOUNTER — Telehealth: Payer: Self-pay | Admitting: *Deleted

## 2016-02-16 ENCOUNTER — Encounter: Payer: Self-pay | Admitting: *Deleted

## 2016-02-16 DIAGNOSIS — R300 Dysuria: Secondary | ICD-10-CM | POA: Diagnosis not present

## 2016-02-16 DIAGNOSIS — D649 Anemia, unspecified: Secondary | ICD-10-CM | POA: Diagnosis not present

## 2016-02-16 DIAGNOSIS — E1165 Type 2 diabetes mellitus with hyperglycemia: Secondary | ICD-10-CM | POA: Diagnosis not present

## 2016-02-16 DIAGNOSIS — E039 Hypothyroidism, unspecified: Secondary | ICD-10-CM | POA: Diagnosis not present

## 2016-02-16 NOTE — Congregational Nurse Program (Unsigned)
Congregational Nurse Program Note  Date of Encounter: 02/15/2016 Past Medical History: Past Medical History:  Diagnosis Date  . Aortic atherosclerosis ([place])   . Diabetes mellitus   . Diverticulosis   . Dyspepsia   . Fecal incontinence   . Gastritis   . GERD (gastroesophageal reflux disease)   . Hemorrhoids   . Hiatal hernia   . Hyperlipidemia   . Hypertension   . Hypothyroidism   . IBS (irritable bowel syndrome)   . Osteoporosis   . UTI (lower urinary tract infection)   . Vitamin D deficiency     Encounter Details:     CNP Questionnaire - 02/15/16 1300      Patient Demographics   Is this a new or existing patient? Existing   Patient is considered a/an Immigrant   Race Asian     Patient Assistance   Location of Patient Assistance Not Applicable  [facility]   Patient's financial/insurance status Medicare   Uninsured Patient ([employer] [employer]) No   Patient referred to apply for the following financial assistance Not Applicable   Food insecurities addressed Not Applicable   Transportation assistance No   Assistance securing medications Yes   Type of Assistance Other  mail, bill check up   Educational health offerings Not Applicable     Encounter Details   Primary purpose of visit Other   Was an Emergency Department visit averted? Not Applicable   Does patient have a medical provider? No   Patient referred to Not Applicable   Was a mental health screening completed? (GAINS tool) No   Does patient have dental issues? No   Does patient have vision issues? No   Does your patient have an abnormal blood pressure today? No   Since previous encounter, have you referred patient for abnormal blood pressure that resulted in a new diagnosis or medication change? No   Does your patient have an abnormal blood glucose today? No   Since previous encounter, have you referred patient for abnormal blood glucose that resulted in a new diagnosis or medication change? No   Was  there a life-saving intervention made? No     mail order medicine arrived yesterday. Will meet on Monday due to Lab @ Dr. Julianne Rice office. Also check up mails   bills.

## 2016-02-16 NOTE — Congregational Nurse Program (Unsigned)
Congregational Nurse Program Note  Date of Encounter: 01/28/2016 Past Medical History: Past Medical History:  Diagnosis Date  . Aortic atherosclerosis ([place])   . Diabetes mellitus   . Diverticulosis   . Dyspepsia   . Fecal incontinence   . Gastritis   . GERD (gastroesophageal reflux disease)   . Hemorrhoids   . Hiatal hernia   . Hyperlipidemia   . Hypertension   . Hypothyroidism   . IBS (irritable bowel syndrome)   . Osteoporosis   . UTI (lower urinary tract infection)   . Vitamin D deficiency     Encounter Details:     CNP Questionnaire - 01/28/16 1530      Patient Demographics   Is this a new or existing patient? Existing   Patient is considered a/an Immigrant   Race Asian     Patient Assistance   Location of Patient Assistance Not Applicable   Patient's financial/insurance status Medicare   Uninsured Patient ([facility] [employer]) No   Patient referred to apply for the following financial assistance Not Applicable   Food insecurities addressed Not Applicable   Transportation assistance No   Educational health offerings Navigating the healthcare system  dental office     Encounter Details   Primary purpose of visit Other  translate   Was an Emergency Department visit averted? Not Applicable   Does patient have a medical provider? Yes   Patient referred to Not Applicable   Was a mental health screening completed? (GAINS tool) No   Does patient have dental issues? No  yearly check up   Does patient have vision issues? No   Does your patient have an abnormal blood pressure today? No   Since previous encounter, have you referred patient for abnormal blood pressure that resulted in a new diagnosis or medication change? No   Does your patient have an abnormal blood glucose today? No   Since previous encounter, have you referred patient for abnormal blood glucose that resulted in a new diagnosis or medication change? No   Was there a life-saving intervention  made? No     yearly dental check up. Translated . Spent 2hours at the clinic

## 2016-02-16 NOTE — Congregational Nurse Program (Unsigned)
Congregational Nurse Program Note  Date of Encounter: 02/13/2016 Past Medical History: Past Medical History:  Diagnosis Date  . Aortic atherosclerosis ([place])   . Diabetes mellitus   . Diverticulosis   . Dyspepsia   . Fecal incontinence   . Gastritis   . GERD (gastroesophageal reflux disease)   . Hemorrhoids   . Hiatal hernia   . Hyperlipidemia   . Hypertension   . Hypothyroidism   . IBS (irritable bowel syndrome)   . Osteoporosis   . UTI (lower urinary tract infection)   . Vitamin D deficiency     Encounter Details:     CNP Questionnaire - 02/13/16 1500      Patient Demographics   Is this a new or existing patient? Existing   Patient is considered a/an Immigrant   Race Asian     Patient Assistance   Location of Patient Assistance Not Applicable  [facility]   Patient's financial/insurance status Medicare   Uninsured Patient ([facility] [employer]) No   Patient referred to apply for the following financial assistance Not Applicable   Food insecurities addressed Not Applicable   Transportation assistance No   Assistance securing medications Yes   Type of Assistance Other  bought some pyridium due to run out meds.   Educational health offerings Chronic disease     Encounter Details   Primary purpose of visit Other  medicine   Was an Emergency Department visit averted? Not Applicable   Does patient have a medical provider? Yes   Patient referred to Not Applicable   Was a mental health screening completed? (GAINS tool) No   Does patient have dental issues? No   Does patient have vision issues? No   Does your patient have an abnormal blood pressure today? No   Since previous encounter, have you referred patient for abnormal blood pressure that resulted in a new diagnosis or medication change? No   Does your patient have an abnormal blood glucose today? No   Since previous encounter, have you referred patient for abnormal blood glucose that resulted in a new diagnosis  or medication change? No   Was there a life-saving intervention made? No     run out pyridium, bought some meds and await mail order come in.

## 2016-02-23 ENCOUNTER — Encounter: Payer: Self-pay | Admitting: *Deleted

## 2016-02-23 DIAGNOSIS — H9319 Tinnitus, unspecified ear: Secondary | ICD-10-CM | POA: Insufficient documentation

## 2016-02-23 NOTE — Congregational Nurse Program (Signed)
Congregational Nurse Program Note  Date of Encounter: 02/22/2016 Past Medical History: Past Medical History:  Diagnosis Date  . Aortic atherosclerosis ([place])   . Diabetes mellitus   . Diverticulosis   . Dyspepsia   . Fecal incontinence   . Gastritis   . GERD (gastroesophageal reflux disease)   . Hemorrhoids   . Hiatal hernia   . Hyperlipidemia   . Hypertension   . Hypothyroidism   . IBS (irritable bowel syndrome)   . Osteoporosis   . UTI (lower urinary tract infection)   . Vitamin D deficiency     Encounter Details:     CNP Questionnaire - 02/22/16 1330      Patient Demographics   Is this a new or existing patient? Existing   Patient is considered a/an Immigrant   Race Asian     Patient Assistance   Location of Patient Assistance Not Applicable  [facility]   Patient's financial/insurance status Medicare   Uninsured Patient ([facility] Oncologist) No   Patient referred to apply for the following financial assistance Not Applicable   Food insecurities addressed Not Applicable   Transportation assistance No   Assistance securing medications No   Type of Nurse, adult;Other     Encounter Details   Primary purpose of visit Navigating the Healthcare System   Was an Emergency Department visit averted? Not Applicable   Does patient have a medical provider? Yes   Patient referred to Clinic;Doctor referral for a non-emergent behavioral health crisis  ENT and Eye Dr. appointment will be made   Was a mental health screening completed? (GAINS tool) No   Does patient have dental issues? No   Does patient have vision issues? Yes  c/o vision decreased, need regular check up   Was a vision referral made? No  will make an appointment    Does your patient have an abnormal blood pressure today? No   Since previous encounter, have you referred patient for abnormal blood pressure that resulted in a new diagnosis or medication change? No   Does your patient have an abnormal blood glucose today? No   Since previous encounter, have you referred patient for abnormal blood glucose that resulted in a new diagnosis or medication change? No   Was there a life-saving intervention made? No     pt wanted to check eye Dr. To decreased vision  And c/o metallic click sound also hear sound like blood flow. Will refer to ENT Dr.

## 2016-02-24 DIAGNOSIS — H9319 Tinnitus, unspecified ear: Secondary | ICD-10-CM | POA: Diagnosis not present

## 2016-02-24 DIAGNOSIS — E1165 Type 2 diabetes mellitus with hyperglycemia: Secondary | ICD-10-CM | POA: Diagnosis not present

## 2016-02-24 DIAGNOSIS — D649 Anemia, unspecified: Secondary | ICD-10-CM | POA: Diagnosis not present

## 2016-02-25 ENCOUNTER — Encounter: Payer: Self-pay | Admitting: *Deleted

## 2016-02-25 NOTE — Congregational Nurse Program (Signed)
Congregational Nurse Program Note  Date of Encounter: 02/24/2016 Past Medical History: Past Medical History:  Diagnosis Date  . Aortic atherosclerosis ([place])   . Diabetes mellitus   . Diverticulosis   . Dyspepsia   . Fecal incontinence   . Gastritis   . GERD (gastroesophageal reflux disease)   . Hemorrhoids   . Hiatal hernia   . Hyperlipidemia   . Hypertension   . Hypothyroidism   . IBS (irritable bowel syndrome)   . Osteoporosis   . UTI (lower urinary tract infection)   . Vitamin D deficiency     Encounter Details:     CNP Questionnaire - 02/24/16 1300      Patient Demographics   Is this a new or existing patient? Existing   Patient is considered a/an Immigrant   Race Asian     Patient Assistance   Location of Patient Assistance Not Applicable   Patient's financial/insurance status Medicare   Uninsured Patient ([facility] Oncologist) No   Patient referred to apply for the following financial assistance Not Applicable   Food insecurities addressed Not Applicable   Transportation assistance No   Assistance securing medications No   Type of Assistance Other  clinic visit   Educational health offerings Not Applicable     Encounter Details   Primary purpose of visit [place]   Was an Emergency Department visit averted? Not Applicable   Does patient have a medical provider? Yes   Patient referred to Clinic   Was a mental health screening completed? (GAINS tool) No   Does patient have dental issues? No   Does patient have vision issues? Yes   Was a vision referral made? Yes   Does your patient have an abnormal blood pressure today? No   Since previous encounter, have you referred patient for abnormal blood pressure that resulted in a new diagnosis or medication change? No   Does your patient have an abnormal blood glucose today? No   Since previous encounter, have you referred patient for abnormal blood glucose that resulted in a new  diagnosis or medication change? No     hgb down 9.3->8.9 will check 4 month later. Lasix prn order for swelling legs, Dr Benjamine Mola appoint made on January for hearing check up.

## 2016-03-21 NOTE — Congregational Nurse Program (Signed)
Pt requested  Medicine refill order via online. [facility] contacted and ordered meds,

## 2016-03-23 ENCOUNTER — Telehealth: Payer: Self-pay | Admitting: *Deleted

## 2016-03-23 ENCOUNTER — Encounter: Payer: Self-pay | Admitting: *Deleted

## 2016-03-23 DIAGNOSIS — H9313 Tinnitus, bilateral: Secondary | ICD-10-CM | POA: Diagnosis not present

## 2016-03-23 DIAGNOSIS — H903 Sensorineural hearing loss, bilateral: Secondary | ICD-10-CM | POA: Diagnosis not present

## 2016-03-23 DIAGNOSIS — H608X3 Other otitis externa, bilateral: Secondary | ICD-10-CM | POA: Diagnosis not present

## 2016-03-23 NOTE — Congregational Nurse Program (Unsigned)
Congregational Nurse Program Note  Date of Encounter: 03/23/2016  Past Medical History: Past Medical History:  Diagnosis Date  . Aortic atherosclerosis ([place])   . Diabetes mellitus   . Diverticulosis   . Dyspepsia   . Fecal incontinence   . Gastritis   . GERD (gastroesophageal reflux disease)   . Hemorrhoids   . Hiatal hernia   . Hyperlipidemia   . Hypertension   . Hypothyroidism   . IBS (irritable bowel syndrome)   . Osteoporosis   . UTI (lower urinary tract infection)   . Vitamin D deficiency     Encounter Details:     CNP Questionnaire - 03/23/16 1030      Patient Demographics   Is this a new or existing patient? Existing   Patient is considered a/an Immigrant   Race Asian     Patient Assistance   Location of Patient Assistance Not Applicable   Patient's financial/insurance status Medicare   Uninsured Patient ([facility] Oncologist) No   Patient referred to apply for the following financial assistance Not Applicable   Food insecurities addressed Not Applicable   Transportation assistance Yes   Type of Assistance Volunteer   Assistance securing medications Yes  [employer], pick med up   Type of Assistance Other  pick up meds   Educational health offerings Navigating the healthcare system     Encounter Details   Primary purpose of visit Spiritual Care/Support Visit;Navigating the Healthcare System   Was an Emergency Department visit averted? Not Applicable   Does patient have a medical provider? Yes   Patient referred to Clinic  Dr Lenny Pastel. screen hearing loss   Was a mental health screening completed? (GAINS tool) No   Does patient have dental issues? No   Does patient have vision issues? No   Does your patient have an abnormal blood pressure today? No  NA   Since previous encounter, have you referred patient for abnormal blood pressure that resulted in a new diagnosis or medication change? No   Does your patient have an abnormal blood glucose today? No   NA   Since previous encounter, have you referred patient for abnormal blood glucose that resulted in a new diagnosis or medication change? No   Was there a life-saving intervention made? No    high pitch frequency hearing loss, ear cannel eczema, steroids cream for eczema Qd PRN order received. Otherwise normal test. Made an appointment for next year Text message sent pt's daughter.

## 2016-03-24 DIAGNOSIS — H9110 Presbycusis, unspecified ear: Secondary | ICD-10-CM | POA: Insufficient documentation

## 2016-04-13 DIAGNOSIS — H2513 Age-related nuclear cataract, bilateral: Secondary | ICD-10-CM | POA: Diagnosis not present

## 2016-04-13 DIAGNOSIS — H353130 Nonexudative age-related macular degeneration, bilateral, stage unspecified: Secondary | ICD-10-CM | POA: Diagnosis not present

## 2016-04-13 DIAGNOSIS — H40013 Open angle with borderline findings, low risk, bilateral: Secondary | ICD-10-CM | POA: Diagnosis not present

## 2016-04-13 DIAGNOSIS — E119 Type 2 diabetes mellitus without complications: Secondary | ICD-10-CM | POA: Diagnosis not present

## 2016-04-14 ENCOUNTER — Encounter: Payer: Self-pay | Admitting: *Deleted

## 2016-04-14 NOTE — Congregational Nurse Program (Unsigned)
Congregational Nurse Program Note  Date of Encounter: 04/01/2016 Past Medical History: Past Medical History:  Diagnosis Date  . Aortic atherosclerosis ([place])   . Diabetes mellitus   . Diverticulosis   . Dyspepsia   . Fecal incontinence   . Gastritis   . GERD (gastroesophageal reflux disease)   . Hemorrhoids   . Hiatal hernia   . Hyperlipidemia   . Hypertension   . Hypothyroidism   . IBS (irritable bowel syndrome)   . Osteoporosis   . UTI (lower urinary tract infection)   . Vitamin D deficiency     Encounter Details:     CNP Questionnaire - 04/01/16 1000      Patient Demographics   Is this a new or existing patient? Existing   Patient is considered a/an Immigrant   Race Asian     Patient Assistance   Location of Patient Assistance Not Applicable   Patient's financial/insurance status Medicare   Uninsured Patient ([facility] Oncologist) No   Patient referred to apply for the following financial assistance Not Applicable   Food insecurities addressed Not Applicable   Transportation assistance No   Assistance securing medications No   Educational health offerings Not Applicable     Encounter Details   Primary purpose of visit Family/Caregiver Support;Other  eye Dr   Was an Emergency Department visit averted? Not Applicable   Does patient have a medical provider? Yes   Patient referred to Not Applicable   Was a mental health screening completed? (GAINS tool) No   Does patient have dental issues? No   Does patient have vision issues? Yes  vision decreased   Was a vision referral made? Yes  Dr. Herbert Deaner   Does your patient have an abnormal blood pressure today? No   Since previous encounter, have you referred patient for abnormal blood pressure that resulted in a new diagnosis or medication change? No   Does your patient have an abnormal blood glucose today? No   Since previous encounter, have you referred patient for abnormal blood glucose that resulted in a new  diagnosis or medication change? No   Was there a life-saving intervention made? No     Daughter visit pt from [place], requested eye dr information. She will make an appointment to check up vision.

## 2016-04-14 NOTE — Congregational Nurse Program (Unsigned)
Congregational Nurse Program Note  Date of Encounter: 04/08/2016 Past Medical History: Past Medical History:  Diagnosis Date  . Aortic atherosclerosis ([place])   . Diabetes mellitus   . Diverticulosis   . Dyspepsia   . Fecal incontinence   . Gastritis   . GERD (gastroesophageal reflux disease)   . Hemorrhoids   . Hiatal hernia   . Hyperlipidemia   . Hypertension   . Hypothyroidism   . IBS (irritable bowel syndrome)   . Osteoporosis   . UTI (lower urinary tract infection)   . Vitamin D deficiency     Encounter Details:     CNP Questionnaire - 04/08/16 1700      Patient Demographics   Is this a new or existing patient? Existing   Patient is considered a/an Immigrant   Race Asian     Patient Assistance   Location of Patient Assistance Not Applicable   Patient's financial/insurance status Medicare   Uninsured Patient ([facility] Oncologist) No   Patient referred to apply for the following financial assistance Not Applicable   Food insecurities addressed Not Applicable   Transportation assistance No  home visit   Assistance securing medications Yes  checked receved medication from pharmacy   Type of Assistance Other  checked medication received from pharmacy from [facility]   Educational health offerings Medications     Encounter Details   Primary purpose of visit Spiritual Care/Support Visit;Family/Caregiver Support   Was an Emergency Department visit averted? Not Applicable   Does patient have a medical provider? Yes   Patient referred to Not Applicable   Was a mental health screening completed? (GAINS tool) No   Does patient have dental issues? No   Does patient have vision issues? Yes  appointment  with dr hecker @ 04/13/16    Was a vision referral made? No   Does your patient have an abnormal blood pressure today? No   Since previous encounter, have you referred patient for abnormal blood pressure that resulted in a new diagnosis or medication change? No   Does your patient have an abnormal blood glucose today? No   Since previous encounter, have you referred patient for abnormal blood glucose that resulted in a new diagnosis or medication change? No   Was there a life-saving intervention made? No     home visit. Checked medication that received from pharmacy. Filled up Hx papers for eye Dr. Harlow Mares. Daughter will take pt to eye Dr's appointment.

## 2016-04-21 ENCOUNTER — Telehealth: Payer: Self-pay | Admitting: *Deleted

## 2016-05-15 ENCOUNTER — Encounter: Payer: Self-pay | Admitting: *Deleted

## 2016-05-15 NOTE — Congregational Nurse Program (Unsigned)
Congregational Nurse Program Note  Date of Encounter: 05/12/2016  Past Medical History: Past Medical History:  Diagnosis Date  . Aortic atherosclerosis ([place])   . Diabetes mellitus   . Diverticulosis   . Dyspepsia   . Fecal incontinence   . Gastritis   . GERD (gastroesophageal reflux disease)   . Hemorrhoids   . Hiatal hernia   . Hyperlipidemia   . Hypertension   . Hypothyroidism   . IBS (irritable bowel syndrome)   . Osteoporosis   . UTI (lower urinary tract infection)   . Vitamin D deficiency     Encounter Details:     CNP Questionnaire - 05/12/16 1930      Patient Demographics   Is this a new or existing patient? Existing   Patient is considered a/an Immigrant   Race Asian     Patient Assistance   Location of Patient Assistance Not Applicable   Patient's financial/insurance status Medicare   Uninsured Patient ([facility] Oncologist) No   Patient referred to apply for the following financial assistance Not Applicable   Food insecurities addressed Not Applicable   Transportation assistance No   Assistance securing medications No   Educational Software engineer     Encounter Details   Primary purpose of visit [place]   Was an Emergency Department visit averted? Not Applicable   Does patient have a medical provider? Yes   Patient referred to Not Applicable   Was a mental health screening completed? (GAINS tool) No   Does patient have dental issues? No   Does patient have vision issues? No   Does your patient have an abnormal blood pressure today? No   Since previous encounter, have you referred patient for abnormal blood pressure that resulted in a new diagnosis or medication change? No   Does your patient have an abnormal blood glucose today? Yes  CBG 136   Since previous encounter, have you referred patient for abnormal blood glucose that resulted in a new diagnosis or medication change? No   Was there a life-saving intervention made? No     c/o  fell landed on Rt side on the floor when turn around, because slippery road, no abrasions noted,  Did not hurt  That time, after then wrist discomfort when moves. No swelling noted. Told her ice pack. She was using wrist band for support. Will f/u.

## 2016-05-18 ENCOUNTER — Encounter: Payer: Self-pay | Admitting: *Deleted

## 2016-05-18 DIAGNOSIS — K30 Functional dyspepsia: Secondary | ICD-10-CM | POA: Insufficient documentation

## 2016-05-18 NOTE — Congregational Nurse Program (Signed)
Congregational Nurse Program Note  Date of Encounter: 05/16/2016 Past Medical History: Past Medical History:  Diagnosis Date  . Aortic atherosclerosis ([place])   . Diabetes mellitus   . Diverticulosis   . Dyspepsia   . Fecal incontinence   . Gastritis   . GERD (gastroesophageal reflux disease)   . Hemorrhoids   . Hiatal hernia   . Hyperlipidemia   . Hypertension   . Hypothyroidism   . IBS (irritable bowel syndrome)   . Osteoporosis   . UTI (lower urinary tract infection)   . Vitamin D deficiency     Encounter Details:     CNP Questionnaire - 05/16/16 1300      Patient Demographics   Is this a new or existing patient? Existing   Patient is considered a/an Immigrant   Race Asian     Patient Assistance   Location of Patient Assistance Not Applicable  [facility]   Patient's financial/insurance status Medicare   Uninsured Patient ([facility] Oncologist) No   Patient referred to apply for the following financial assistance Not Applicable   Food insecurities addressed Not Applicable   Transportation assistance No   Assistance securing medications No   Educational health offerings Not Applicable     Encounter Details   Primary purpose of visit Education/Health Concerns   Was an Emergency Department visit averted? Not Applicable   Does patient have a medical provider? Yes   Patient referred to Not Applicable   Was a mental health screening completed? (GAINS tool) No   Does patient have dental issues? No   Does patient have vision issues? No   Does your patient have an abnormal blood pressure today? No   Since previous encounter, have you referred patient for abnormal blood pressure that resulted in a new diagnosis or medication change? No   Does your patient have an abnormal blood glucose today? No   Since previous encounter, have you referred patient for abnormal blood glucose that resulted in a new diagnosis or medication change? No   Was there a life-saving intervention  made? No     c/o stomach upset No c/o chest pain or other sx except  Stomach discomfort. Pt stated her CBG was 130 this am.  Advised to take Tums 100mg  chewing, pt took it. Felt better after 47min.

## 2016-05-22 ENCOUNTER — Encounter: Payer: Self-pay | Admitting: *Deleted

## 2016-05-22 NOTE — Congregational Nurse Program (Unsigned)
Congregational Nurse Program Note  Date of Encounter: 05/21/2016 Past Medical History: Past Medical History:  Diagnosis Date  . Aortic atherosclerosis ([place])   . Diabetes mellitus   . Diverticulosis   . Dyspepsia   . Fecal incontinence   . Gastritis   . GERD (gastroesophageal reflux disease)   . Hemorrhoids   . Hiatal hernia   . Hyperlipidemia   . Hypertension   . Hypothyroidism   . IBS (irritable bowel syndrome)   . Osteoporosis   . UTI (lower urinary tract infection)   . Vitamin D deficiency     Encounter Details:     CNP Questionnaire - 05/21/16 0830      Patient Demographics   Is this a new or existing patient? Existing   Patient is considered a/an Immigrant   Race Asian     Patient Assistance   Location of Patient Assistance Not Applicable   Patient's financial/insurance status Medicare   Uninsured Patient ([facility] Oncologist) No   Patient referred to apply for the following financial assistance Not Applicable   Food insecurities addressed Not Applicable   Transportation assistance No   Assistance securing medications No   Type of Assistance Other   Educational health offerings Other  check meds list     Encounter Details   Primary purpose of visit Other  stomach pain   Was an Emergency Department visit averted? Not Applicable   Does patient have a medical provider? Yes   Patient referred to Other;Urgent Care   Was a mental health screening completed? (GAINS tool) No   Does patient have dental issues? No   Does patient have vision issues? No   Does your patient have an abnormal blood pressure today? No   Since previous encounter, have you referred patient for abnormal blood pressure that resulted in a new diagnosis or medication change? No   Does your patient have an abnormal blood glucose today? No  CBG 120 per pt   Since previous encounter, have you referred patient for abnormal blood glucose that resulted in a new diagnosis or medication  change? No   Was there a life-saving intervention made? No     stomach cramping on & off,  Started 30 min. Ago.  checked if she was taking prevalcid, she did not take recently. Advised to take now then will f/u in the morning, if problem consist, will vs urgent care .

## 2016-05-27 ENCOUNTER — Encounter: Payer: Self-pay | Admitting: *Deleted

## 2016-05-27 NOTE — Congregational Nurse Program (Unsigned)
Congregational Nurse Program Note  Date of Encounter: 05/26/2016 Past Medical History: Past Medical History:  Diagnosis Date  . Aortic atherosclerosis ([place])   . Diabetes mellitus   . Diverticulosis   . Dyspepsia   . Fecal incontinence   . Gastritis   . GERD (gastroesophageal reflux disease)   . Hemorrhoids   . Hiatal hernia   . Hyperlipidemia   . Hypertension   . Hypothyroidism   . IBS (irritable bowel syndrome)   . Osteoporosis   . UTI (lower urinary tract infection)   . Vitamin D deficiency     Encounter Details:     CNP Questionnaire - 05/26/16 2040      Patient Demographics   Is this a new or existing patient? Existing   Patient is considered a/an Immigrant   Race Asian     Patient Assistance   Location of Patient Assistance Not Applicable  [facility]   Patient's financial/insurance status Medicare   Uninsured Patient ([facility] Oncologist) No   Patient referred to apply for the following financial assistance Not Applicable   Food insecurities addressed Not Applicable   Transportation assistance No   Assistance securing medications Yes  ordered medicines by [facility]   Type of Assistance Other  medicines ordered via [facility], questiond Pyridium order by Pharmacy, would not cover by insurance, will cost over $700. 00   Educational health offerings Not Applicable;Safety;Other;Medications     Encounter Details   Primary purpose of visit Chronic Illness/Condition Visit  stomach pain resolved, Rt wrist pain  after fall   Was an Emergency Department visit averted? No   Does patient have a medical provider? Yes   Patient referred to Other  pt stated no swelling or redness, just pain when she moves, Rt wrist    Was a mental health screening completed? (GAINS tool) No   Does patient have dental issues? No   Does patient have vision issues? No   Does your patient have an abnormal blood pressure today? No   Since previous encounter, have you referred patient for  abnormal blood pressure that resulted in a new diagnosis or medication change? No   Does your patient have an abnormal blood glucose today? Yes  120's per pt.   Since previous encounter, have you referred patient for abnormal blood glucose that resulted in a new diagnosis or medication change? No   Was there a life-saving intervention made? No     pt c/o pain Rt wrist when she moves, no swelling or warm. She was using bengay  And wrist protector.  Will f/u if not better will make an appointment with MD

## 2016-06-04 ENCOUNTER — Encounter: Payer: Self-pay | Admitting: *Deleted

## 2016-06-04 NOTE — Congregational Nurse Program (Unsigned)
Congregational Nurse Program Note  Date of Encounter: 06/03/2016 Past Medical History: Past Medical History:  Diagnosis Date  . Aortic atherosclerosis ([place])   . Diabetes mellitus   . Diverticulosis   . Dyspepsia   . Fecal incontinence   . Gastritis   . GERD (gastroesophageal reflux disease)   . Hemorrhoids   . Hiatal hernia   . Hyperlipidemia   . Hypertension   . Hypothyroidism   . IBS (irritable bowel syndrome)   . Osteoporosis   . UTI (lower urinary tract infection)   . Vitamin D deficiency     Encounter Details:     CNP Questionnaire - 06/03/16 1300      Patient Demographics   Is this a new or existing patient? Existing   Patient is considered a/an Immigrant   Race Asian     Patient Assistance   Location of Patient Assistance Not Applicable   Patient's financial/insurance status Medicare   Uninsured Patient ([facility]) No   Patient referred to apply for the following financial assistance Not Applicable   Food insecurities addressed Not Applicable   Transportation assistance No   Assistance securing medications Yes  check with [facility]  ordered pyridium @ over the counter drug   Type of Assistance Other  medication order   Educational health offerings Not Applicable;Safety;Other;Medications     Encounter Details   Primary purpose of visit Safety;Other   Was an Emergency Department visit averted? Not Applicable   Does patient have a medical provider? No   Patient referred to Not Applicable   Was a mental health screening completed? (GAINS tool) No   Does patient have dental issues? No   Does patient have vision issues? No   Does your patient have an abnormal blood pressure today? No  not checked   Since previous encounter, have you referred patient for abnormal blood pressure that resulted in a new diagnosis or medication change? No  pt check her own at home, 120's per pt   Does your patient have an abnormal blood glucose today? No   Since  previous encounter, have you referred patient for abnormal blood glucose that resulted in a new diagnosis or medication change? No     pt said her medicines came in but not cholesterol med. Also pyridium is out, had to buy at over the counter but was expensive. Told her I will check [place] and order for her.

## 2016-06-21 DIAGNOSIS — D649 Anemia, unspecified: Secondary | ICD-10-CM | POA: Diagnosis not present

## 2016-06-21 DIAGNOSIS — E1165 Type 2 diabetes mellitus with hyperglycemia: Secondary | ICD-10-CM | POA: Diagnosis not present

## 2016-06-28 DIAGNOSIS — M25561 Pain in right knee: Secondary | ICD-10-CM | POA: Diagnosis not present

## 2016-06-28 DIAGNOSIS — S8992XA Unspecified injury of left lower leg, initial encounter: Secondary | ICD-10-CM | POA: Diagnosis not present

## 2016-06-28 DIAGNOSIS — G8929 Other chronic pain: Secondary | ICD-10-CM | POA: Diagnosis not present

## 2016-06-28 DIAGNOSIS — Z0000 Encounter for general adult medical examination without abnormal findings: Secondary | ICD-10-CM | POA: Diagnosis not present

## 2016-06-28 DIAGNOSIS — M25562 Pain in left knee: Secondary | ICD-10-CM | POA: Diagnosis not present

## 2016-06-28 DIAGNOSIS — M25531 Pain in right wrist: Secondary | ICD-10-CM | POA: Diagnosis not present

## 2016-06-28 DIAGNOSIS — S8991XA Unspecified injury of right lower leg, initial encounter: Secondary | ICD-10-CM | POA: Diagnosis not present

## 2016-06-29 ENCOUNTER — Telehealth: Payer: Self-pay | Admitting: *Deleted

## 2016-06-29 ENCOUNTER — Encounter: Payer: Self-pay | Admitting: *Deleted

## 2016-06-29 NOTE — Congregational Nurse Program (Signed)
Congregational Nurse Program Note  Date of Encounter: 06/28/2016 Past Medical History: Past Medical History:  Diagnosis Date  . Aortic atherosclerosis ([place])   . Diabetes mellitus   . Diverticulosis   . Dyspepsia   . Fecal incontinence   . Gastritis   . GERD (gastroesophageal reflux disease)   . Hemorrhoids   . Hiatal hernia   . Hyperlipidemia   . Hypertension   . Hypothyroidism   . IBS (irritable bowel syndrome)   . Osteoporosis   . UTI (lower urinary tract infection)   . Vitamin D deficiency     Encounter Details:     CNP Questionnaire - 06/28/16 1100      Patient Demographics   Is this a new or existing patient? Existing   Patient is considered a/an Immigrant   Race Asian     Patient Assistance   Location of Patient Assistance Not Applicable   Patient's financial/insurance status Medicare   Uninsured Patient ([facility]) No   Patient referred to apply for the following financial assistance Not Applicable   Food insecurities addressed Not Applicable   Transportation assistance No   Assistance securing medications No   Educational health offerings Safety;Navigating the healthcare system  translate     Encounter Details   Primary purpose of visit [place]   Was an Emergency Department visit averted? Not Applicable   Does patient have a medical provider? Yes   Patient referred to Not Applicable   Was a mental health screening completed? (GAINS tool) No   Does patient have dental issues? No   Does patient have vision issues? No   Does your patient have an abnormal blood pressure today? No   Since previous encounter, have you referred patient for abnormal blood pressure that resulted in a new diagnosis or medication change? No   Does your patient have an abnormal blood glucose today? No  not checked   Since previous encounter, have you referred patient for abnormal blood glucose that resulted in a new diagnosis or medication  change? No   Was there a life-saving intervention made? No      Visited Dr. Julianne Rice office for f/u, also fell x2 last in 28month, c/o Rt wrist pain, and both Knee pain. Lab result reviwed by Dr. Maudie Mercury, Hgb, 9.0, He thought bone marrow is not producing Blood, he will refer to Dr. Beryle Beams, blood specialist, and x-ray done bilateral Knee and Rt wrist. 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 Stop Fosamax and Stop Iron, f/u 69month. With Dr. Maudie Mercury.

## 2016-07-06 ENCOUNTER — Telehealth: Payer: Self-pay | Admitting: *Deleted

## 2016-07-06 ENCOUNTER — Encounter: Payer: Self-pay | Admitting: *Deleted

## 2016-07-06 DIAGNOSIS — M25461 Effusion, right knee: Secondary | ICD-10-CM | POA: Diagnosis not present

## 2016-07-06 DIAGNOSIS — M25569 Pain in unspecified knee: Secondary | ICD-10-CM | POA: Diagnosis not present

## 2016-07-06 DIAGNOSIS — M25562 Pain in left knee: Secondary | ICD-10-CM

## 2016-07-06 DIAGNOSIS — M25561 Pain in right knee: Secondary | ICD-10-CM | POA: Insufficient documentation

## 2016-07-06 NOTE — Congregational Nurse Program (Signed)
Congregational Nurse Program Note  Date of Encounter: 07/06/2016  Past Medical History: Past Medical History:  Diagnosis Date  . Aortic atherosclerosis ([place])   . Diabetes mellitus   . Diverticulosis   . Dyspepsia   . Fecal incontinence   . Gastritis   . GERD (gastroesophageal reflux disease)   . Hemorrhoids   . Hiatal hernia   . Hyperlipidemia   . Hypertension   . Hypothyroidism   . IBS (irritable bowel syndrome)   . Osteoporosis   . UTI (lower urinary tract infection)   . Vitamin D deficiency     Encounter Details:     CNP Questionnaire - 07/06/16 1507      Patient Demographics   Is this a new or existing patient? Existing   Patient is considered a/an Immigrant   Race Asian     Patient Assistance   Location of Patient Assistance Not Applicable   Patient's financial/insurance status Medicare   Uninsured Patient ([facility]) No   Patient referred to apply for the following financial assistance Not Applicable   Food insecurities addressed Not Applicable   Transportation assistance Yes   Type of Land medications No   Educational Software engineer     Encounter Details   Primary purpose of visit [place];Other  translated, Rhematology clinic   Was an Emergency Department visit averted? Not Applicable   Does patient have a medical provider? Yes   Patient referred to Clinic;Follow up with established PCP   Was a mental health screening completed? (GAINS tool) No   Does patient have dental issues? No   Does patient have vision issues? No   Does your patient have an abnormal blood pressure today? No   Since previous encounter, have you referred patient for abnormal blood pressure that resulted in a new diagnosis or medication change? No   Does your patient have an abnormal blood glucose today? No   Since previous encounter, have you referred patient for abnormal blood glucose that  resulted in a new diagnosis or medication change? No   Was there a life-saving intervention made? No    visited rheumatology Dr. Haydee Salter. Checked Rt knee, ultra sonogram done,  Said no fluid shown. Might be soft tissue injury or meniscus tears. Try bolteran jells to painful area and ibuprofen TID, If not better, call back to refer to orthopedic MD.

## 2016-07-13 ENCOUNTER — Encounter: Payer: Self-pay | Admitting: Oncology

## 2016-07-13 ENCOUNTER — Ambulatory Visit (INDEPENDENT_AMBULATORY_CARE_PROVIDER_SITE_OTHER): Payer: Medicare Other | Admitting: Oncology

## 2016-07-13 ENCOUNTER — Other Ambulatory Visit: Payer: Self-pay | Admitting: Oncology

## 2016-07-13 VITALS — BP 127/41 | HR 88 | Temp 98.2°F | Ht 59.0 in | Wt 111.2 lb

## 2016-07-13 DIAGNOSIS — Z7982 Long term (current) use of aspirin: Secondary | ICD-10-CM | POA: Diagnosis not present

## 2016-07-13 DIAGNOSIS — N399 Disorder of urinary system, unspecified: Secondary | ICD-10-CM | POA: Diagnosis not present

## 2016-07-13 DIAGNOSIS — Z86010 Personal history of colonic polyps: Secondary | ICD-10-CM

## 2016-07-13 DIAGNOSIS — I10 Essential (primary) hypertension: Secondary | ICD-10-CM

## 2016-07-13 DIAGNOSIS — Z881 Allergy status to other antibiotic agents status: Secondary | ICD-10-CM

## 2016-07-13 DIAGNOSIS — Z8719 Personal history of other diseases of the digestive system: Secondary | ICD-10-CM | POA: Diagnosis not present

## 2016-07-13 DIAGNOSIS — E785 Hyperlipidemia, unspecified: Secondary | ICD-10-CM | POA: Diagnosis not present

## 2016-07-13 DIAGNOSIS — E039 Hypothyroidism, unspecified: Secondary | ICD-10-CM

## 2016-07-13 DIAGNOSIS — D592 Drug-induced nonautoimmune hemolytic anemia: Secondary | ICD-10-CM

## 2016-07-13 DIAGNOSIS — E119 Type 2 diabetes mellitus without complications: Secondary | ICD-10-CM

## 2016-07-13 DIAGNOSIS — D539 Nutritional anemia, unspecified: Secondary | ICD-10-CM | POA: Insufficient documentation

## 2016-07-13 DIAGNOSIS — Z79899 Other long term (current) drug therapy: Secondary | ICD-10-CM | POA: Diagnosis not present

## 2016-07-13 DIAGNOSIS — Z7984 Long term (current) use of oral hypoglycemic drugs: Secondary | ICD-10-CM

## 2016-07-13 DIAGNOSIS — Z888 Allergy status to other drugs, medicaments and biological substances status: Secondary | ICD-10-CM

## 2016-07-13 DIAGNOSIS — D596 Hemoglobinuria due to hemolysis from other external causes: Secondary | ICD-10-CM

## 2016-07-13 HISTORY — DX: Other disorders of iron metabolism: E83.19

## 2016-07-13 HISTORY — DX: Nutritional anemia, unspecified: D53.9

## 2016-07-13 LAB — RETICULOCYTES
RBC.: 2.52 MIL/uL — ABNORMAL LOW (ref 3.87–5.11)
Retic Count, Absolute: 199.1 10*3/uL — ABNORMAL HIGH (ref 19.0–186.0)
Retic Ct Pct: 7.9 % — ABNORMAL HIGH (ref 0.4–3.1)

## 2016-07-13 LAB — CBC WITH DIFFERENTIAL/PLATELET
Basophils Absolute: 0.1 10*3/uL (ref 0.0–0.1)
Basophils Relative: 1 %
EOS PCT: 1 %
Eosinophils Absolute: 0.1 10*3/uL (ref 0.0–0.7)
HCT: 26.5 % — ABNORMAL LOW (ref 36.0–46.0)
Hemoglobin: 8.3 g/dL — ABNORMAL LOW (ref 12.0–15.0)
LYMPHS PCT: 23 %
Lymphs Abs: 1.9 10*3/uL (ref 0.7–4.0)
MCH: 32.9 pg (ref 26.0–34.0)
MCHC: 31.3 g/dL (ref 30.0–36.0)
MCV: 105.2 fL — AB (ref 78.0–100.0)
MONO ABS: 0.4 10*3/uL (ref 0.1–1.0)
Monocytes Relative: 4 %
Neutro Abs: 5.7 10*3/uL (ref 1.7–7.7)
Neutrophils Relative %: 71 %
PLATELETS: 227 10*3/uL (ref 150–400)
RBC: 2.52 MIL/uL — ABNORMAL LOW (ref 3.87–5.11)
RDW: 15 % (ref 11.5–15.5)
WBC: 8.1 10*3/uL (ref 4.0–10.5)

## 2016-07-13 LAB — LACTATE DEHYDROGENASE: LDH: 168 U/L (ref 98–192)

## 2016-07-13 LAB — FERRITIN: FERRITIN: 97 ng/mL (ref 11–307)

## 2016-07-13 LAB — IRON AND TIBC
Iron: 59 ug/dL (ref 28–170)
SATURATION RATIOS: 23 % (ref 10.4–31.8)
TIBC: 255 ug/dL (ref 250–450)
UIBC: 196 ug/dL

## 2016-07-13 LAB — SAVE SMEAR

## 2016-07-13 NOTE — Patient Instructions (Addendum)
Schedule bone marrow biopsy at [facility]  8:45 AM Tuesday May 15 CBC same day MD vist 6/5 to discuss results

## 2016-07-13 NOTE — Progress Notes (Signed)
New Patient Hematology   Lauren [name] [id number] 15-Mar-1941 76 y.o. 07/13/2016  CC: Dr. Jani Gravel   Reason for referral: Evaluate macrocytic anemia with normal E33 and folic acid levels   HPI: 76 year old Asian woman from [place] who has been in overall excellent health.  She has treated hypertension, hyperlipidemia, and type 2 diabetes on oral agents.  Hypothyroid on replacement.  She has chronic urinary tract symptoms for which she is taking Pyridium.  This was discontinued on June 28, 2016 visit to her primary care physician but the patient states she is still taking it.  Laboratory data provided back through June 09, 2015 when hemoglobin was 9.3, hematocrit 29, MCV 106, white count 5100 with 52% neutrophils, 38% lymphocytes, 8 monocytes, 2 eosinophils and platelet count 235,000.  B12 level was greater than 2000.  Folic acid level was drawn but result not available.  I assume it was normal as well.  She was put on an iron supplement.  Her hemoglobin has not changed at all and on June 21, 2016 remained at 9 g.  White count and platelets remain normal.  Additional lab testing included serum iron of 118 with a percent transferrin saturation of 50%.  Ferritin 201 (15-1 50) done on June 21, 2016.  Normal liver functions recorded as recently as June 21, 2016 with SGOT 1 18, SGPT PT 19, bilirubin 0.5.  Normal renal function with BUN 20, creatinine 0.5.  TSH 3.9 on Synthroid 75 mcg daily.  Serum protein electrophoresis done on June 10, 2015 was normal.  No M spike. She is asymptomatic at rest.  She gets dyspneic if she walks up a hill.  No chest pain or dyspnea at rest. She has no signs or symptoms of a collagen vascular disorder.  She does not eat much but her weight has been steady. She has not noted any hematochezia, hematuria, or vaginal bleeding.  She has never had any surgical procedures other than endoscopies.  Most recent colonoscopy done June 03, 2015 with mild diverticulosis and a small polyp  removed from the hepatic flexure.  Upper endoscopy done at that time also negative. She denies any exposure to therapeutic radiation or toxic chemicals/organic solvents.  She does use hair dye. She denies any history of hepatitis, yellow jaundice, malaria, or tuberculosis. There is no family history of anemia.  Mother lived until age 63, father until age 38, she has 1 brother alive and well at age 40.  63 children son 63, daughter 43, son 18, all healthy.   PMH: Past Medical History:  Diagnosis Date  . Aortic atherosclerosis ([place])   . Diabetes mellitus   . Diverticulosis   . Dyspepsia   . Fecal incontinence   . Gastritis   . GERD (gastroesophageal reflux disease)   . Hemorrhoids   . Hiatal hernia   . Hyperlipidemia   . Hypertension   . Hypothyroidism   . IBS (irritable bowel syndrome)   . Iron overload 07/13/2016  . 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 Osteoporosis   . UTI (lower urinary tract infection)   . Vitamin D deficiency     Past Surgical History:  Procedure Laterality Date  . COLONOSCOPY      Allergies: Allergies  Allergen Reactions  . Ciprofloxacin Hcl     Sx's almost like a heart attack  . Esomeprazole Magnesium Nausea Only    Pain and nausea  . Macrodantin [Nitrofurantoin Macrocrystal] Other (See Comments)    GI Upset  . Pantoprazole Sodium Nausea Only  abd pain and nausea    Medications:  Current Outpatient Prescriptions:  .  aspirin 81 MG tablet, Take 81 mg by mouth daily.  , Disp: , Rfl:  .  Cholecalciferol (VITAMIN D3) 2000 UNITS TABS, Take 1 tablet by mouth daily., Disp: , Rfl:  .  folic acid (FOLVITE) 1 MG tablet, Take 1 mg by mouth daily., Disp: , Rfl:  .  glucosamine-chondroitin 500-400 MG tablet, Take 1 tablet by mouth 3 (three) times daily., Disp: , Rfl:  .  levothyroxine (SYNTHROID, LEVOTHROID) 75 MCG tablet, Take 75 mcg by mouth daily.  , Disp: , Rfl:  .  losartan (COZAAR) 100 MG tablet, Take 100 mg by mouth daily.  , Disp: , Rfl:  .  Magnesium  400 MG CAPS, Take 1 capsule by mouth daily., Disp: , Rfl:  .  Multiple Vitamin (MULTIVITAMIN) tablet, Take 1 tablet by mouth daily., Disp: , Rfl:  .  omeprazole (PRILOSEC) 40 MG capsule, Take 1 capsule (40 mg total) by mouth daily., Disp: 30 capsule, Rfl: 1 .  phenazopyridine (PYRIDIUM) 100 MG tablet, Take 1 tablet (100 mg total) by mouth 3 (three) times daily as needed for pain., Disp: 10 tablet, Rfl: 0 .  pravastatin (PRAVACHOL) 40 MG tablet, Take 40 mg by mouth daily.  , Disp: , Rfl:  .  sitaGLIPtan-metformin (JANUMET) 50-500 MG per tablet, Take 1 tablet by mouth 2 (two) times daily with a meal.  , Disp: , Rfl:  .  vitamin B-12 (CYANOCOBALAMIN) 100 MCG tablet, Take 100 mcg by mouth daily., Disp: , Rfl:   Social History: Widowed.  3 children.  She does not speak [place].  History obtained with a [place] translator who was present for the entire interview and exam. she has never smoked.  she does not drink alcohol or use drugs.  Family History: Family History  Problem Relation Age of Onset  . Colon cancer Neg Hx     Review of Systems: She does have reflux symptoms.  Chronic urinary hesitancy. Remaining ROS negative.  Physical Exam: Blood pressure (!) 127/41, pulse 88, temperature 98.2 F (36.8 C), temperature source Oral, height '4\' 11"'$  (1.499 m), weight 111 lb 3.2 oz (50.4 kg), SpO2 95 %. Wt Readings from Last 3 Encounters:  07/13/16 111 lb 3.2 oz (50.4 kg)  06/03/15 119 lb (54 kg)  05/27/15 119 lb (54 kg)     General appearance: [place] female.  Skin appears mildly jaundiced but hard to tell in view of her Asian background;  the sclerae do not appear icteric. HENNT: Pharynx no erythema, exudate, mass, or ulcer. No thyromegaly or thyroid nodules Lymph nodes: No cervical, supraclavicular, or axillary lymphadenopathy Breasts: Lungs: Clear to auscultation, resonant to percussion throughout Heart: Regular rhythm, no murmur, no gallop, no rub, no click, no edema Abdomen: Soft,  nontender, normal bowel sounds, no mass, no organomegaly Extremities: No edema, no calf tenderness Musculoskeletal: no joint deformities GU:  Vascular: Carotid pulses 2+, no bruits, distal pulses: Dorsalis pedis 1+ symmetric Neurologic: Alert, oriented, PERRLA, optic discs sharp and vessels normal, no hemorrhage or exudate, cranial nerves grossly normal, motor strength 5 over 5, reflexes 1+ symmetric, upper body coordination normal, gait normal, Skin: No rash or ecchymosis    Lab Results: Lab Results  Component Value Date   WBC 8.1 07/13/2016   HGB 8.3 (L) 07/13/2016   HCT 26.5 (L) 07/13/2016   MCV 105.2 (H) 07/13/2016   PLT 227 07/13/2016     Chemistry  Component Value Date/Time   NA 137 01/31/2015 1255   K 4.3 01/31/2015 1255   CL 104 01/31/2015 1255   CO2 26 01/31/2015 1255   BUN 11 01/31/2015 1255   CREATININE 0.72 01/31/2015 1255      Component Value Date/Time   CALCIUM 8.6 01/31/2015 1255   CALCIUM 8.7 04/15/2011 1003   ALKPHOS 54 01/31/2015 1255   AST 20 01/31/2015 1255   ALT 20 01/31/2015 1255   BILITOT 0.4 01/31/2015 1255    Reticulocyte count uncorrected 7.9% LDH 168.  Bilirubin 0.5 Erythropoietin 53.2 (2.6-18.5) Repeat iron studies: Iron now normal at 59 with percent saturation 23 and ferritin 97      Review of peripheral blood film: Normochromic, macrocytic, red cells.  2+ bite cells.  No inclusions.  No basophilic stippling.  1+ polychromasia.  No spherocytes.  Neutrophils and lymphocytes are mature.  Platelets are normal in number and appearance.  Radiological Studies: No results found.    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  Ferritin is elevated.  Iron saturation is increased.  In refractory anemia sideroblastic type, iron cannot be utilized properly and ferritin is typically mildly elevated.  This can also be seen with the 5Q minus syndrome.   It is also possible that she has a separate problem unrelated to the anemia i.e. a hemochromatosis trait given the elevated iron saturation. Presence of bite cells on the peripheral blood film suggest underlying thalassemia trait but thalassemia generally associated with microcytosis and not macrocytosis.  LDH today 168.  Bilirubin 0.5 on April 9 but reticulocyte count increased at 7.9% It is possible that the increased MCV is reflective of a hemolytic process and not a myelodysplastic process.  I am most concerned with the recent use of Pyridium.  Repeat iron studies now available and do not show increased iron saturation or increased ferritin.  Recommendation: We discussed bone marrow biopsy.  She is agreeable.  I will schedule her to come back and do this on Tuesday, May 15.  Further recommendations based on results. Although she would qualify for an erythropoietin stimulating agent with a hemoglobin of less than 10 g, since she is currently asymptomatic, and hemoglobin has been stable over at least one year observation, I would probably reserve this for any progression in the future.  If more aggressive treatment would be necessary, the bone marrow findings will help to determine this with respect to percent blasts and any poor prognosis cytogenetic changes.  I told her that she should not take the Pyridium.  This has been associated with a number of hematologic side effects including exacerbation of underlying G6PD deficiency and the occurrence of methemoglobinemia.  The bite cells seen on the peripheral blood film and the elevated reticulocyte count suggest possibility of low-grade hemolysis related to this medication.  We need to screen her for G6PD deficiency.  Also consider hemoglobin electrophoresis.  I do not think that this lady needs another colonoscopy.  Repeat hemoglobin today down to 8 g.  Patient will be called back in for additional testing.    Murriel Hopper, MD, [place]   Hematology-Oncology/Internal Medicine  07/13/2016, 3:05 PM

## 2016-07-14 ENCOUNTER — Encounter: Payer: Self-pay | Admitting: Oncology

## 2016-07-14 ENCOUNTER — Encounter: Payer: Self-pay | Admitting: *Deleted

## 2016-07-14 ENCOUNTER — Telehealth: Payer: Self-pay | Admitting: *Deleted

## 2016-07-14 ENCOUNTER — Other Ambulatory Visit: Payer: Self-pay | Admitting: Oncology

## 2016-07-14 ENCOUNTER — Telehealth: Payer: Self-pay | Admitting: Internal Medicine

## 2016-07-14 DIAGNOSIS — D592 Drug-induced nonautoimmune hemolytic anemia: Secondary | ICD-10-CM

## 2016-07-14 HISTORY — DX: Drug-induced nonautoimmune hemolytic anemia: D59.2

## 2016-07-14 LAB — ERYTHROPOIETIN: ERYTHROPOIETIN: 53.2 m[IU]/mL — AB (ref 2.6–18.5)

## 2016-07-14 NOTE — Telephone Encounter (Signed)
Talked to congregational nurse,[name] Tasia Catchings - stated she's out of town today but will be able to bring pt tomorrow afternoon around 4 PM. And she will call and inform pt.

## 2016-07-14 NOTE — Congregational Nurse Program (Signed)
Congregational Nurse Program Note  Date of Encounter: 07/13/2016 Past Medical History: Past Medical History:  Diagnosis Date  . Aortic atherosclerosis ([place])   . Diabetes mellitus   . Diverticulosis   . Dyspepsia   . Fecal incontinence   . Gastritis   . GERD (gastroesophageal reflux disease)   . Hemorrhoids   . Hiatal hernia   . Hyperlipidemia   . Hypertension   . Hypothyroidism   . IBS (irritable bowel syndrome)   . Iron overload 07/13/2016  . 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 Osteoporosis   . UTI (lower urinary tract infection)   . Vitamin D deficiency     Encounter Details:     CNP Questionnaire - 07/13/16 1600      Patient Demographics   Is this a new or existing patient? Existing   Patient is considered a/an Immigrant   Race Asian     Patient Assistance   Location of Patient Assistance Not Applicable  Clinic   Patient's financial/insurance status Medicare   Uninsured Patient ([facility]) No   Patient referred to apply for the following financial assistance Not Applicable   Food insecurities addressed Not Applicable   Transportation assistance Yes   Type of Assistance Volunteer   Assistance securing medications No   Educational health offerings Other  anemia per Dr. Beryle Beams     Encounter Details   Primary purpose of visit Lauren [name];Other  Dr. Andrez Grime   Was an Emergency Department visit averted? Not Applicable   Does patient have a medical provider? Yes   Patient referred to Not Applicable   Was a mental health screening completed? (GAINS tool) No   Does patient have dental issues? No   Does patient have vision issues? No   Does your patient have an abnormal blood pressure today? No   Since previous encounter, have you referred patient for abnormal blood pressure that resulted in a new diagnosis or medication change? No   Does your patient have an abnormal blood glucose today? No   Since previous encounter, have  you referred patient for abnormal blood glucose that resulted in a new diagnosis or medication change? No   Was there a life-saving intervention made? No         Clinical Intake - 07/13/16 1416      Nutrition Screen   BMI - recorded --     Visit Dr. Beryle Beams, explained anemia and treatment, result of lab, next step is bone marrow test. Choice given to Lauren [name] by MD, she decided to do at Dr's office. Scheduled on 07/27/2016.  Also need DC Pyridium what affected anemia per Dr. Darnell Level.  Pt. Concerned regarding UTI, but she would DC for now.

## 2016-07-14 NOTE — Telephone Encounter (Signed)
-----  Message from Annia Belt, MD sent at 07/14/2016 10:43 AM EDT ----- Holley Raring - can we call her back for additional labs please - her anemia may be due to a medication reaction. I may be able to save her from a bone marrow biopsy. Will need a [place].

## 2016-07-14 NOTE — Telephone Encounter (Signed)
APT. REMINDER CALL, NO ANSWER, NO VOICEMAIL °

## 2016-07-14 NOTE — Telephone Encounter (Signed)
OK - thanks

## 2016-07-15 ENCOUNTER — Other Ambulatory Visit (INDEPENDENT_AMBULATORY_CARE_PROVIDER_SITE_OTHER): Payer: Medicare Other

## 2016-07-15 ENCOUNTER — Encounter: Payer: Self-pay | Admitting: *Deleted

## 2016-07-15 DIAGNOSIS — D592 Drug-induced nonautoimmune hemolytic anemia: Secondary | ICD-10-CM

## 2016-07-15 DIAGNOSIS — D596 Hemoglobinuria due to hemolysis from other external causes: Secondary | ICD-10-CM

## 2016-07-15 NOTE — Congregational Nurse Program (Signed)
Congregational Nurse Program Note  Date of Encounter: 07/15/2016  Past Medical History: Past Medical History:  Diagnosis Date  . Aortic atherosclerosis ([place])   . Diabetes mellitus   . Diverticulosis   . Dyspepsia   . Fecal incontinence   . Gastritis   . GERD (gastroesophageal reflux disease)   . 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 Hemorrhoids   . Hiatal hernia   . Hyperlipidemia   . Hypertension   . Hypothyroidism   . IBS (irritable bowel syndrome)   . Iron overload 07/13/2016  . 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 Osteoporosis   . UTI (lower urinary tract infection)   . Vitamin D deficiency     Encounter Details:     CNP Questionnaire - 07/15/16 1100      Patient Demographics   Is this a new or existing patient? Existing   Patient is considered a/an Immigrant   Race Asian     Patient Assistance   Location of Patient Assistance Not Applicable   Patient's financial/insurance status Medicare   Uninsured Patient ([facility]) No   Patient referred to apply for the following financial assistance Not Applicable   Food insecurities addressed Not Applicable   Transportation assistance Yes   Type of Land medications No   Educational health offerings Other     Encounter Details   Primary purpose of visit [place];Other   Was an Emergency Department visit averted? Not Applicable   Does patient have a medical provider? Yes   Patient referred to Not Applicable   Was a mental health screening completed? (GAINS tool) No   Does patient have dental issues? No   Does patient have vision issues? No   Does your patient have an abnormal blood pressure today? No   Since previous encounter, have you referred patient for abnormal blood pressure that resulted in a new diagnosis or medication change? No   Does your patient have an abnormal blood  glucose today? No   Since previous encounter, have you referred patient for abnormal blood glucose that resulted in a new diagnosis or medication change? No   Was there a life-saving intervention made? No         Clinical Intake - 07/13/16 1416      Nutrition Screen   BMI - recorded --     Dr. Waymon Budge office called need additional lab, called Mrs.Strehl to get lab today.questioned what kind test was that, and the Dr. Bryan Lemma well and according to test, will decide to do  bonemarrow test or not. It takes a week to get result, will notify me. He will order folic acid 1g/ day. Checked [facility], no order receivedper pharmacy. Over the count drug bought and gave to Pt. Explained why needed this med. She stopped Pyridium already, and cont. To talke folic acid

## 2016-07-16 ENCOUNTER — Other Ambulatory Visit: Payer: Self-pay | Admitting: Oncology

## 2016-07-16 DIAGNOSIS — D539 Nutritional anemia, unspecified: Secondary | ICD-10-CM

## 2016-07-16 DIAGNOSIS — D592 Drug-induced nonautoimmune hemolytic anemia: Secondary | ICD-10-CM

## 2016-07-17 LAB — GLUCOSE 6 PHOSPHATE DEHYDROGENASE
G-6-PD, QUANT: 13 U/g{Hb} (ref 4.6–13.5)
Hemoglobin: 8.5 g/dL — ABNORMAL LOW (ref 11.1–15.9)

## 2016-07-17 LAB — HEMOGLOBINOPATHY EVALUATION
HGB C: 0 %
HGB S: 0 %
HGB VARIANT: 0 %
Hemoglobin A2 Quantitation: 2.2 % (ref 1.8–3.2)
Hemoglobin F Quantitation: 0.8 % (ref 0.0–2.0)
Hgb A: 97 % (ref 96.4–98.8)

## 2016-07-17 LAB — HAPTOGLOBIN

## 2016-07-17 LAB — METHEMOGLOBIN, BLOOD: METHEMOGLOBIN, BLOOD: 4.1 % — AB (ref 0.4–1.5)

## 2016-07-19 ENCOUNTER — Telehealth: Payer: Self-pay | Admitting: *Deleted

## 2016-07-19 NOTE — Telephone Encounter (Signed)
-----  Message from Annia Belt, MD sent at 07/16/2016  5:29 PM EDT ----- Please cancel bone marrow biopsy & let patient and lab know. She is having a reaction to the med I told her to stop - it should get better on it's own. See if we can get her in just for lab when I get back - I will put in orders Thanks DrG

## 2016-07-19 NOTE — Telephone Encounter (Signed)
Lauren [name], at [facility] path-cancel bone marrow bx. Also called [name], Scientist, research (physical sciences) - informed BM Bx has been canceled "She is having a reaction to the med I told her to stop - it should get better on it's own." per Dr Beryle Beams. Also Dr Darnell Level requesting labs - lab appt scheduled Tues 5/15/@ 0900 AM.

## 2016-07-20 LAB — GLUCOSE, POCT (MANUAL RESULT ENTRY): POC Glucose: 122 mg/dl — AB (ref 70–99)

## 2016-07-25 ENCOUNTER — Encounter: Payer: Self-pay | Admitting: *Deleted

## 2016-07-25 DIAGNOSIS — Z139 Encounter for screening, unspecified: Secondary | ICD-10-CM

## 2016-07-27 ENCOUNTER — Telehealth: Payer: Self-pay | Admitting: *Deleted

## 2016-07-27 ENCOUNTER — Ambulatory Visit: Payer: Medicare Other | Admitting: Oncology

## 2016-07-27 ENCOUNTER — Ambulatory Visit (INDEPENDENT_AMBULATORY_CARE_PROVIDER_SITE_OTHER): Payer: Medicare Other | Admitting: Oncology

## 2016-07-27 ENCOUNTER — Other Ambulatory Visit: Payer: Self-pay | Admitting: Oncology

## 2016-07-27 ENCOUNTER — Other Ambulatory Visit: Payer: Medicare Other

## 2016-07-27 DIAGNOSIS — T398X5A Adverse effect of other nonopioid analgesics and antipyretics, not elsewhere classified, initial encounter: Secondary | ICD-10-CM | POA: Diagnosis not present

## 2016-07-27 DIAGNOSIS — D592 Drug-induced nonautoimmune hemolytic anemia: Secondary | ICD-10-CM

## 2016-07-27 LAB — CBC WITH DIFFERENTIAL/PLATELET
BASOS PCT: 1 %
Basophils Absolute: 0.1 10*3/uL (ref 0.0–0.1)
EOS ABS: 0.1 10*3/uL (ref 0.0–0.7)
EOS PCT: 1 %
HCT: 31 % — ABNORMAL LOW (ref 36.0–46.0)
HEMOGLOBIN: 10 g/dL — AB (ref 12.0–15.0)
LYMPHS ABS: 1.8 10*3/uL (ref 0.7–4.0)
Lymphocytes Relative: 33 %
MCH: 33.1 pg (ref 26.0–34.0)
MCHC: 32.3 g/dL (ref 30.0–36.0)
MCV: 102.6 fL — ABNORMAL HIGH (ref 78.0–100.0)
MONO ABS: 0.4 10*3/uL (ref 0.1–1.0)
MONOS PCT: 6 %
NEUTROS PCT: 59 %
Neutro Abs: 3.2 10*3/uL (ref 1.7–7.7)
Platelets: 226 10*3/uL (ref 150–400)
RBC: 3.02 MIL/uL — ABNORMAL LOW (ref 3.87–5.11)
RDW: 13.3 % (ref 11.5–15.5)
WBC: 5.5 10*3/uL (ref 4.0–10.5)

## 2016-07-27 LAB — COMPREHENSIVE METABOLIC PANEL
ALBUMIN: 3.8 g/dL (ref 3.5–5.0)
ALT: 19 U/L (ref 14–54)
AST: 21 U/L (ref 15–41)
Alkaline Phosphatase: 53 U/L (ref 38–126)
Anion gap: 7 (ref 5–15)
BUN: 18 mg/dL (ref 6–20)
CHLORIDE: 108 mmol/L (ref 101–111)
CO2: 22 mmol/L (ref 22–32)
Calcium: 9.5 mg/dL (ref 8.9–10.3)
Creatinine, Ser: 0.52 mg/dL (ref 0.44–1.00)
GFR calc Af Amer: >60 mL/min (ref >60)
Glucose, Bld: 128 mg/dL — ABNORMAL HIGH (ref 65–99)
POTASSIUM: 4 mmol/L (ref 3.5–5.1)
SODIUM: 137 mmol/L (ref 135–145)
Total Bilirubin: 0.9 mg/dL (ref 0.3–1.2)
Total Protein: 6.4 g/dL — ABNORMAL LOW (ref 6.5–8.1)

## 2016-07-27 LAB — RETICULOCYTES
RBC.: 3.02 MIL/uL — ABNORMAL LOW (ref 3.87–5.11)
RETIC CT PCT: 2.3 % (ref 0.4–3.1)
Retic Count, Absolute: 69.5 10*3/uL (ref 19.0–186.0)

## 2016-07-27 LAB — DIRECT ANTIGLOBULIN TEST (NOT AT ARMC)
DAT, COMPLEMENT: NEGATIVE
DAT, IGG: NEGATIVE

## 2016-07-27 LAB — LACTATE DEHYDROGENASE: LDH: 174 U/L (ref 98–192)

## 2016-07-27 LAB — SAVE SMEAR

## 2016-07-27 NOTE — Telephone Encounter (Signed)
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

## 2016-07-27 NOTE — Telephone Encounter (Signed)
Called and talked to [employer], Scientist, research (physical sciences) - informed pt's "Hb up to 10 - great progress!" per Dr Beryle Beams. Stated she will inform pt.

## 2016-07-27 NOTE — Progress Notes (Signed)
Hematology follow-up note: Follow-up visit for this pleasant 76 year old [place] woman I evaluated on Jul 13, 2016 for a macrocytic anemia.  She appeared slightly jaundiced.  A recent chemistry profile through her family 44 office showed a normal bilirubin.  I checked an LDH which was normal at 168. Her hemoglobin was 8.3 with MCV 105.  Reticulocyte count was elevated at 7.9%.  A haptoglobin was undetectable.  Oxygenation was 95% on room air.  Arterial blood gas was not done. On review of the peripheral blood film there were numerous bite cells and a rare blister cell. She had been taking Pyridium which is a known oxidized drug which has been related to exacerbations of G6PD deficiency and methemoglobinemia. G6PD level was normal at 13 U/g hemoglobin (4.6-13.5) but this could be spuriously normal during an acute hemolytic episode. Methemoglobin was elevated at 4.1%, normal 0.4-1.5%. Before I even had these results I suspected we were seeing a drug reaction to the Pyridium and told her to stop it. Additional testing included a hemoglobin electrophoresis in view of her Asian background.  This was normal with a hemoglobin A of 97%, hemoglobin F 0.8%, hemoglobin A2, 2.2%. Repeat iron studies which were abnormal suggesting possibility of concomitant hemochromatosis or other iron overload syndrome returned to normal when repeated with serum iron 59, TIBC 255, percent saturation 23, and ferritin 97. Erythropoietin level was elevated at 53 consistent with an appropriate bone marrow response to hemolysis.  Repeat CBC today off the Pyridium show significant rise in her hemoglobin up to 10 g.  Clinically she feels much better.  She is no longer jaundiced.  Direct and indirect [place] test today is negative.  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  G6PD deficiency not excluded.  Recommendation: I have added Pyridium as an allergy and I also told her to  avoid all sulfa drugs. I will wait 1 month then repeat a G6PD analysis.  100% of this visit was spent with direct face-to-face interaction with the patient, review and explanation of complicated laboratory data, with the aid of a [place] interpreter.

## 2016-07-27 NOTE — Patient Instructions (Signed)
Return for lab on June 12 MD visit 1-2 weeks after lab Stop pyridium and list as allergy: blood destruction (hemolysis) Avoid sulfa drugs like septra or bactrim: they may cause similar reaction

## 2016-07-28 NOTE — Progress Notes (Signed)
EPIC meltdown. Progress note for this day was already dictated on day of patient visit

## 2016-08-24 ENCOUNTER — Other Ambulatory Visit (INDEPENDENT_AMBULATORY_CARE_PROVIDER_SITE_OTHER): Payer: Medicare Other

## 2016-08-24 DIAGNOSIS — D592 Drug-induced nonautoimmune hemolytic anemia: Secondary | ICD-10-CM

## 2016-08-26 LAB — CBC WITH DIFFERENTIAL/PLATELET
BASOS ABS: 0 10*3/uL (ref 0.0–0.2)
Basos: 1 %
EOS (ABSOLUTE): 0.1 10*3/uL (ref 0.0–0.4)
Eos: 1 %
HEMATOCRIT: 35.5 % (ref 34.0–46.6)
HEMOGLOBIN: 11.6 g/dL (ref 11.1–15.9)
IMMATURE GRANS (ABS): 0 10*3/uL (ref 0.0–0.1)
Immature Granulocytes: 0 %
LYMPHS: 30 %
Lymphocytes Absolute: 2 10*3/uL (ref 0.7–3.1)
MCH: 33 pg (ref 26.6–33.0)
MCHC: 32.7 g/dL (ref 31.5–35.7)
MCV: 101 fL — AB (ref 79–97)
MONOCYTES: 7 %
Monocytes Absolute: 0.5 10*3/uL (ref 0.1–0.9)
Neutrophils Absolute: 4 10*3/uL (ref 1.4–7.0)
Neutrophils: 61 %
PLATELETS: 183 10*3/uL (ref 150–379)
RBC: 3.52 x10E6/uL — AB (ref 3.77–5.28)
RDW: 12.9 % (ref 12.3–15.4)
WBC: 6.6 10*3/uL (ref 3.4–10.8)

## 2016-08-26 LAB — RETICULOCYTES: Retic Ct Pct: 0.8 % (ref 0.6–2.6)

## 2016-08-26 LAB — GLUCOSE 6 PHOSPHATE DEHYDROGENASE: G-6-PD, Quant: 10.7 U/g{Hb} (ref 4.6–13.5)

## 2016-09-01 ENCOUNTER — Telehealth: Payer: Self-pay | Admitting: *Deleted

## 2016-09-01 NOTE — Telephone Encounter (Signed)
-----   Message from Annia Belt, MD sent at 08/26/2016 11:15 AM EDT ----- Call pt daughter: blood count back to normal. Special test for G6PD red blood cell enzyme deficiency is normal

## 2016-09-01 NOTE — Telephone Encounter (Signed)
I called [name], Scientist, research (physical sciences) - no answer; left message "blood count back to normal. Special test for G6PD red blood cell enzyme deficiency is normal " per Dr Beryle Beams. And call for any questions.

## 2016-09-19 ENCOUNTER — Encounter: Payer: Self-pay | Admitting: *Deleted

## 2016-09-19 NOTE — Congregational Nurse Program (Unsigned)
Congregational Nurse Program Note  Date of Encounter: 09/12/2016 Past Medical History: Past Medical History:  Diagnosis Date  . Aortic atherosclerosis ([place])   . Diabetes mellitus   . Diverticulosis   . Dyspepsia   . Fecal incontinence   . Gastritis   . GERD (gastroesophageal reflux disease)   . 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 Hemorrhoids   . Hiatal hernia   . Hyperlipidemia   . Hypertension   . Hypothyroidism   . IBS (irritable bowel syndrome)   . Iron overload 07/13/2016  . 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 Osteoporosis   . UTI (lower urinary tract infection)   . Vitamin D deficiency     Encounter Details:     CNP Questionnaire - 09/12/16 1345      Patient Demographics   Is this a new or existing patient? Existing   Patient is considered a/an Immigrant   Race Asian     Patient Assistance   Location of Patient Assistance Not Applicable  [facility]   Patient's financial/insurance status Medicare   Uninsured Patient ([facility]) No   Patient referred to apply for the following financial assistance Not Applicable   Food insecurities addressed Not Applicable   Transportation assistance No   Assistance securing medications No   Educational health offerings Diabetes;Safety;Other  Glucose meter check up     Encounter Details   Primary purpose of visit Education/Health Concerns   Was an Emergency Department visit averted? Not Applicable   Does patient have a medical provider? Yes   Patient referred to Not Applicable   Was a mental health screening completed? (GAINS tool) No   Does patient have dental issues? No   Does patient have vision issues? No   Does your patient have an abnormal blood pressure today? No   Since previous encounter, have you referred patient for abnormal blood pressure that resulted in a new diagnosis or medication change? No   Does your patient have an abnormal blood  glucose today? Yes  non fasting, CBG 179   Since previous encounter, have you referred patient for abnormal blood glucose that resulted in a new diagnosis or medication change? No   Was there a life-saving intervention made? No      Pt brought CBG machine to check for new strips. Check CBG with her glucometer Ate 20 minutes age, CBG was 179. Changed the number of strip to machine. Reminded her Dr. Antoine Poche appointment on 09/20/2016 10am.

## 2016-09-20 ENCOUNTER — Ambulatory Visit (INDEPENDENT_AMBULATORY_CARE_PROVIDER_SITE_OTHER): Payer: Medicare Other | Admitting: Oncology

## 2016-09-20 ENCOUNTER — Encounter: Payer: Self-pay | Admitting: Oncology

## 2016-09-20 VITALS — BP 163/71 | HR 64 | Temp 98.0°F | Ht 59.0 in | Wt 112.8 lb

## 2016-09-20 DIAGNOSIS — Z882 Allergy status to sulfonamides status: Secondary | ICD-10-CM

## 2016-09-20 DIAGNOSIS — D592 Drug-induced nonautoimmune hemolytic anemia: Secondary | ICD-10-CM | POA: Diagnosis not present

## 2016-09-20 DIAGNOSIS — T398X5D Adverse effect of other nonopioid analgesics and antipyretics, not elsewhere classified, subsequent encounter: Secondary | ICD-10-CM | POA: Diagnosis not present

## 2016-09-20 DIAGNOSIS — Z881 Allergy status to other antibiotic agents status: Secondary | ICD-10-CM | POA: Diagnosis not present

## 2016-09-20 DIAGNOSIS — D539 Nutritional anemia, unspecified: Secondary | ICD-10-CM

## 2016-09-20 DIAGNOSIS — Z888 Allergy status to other drugs, medicaments and biological substances status: Secondary | ICD-10-CM | POA: Diagnosis not present

## 2016-09-20 NOTE — Progress Notes (Signed)
Hematology and Oncology Follow Up Visit  Lauren [name] [id number] November 16, 1940 76 y.o. 09/20/2016 10:03 AM   Principle Diagnosis: Encounter Diagnosis  Name Primary?  . 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  Macrocytosis was found to be secondary to increased reticulocytes and review of the peripheral blood film showed bite cells and polychromasia consistent with a hemolytic process.  Review of medications revealed that she was on Pyridium to suppress urinary tract symptoms.  This drug has a known association with methemoglobinemia and with exacerbations of hemolysis in persons with G6PD deficiency.  Further special laboratory revealed that she did have elevated methemoglobin but G6PD levels were normal during the crisis and at an interval when she was in a recovery phase after stopping the Pyridium.  Please see my previous office summary dated May 15 for additional details. She had a steady improvement in her hemoglobin from 8.3 on May 1 up to 11.6 by June 12.  Reticulocyte count down from peak of 7.9% to 0.8%. She is doing well at this time.  Jaundice has resolved.  She denies any hematuria.  Medications: reviewed  Allergies:  Allergies  Allergen Reactions  . Pyridium [Phenazopyridine Hcl] Other (See Comments)    Hemolytic anemia  . Sulfa Antibiotics     Potential allergy: oxidizing drug: methemoglobinemia/hemolysis on pyridium  . Ciprofloxacin Hcl     Sx's almost like a heart attack  . Esomeprazole Magnesium Nausea Only    Pain and nausea  . Macrodantin [Nitrofurantoin Macrocrystal] Other (See Comments)    GI Upset  . Pantoprazole Sodium Nausea Only    abd pain and nausea    Review of Systems: See interim history Remaining ROS negative:   Physical Exam: There were no vitals taken for this visit. Wt Readings from Last 3 Encounters:  07/13/16 111  lb 3.2 oz (50.4 kg)  06/03/15 119 lb (54 kg)  05/27/15 119 lb (54 kg)     General appearance: Well-nourished Asian woman HENNT: Pharynx no erythema, exudate, mass, or ulcer. No thyromegaly or thyroid nodules Lymph nodes: No cervical, supraclavicular, or axillary lymphadenopathy Breasts:  Lungs: Clear to auscultation, resonant to percussion throughout Heart: Regular rhythm, no murmur, no gallop, no rub, no click, no edema Abdomen: Soft, nontender, normal bowel sounds, no mass, no organomegaly Extremities: No edema, no calf tenderness Musculoskeletal: GU:  Vascular: Carotid pulses 2+, no bruits, Neurologic: Alert, oriented, PERRLA, , cranial nerves grossly normal, motor strength 5 over 5,  Skin: No rash or ecchymosis.  Resolution of mild jaundice.  Lab Results: CBC W/Diff    Component Value Date/Time   WBC 6.6 08/24/2016 0854   WBC 5.5 07/27/2016 0905   RBC 3.52 (L) 08/24/2016 0854   RBC 3.02 (L) 07/27/2016 0905   RBC 3.02 (L) 07/27/2016 0905   HGB 11.6 08/24/2016 0854   HCT 35.5 08/24/2016 0854   PLT 183 08/24/2016 0854   MCV 101 (H) 08/24/2016 0854   MCH 33.0 08/24/2016 0854   MCH 33.1 07/27/2016 0905   MCHC 32.7 08/24/2016 0854   MCHC 32.3 07/27/2016 0905   RDW 12.9 08/24/2016 0854   LYMPHSABS 2.0 08/24/2016 0854   MONOABS 0.4 07/27/2016 0905   EOSABS 0.1 08/24/2016 0854   BASOSABS 0.0 08/24/2016 0854     Chemistry      Component Value Date/Time   NA 137 07/27/2016 0905   K  4.0 07/27/2016 0905   CL 108 07/27/2016 0905   CO2 22 07/27/2016 0905   BUN 18 07/27/2016 0905   CREATININE 0.52 07/27/2016 0905   CREATININE 0.72 01/31/2015 1255      Component Value Date/Time   CALCIUM 9.5 07/27/2016 0905   CALCIUM 8.7 04/15/2011 1003   ALKPHOS 53 07/27/2016 0905   AST 21 07/27/2016 0905   ALT 19 07/27/2016 0905   BILITOT 0.9 07/27/2016 0905     Today's labs pending  Radiological Studies: No results found.  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  Resolved off drug. No evidence for G6PD deficiency or a hemoglobinopathy.   CC: Patient Care Team: Jani Gravel, MD as PCP - General (Internal Medicine)   Murriel Hopper, MD, [place]  Hematology-Oncology/Internal Medicine     7/9/201810:04 AM

## 2016-09-20 NOTE — Patient Instructions (Signed)
Return visit only as needed

## 2016-09-21 ENCOUNTER — Encounter: Payer: Self-pay | Admitting: *Deleted

## 2016-09-21 ENCOUNTER — Telehealth: Payer: Self-pay | Admitting: *Deleted

## 2016-09-21 LAB — RETICULOCYTES: RETIC CT PCT: 0.7 % (ref 0.6–2.6)

## 2016-09-21 LAB — CBC WITH DIFFERENTIAL/PLATELET
BASOS ABS: 0 10*3/uL (ref 0.0–0.2)
Basos: 1 %
EOS (ABSOLUTE): 0.1 10*3/uL (ref 0.0–0.4)
Eos: 1 %
HEMATOCRIT: 37.2 % (ref 34.0–46.6)
Hemoglobin: 11.9 g/dL (ref 11.1–15.9)
Immature Grans (Abs): 0 10*3/uL (ref 0.0–0.1)
Immature Granulocytes: 0 %
LYMPHS ABS: 2.3 10*3/uL (ref 0.7–3.1)
Lymphs: 34 %
MCH: 31.4 pg (ref 26.6–33.0)
MCHC: 32 g/dL (ref 31.5–35.7)
MCV: 98 fL — ABNORMAL HIGH (ref 79–97)
MONOS ABS: 0.5 10*3/uL (ref 0.1–0.9)
Monocytes: 7 %
NEUTROS ABS: 3.9 10*3/uL (ref 1.4–7.0)
Neutrophils: 57 %
Platelets: 181 10*3/uL (ref 150–379)
RBC: 3.79 x10E6/uL (ref 3.77–5.28)
RDW: 12.9 % (ref 12.3–15.4)
WBC: 6.8 10*3/uL (ref 3.4–10.8)

## 2016-09-21 LAB — LACTATE DEHYDROGENASE: LDH: 152 IU/L (ref 119–226)

## 2016-09-21 NOTE — Telephone Encounter (Signed)
Called [name], Scientist, research (physical sciences) - no answer; left message "blood count continues to improve. Hb now 11.9." per Dr Beryle Beams. And call for any questions.

## 2016-09-21 NOTE — Telephone Encounter (Signed)
-----   Message from Annia Belt, MD sent at 09/21/2016  8:50 AM EDT ----- Call pt friend who works here: blood count continues to improve. Hb now 11.9. Pt speaks only [place]

## 2016-09-21 NOTE — Congregational Nurse Program (Unsigned)
Congregational Nurse Program Note  Date of Encounter:09/20/2016  Past Medical History: Past Medical History:  Diagnosis Date  . Aortic atherosclerosis ([place])   . Diabetes mellitus   . Diverticulosis   . Dyspepsia   . Fecal incontinence   . Gastritis   . GERD (gastroesophageal reflux disease)   . 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 Hemorrhoids   . Hiatal hernia   . Hyperlipidemia   . Hypertension   . Hypothyroidism   . IBS (irritable bowel syndrome)   . Iron overload 07/13/2016  . 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 Osteoporosis   . UTI (lower urinary tract infection)   . Vitamin D deficiency     Encounter Details:     CNP Questionnaire - 09/20/16 1100      Patient Demographics   Is this a new or existing patient? Existing   Patient is considered a/an Immigrant   Race Asian     Patient Assistance   Location of Patient Assistance Not Applicable   Patient's financial/insurance status Medicare   Uninsured Patient ([facility]) No   Patient referred to apply for the following financial assistance Not Applicable   Food insecurities addressed Not Applicable   Transportation assistance Yes   Type of [place] medications No   Educational health offerings Navigating the healthcare system     Encounter Details   Primary purpose of visit Post PCP Visit;Navigating the Healthcare System  translate   Was an Emergency Department visit averted? Not Applicable   Does patient have a medical provider? Yes   Patient referred to Not Applicable   Was a mental health screening completed? (GAINS tool) No   Does patient have dental issues? No   Does patient have vision issues? No   Does your patient have an abnormal blood pressure today? Yes  At the clinic, 165/78   Since previous encounter, have you referred patient for abnormal blood pressure that resulted in a new diagnosis or  medication change? No   Does your patient have an abnormal blood glucose today? No   Since previous encounter, have you referred patient for abnormal blood glucose that resulted in a new diagnosis or medication change? No   Was there a life-saving intervention made? No         Clinical Intake - 09/20/16 1005      Pre-visit preparation   Pre-visit preparation completed --     Pain   Pain  No/denies pain     Nutrition Screen   BMI - recorded 22.78   Nutritional Status BMI of 19-24  Normal   Nutritional Risks None   Diabetes No     Functional Status   Activities of Daily Living Independent   Ambulation Independent   Medication Administration Independent   Home Management Independent     Risk/Barriers   Barriers to Care Management & Learning Language  [place].     Abuse/Neglect   Do you feel unsafe in your current relationship? No   Do you feel physically threatened by others? No   Anyone hurting you at home, work, or school? No   Unable to ask? No   Information provided on Community resources No     Patient Literacy   How often do you need to have someone help you when you read instructions, pamphlets, or other written materials from your doctor or pharmacy? 4 - Often  Language.  Investment banker, operational Needed? Yes   Teaching laboratory technician.   Interpreter Name Colletta [place]   Patient Declined Interpreter  No   Patient signed [facility] waiver No     Comments   Information entered by : Lanell Persons 09/20/16@ 1008AM     Visited Dr. Waymon Budge office to follow up. Labs were back to normal . No more f/u with Dr. Darnell Level.  Pt has appoint another labs and appointment with Dr, Maudie Mercury next week.

## 2016-09-27 DIAGNOSIS — D649 Anemia, unspecified: Secondary | ICD-10-CM | POA: Diagnosis not present

## 2016-09-27 DIAGNOSIS — I10 Essential (primary) hypertension: Secondary | ICD-10-CM | POA: Diagnosis not present

## 2016-09-27 DIAGNOSIS — N390 Urinary tract infection, site not specified: Secondary | ICD-10-CM | POA: Diagnosis not present

## 2016-09-27 DIAGNOSIS — M810 Age-related osteoporosis without current pathological fracture: Secondary | ICD-10-CM | POA: Diagnosis not present

## 2016-09-27 DIAGNOSIS — E1165 Type 2 diabetes mellitus with hyperglycemia: Secondary | ICD-10-CM | POA: Diagnosis not present

## 2016-09-28 ENCOUNTER — Encounter: Payer: Self-pay | Admitting: *Deleted

## 2016-09-28 NOTE — Congregational Nurse Program (Unsigned)
Congregational Nurse Program Note  Date of Encounter: 09/27/2016 Past Medical History: Past Medical History:  Diagnosis Date  . Aortic atherosclerosis ([place])   . Diabetes mellitus   . Diverticulosis   . Dyspepsia   . Fecal incontinence   . Gastritis   . GERD (gastroesophageal reflux disease)   . 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 Hemorrhoids   . Hiatal hernia   . Hyperlipidemia   . Hypertension   . Hypothyroidism   . IBS (irritable bowel syndrome)   . Iron overload 07/13/2016  . 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 Osteoporosis   . UTI (lower urinary tract infection)   . Vitamin D deficiency     Encounter Details:     CNP Questionnaire - 09/27/16 1000      Patient Demographics   Is this a new or existing patient? Existing   Patient is considered a/an Immigrant   Race Asian     Patient Assistance   Location of Patient Assistance Not Applicable  Dr's office   Patient's financial/insurance status Medicare   Uninsured Patient ([facility]) No   Patient referred to apply for the following financial assistance Not Applicable   Food insecurities addressed Not Applicable   Transportation assistance No   Type of [place] medications No   Educational health offerings Navigating the healthcare system  translate     Encounter Details   Primary purpose of visit Other  lab test and bone scan   Was an Emergency Department visit averted? Not Applicable   Does patient have a medical provider? Yes   Patient referred to Not Applicable   Was a mental health screening completed? (GAINS tool) No   Does patient have dental issues? No   Does patient have vision issues? No   Does your patient have an abnormal blood pressure today? No   Since previous encounter, have you referred patient for abnormal blood pressure that resulted in a new diagnosis or medication change? No   Does  your patient have an abnormal blood glucose today? No   Since previous encounter, have you referred patient for abnormal blood glucose that resulted in a new diagnosis or medication change? No   Was there a life-saving intervention made? No         Clinical Intake - 09/20/16 1005      Pre-visit preparation   Pre-visit preparation completed --     Pain   Pain  No/denies pain     Nutrition Screen   BMI - recorded 22.78   Nutritional Status BMI of 19-24  Normal   Nutritional Risks None   Diabetes No     Functional Status   Activities of Daily Living Independent   Ambulation Independent   Medication Administration Independent   Home Management Independent     Risk/Barriers   Barriers to Care Management & Learning Language  [place].     Abuse/Neglect   Do you feel unsafe in your current relationship? No   Do you feel physically threatened by others? No   Anyone hurting you at home, work, or school? No   Unable to ask? No   Information provided on Community resources No     Patient Literacy   How often do you need to have someone help you when you read instructions, pamphlets, or other written materials from your doctor or pharmacy? 4 - Often  Language.  Investment banker, operational Needed? Yes   Teaching laboratory technician.   Interpreter Name Colletta [place]   Patient Declined Interpreter  No   Patient signed [facility] waiver No     Comments   Information entered by : Lanell Persons 09/20/16@ 1008AM     Labs and boe scan done. Will visit Dr. Maudie Mercury on 09/29/2016.

## 2016-09-29 DIAGNOSIS — E1165 Type 2 diabetes mellitus with hyperglycemia: Secondary | ICD-10-CM | POA: Diagnosis not present

## 2016-09-29 DIAGNOSIS — D599 Acquired hemolytic anemia, unspecified: Secondary | ICD-10-CM | POA: Diagnosis not present

## 2016-09-29 DIAGNOSIS — I10 Essential (primary) hypertension: Secondary | ICD-10-CM | POA: Diagnosis not present

## 2016-09-29 DIAGNOSIS — E785 Hyperlipidemia, unspecified: Secondary | ICD-10-CM | POA: Diagnosis not present

## 2016-10-06 ENCOUNTER — Encounter: Payer: Self-pay | Admitting: *Deleted

## 2016-10-06 NOTE — Addendum Note (Signed)
Addended by: Orson Gear on: 10/06/2016 03:17 PM   Modules accepted: Orders

## 2016-10-06 NOTE — Congregational Nurse Program (Unsigned)
Congregational Nurse Program Note  Date of Encounter: 10/05/2016 Past Medical History: Past Medical History:  Diagnosis Date  . Aortic atherosclerosis ([place])   . Diabetes mellitus   . Diverticulosis   . Dyspepsia   . Fecal incontinence   . Gastritis   . GERD (gastroesophageal reflux disease)   . 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 Hemorrhoids   . Hiatal hernia   . Hyperlipidemia   . Hypertension   . Hypothyroidism   . IBS (irritable bowel syndrome)   . Iron overload 07/13/2016  . 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 Osteoporosis   . UTI (lower urinary tract infection)   . Vitamin D deficiency     Encounter Details:     CNP Questionnaire - 10/05/16 0800      Patient Demographics   Is this a new or existing patient? Existing   Patient is considered a/an Immigrant   Race Asian     Patient Assistance   Location of Patient Assistance Not Applicable  [facility]   Patient's financial/insurance status Medicare   Uninsured Patient ([facility]) No   Patient referred to apply for the following financial assistance Not Applicable   Food insecurities addressed Not Applicable   Transportation assistance No   Assistance securing medications No   Educational health offerings Other;Chronic disease  made appoint with mammogram, finished antibiotic for UTI     Encounter Details   Primary purpose of visit Other   Was an Emergency Department visit averted? Not Applicable   Does patient have a medical provider? Yes   Patient referred to Other  Dr.Ono office, mammogram   Was a mental health screening completed? (GAINS tool) No   Does patient have dental issues? No   Does patient have vision issues? No   Does your patient have an abnormal blood pressure today? No   Since previous encounter, have you referred patient for abnormal blood pressure that resulted in a new diagnosis or medication change? No   Does your  patient have an abnormal blood glucose today? No   Since previous encounter, have you referred patient for abnormal blood glucose that resulted in a new diagnosis or medication change? No   Was there a life-saving intervention made? No         Clinical Intake - 09/20/16 1005      Pre-visit preparation   Pre-visit preparation completed --     Pain   Pain  No/denies pain     Nutrition Screen   BMI - recorded 22.78   Nutritional Status BMI of 19-24  Normal   Nutritional Risks None   Diabetes No     Functional Status   Activities of Daily Living Independent   Ambulation Independent   Medication Administration Independent   Home Management Independent     Risk/Barriers   Barriers to Care Management & Learning Language  [place].     Abuse/Neglect   Do you feel unsafe in your current relationship? No   Do you feel physically threatened by others? No   Anyone hurting you at home, work, or school? No   Unable to ask? No   Information provided on Community resources No     Patient Literacy   How often do you need to have someone help you when you read instructions, pamphlets, or other written materials from your doctor or pharmacy? 4 - Often  Language.     Programmer, multimedia  Interpreter Needed? Yes   Teaching laboratory technician.   Interpreter Name Colletta [place]   Patient Declined Interpreter  No   Patient signed [facility] waiver No     Comments   Information entered by : Lanell Persons 09/20/16@ 1008AM     Pt stated antibiotic finished, still remains dull pain and stinging at times. Need appointment to mammogram. Called [facility] to made an appointment and lest message to Dr. Julianne Rice office regarding pt's concern, and f/u appointment.

## 2016-10-12 DIAGNOSIS — Z1231 Encounter for screening mammogram for malignant neoplasm of breast: Secondary | ICD-10-CM | POA: Diagnosis not present

## 2016-11-19 ENCOUNTER — Encounter: Payer: Self-pay | Admitting: *Deleted

## 2016-11-19 NOTE — Congregational Nurse Program (Unsigned)
Congregational Nurse Program Note  Date of Encounter: 11/19/2016  Past Medical History: Past Medical History:  Diagnosis Date  . Aortic atherosclerosis ([place])   . Diabetes mellitus   . Diverticulosis   . Dyspepsia   . Fecal incontinence   . Gastritis   . GERD (gastroesophageal reflux disease)   . 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 Hemorrhoids   . Hiatal hernia   . Hyperlipidemia   . Hypertension   . Hypothyroidism   . IBS (irritable bowel syndrome)   . Iron overload 07/13/2016  . 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 Osteoporosis   . UTI (lower urinary tract infection)   . Vitamin D deficiency     Encounter Details:     CNP Questionnaire - 11/19/16 1442      Patient Demographics   Is this a new or existing patient? Existing   Patient is considered a/an Immigrant   Race Asian     Patient Assistance   Location of Patient Assistance Not Applicable   Patient's financial/insurance status Medicare   Uninsured Patient ([facility]) No   Patient referred to apply for the following financial assistance Not Applicable   Food insecurities addressed Not Applicable   Transportation assistance No   Assistance securing medications Yes   Type of Assistance Other  meds order   Educational health offerings Spiritual care     Encounter Details   Primary purpose of visit Other;Spiritual Care/Support Visit   Was an Emergency Department visit averted? Not Applicable   Does patient have a medical provider? Yes   Patient referred to Not Applicable   Was a mental health screening completed? (GAINS tool) No   Does patient have dental issues? No   Does patient have vision issues? No   Does your patient have an abnormal blood pressure today? No   Since previous encounter, have you referred patient for abnormal blood pressure that resulted in a new diagnosis or medication change? No   Does your patient have an  abnormal blood glucose today? No   Since previous encounter, have you referred patient for abnormal blood glucose that resulted in a new diagnosis or medication change? No   Was there a life-saving intervention made? No    pt was planed to go to [place] visit her children 3weeks, need more medicines. Ordered through online,  Also she worried regarding UTI . Told her need copy of allergic meds list and carry with her.

## 2016-12-04 ENCOUNTER — Encounter: Payer: Self-pay | Admitting: *Deleted

## 2016-12-04 DIAGNOSIS — Z23 Encounter for immunization: Secondary | ICD-10-CM

## 2017-01-01 ENCOUNTER — Encounter: Payer: Self-pay | Admitting: *Deleted

## 2017-01-01 NOTE — Congregational Nurse Program (Unsigned)
Congregational Nurse Program Note  Date of Encounter: 12/29/2016 Past Medical History: Past Medical History:  Diagnosis Date  . Aortic atherosclerosis ([place])   . Diabetes mellitus   . Diverticulosis   . Dyspepsia   . Fecal incontinence   . Gastritis   . GERD (gastroesophageal reflux disease)   . 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 Hemorrhoids   . Hiatal hernia   . Hyperlipidemia   . Hypertension   . Hypothyroidism   . IBS (irritable bowel syndrome)   . Iron overload 07/13/2016  . 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 Osteoporosis   . UTI (lower urinary tract infection)   . Vitamin D deficiency     Encounter Details:     CNP Questionnaire - 12/29/16 2200      Patient Demographics   Is this a new or existing patient? Existing   Patient is considered a/an Immigrant   Race Asian     Patient Assistance   Location of Patient Assistance Not Applicable   Patient's financial/insurance status Medicare   Uninsured Patient ([facility]) No   Patient referred to apply for the following financial assistance Not Applicable   Food insecurities addressed Not Applicable   Transportation assistance No   Assistance securing medications No   Educational health offerings Not Applicable     Encounter Details   Primary purpose of visit Other  out of glucose strip   Was an Emergency Department visit averted? Not Applicable   Does patient have a medical provider? Yes   Patient referred to Not Applicable   Was a mental health screening completed? (GAINS tool) No   Does patient have dental issues? No   Does patient have vision issues? No   Does your patient have an abnormal blood pressure today? No  not checked   Since previous encounter, have you referred patient for abnormal blood pressure that resulted in a new diagnosis or medication change? No   Does your patient have an abnormal blood glucose today? No   Since  previous encounter, have you referred patient for abnormal blood glucose that resulted in a new diagnosis or medication change? No   Was there a life-saving intervention made? No     pt stated no glucose strips. Ordered strips and share strips. Health Condition was good.  Reminded her labs on 01/17/2017 & Dr's appointment on 01/24/2017.

## 2017-01-09 ENCOUNTER — Encounter: Payer: Self-pay | Admitting: *Deleted

## 2017-01-09 NOTE — Congregational Nurse Program (Unsigned)
Congregational Nurse Program Note  Date of Encounter: 01/09/2017  Past Medical History: Past Medical History:  Diagnosis Date  . Aortic atherosclerosis ([place])   . Diabetes mellitus   . Diverticulosis   . Dyspepsia   . Fecal incontinence   . Gastritis   . GERD (gastroesophageal reflux disease)   . 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 Hemorrhoids   . Hiatal hernia   . Hyperlipidemia   . Hypertension   . Hypothyroidism   . IBS (irritable bowel syndrome)   . Iron overload 07/13/2016  . 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 Osteoporosis   . UTI (lower urinary tract infection)   . Vitamin D deficiency     Encounter Details:     CNP Questionnaire - 01/09/17 2057      Patient Demographics   Is this a new or existing patient? Existing   Patient is considered a/an Immigrant   Race Asian     Patient Assistance   Location of Patient Assistance Not Applicable   Patient's financial/insurance status Medicare   Uninsured Patient ([facility]) No   Patient referred to apply for the following financial assistance Not Applicable   Food insecurities addressed Not Applicable   Transportation assistance No   Assistance securing medications No   Educational health offerings Not Applicable     Encounter Details   Primary purpose of visit Other  need check mails and medicine order   Was an Emergency Department visit averted? Not Applicable   Does patient have a medical provider? Yes   Patient referred to Not Applicable   Was a mental health screening completed? (GAINS tool) No   Does patient have dental issues? No   Does patient have vision issues? No  ware glasses   Does your patient have an abnormal blood pressure today? No   Since previous encounter, have you referred patient for abnormal blood pressure that resulted in a new diagnosis or medication change? No   Does your patient have an abnormal blood glucose  today? No   Since previous encounter, have you referred patient for abnormal blood glucose that resulted in a new diagnosis or medication change? No   Was there a life-saving intervention made? No     pt stopped by office to check her mail and one dose   left the" fosamax". Told her already ordered by [facility] ,will be arrive 3-4days . No other c/o.voiced. Reminded her lab on 11/5 and Dr's visit 01/24/2017.

## 2017-01-17 DIAGNOSIS — D649 Anemia, unspecified: Secondary | ICD-10-CM | POA: Diagnosis not present

## 2017-01-17 DIAGNOSIS — I10 Essential (primary) hypertension: Secondary | ICD-10-CM | POA: Diagnosis not present

## 2017-01-17 DIAGNOSIS — E785 Hyperlipidemia, unspecified: Secondary | ICD-10-CM | POA: Diagnosis not present

## 2017-01-17 DIAGNOSIS — E1165 Type 2 diabetes mellitus with hyperglycemia: Secondary | ICD-10-CM | POA: Diagnosis not present

## 2017-01-18 ENCOUNTER — Telehealth: Payer: Self-pay | Admitting: *Deleted

## 2017-01-24 ENCOUNTER — Other Ambulatory Visit: Payer: Self-pay | Admitting: Internal Medicine

## 2017-01-24 ENCOUNTER — Encounter: Payer: Self-pay | Admitting: *Deleted

## 2017-01-24 DIAGNOSIS — I10 Essential (primary) hypertension: Secondary | ICD-10-CM | POA: Diagnosis not present

## 2017-01-24 DIAGNOSIS — E785 Hyperlipidemia, unspecified: Secondary | ICD-10-CM | POA: Diagnosis not present

## 2017-01-24 DIAGNOSIS — E1165 Type 2 diabetes mellitus with hyperglycemia: Secondary | ICD-10-CM | POA: Diagnosis not present

## 2017-01-24 DIAGNOSIS — R42 Dizziness and giddiness: Secondary | ICD-10-CM

## 2017-01-24 DIAGNOSIS — D649 Anemia, unspecified: Secondary | ICD-10-CM | POA: Diagnosis not present

## 2017-01-26 NOTE — Congregational Nurse Program (Signed)
Congregational Nurse Program Note  Date of Encounter: 01/24/2017  Past Medical History: Past Medical History:  Diagnosis Date  . Aortic atherosclerosis ([place])   . Diabetes mellitus   . Diverticulosis   . Dyspepsia   . Fecal incontinence   . Gastritis   . GERD (gastroesophageal reflux disease)   . 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 Hemorrhoids   . Hiatal hernia   . Hyperlipidemia   . Hypertension   . Hypothyroidism   . IBS (irritable bowel syndrome)   . Iron overload 07/13/2016  . 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 Osteoporosis   . UTI (lower urinary tract infection)   . Vitamin D deficiency     Encounter Details: CNP Questionnaire - 01/24/17 0911      Questionnaire   Patient Status  Immigrant    Race  Asian    Location Patient Served At  Not Applicable    Insurance  Medicare    Uninsured  Not Applicable    Food  No food insecurities    Housing/Utilities  No permanent housing    Transportation  Yes, need transportation assistance    Interpersonal Safety  Yes, feel physically and emotionally safe where you currently live    Medication  No medication insecurities    Medical Provider  Yes    Referrals  Not Applicable    ED Visit Averted  Not Applicable    Life-Saving Intervention Made  Not Applicable     f/u with Lab , c/o dizziness and HA at times, Dr. Vonzella Nipple MRI brain and doppler bilat. Carotid, echo . Made appointment with MRI @ 01/30/2017 by phone . She need help translate and ride. will f/u.

## 2017-01-29 ENCOUNTER — Telehealth: Payer: Self-pay | Admitting: *Deleted

## 2017-01-30 ENCOUNTER — Encounter: Payer: Self-pay | Admitting: *Deleted

## 2017-01-30 ENCOUNTER — Ambulatory Visit
Admission: RE | Admit: 2017-01-30 | Discharge: 2017-01-30 | Disposition: A | Discharge status: Home / Self Care | Payer: Medicare Other | Source: Ambulatory Visit | Attending: Internal Medicine | Admitting: Internal Medicine

## 2017-01-30 DIAGNOSIS — R42 Dizziness and giddiness: Secondary | ICD-10-CM

## 2017-01-30 MED ORDER — GADOBENATE DIMEGLUMINE 529 MG/ML IV SOLN
10.0000 mL | Freq: Once | INTRAVENOUS | Status: AC | PRN
  Administered 2017-01-30: 10 mL via INTRAVENOUS

## 2017-01-30 NOTE — Congregational Nurse Program (Unsigned)
Congregational Nurse Program Note  Date of Encounter: 01/30/2017  Past Medical History: Past Medical History:  Diagnosis Date  . Aortic atherosclerosis ([place])   . Diabetes mellitus   . Diverticulosis   . Dyspepsia   . Fecal incontinence   . Gastritis   . GERD (gastroesophageal reflux disease)   . 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 Hemorrhoids   . Hiatal hernia   . Hyperlipidemia   . Hypertension   . Hypothyroidism   . IBS (irritable bowel syndrome)   . Iron overload 07/13/2016  . 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 Osteoporosis   . UTI (lower urinary tract infection)   . Vitamin D deficiency     Encounter Details: CNP Questionnaire - 01/30/17 1904      Questionnaire   Patient Status  Immigrant    Race  Asian    Location Patient Served At  Not Applicable [facility]   [facility]   [facility]    Uninsured  Not Applicable    Food  No food insecurities    Housing/Utilities  No permanent housing    Transportation  Yes, need transportation assistance    Interpersonal Safety  Yes, feel physically and emotionally safe where you currently live    Medication  No medication insecurities    Medical Provider  Yes    Referrals  Not Applicable    ED Visit Averted  Not Applicable    Life-Saving Intervention Made  Not Applicable      need ride and translated for MRI brain. After MRI little dizzy . Sat in the chair, and get better. Pt said she was moving slow due to dizziness.  Reminded her  regarding another appointment for carotid ultrasound and echo on 02/09/2017. Educated for safety about fall precaution. Slow move, sit at bed then get up when go to bathroom during night., turn the light on.spending 3 hrs today.

## 2017-02-10 ENCOUNTER — Encounter: Payer: Self-pay | Admitting: *Deleted

## 2017-02-10 DIAGNOSIS — R42 Dizziness and giddiness: Secondary | ICD-10-CM | POA: Diagnosis not present

## 2017-02-10 DIAGNOSIS — I6523 Occlusion and stenosis of bilateral carotid arteries: Secondary | ICD-10-CM | POA: Diagnosis not present

## 2017-02-10 DIAGNOSIS — I119 Hypertensive heart disease without heart failure: Secondary | ICD-10-CM | POA: Diagnosis not present

## 2017-02-13 NOTE — Congregational Nurse Program (Unsigned)
Congregational Nurse Program Note  Date of Encounter: 02/10/2017  Past Medical History: Past Medical History:  Diagnosis Date  . Allergy   . Aortic atherosclerosis ([place])   . Diabetes mellitus   . Diverticulosis   . Dyspepsia   . Fecal incontinence   . Gastritis   . GERD (gastroesophageal reflux disease)   . 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 Hemorrhoids   . Hiatal hernia   . Hyperlipidemia   . Hypertension   . Hypothyroidism   . IBS (irritable bowel syndrome)   . Iron overload 07/13/2016  . 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 Osteoporosis   . UTI (lower urinary tract infection)   . Vitamin D deficiency     Encounter Details: CNP Questionnaire - 02/10/17 0930      Questionnaire   Patient Status  Immigrant    Race  Asian    Location Patient Served At  Not Applicable Dr's office to check echo cardiac   Dr's office to check echo cardiac   Insurance  Medicare    Uninsured  Not Applicable    Food  No food insecurities    Housing/Utilities  No permanent housing    Transportation  Yes, need transportation assistance    Interpersonal Safety  Yes, feel physically and emotionally safe where you currently live    Medication  No medication insecurities    Medical Provider  Yes    Referrals  Not Applicable    ED Visit Averted  Not Applicable    Life-Saving Intervention Made  Not Applicable      pt c/o dizziness, Dr. Maudie Mercury ordered carotid doppler bilateral, and echo cardiogram. Be with her at the test for translate and support emotional. After test done, reminded her next Dr's appointment is 02/18/2017 with Dr. Maudie Mercury.

## 2017-02-18 ENCOUNTER — Encounter: Payer: Self-pay | Admitting: *Deleted

## 2017-02-18 DIAGNOSIS — R42 Dizziness and giddiness: Secondary | ICD-10-CM | POA: Diagnosis not present

## 2017-02-18 DIAGNOSIS — I10 Essential (primary) hypertension: Secondary | ICD-10-CM | POA: Diagnosis not present

## 2017-02-18 DIAGNOSIS — E1165 Type 2 diabetes mellitus with hyperglycemia: Secondary | ICD-10-CM | POA: Diagnosis not present

## 2017-02-18 DIAGNOSIS — K219 Gastro-esophageal reflux disease without esophagitis: Secondary | ICD-10-CM | POA: Diagnosis not present

## 2017-02-18 NOTE — Congregational Nurse Program (Signed)
Congregational Nurse Program Note  Date of Encounter: 02/18/2017  Past Medical History: Past Medical History:  Diagnosis Date  . Allergy   . Aortic atherosclerosis ([place])   . Diabetes mellitus   . Diverticulosis   . Dyspepsia   . Fecal incontinence   . Gastritis   . GERD (gastroesophageal reflux disease)   . 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 Hemorrhoids   . Hiatal hernia   . Hyperlipidemia   . Hypertension   . Hypothyroidism   . IBS (irritable bowel syndrome)   . Iron overload 07/13/2016  . 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 Osteoporosis   . UTI (lower urinary tract infection)   . Vitamin D deficiency     Encounter Details: CNP Questionnaire - 02/18/17 1300      Questionnaire   Patient Status  Immigrant    Race  Asian    Location Patient Served At  Not Applicable clinic   clinic   Insurance  Medicare    Uninsured  Not Applicable    Food  No food insecurities    Housing/Utilities  No permanent housing    Transportation  Yes, need transportation assistance    Interpersonal Safety  Yes, feel physically and emotionally safe where you currently live    Medication  No medication insecurities    Medical Provider  Yes    Referrals  Not Applicable    ED Visit Averted  Not Applicable    Life-Saving Intervention Made  Not Applicable       F/u with Dr. Maudie Mercury regarding MRI and Carotid doppler and echo cardiogram that was WNL. dizziness is getting better now. Also mentioned about stomach pain, dr said she can increase priloseg  3x/day or 2tab q am 1 tan q hs or 1qam, 2q hs. Since pt is getting better dizziness, will f/u 6 month.

## 2017-02-21 ENCOUNTER — Telehealth: Payer: Self-pay | Admitting: *Deleted

## 2017-03-12 IMAGING — CT CT HEAD W/O CM
2 series · 16 of 30 positions shown, 20 images · non-contrast
Comparison: 12/30/2003

CLINICAL DATA: Severe headaches for 3 weeks.

EXAM:
CT HEAD WITHOUT CONTRAST
TECHNIQUE: Contiguous axial images were obtained from the base of the skull
through the vertex without intravenous contrast.

[Series 3: head bone · axial · 0.49mm/px · z∈[+24,+64]mm · 3 of 28 slices shown]
[im 2/28  bone]
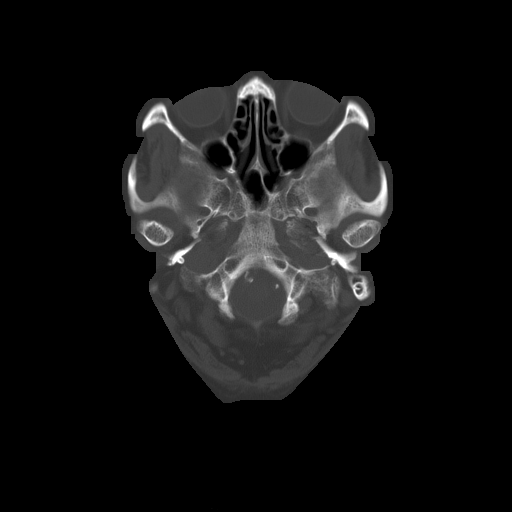
[im 6/28  bone]
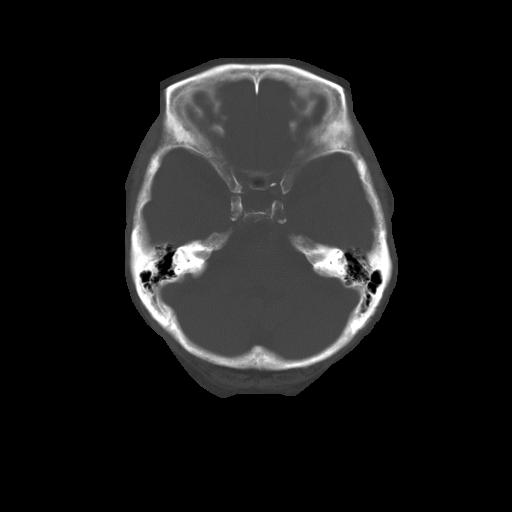
[im 10/28  bone]
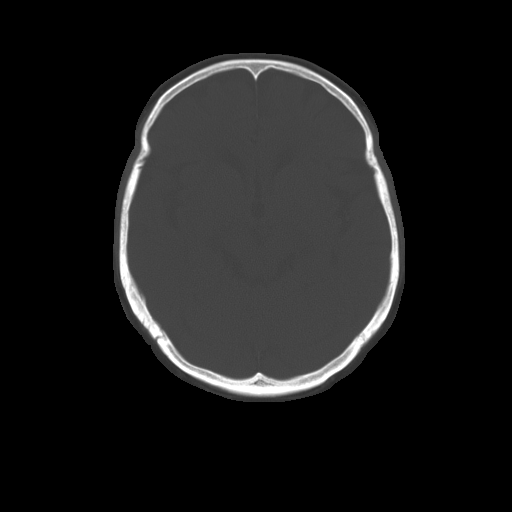

[Series 32: 3d filtered head w/o · axial · non-contrast · 0.49mm/px · z∈[+24,+146]mm · 13 of 28 slices shown, 17 images]
[im 2/28  brain]
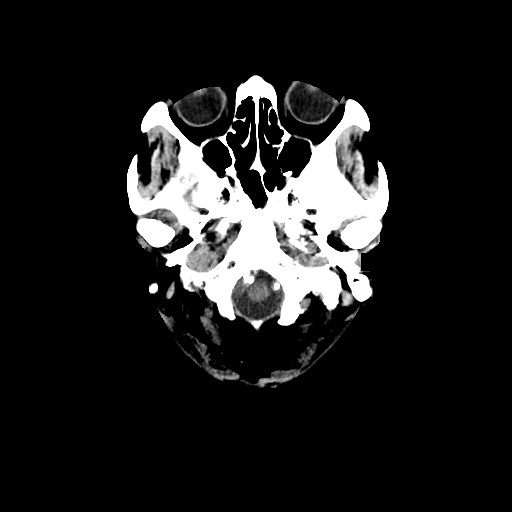
[im 2/28  bone]
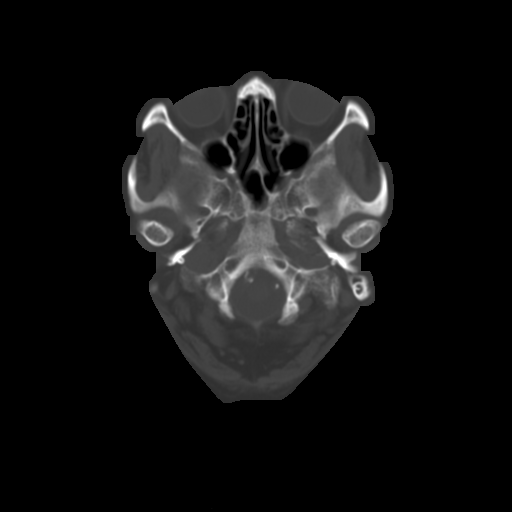
[im 4/28  brain]
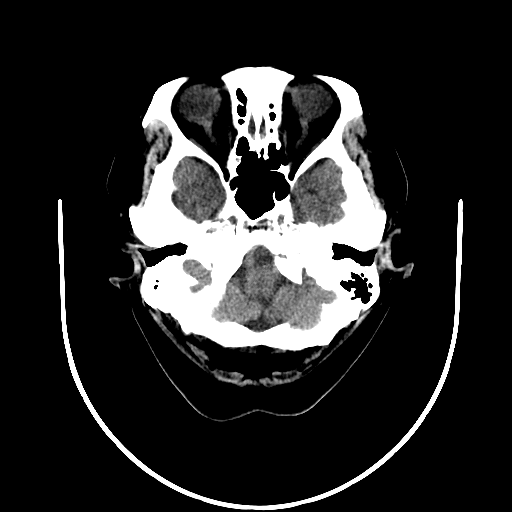
[im 6/28  brain]
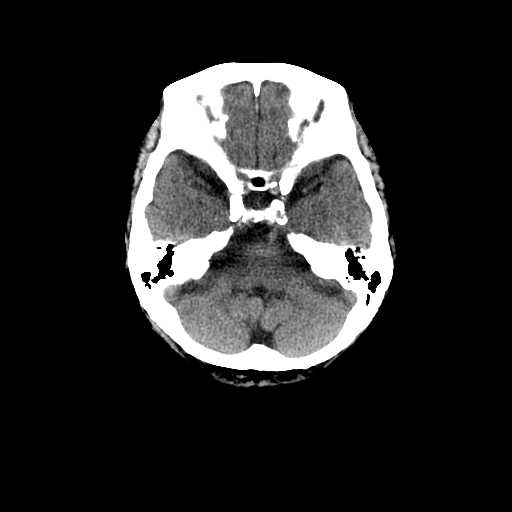
[im 8/28  brain]
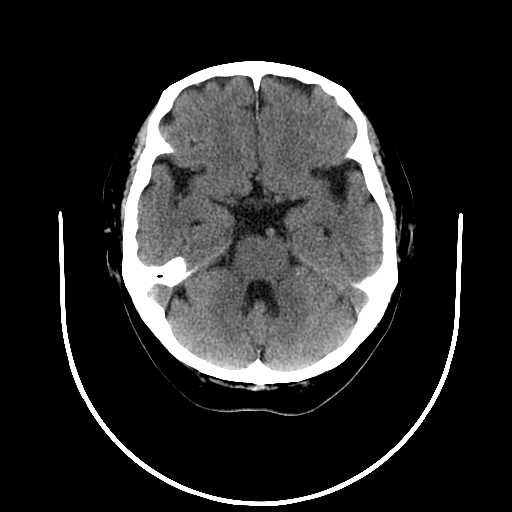
[im 10/28  brain]
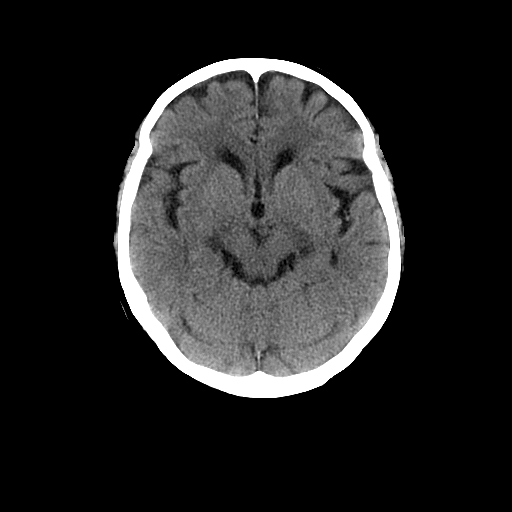
[im 10/28  bone]
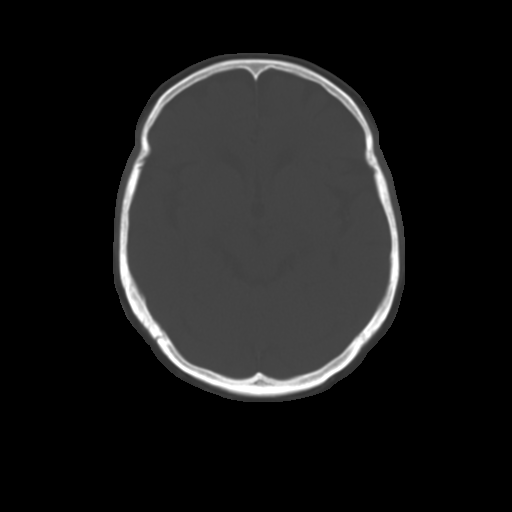
[im 12/28  brain]
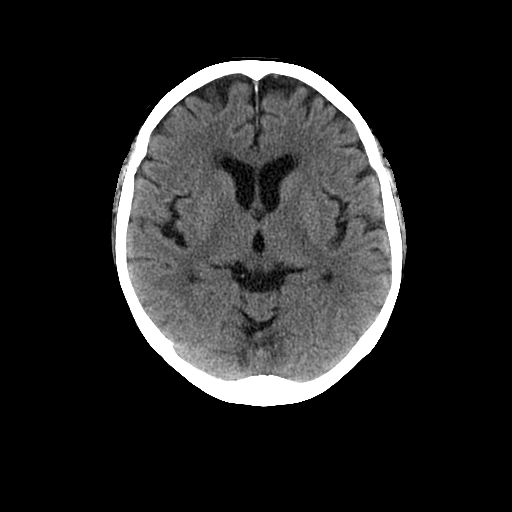
[im 14/28  brain]
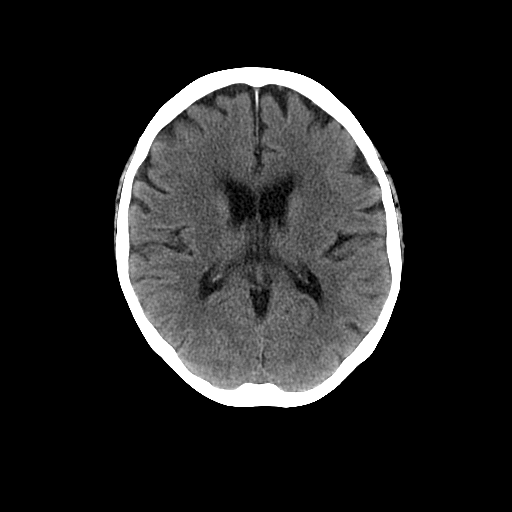
[im 16/28  brain]
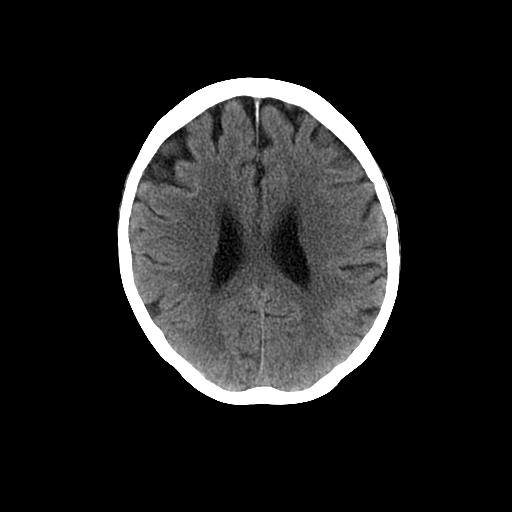
[im 18/28  brain]
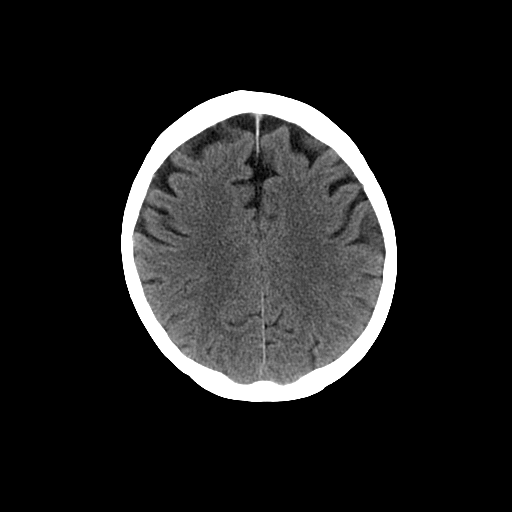
[im 18/28  bone]
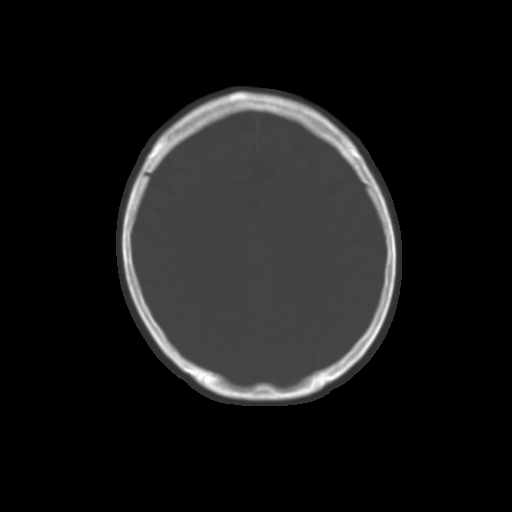
[im 20/28  brain]
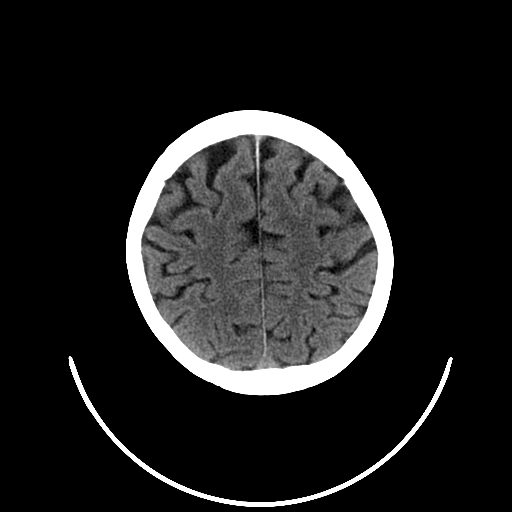
[im 22/28  brain]
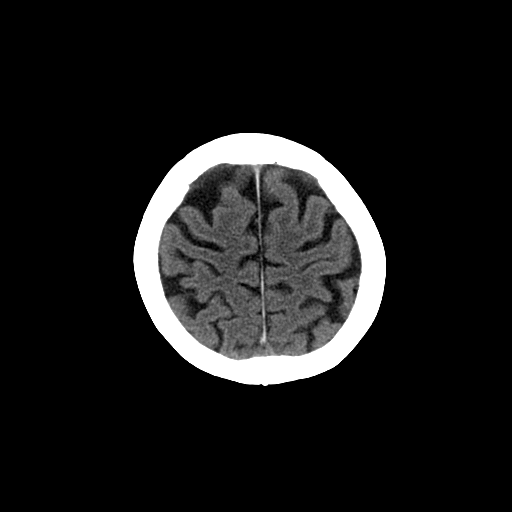
[im 24/28  brain]
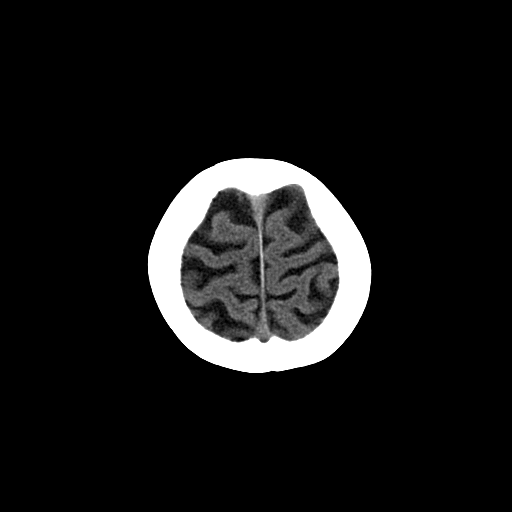
[im 26/28  brain]
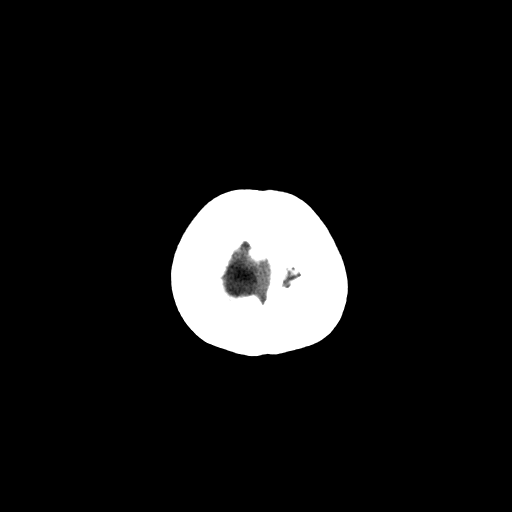
[im 26/28  bone]
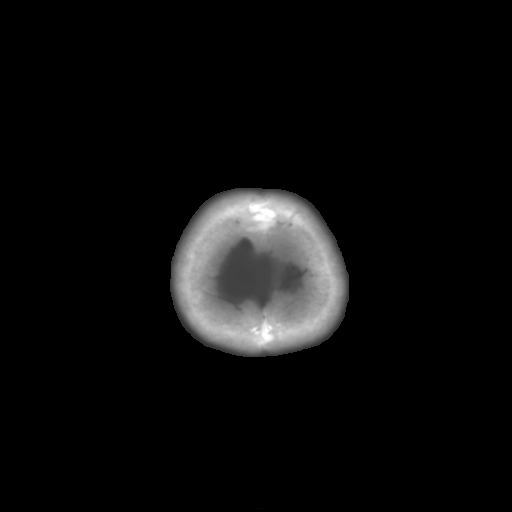

[16 of 30 positions shown; findings below may reference images not displayed]

FINDINGS: There is no evidence of intracranial hemorrhage, brain edema, or
other signs of acute infarction. There is no evidence of
intracranial mass lesion or mass effect. No abnormal extraaxial
fluid collections are identified.

Mild cerebral atrophy and chronic small vessel disease noted. No
evidence of hydrocephalus. No skull abnormality identified.
IMPRESSION: No acute intracranial findings.

Mild cerebral atrophy and chronic small vessel disease.

## 2017-03-22 DIAGNOSIS — H903 Sensorineural hearing loss, bilateral: Secondary | ICD-10-CM | POA: Diagnosis not present

## 2017-05-16 ENCOUNTER — Encounter: Payer: Self-pay | Admitting: *Deleted

## 2017-05-16 DIAGNOSIS — E119 Type 2 diabetes mellitus without complications: Secondary | ICD-10-CM | POA: Diagnosis not present

## 2017-05-16 DIAGNOSIS — H353131 Nonexudative age-related macular degeneration, bilateral, early dry stage: Secondary | ICD-10-CM | POA: Diagnosis not present

## 2017-05-16 DIAGNOSIS — H40013 Open angle with borderline findings, low risk, bilateral: Secondary | ICD-10-CM | POA: Diagnosis not present

## 2017-05-16 DIAGNOSIS — H40033 Anatomical narrow angle, bilateral: Secondary | ICD-10-CM | POA: Diagnosis not present

## 2017-05-16 NOTE — Congregational Nurse Program (Unsigned)
Congregational Nurse Program Note  Date of Encounter: 05/16/2017  Past Medical History: Past Medical History:  Diagnosis Date  . Allergy   . Aortic atherosclerosis ([place])   . Diabetes mellitus   . Diverticulosis   . Dyspepsia   . Fecal incontinence   . Gastritis   . GERD (gastroesophageal reflux disease)   . 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 Hemorrhoids   . Hiatal hernia   . Hyperlipidemia   . Hypertension   . Hypothyroidism   . IBS (irritable bowel syndrome)   . Iron overload 07/13/2016  . 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 Osteoporosis   . UTI (lower urinary tract infection)   . Vitamin D deficiency     Encounter Details: CNP Questionnaire - 05/16/17 0800      Questionnaire   Patient Status  Immigrant    Race  Asian    Location Patient Served At  Not Applicable eye Dr's office   eye Dr's office   Insurance  Medicare    Uninsured  Not Applicable    Food  No food insecurities    Housing/Utilities  No permanent housing    Transportation  Yes, need transportation assistance    Interpersonal Safety  Yes, feel physically and emotionally safe where you currently live    Medication  No medication insecurities    Medical Provider  Yes    Referrals  Not Applicable    ED Visit Averted  Not Applicable    Life-Saving Intervention Made  Not Applicable       Annual eye exam. Mild cataract, mild glaucoma,but not for treatment. Need 6 moth visit for glaucoma test. Bifocal eye glasses prescription received. Will wait til her daughter visit her around April.

## 2017-05-29 ENCOUNTER — Encounter: Payer: Self-pay | Admitting: *Deleted

## 2017-05-29 DIAGNOSIS — Z139 Encounter for screening, unspecified: Secondary | ICD-10-CM

## 2017-05-29 LAB — GLUCOSE, POCT (MANUAL RESULT ENTRY): POC Glucose: 132 mg/dl — AB (ref 70–99)

## 2017-05-30 NOTE — Congregational Nurse Program (Unsigned)
Congregational Nurse Program Note  Date of Encounter: 05/29/2017  Past Medical History: Past Medical History:  Diagnosis Date  . Allergy   . Aortic atherosclerosis ([place])   . Diabetes mellitus   . Diverticulosis   . Dyspepsia   . Fecal incontinence   . Gastritis   . GERD (gastroesophageal reflux disease)   . 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 Hemorrhoids   . Hiatal hernia   . Hyperlipidemia   . Hypertension   . Hypothyroidism   . IBS (irritable bowel syndrome)   . Iron overload 07/13/2016  . 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 Osteoporosis   . UTI (lower urinary tract infection)   . Vitamin D deficiency     Encounter Details: CNP Questionnaire - 05/29/17 1100      Questionnaire   Patient Status  Immigrant    Race  Asian    Location Patient Served At  Not Applicable [place]  Medicare    Uninsured  Not Applicable    Food  No food insecurities    Housing/Utilities  No permanent housing    Transportation  No transportation needs    Interpersonal Safety  Yes, feel physically and emotionally safe where you currently live    Medication  No medication insecurities Need help to order medicine via online   Need help to order medicine via online   Medical Provider  Yes    Referrals  Not Applicable    ED Visit Averted  Not Applicable    Life-Saving Intervention Made  Not Applicable      pt. Stopped by office for need help to refill order medicines. Ordered  6 medicine via online. Said plan to visit son who lives in [place] at end of April. Will f/u if medicine arrive.

## 2017-07-17 ENCOUNTER — Encounter: Payer: Self-pay | Admitting: *Deleted

## 2017-07-21 ENCOUNTER — Telehealth: Payer: Self-pay | Admitting: *Deleted

## 2017-07-21 NOTE — Congregational Nurse Program (Unsigned)
Congregational Nurse Program Note  Date of Encounter: 07/17/2017  Past Medical History: Past Medical History:  Diagnosis Date  . Allergy   . Aortic atherosclerosis ([place])   . Diabetes mellitus   . Diverticulosis   . Dyspepsia   . Fecal incontinence   . Gastritis   . GERD (gastroesophageal reflux disease)   . 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 Hemorrhoids   . Hiatal hernia   . Hyperlipidemia   . Hypertension   . Hypothyroidism   . IBS (irritable bowel syndrome)   . Iron overload 07/13/2016  . 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 Osteoporosis   . UTI (lower urinary tract infection)   . Vitamin D deficiency     Encounter Details: CNP Questionnaire - 07/17/17 1230      Questionnaire   Patient Status  Immigrant    Race  Asian    Location Patient Served At  Not Applicable [place]  Medicare    Uninsured  Not Applicable    Food  No food insecurities    Housing/Utilities  No permanent housing    Transportation  No transportation needs    Interpersonal Safety  Yes, feel physically and emotionally safe where you currently live    Medication  No medication insecurities    Medical Provider  Yes    Referrals  Medication Assistance    ED Visit Averted  Not Applicable    Life-Saving Intervention Made  Not Applicable      Pt wanted refill medication for knee pain. Asked his son brought medication to [facility] and requested to pharmacyst to send order from Dr.. Also c/o sprint Rt. Ankle. No redness or swollen noted. Ace wrapp applied to ankle. Will f/p.

## 2017-08-07 ENCOUNTER — Encounter: Payer: Self-pay | Admitting: *Deleted

## 2017-08-08 NOTE — Congregational Nurse Program (Unsigned)
Congregational Nurse Program Note  Date of Encounter: 08/07/2017  Past Medical History: Past Medical History:  Diagnosis Date  . Allergy   . Aortic atherosclerosis ([place])   . Diabetes mellitus   . Diverticulosis   . Dyspepsia   . Fecal incontinence   . Gastritis   . GERD (gastroesophageal reflux disease)   . 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 Hemorrhoids   . Hiatal hernia   . Hyperlipidemia   . Hypertension   . Hypothyroidism   . IBS (irritable bowel syndrome)   . Iron overload 07/13/2016  . 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 Osteoporosis   . UTI (lower urinary tract infection)   . Vitamin D deficiency     Encounter Details: CNP Questionnaire - 08/07/17 1100      Questionnaire   Patient Status  Immigrant    Race  Asian    Location Patient Served At  Not Applicable [place]  Medicare    Uninsured  Not Applicable    Food  No food insecurities    Housing/Utilities  No permanent housing    Transportation  Yes, need transportation assistance    Interpersonal Safety  Yes, feel physically and emotionally safe where you currently live    Medication  No medication insecurities    Medical Provider  Yes    Referrals  Not Applicable    ED Visit Averted  Not Applicable    Life-Saving Intervention Made  Not Applicable      til 03/29/5206 , no car. Needed ride to church and back home. Arranged  Ride.  Otherwise no distress noted.

## 2017-08-29 ENCOUNTER — Encounter: Payer: Self-pay | Admitting: *Deleted

## 2017-08-29 DIAGNOSIS — I10 Essential (primary) hypertension: Secondary | ICD-10-CM | POA: Diagnosis not present

## 2017-08-29 DIAGNOSIS — E1165 Type 2 diabetes mellitus with hyperglycemia: Secondary | ICD-10-CM | POA: Diagnosis not present

## 2017-08-31 ENCOUNTER — Encounter: Payer: Self-pay | Admitting: *Deleted

## 2017-09-08 ENCOUNTER — Encounter: Payer: Self-pay | Admitting: *Deleted

## 2017-09-11 ENCOUNTER — Telehealth: Payer: Self-pay | Admitting: *Deleted

## 2017-09-11 NOTE — Congregational Nurse Program (Unsigned)
Congregational Nurse Program Note  Date of Encounter: 08/29/2017  Past Medical History: Past Medical History:  Diagnosis Date  . Allergy   . Aortic atherosclerosis ([place])   . Diabetes mellitus   . Diverticulosis   . Dyspepsia   . Fecal incontinence   . Gastritis   . GERD (gastroesophageal reflux disease)   . 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 Hemorrhoids   . Hiatal hernia   . Hyperlipidemia   . Hypertension   . Hypothyroidism   . IBS (irritable bowel syndrome)   . Iron overload 07/13/2016  . 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 Osteoporosis   . UTI (lower urinary tract infection)   . Vitamin D deficiency     Encounter Details: CNP Questionnaire - 08/29/17 0900      Questionnaire   Patient Status  Immigrant    Race  Asian    Location Patient [place]  Medicare    Uninsured  Not Applicable    Food  No food insecurities    Housing/Utilities  No permanent housing    Transportation  No transportation needs    Interpersonal Safety  Yes, feel physically and emotionally safe where you currently live    Medication  No medication insecurities    Medical Provider  Yes    Referrals  Not Applicable    ED Visit Averted  Not Applicable    Life-Saving Intervention Made  Not Applicable      labs done in the [facility] clinic. Assist w/ translate. C/o intermittent HA, releived by Ibuprofen. Will f/u with Dr. Maudie Mercury 09/02/2017

## 2017-09-11 NOTE — Congregational Nurse Program (Unsigned)
Congregational Nurse Program Note  Date of Encounter: 09/08/2017  Past Medical History: Past Medical History:  Diagnosis Date  . Allergy   . Aortic atherosclerosis ([place])   . Diabetes mellitus   . Diverticulosis   . Dyspepsia   . Fecal incontinence   . Gastritis   . GERD (gastroesophageal reflux disease)   . 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 Hemorrhoids   . Hiatal hernia   . Hyperlipidemia   . Hypertension   . Hypothyroidism   . IBS (irritable bowel syndrome)   . Iron overload 07/13/2016  . 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 Osteoporosis   . UTI (lower urinary tract infection)   . Vitamin D deficiency     Encounter Details: 09/08/2017 1600 Home visit. Pt said medicines came but Losartan was not same. Check with pharmacist, same medicine, but different company. Confirmed medicine color and dosage, numbers and letter on the medicine. Also checked mails,  Her apt. Rent bill decrease at 10/13/2017.  Made sure her Dr's appointment is 09/14/2017 9:45 am. No distress noted.  C/o unsteady gait.  Slow movement encouraged.

## 2017-09-11 NOTE — Congregational Nurse Program (Unsigned)
Congregational Nurse Program Note  Date of Encounter: 08/31/2017  Past Medical History: Past Medical History:  Diagnosis Date  . Allergy   . Aortic atherosclerosis ([place])   . Diabetes mellitus   . Diverticulosis   . Dyspepsia   . Fecal incontinence   . Gastritis   . GERD (gastroesophageal reflux disease)   . 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 Hemorrhoids   . Hiatal hernia   . Hyperlipidemia   . Hypertension   . Hypothyroidism   . IBS (irritable bowel syndrome)   . Iron overload 07/13/2016  . 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 Osteoporosis   . UTI (lower urinary tract infection)   . Vitamin D deficiency     Encounter Details: CNP Questionnaire - 08/31/17 1000      Questionnaire   Patient Status  Immigrant    Race  Asian    Location Patient Served At  Not Applicable home visit   home visit   Insurance  Medicare    Uninsured  Not Applicable    Food  No food insecurities    Housing/Utilities  No permanent housing    Transportation  No transportation needs    Interpersonal Safety  Yes, feel physically and emotionally safe where you currently live    Medication  Provided medication assistance;No medication insecurities    Medical Provider  Yes    Referrals  Not Applicable    ED Visit Averted  Not Applicable    Life-Saving Intervention Made  Not Applicable      home visit. Needed medicine order and mail check. Reminded pt for theappontment 09/02/2017. Will meet at Dr. Julianne Rice clinc.

## 2017-09-11 NOTE — Congregational Nurse Program (Signed)
09/02/2017  Late entry. Received call from Dr. Julianne Rice office to cancel appointment due to Dr. Was called out. Rescheduled to 7/3/ 2019 9:45am. Notified Mrs. Chaudhuri by phonex4  Due to processing called to Dr. Julianne Rice nurse, make an appointment.

## 2017-09-14 ENCOUNTER — Encounter: Payer: Self-pay | Admitting: *Deleted

## 2017-09-17 ENCOUNTER — Encounter: Payer: Self-pay | Admitting: *Deleted

## 2017-09-19 NOTE — Congregational Nurse Program (Unsigned)
Congregational Nurse Program Note  Date of Encounter: 09/14/2017  Past Medical History: Past Medical History:  Diagnosis Date  . Allergy   . Aortic atherosclerosis ([place])   . Diabetes mellitus   . Diverticulosis   . Dyspepsia   . Fecal incontinence   . Gastritis   . GERD (gastroesophageal reflux disease)   . 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 Hemorrhoids   . Hiatal hernia   . Hyperlipidemia   . Hypertension   . Hypothyroidism   . IBS (irritable bowel syndrome)   . Iron overload 07/13/2016  . 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 Osteoporosis   . UTI (lower urinary tract infection)   . Vitamin D deficiency     Encounter Details: CNP Questionnaire - 09/11/17 0253      Questionnaire   Patient Status  Immigrant    Race  Asian    Location Patient Served At  Not Applicable home visit   home visit   Insurance  Medicare    Uninsured  Not Applicable    Food  No food insecurities    Housing/Utilities  No permanent housing    Transportation  No transportation needs    Interpersonal Safety  Yes, feel physically and emotionally safe where you currently live    Medication  No medication insecurities    Medical Provider  Yes    Referrals  Not Applicable    ED Visit Averted  Not Applicable    Life-Saving Intervention Made  Not Applicable       Post phoned Dr's appointment due to no translator, to 09/22/2017 3P.

## 2017-09-19 NOTE — Congregational Nurse Program (Unsigned)
Congregational Nurse Program Note  Date of Encounter: 09/17/2017  Past Medical History: Past Medical History:  Diagnosis Date  . Allergy   . Aortic atherosclerosis ([place])   . Diabetes mellitus   . Diverticulosis   . Dyspepsia   . Fecal incontinence   . Gastritis   . GERD (gastroesophageal reflux disease)   . 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 Hemorrhoids   . Hiatal hernia   . Hyperlipidemia   . Hypertension   . Hypothyroidism   . IBS (irritable bowel syndrome)   . Iron overload 07/13/2016  . 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 Osteoporosis   . UTI (lower urinary tract infection)   . Vitamin D deficiency     Encounter Details: CNP Questionnaire - 09/19/17 0637      Questionnaire   Patient Status  Immigrant    Race  Asian    Location Patient Served At  Not Applicable [place]  Medicare    Uninsured  Not Applicable    Food  No food insecurities    Housing/Utilities  No permanent housing    Transportation  Yes, need transportation assistance    Interpersonal Safety  Yes, feel physically and emotionally safe where you currently live    Medication  No medication insecurities    Medical Provider  Yes    Referrals  Not Applicable    ED Visit Averted  Not Applicable    Life-Saving Intervention Made  Not Applicable      c/o hazy head and headache. Pt said her car was towing by banker.  The son-in-law supposed pay car monthly, but bank draft was not made,  She was very anxious. Asked to go hone and rest, cold help to take Ibuprofen for HA. Will f/p

## 2017-09-25 ENCOUNTER — Encounter: Payer: Self-pay | Admitting: *Deleted

## 2017-09-25 NOTE — Congregational Nurse Program (Unsigned)
Congregational Nurse Program Note  Date of Encounter: 09/25/2017  Past Medical History: Past Medical History:  Diagnosis Date  . Allergy   . Aortic atherosclerosis ([place])   . Diabetes mellitus   . Diverticulosis   . Dyspepsia   . Fecal incontinence   . Gastritis   . GERD (gastroesophageal reflux disease)   . 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 Hemorrhoids   . Hiatal hernia   . Hyperlipidemia   . Hypertension   . Hypothyroidism   . IBS (irritable bowel syndrome)   . Iron overload 07/13/2016  . 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 Osteoporosis   . UTI (lower urinary tract infection)   . Vitamin D deficiency     Encounter Details: CNP Questionnaire - 09/25/17 1030      Questionnaire   Patient Status  Immigrant    Race  Asian    Location Patient Served At  Not Applicable [place]  Medicare    Uninsured  Not Applicable    Food  No food insecurities    Housing/Utilities  No permanent housing    Transportation  No transportation needs    Interpersonal Safety  Yes, feel physically and emotionally safe where you currently live    Medication  No medication insecurities    Medical Provider  Yes    Referrals  Not Applicable    ED Visit Averted  Not Applicable    Life-Saving Intervention Made  Not Applicable     Postphoned Dr's appointment with Dr.Kime due to absence of Dr. Rescheduled on 7/17 @8 :45 am. BP ok. Safety issue explained, keep record when is worst symptom occurred,  Write down what is question to Dr. For appointment.

## 2017-09-28 ENCOUNTER — Encounter: Payer: Self-pay | Admitting: *Deleted

## 2017-09-28 DIAGNOSIS — Z0001 Encounter for general adult medical examination with abnormal findings: Secondary | ICD-10-CM | POA: Diagnosis not present

## 2017-09-28 DIAGNOSIS — R42 Dizziness and giddiness: Secondary | ICD-10-CM | POA: Diagnosis not present

## 2017-09-28 DIAGNOSIS — E785 Hyperlipidemia, unspecified: Secondary | ICD-10-CM | POA: Diagnosis not present

## 2017-09-28 DIAGNOSIS — I10 Essential (primary) hypertension: Secondary | ICD-10-CM | POA: Diagnosis not present

## 2017-09-28 DIAGNOSIS — E1165 Type 2 diabetes mellitus with hyperglycemia: Secondary | ICD-10-CM | POA: Diagnosis not present

## 2017-10-04 ENCOUNTER — Telehealth: Payer: Self-pay | Admitting: *Deleted

## 2017-10-04 ENCOUNTER — Encounter: Payer: Self-pay | Admitting: *Deleted

## 2017-10-04 NOTE — Congregational Nurse Program (Unsigned)
Congregational Nurse Program Note  Date of Encounter: 10/03/2017 Past Medical History: Past Medical History:  Diagnosis Date  . Allergy   . Aortic atherosclerosis ([place])   . Diabetes mellitus   . Diverticulosis   . Dyspepsia   . Fecal incontinence   . Gastritis   . GERD (gastroesophageal reflux disease)   . 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 Hemorrhoids   . Hiatal hernia   . Hyperlipidemia   . Hypertension   . Hypothyroidism   . IBS (irritable bowel syndrome)   . Iron overload 07/13/2016  . 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 Osteoporosis   . UTI (lower urinary tract infection)   . Vitamin D deficiency     Encounter Details: CNP Questionnaire - 09/28/17 1040      Questionnaire   Patient Status  Immigrant    Race  Asian    Location Patient Served At  Not Applicable Dr's office   Dr's office   Insurance  Medicare    Uninsured  Not Applicable    Food  No food insecurities    Housing/Utilities  No permanent housing    Transportation  No transportation needs    Interpersonal Safety  Yes, feel physically and emotionally safe where you currently live    Medication  No medication insecurities    Medical Provider  Yes    Referrals  Other Neurology per MD   Neurology per MD   ED Visit Averted  Yes    Life-Saving Intervention Made  Not Applicable     remains dizziness but felt little bit better this am per pt. reminded pt to keep safe , keep taking med for dizziness per MD order.

## 2017-10-04 NOTE — Congregational Nurse Program (Unsigned)
Congregational Nurse Program Note  Date of Encounter: 09/28/2017  Past Medical History: Past Medical History:  Diagnosis Date  . Allergy   . Aortic atherosclerosis ([place])   . Diabetes mellitus   . Diverticulosis   . Dyspepsia   . Fecal incontinence   . Gastritis   . GERD (gastroesophageal reflux disease)   . 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 Hemorrhoids   . Hiatal hernia   . Hyperlipidemia   . Hypertension   . Hypothyroidism   . IBS (irritable bowel syndrome)   . Iron overload 07/13/2016  . 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 Osteoporosis   . UTI (lower urinary tract infection)   . Vitamin D deficiency     Encounter Details: CNP Questionnaire - 09/28/17 1040      Questionnaire   Patient Status  Immigrant    Race  Asian    Location Patient Served At  Not Applicable Dr's office   Dr's office   Insurance  Medicare    Uninsured  Not Applicable    Food  No food insecurities    Housing/Utilities  No permanent housing    Transportation  No transportation needs    Interpersonal Safety  Yes, feel physically and emotionally safe where you currently live    Medication  No medication insecurities    Medical Provider  Yes    Referrals  Other Neurology per MD   Neurology per MD   ED Visit Averted  Yes    Life-Saving Intervention Made  Not Applicable      c/o dizziness. Dr. Maudie Mercury referal neurology to f/u for dizziness. medicin ordered for dizziness, check up 2weeks later on 10/12/2017. Otherwise lab is ok except triglycerin high. Increase fish oil BID. Made appointment with Dr. Maudie Mercury 10/12/2017, neurology on 11/28/2017. Pick up medicine @ [facility] for dizziness.

## 2017-10-08 ENCOUNTER — Telehealth: Payer: Self-pay | Admitting: *Deleted

## 2017-10-12 ENCOUNTER — Encounter (HOSPITAL_COMMUNITY): Payer: Self-pay | Admitting: *Deleted

## 2017-10-12 DIAGNOSIS — E559 Vitamin D deficiency, unspecified: Secondary | ICD-10-CM | POA: Diagnosis not present

## 2017-10-12 DIAGNOSIS — K219 Gastro-esophageal reflux disease without esophagitis: Secondary | ICD-10-CM | POA: Diagnosis not present

## 2017-10-12 DIAGNOSIS — R42 Dizziness and giddiness: Secondary | ICD-10-CM | POA: Diagnosis not present

## 2017-10-12 DIAGNOSIS — I10 Essential (primary) hypertension: Secondary | ICD-10-CM | POA: Diagnosis not present

## 2017-10-12 DIAGNOSIS — E785 Hyperlipidemia, unspecified: Secondary | ICD-10-CM | POA: Diagnosis not present

## 2017-10-14 NOTE — Congregational Nurse Program (Signed)
Congregational Nurse Program Note  Date of Encounter: 10/12/2017  Past Medical History: Past Medical History:  Diagnosis Date  . Allergy   . Aortic atherosclerosis ([place])   . Diabetes mellitus   . Diverticulosis   . Dyspepsia   . Fecal incontinence   . Gastritis   . GERD (gastroesophageal reflux disease)   . 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 Hemorrhoids   . Hiatal hernia   . Hyperlipidemia   . Hypertension   . Hypothyroidism   . IBS (irritable bowel syndrome)   . Iron overload 07/13/2016  . 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 Osteoporosis   . UTI (lower urinary tract infection)   . Vitamin D deficiency     Encounter Details: CNP Questionnaire - 10/12/17 1630      Questionnaire   Patient Status  Immigrant    Race  Asian    Location Patient Served At  Not Applicable dr's office   dr's office   Insurance  Medicare    Uninsured  Not Applicable    Food  No food insecurities    Housing/Utilities  No permanent housing    Transportation  No transportation needs    Interpersonal Safety  Yes, feel physically and emotionally safe where you currently live    Medication  No medication insecurities    Medical Provider  Yes    Referrals  Other neurology 9/16 per Dr. Maudie Mercury   neurology 9/16 per Dr. Maudie Mercury   ED Visit Averted  Not [place]  Not Applicable      F/P with Dr.Ivanov for dizziness. Pt was on Mclizine qDay for dizziness 2weeks, remains symptom  Dr. Maudie Mercury said stop Mclizine, try stop Pravastatin, fosamax, 1 week, if not sx better, switches  The Janumet to new medicine witch he gave her sample, need f/p  1 month. Made appointment on 11/10/2017 @ 1630.

## 2017-11-17 ENCOUNTER — Encounter: Payer: Self-pay | Admitting: *Deleted

## 2017-11-17 DIAGNOSIS — H40033 Anatomical narrow angle, bilateral: Secondary | ICD-10-CM | POA: Diagnosis not present

## 2017-11-17 DIAGNOSIS — H40013 Open angle with borderline findings, low risk, bilateral: Secondary | ICD-10-CM | POA: Diagnosis not present

## 2017-11-20 ENCOUNTER — Telehealth: Payer: Self-pay | Admitting: *Deleted

## 2017-11-28 ENCOUNTER — Telehealth: Payer: Self-pay | Admitting: Neurology

## 2017-11-28 ENCOUNTER — Ambulatory Visit: Payer: Medicare Other | Admitting: Neurology

## 2017-11-28 ENCOUNTER — Encounter: Payer: Self-pay | Admitting: Neurology

## 2017-11-28 ENCOUNTER — Encounter: Payer: Self-pay | Admitting: *Deleted

## 2017-11-28 VITALS — BP 156/69 | HR 70 | Ht 60.0 in | Wt 115.0 lb

## 2017-11-28 DIAGNOSIS — H81399 Other peripheral vertigo, unspecified ear: Secondary | ICD-10-CM

## 2017-11-28 DIAGNOSIS — R42 Dizziness and giddiness: Secondary | ICD-10-CM | POA: Diagnosis not present

## 2017-11-28 NOTE — Progress Notes (Signed)
Subjective:    Patient ID: Lauren [name] is a 77 y.o. female.  HPI     Star Age, MD, PhD [facility] [address], [address] [place], [name] [address]  Dear Dr. Maudie Mercury,   I saw your patient, Lauren [name], upon your kind request in my neurologic clinic today for initial consultation of her dizziness. The patient is accompanied by Ronalee Red, congregational nurse, whom she knows for 20 years, today. As you know, Lauren [name] is a 76 year old right-handed woman with an underlying medical history of hypertension, hyperlipidemia, hypothyroidism, diabetes, reflux disease, diverticulosis, anemia, and osteoporosis, who reports intermittent dizzy spells described as feeling of off balance when changing head positions. Thankfully, she has not had any recent falls. I reviewed your office note from 09/28/2017 which you kindly included. For her anemia she had seen hematology last year. 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 She improved. Her dizzy spells came on gradually in June 2019. She also had a bout of vertigo or dizzy spells last year in or around November, had an MRI at the time. She has not had any recent physical therapy. She is widowed, has 3 children, lives alone. She had a CTH head wo contrast on 12/13/14, and I reviewed the results: IMPRESSION: No acute intracranial findings.   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  She had a more recent brain MRI w/wo contrast on 01/30/18 and I reviewed the results: IMPRESSION: Unremarkable exam for age.  No explanation for symptoms.  She tried meclizine for about 2 weeks in July which did not help so she stopped it. She had blood work in June through your office which I reviewed. She brought a copy. CBC with differential was unremarkable with the exception of borderline hematocrit at 36.2. BMP was unremarkable with the exception of glucose at 128, A1c was 5.8. She had a normal  lipid profile, triglycerides 173. BUN 18, creatinine 0.54. She tries to hydrate well. She drinks about 6 cups of water per day. She was exercising regularly by walking 30-40 minutes daily but because of fear of falling she stopped exercising. She drinks caffeine, one serving per day, drinks herbal tea without caffeine. She is a nonsmoker and does not utilize alcohol. Sometimes she feels off-balance when she suddenly stands from the seated position. She has also had lightheadedness or off-balance sensation when she stands for prolonged period of time. She had a recent eye examination. She was also seen by ENT, Dr. Benjamine Mola, and was told she had mild hearing loss.   Her Past Medical History Is Significant For: Past Medical History:  Diagnosis Date  . Allergy   . Aortic atherosclerosis ([place])   . Diabetes mellitus   . Diverticulosis   . Dyspepsia   . Fecal incontinence   . Gastritis   . GERD (gastroesophageal reflux disease)   . 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 Hemorrhoids   . Hiatal hernia   . Hyperlipidemia   . Hyperlipidemia   . Hypertension   . Hypothyroidism   . IBS (irritable bowel syndrome)   . Iron overload 07/13/2016  . 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 Osteoporosis   . UTI (lower urinary tract infection)   . Vitamin D deficiency     Her Past Surgical History Is Significant For: Past Surgical History:  Procedure Laterality Date  . COLONOSCOPY  Her Family History Is Significant For: Family History  Problem Relation Age of Onset  . Colon cancer Neg Hx     Her Social History Is Significant For: Social History   Socioeconomic History  . Marital status: Widowed    Spouse name: Not on file  . Number of children: 3  . Years of education: Not on file  . Highest education level: Not on file  Occupational History  . Occupation: retired    Fish farm manager: RETIRED  Social Needs  . Financial resource strain: Not  on file  . Food insecurity:    Worry: Not on file    Inability: Not on file  . Transportation needs:    Medical: Not on file    Non-medical: Not on file  Tobacco Use  . Smoking status: Never Smoker  . Smokeless tobacco: Never Used  Substance and Sexual Activity  . Alcohol use: No  . Drug use: No  . Sexual activity: Never  Lifestyle  . Physical activity:    Days per week: Not on file    Minutes per session: Not on file  . Stress: Not on file  Relationships  . Social connections:    Talks on phone: Not on file    Gets together: Not on file    Attends religious service: Not on file    Active member of club or organization: Not on file    Attends meetings of clubs or organizations: Not on file    Relationship status: Not on file  Other Topics Concern  . Not on file  Social History Narrative  . Not on file    Her Allergies Are:  Allergies  Allergen Reactions  . Pyridium [Phenazopyridine Hcl] Other (See Comments)    Hemolytic anemia  . Sulfa Antibiotics     Potential allergy: oxidizing drug: methemoglobinemia/hemolysis on pyridium  . Ciprofloxacin Hcl     Sx's almost like a heart attack  . Esomeprazole Magnesium Nausea Only    Pain and nausea  . Macrodantin [Nitrofurantoin Macrocrystal] Other (See Comments)    GI Upset  . Pantoprazole Sodium Nausea Only    abd pain and nausea  :   Her Current Medications Are:  Outpatient Encounter Medications as of 11/28/2017  Medication Sig  . Cholecalciferol (VITAMIN D3) 2000 UNITS TABS Take 1 tablet by mouth daily.  . Empagliflozin-metFORMIN HCl (SYNJARDY) 07-998 MG TABS Take 1 tablet by mouth daily.  . folic acid (FOLVITE) 1 MG tablet Take 1 mg by mouth daily.  Marland Kitchen glucosamine-chondroitin 500-400 MG tablet Take 1 tablet by mouth 3 (three) times daily.  Marland Kitchen levothyroxine (SYNTHROID, LEVOTHROID) 75 MCG tablet Take 75 mcg by mouth daily.    Marland Kitchen losartan (COZAAR) 100 MG tablet Take 100 mg by mouth daily.    Benita Stabile HCL PO Take 25 mg  by mouth daily.   . Multiple Vitamin (MULTIVITAMIN) tablet Take 1 tablet by mouth daily.  Marland Kitchen omeprazole (PRILOSEC) 40 MG capsule Take 1 capsule (40 mg total) by mouth daily.  . sitaGLIPtan-metformin (JANUMET) 50-500 MG per tablet Take 1 tablet by mouth 2 (two) times daily with a meal.    . vitamin B-12 (CYANOCOBALAMIN) 100 MCG tablet Take 100 mcg by mouth daily.  . [DISCONTINUED] aspirin 81 MG tablet Take 81 mg by mouth daily.    . [DISCONTINUED] Magnesium 400 MG CAPS Take 1 capsule by mouth daily.  . [DISCONTINUED] pravastatin (PRAVACHOL) 40 MG tablet Take 40 mg by mouth daily.    . furosemide (LASIX)  20 MG tablet Take 20 mg by mouth daily as needed.  . [DISCONTINUED] alendronate (FOSAMAX) 70 MG tablet Take 70 mg by mouth once a week. Take with a full glass of water on an empty stomach.   No facility-administered encounter medications on file as of 11/28/2017.   :   Review of Systems:  Out of a complete 14 point review of systems, all are reviewed and negative with the exception of these symptoms as listed below:  Review of Systems  Neurological:       Pt presents to discuss her dizziness. Her dizziness occurs when going from sitting to standing. Pt reports that the dizziness is not as bad as it used to be.    Objective:  Neurological Exam  Physical Exam Physical Examination:   Vitals:   11/28/17 0956  BP: (!) 156/69  Pulse: 70   On orthostatic testing, lying blood pressure and pulse were 143/73 with a pulse of 72, sitting 156/69 with a pulse of 70, standing 143/68 with pulse of 71.  No vertiginous symptoms upon changing positions, mild lightheadedness reported.  General Examination: The patient is a very pleasant 77 y.o. female in no acute distress. She appears well-developed and well-nourished and well groomed.   HEENT: Normocephalic, atraumatic, pupils are equal, round and reactive to light and accommodation. Funduscopic exam is normal with sharp disc margins noted.  Extraocular tracking is good without limitation to gaze excursion or nystagmus noted. Normal smooth pursuit is noted. Hearing is grossly intact. Tympanic membranes are clear bilaterally. Face is symmetric with normal facial animation and normal facial sensation. Speech is clear with no dysarthria noted. There is no hypophonia. No vertigo or nystagmus upon sudden changes of head position. There is no lip, neck/head, jaw or voice tremor. Neck is supple with full range of passive and active motion. There are no carotid bruits on auscultation. Oropharynx exam reveals: mild mouth dryness, adequate dental hygiene. Tongue protrudes centrally and palate elevates symmetrically.   Chest: Clear to auscultation without wheezing, rhonchi or crackles noted.  Heart: S1+S2+0, regular and normal without murmurs, rubs or gallops noted.   Abdomen: Soft, non-tender and non-distended with normal bowel sounds appreciated on auscultation.  Extremities: There is no pitting edema in the distal lower extremities bilaterally.   Skin: Warm and dry without trophic changes noted.  Musculoskeletal: exam reveals no obvious joint deformities, tenderness or joint swelling or erythema.   Neurologically:  Mental status: The patient is awake, alert and oriented in all 4 spheres. Her immediate and remote memory, attention, language skills and fund of knowledge are appropriate. There is no evidence of aphasia, agnosia, apraxia or anomia. Speech is clear with normal prosody and enunciation. Thought process is linear. Mood is normal and affect is normal.  Cranial nerves II - XII are as described above under HEENT exam. In addition: shoulder shrug is normal with equal shoulder height noted. Motor exam: Normal bulk, strength and tone is noted. There is no drift, tremor or rebound. Romberg is negative. Reflexes are 1+ throughout. Fine motor skills and coordination: intact with normal finger taps, normal hand movements, normal rapid  alternating patting, normal foot taps and normal foot agility.  Cerebellar testing: No dysmetria or intention tremor on finger to nose testing.  Sensory exam: intact to light touch in the upper and lower extremities.  Gait, station and balance: She stands easily. No veering to one side is noted. No leaning to one side is noted. Posture is age-appropriate and stance is narrow  based. Gait shows normal stride length and normal pace. No problems turning are noted. Tandem walk is difficult for her (not unusual for age).              Assessment and Plan:  Assessment and Plan:  In summary, Deniz GAILYN CROOK is a very pleasant 77 y.o.-year old female with an underlying medical history of hypertension, hyperlipidemia, hypothyroidism, diabetes, reflux disease, diverticulosis, anemia, and osteoporosis, who presents for evaluation of her dizziness. She reports a gradual onset of dizzy spells since June 2019 but had prior issues with dizziness in the past. MRI from November 2019 showed normal findings for age. Her neurological examination is nonfocal which is reassuring. She did have mild lightheadedness and insecurity when she first stood up and her systolic blood pressure did drop for a total of 13 points which is typically not considered significant enough. Nevertheless, we talked about supportive treatments including changing positions slowly, using a cane or walking staff for gait safety, resuming some form of regular physical activity and stay well-hydrated. For diagnostic completion and to rule out a structural cause of her symptoms I suggested we proceed with a brain MRI with and without contrast, we will talk to her nurse, Hyang about results (due to language barrier). If her brain MRI also shows age-appropriate findings and is nonrevealing, I suggested as needed follow-up. I answered all their questions today and the patient and her nurse were in agreement.  Thank you very much for allowing me to participate in the  care of this nice patient. If I can be of any further assistance to you please do not hesitate to call me at [phone].  Sincerely,   Star Age, MD, PhD

## 2017-11-28 NOTE — Telephone Encounter (Signed)
[employer] Medicare order sent to GI. No auth they will reach out to the pt to schedule.  °

## 2017-11-28 NOTE — Patient Instructions (Addendum)
Unfortunately, dizziness is a very common complaint but is often not due to a primary neurological reason or single underlying medical problem. Often, there a combination of factors, that result in dizziness. This includes blood pressure fluctuations, medication side effects, blood sugar fluctuations, stress, vertigo, poor sleep with sleep deprivation, dehydration, and electrolyte disturbance or other metabolic and endocrinological reasons, meaning hormone related problems such as thyroid dysfunction.  We will investigate things further with a brain MRI. We will call you with the test results. You may consider physical therapy. Talk to Dr. Maudie Mercury about it.   Please change position slowly and get your bearings first, you may consider a cane for safety.   Thankfully, your exam is normal.

## 2017-12-08 DIAGNOSIS — Z1231 Encounter for screening mammogram for malignant neoplasm of breast: Secondary | ICD-10-CM | POA: Diagnosis not present

## 2017-12-16 DIAGNOSIS — R42 Dizziness and giddiness: Secondary | ICD-10-CM | POA: Diagnosis not present

## 2017-12-16 DIAGNOSIS — Z23 Encounter for immunization: Secondary | ICD-10-CM | POA: Diagnosis not present

## 2017-12-16 DIAGNOSIS — E1165 Type 2 diabetes mellitus with hyperglycemia: Secondary | ICD-10-CM | POA: Diagnosis not present

## 2017-12-16 DIAGNOSIS — I10 Essential (primary) hypertension: Secondary | ICD-10-CM | POA: Diagnosis not present

## 2017-12-16 DIAGNOSIS — D649 Anemia, unspecified: Secondary | ICD-10-CM | POA: Diagnosis not present

## 2017-12-16 DIAGNOSIS — M810 Age-related osteoporosis without current pathological fracture: Secondary | ICD-10-CM | POA: Diagnosis not present

## 2017-12-21 ENCOUNTER — Encounter: Payer: Self-pay | Admitting: *Deleted

## 2017-12-21 NOTE — Congregational Nurse Program (Unsigned)
  Dept: [facility] Nurse Program Note  Date of Encounter: 12/16/2017 Past Medical History: Past Medical History:  Diagnosis Date  . Allergy   . Aortic atherosclerosis ([place])   . Diabetes mellitus   . Diverticulosis   . Dyspepsia   . Fecal incontinence   . Gastritis   . GERD (gastroesophageal reflux disease)   . 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 Hemorrhoids   . Hiatal hernia   . Hyperlipidemia   . Hyperlipidemia   . Hypertension   . Hypothyroidism   . IBS (irritable bowel syndrome)   . Iron overload 07/13/2016  . 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 Osteoporosis   . UTI (lower urinary tract infection)   . Vitamin D deficiency     Encounter Details: CNP Questionnaire - 12/16/17 1100      Questionnaire   Patient Status  Immigrant    Race  Asian    Location Patient Served At  Not Applicable   Dr's office   Insurance  Medicare    Uninsured  Not Applicable    Food  No food insecurities    Housing/Utilities  No permanent housing    Transportation  No transportation needs    Interpersonal Safety  Yes, feel physically and emotionally safe where you currently live    Medication  No medication insecurities    Medical Provider  Yes    Referrals  Not Applicable    ED Visit Averted  Not Applicable    Life-Saving Intervention Made  Not Applicable      PA visit due to pt's Dr. Is out of work for his illness . F/u dizziness. Pt asked to back to Janumet for DM. Due to new meds made her hunger and no energy occurred suddenly. Called neurology to made sure for MRI head scheduled for f/u dizziness. Remains dizziness to restrict actinities due to dizziness. 10/16 MRI scheduled at Imaging center, notified to pt.

## 2017-12-21 NOTE — Congregational Nurse Program (Unsigned)
  Dept: [facility] Nurse Program Note  Date of Encounter: 12/08/2017 Past Medical History: Past Medical History:  Diagnosis Date  . Allergy   . Aortic atherosclerosis ([place])   . Diabetes mellitus   . Diverticulosis   . Dyspepsia   . Fecal incontinence   . Gastritis   . GERD (gastroesophageal reflux disease)   . 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 Hemorrhoids   . Hiatal hernia   . Hyperlipidemia   . Hyperlipidemia   . Hypertension   . Hypothyroidism   . IBS (irritable bowel syndrome)   . Iron overload 07/13/2016  . 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 Osteoporosis   . UTI (lower urinary tract infection)   . Vitamin D deficiency     Encounter Details: CNP Questionnaire - 12/08/17 1000      Questionnaire   Patient Status  Immigrant    Race  Asian    Location Patient Served At  --   mammogram   Insurance  Medicare    Uninsured  Not Applicable    Food  No food insecurities    Housing/Utilities  No permanent housing    Transportation  Yes, need transportation assistance    Interpersonal Safety  Yes, feel physically and emotionally safe where you currently live    Medication  No medication insecurities    Medical Provider  Yes    Referrals  Not Applicable    ED Visit Averted  Not Applicable    Life-Saving Intervention Made  Not Applicable      Mammogram at [facility]. Gave ride and translated. Later, received text message from [place] that negative mammogram. Notified to Mrs. Jose the result.

## 2017-12-28 ENCOUNTER — Ambulatory Visit
Admission: RE | Admit: 2017-12-28 | Discharge: 2017-12-28 | Disposition: A | Discharge status: Home / Self Care | Payer: Medicare Other | Source: Ambulatory Visit | Attending: Neurology | Admitting: Neurology

## 2017-12-28 DIAGNOSIS — H81399 Other peripheral vertigo, unspecified ear: Secondary | ICD-10-CM

## 2017-12-28 MED ORDER — GADOBENATE DIMEGLUMINE 529 MG/ML IV SOLN
10.0000 mL | Freq: Once | INTRAVENOUS | Status: AC | PRN
  Administered 2017-12-28: 10 mL via INTRAVENOUS

## 2017-12-29 ENCOUNTER — Telehealth: Payer: Self-pay | Admitting: Neurology

## 2017-12-29 NOTE — Telephone Encounter (Addendum)
Left message requesting a return call (must go through language line - pt speaks [place]).

## 2017-12-29 NOTE — Telephone Encounter (Signed)
Please call patient with MRI of the brain showed moderate perisylvian atrophy, mild supratentorium small vessel disease there was no acute abnormality.  IMPRESSION:   MRI brain (with and without) demonstrating: - Mild chronic small vessel ischemic disease. - Moderate perisylvian atrophy. - No acute findings.

## 2017-12-30 NOTE — Telephone Encounter (Signed)
Left another message requesting patient to return my call by using the language line.  Patient speaks [place].

## 2017-12-31 ENCOUNTER — Encounter: Payer: Self-pay | Admitting: *Deleted

## 2017-12-31 NOTE — Congregational Nurse Program (Unsigned)
  Dept: [facility] Nurse Program Note  Date of Encounter: 12/28/2017 Past Medical History: Past Medical History:  Diagnosis Date  . Allergy   . Aortic atherosclerosis ([place])   . Diabetes mellitus   . Diverticulosis   . Dyspepsia   . Fecal incontinence   . Gastritis   . GERD (gastroesophageal reflux disease)   . 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 Hemorrhoids   . Hiatal hernia   . Hyperlipidemia   . Hyperlipidemia   . Hypertension   . Hypothyroidism   . IBS (irritable bowel syndrome)   . Iron overload 07/13/2016  . 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 Osteoporosis   . UTI (lower urinary tract infection)   . Vitamin D deficiency     Encounter Details: CNP Questionnaire - 12/28/17 1300      Questionnaire   Patient Status  Immigrant    Race  Asian    Location Patient Served At  --   MRI   [employer];Not Applicable    Uninsured  Not Applicable    Food  No food insecurities    Housing/Utilities  No permanent housing    Transportation  Yes, need transportation assistance    Interpersonal Safety  Yes, feel physically and emotionally safe where you currently live    Medication  No medication insecurities    Medical Provider  Yes    Referrals  Not Applicable    ED Visit Averted  Not Applicable    Life-Saving Intervention Made  Not Applicable      MRI with& without contrast head done. Remains dizziness but much better. Gave pt ride , total spent time 4hours

## 2018-01-02 NOTE — Telephone Encounter (Signed)
Spoke to patient and her nurse, through our interpreter services line.  They are aware of her MRI results and verbalized understanding.

## 2018-01-26 ENCOUNTER — Encounter: Payer: Self-pay | Admitting: *Deleted

## 2018-01-26 NOTE — Congregational Nurse Program (Unsigned)
  Dept: [facility] Nurse Program Note  Date of Encounter: 01/26/2018  Past Medical History: Past Medical History:  Diagnosis Date  . Allergy   . Aortic atherosclerosis ([place])   . Diabetes mellitus   . Diverticulosis   . Dyspepsia   . Fecal incontinence   . Gastritis   . GERD (gastroesophageal reflux disease)   . 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 Hemorrhoids   . Hiatal hernia   . Hyperlipidemia   . Hyperlipidemia   . Hypertension   . Hypothyroidism   . IBS (irritable bowel syndrome)   . Iron overload 07/13/2016  . 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 Osteoporosis   . UTI (lower urinary tract infection)   . Vitamin D deficiency     Encounter Details: CNP Questionnaire - 01/26/18 1800      Questionnaire   Patient Status  Immigrant    Race  Asian    Location Patient Served At  Not Applicable   home visit   Insurance  Medicare    Uninsured  Not Applicable    Food  No food insecurities    Housing/Utilities  No permanent housing    Transportation  No transportation needs    Interpersonal Safety  Yes, feel physically and emotionally safe where you currently live    Medication  No medication insecurities    Medical Provider  Yes    Referrals  Not Applicable    ED Visit Averted  Not Applicable    Life-Saving Intervention Made  Not Applicable      pt stated fell twice last week. One episode was during the night, when get up to go to BR, rolled down from bed. No injury noted, and another episode was step down the stair in front of her house- just one step-, gave it her leg, hit the ground, Rt knee abrasion, other wise ok.  Still dizzy at times and fog head. But getting better now. And c/o hearing loss,getting worse. Pt has appointment with Dr. Benjamine Mola at January. Will monitor closely.

## 2018-02-04 ENCOUNTER — Encounter: Payer: Self-pay | Admitting: *Deleted

## 2018-02-04 NOTE — Congregational Nurse Program (Unsigned)
  Dept: [phone]   Congregational Nurse Program Note  Date of Encounter: 01/31/2018 Past Medical History: Past Medical History:  Diagnosis Date  . Allergy   . Aortic atherosclerosis ([place])   . Diabetes mellitus   . Diverticulosis   . Dyspepsia   . Fecal incontinence   . Gastritis   . GERD (gastroesophageal reflux disease)   . 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 Hemorrhoids   . Hiatal hernia   . Hyperlipidemia   . Hyperlipidemia   . Hypertension   . Hypothyroidism   . IBS (irritable bowel syndrome)   . Iron overload 07/13/2016  . 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 Osteoporosis   . UTI (lower urinary tract infection)   . Vitamin D deficiency     Encounter Details: CNP Questionnaire - 01/31/18 1000      Questionnaire   Patient Status  Immigrant    Race  Asian    Location Patient Served At  Not Applicable   home visit   Insurance  Medicare    Uninsured  Not Applicable    Food  No food insecurities    Housing/Utilities  No permanent housing    Transportation  No transportation needs    Interpersonal Safety  Yes, feel physically and emotionally safe where you currently live    Medication  No medication insecurities    Medical Provider  Yes    Referrals  Not Applicable    ED Visit Averted  Not Applicable    Life-Saving Intervention Made  Not Applicable     Visited home to help out paper work to [place] office for financial assist [facility] program.  Spiritual care given. Translated and help to fill out forms.

## 2018-02-06 ENCOUNTER — Other Ambulatory Visit: Payer: Self-pay

## 2018-02-06 NOTE — Patient Outreach (Signed)
[facility]) Care Management  02/06/2018  Lauren [name] 1940/12/23 [id number]    Medication Adherence call to Mrs. Lauren [name] left a message for patient to call back patient is due on Pravastatin 80 mg and Janumet 50/500 mg. [name] is showing past due under [name].   [facility] Management Direct Dial [phone]  Fax [phone] [email or web address]

## 2018-02-13 ENCOUNTER — Encounter: Payer: Self-pay | Admitting: *Deleted

## 2018-02-13 NOTE — Congregational Nurse Program (Signed)
  Dept: [phone]   Congregational Nurse Program Note  Date of Encounter: 02/11/2018 Past Medical History: Past Medical History:  Diagnosis Date  . Allergy   . Aortic atherosclerosis ([place])   . Diabetes mellitus   . Diverticulosis   . Dyspepsia   . Fecal incontinence   . Gastritis   . GERD (gastroesophageal reflux disease)   . 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 Hemorrhoids   . Hiatal hernia   . Hyperlipidemia   . Hyperlipidemia   . Hypertension   . Hypothyroidism   . IBS (irritable bowel syndrome)   . Iron overload 07/13/2016  . 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 Osteoporosis   . UTI (lower urinary tract infection)   . Vitamin D deficiency     Encounter Details: CNP Questionnaire - 02/11/18 1000      Questionnaire   Patient Status  Immigrant    Race  Asian    Location Patient Served At  Not Applicable    Insurance  Medicare    Uninsured  Not Applicable    Food  No food insecurities    Housing/Utilities  No permanent housing    Transportation  No transportation needs    Interpersonal Safety  Yes, feel physically and emotionally safe where you currently live    Medication  No medication insecurities    Medical Provider  Yes    Referrals  Not Applicable    ED Visit Averted  Not Applicable    Life-Saving Intervention Made  Not Applicable       c/o diarrhea and stomach upset, UTI sx. On amoxicillin 500mg , but did not know how long. Bid x7days and reminded to finish them all. Will f/u

## 2018-03-17 DIAGNOSIS — D649 Anemia, unspecified: Secondary | ICD-10-CM | POA: Diagnosis not present

## 2018-03-17 DIAGNOSIS — E1165 Type 2 diabetes mellitus with hyperglycemia: Secondary | ICD-10-CM | POA: Diagnosis not present

## 2018-03-17 DIAGNOSIS — I10 Essential (primary) hypertension: Secondary | ICD-10-CM | POA: Diagnosis not present

## 2018-03-17 DIAGNOSIS — M810 Age-related osteoporosis without current pathological fracture: Secondary | ICD-10-CM | POA: Diagnosis not present

## 2018-03-21 DIAGNOSIS — H903 Sensorineural hearing loss, bilateral: Secondary | ICD-10-CM | POA: Diagnosis not present

## 2018-03-21 DIAGNOSIS — H9313 Tinnitus, bilateral: Secondary | ICD-10-CM | POA: Diagnosis not present

## 2018-03-22 ENCOUNTER — Encounter: Payer: Self-pay | Admitting: *Deleted

## 2018-03-22 NOTE — Congregational Nurse Program (Unsigned)
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  Ok to one year check up but if needed call them.  pt. slippered on last Saturday because rain, sparained Lt ankle but no other injury noted.

## 2018-03-24 DIAGNOSIS — E7800 Pure hypercholesterolemia, unspecified: Secondary | ICD-10-CM | POA: Diagnosis not present

## 2018-03-24 DIAGNOSIS — H9190 Unspecified hearing loss, unspecified ear: Secondary | ICD-10-CM | POA: Diagnosis not present

## 2018-03-24 DIAGNOSIS — I10 Essential (primary) hypertension: Secondary | ICD-10-CM | POA: Diagnosis not present

## 2018-03-24 DIAGNOSIS — E1165 Type 2 diabetes mellitus with hyperglycemia: Secondary | ICD-10-CM | POA: Diagnosis not present

## 2018-03-24 DIAGNOSIS — D649 Anemia, unspecified: Secondary | ICD-10-CM | POA: Diagnosis not present

## 2018-03-25 ENCOUNTER — Encounter: Payer: Self-pay | Admitting: *Deleted

## 2018-03-25 NOTE — Congregational Nurse Program (Unsigned)
  Dept: [facility] Nurse Program Note  Date of Encounter: 03/25/2018  Past Medical History: Past Medical History:  Diagnosis Date  . Allergy   . Aortic atherosclerosis ([place])   . Diabetes mellitus   . Diverticulosis   . Dyspepsia   . Fecal incontinence   . Gastritis   . GERD (gastroesophageal reflux disease)   . 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 Hemorrhoids   . Hiatal hernia   . Hyperlipidemia   . Hyperlipidemia   . Hypertension   . Hypothyroidism   . IBS (irritable bowel syndrome)   . Iron overload 07/13/2016  . 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 Osteoporosis   . UTI (lower urinary tract infection)   . Vitamin D deficiency     Encounter Details: CNP Questionnaire - 03/24/18 0900      Questionnaire   Patient Status  Immigrant    Race  Asian    Location Patient Served At  Not Applicable    Insurance  Medicare    Uninsured  Not Applicable    Food  No food insecurities    Housing/Utilities  No permanent housing    Transportation  Yes, need transportation assistance    Interpersonal Safety  Yes, feel physically and emotionally safe where you currently live    Medication  No medication insecurities    Medical Provider  Yes    Referrals  Not Applicable    ED Visit Averted  Not Applicable    Life-Saving Intervention Made  Not Applicable      07/01/3788 Visited Dr. Julianne Rice clinic. Saw [place],PA.  lab was ok except LDH is high and HgbA1C is increased 5.8 to 6.1 Will see pt in 6 month.  Requested janumet 3 month supply.

## 2018-04-11 ENCOUNTER — Encounter: Payer: Self-pay | Admitting: *Deleted

## 2018-04-11 NOTE — Congregational Nurse Program (Signed)
  Dept: [facility] Nurse Program Note  Date of Encounter: 04/10/2018  Past Medical History: Past Medical History:  Diagnosis Date  . Allergy   . Aortic atherosclerosis ([place])   . Diabetes mellitus   . Diverticulosis   . Dyspepsia   . Fecal incontinence   . Gastritis   . GERD (gastroesophageal reflux disease)   . 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 Hemorrhoids   . Hiatal hernia   . Hyperlipidemia   . Hyperlipidemia   . Hypertension   . Hypothyroidism   . IBS (irritable bowel syndrome)   . Iron overload 07/13/2016  . 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 Osteoporosis   . UTI (lower urinary tract infection)   . Vitamin D deficiency     Encounter Details: CNP Questionnaire - 04/10/18 2000      Questionnaire   Patient Status  Immigrant    Race  Asian    Location Patient Served At  Not Applicable   [place]  Medicare    Uninsured  Not Applicable    Food  No food insecurities    Housing/Utilities  No permanent housing    Transportation  Yes, need transportation assistance    Interpersonal Safety  Yes, feel physically and emotionally safe where you currently live    Medication  No medication insecurities    Medical Provider  Yes    Referrals  Not Applicable    ED Visit Averted  Not Applicable    Life-Saving Intervention Made  Not Applicable      Lt Eye red, no pain or discomfort. Stated use eye drop (not prescribed) will f/u

## 2018-05-22 DIAGNOSIS — H353131 Nonexudative age-related macular degeneration, bilateral, early dry stage: Secondary | ICD-10-CM | POA: Diagnosis not present

## 2018-05-22 DIAGNOSIS — H40033 Anatomical narrow angle, bilateral: Secondary | ICD-10-CM | POA: Diagnosis not present

## 2018-05-22 DIAGNOSIS — E119 Type 2 diabetes mellitus without complications: Secondary | ICD-10-CM | POA: Diagnosis not present

## 2018-05-22 DIAGNOSIS — H40013 Open angle with borderline findings, low risk, bilateral: Secondary | ICD-10-CM | POA: Diagnosis not present

## 2018-05-23 ENCOUNTER — Encounter: Payer: Self-pay | Admitting: *Deleted

## 2018-05-23 NOTE — Congregational Nurse Program (Unsigned)
  Dept: [facility] Nurse Program Note  Date of Encounter: 05/23/2018  Past Medical History: Past Medical History:  Diagnosis Date  . Allergy   . Aortic atherosclerosis ([place])   . Diabetes mellitus   . Diverticulosis   . Dyspepsia   . Fecal incontinence   . Gastritis   . GERD (gastroesophageal reflux disease)   . 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 Hemorrhoids   . Hiatal hernia   . Hyperlipidemia   . Hyperlipidemia   . Hypertension   . Hypothyroidism   . IBS (irritable bowel syndrome)   . Iron overload 07/13/2016  . 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 Osteoporosis   . UTI (lower urinary tract infection)   . Vitamin D deficiency     Encounter Details: CNP Questionnaire - 05/22/18 1000      Questionnaire   Patient Status  Immigrant    Race  Asian    Location Patient Served At  Not Applicable   Dr's office   Insurance  Medicare    Uninsured  Not Applicable    Food  No food insecurities    Housing/Utilities  No permanent housing    Transportation  No transportation needs    Interpersonal Safety  Yes, feel physically and emotionally safe where you currently live    Medication  No medication insecurities    Medical Provider  Yes    Referrals  Not Applicable    ED Visit Averted  Not Applicable    Life-Saving Intervention Made  Not Applicable      6 month f/u with Dr Kathlen Mody, eye check up, No changes cataract or glaucoma need 1year appointment. Spent 2hours

## 2018-06-10 ENCOUNTER — Encounter: Payer: Self-pay | Admitting: *Deleted

## 2018-06-10 NOTE — Congregational Nurse Program (Unsigned)
06/06/2018 Pt called me that Janumet med run out soon, ordered janumet via online, also left messages to Dr.Feimster's office to renew thyroxin, losartan, opramazole. 06/07/2018 Got message from [place] office that renew all of meds requested via [employer]. Notified pt that meds will arrive this Friday or Monday 06/12/2018 Pt is heathy,  Said walking around apt, otherwise stay home . Will check on her daily via phone.

## 2018-06-10 NOTE — Congregational Nurse Program (Unsigned)
  Dept: [facility] Nurse Program Note  Date of Encounter: 05/24/2018 Past Medical History: Past Medical History:  Diagnosis Date  . Allergy   . Aortic atherosclerosis ([place])   . Diabetes mellitus   . Diverticulosis   . Dyspepsia   . Fecal incontinence   . Gastritis   . GERD (gastroesophageal reflux disease)   . 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 Hemorrhoids   . Hiatal hernia   . Hyperlipidemia   . Hyperlipidemia   . Hypertension   . Hypothyroidism   . IBS (irritable bowel syndrome)   . Iron overload 07/13/2016  . 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 Osteoporosis   . UTI (lower urinary tract infection)   . Vitamin D deficiency     Encounter Details: CNP Questionnaire - 05/24/18 1800      Questionnaire   Patient Status  Immigrant    Race  Asian    Location Patient Served At  Not Applicable   home visit   Insurance  Medicare    Uninsured  Not Applicable    Food  No food insecurities    Housing/Utilities  No permanent housing    Transportation  No transportation needs    Interpersonal Safety  Yes, feel physically and emotionally safe where you currently live    Medication  No medication insecurities    Medical Provider  Yes    Referrals  Not Applicable    ED Visit Averted  Not Applicable    Life-Saving Intervention Made  Not Applicable      pt.need cbg strips. Ordered and delivered. Check pt's status  Due to self quarantine from prevent corona virus infection, no c/o voiced. Will check up daily via phone.

## 2018-09-14 DIAGNOSIS — E785 Hyperlipidemia, unspecified: Secondary | ICD-10-CM | POA: Diagnosis not present

## 2018-09-14 DIAGNOSIS — E538 Deficiency of other specified B group vitamins: Secondary | ICD-10-CM | POA: Diagnosis not present

## 2018-09-14 DIAGNOSIS — I10 Essential (primary) hypertension: Secondary | ICD-10-CM | POA: Diagnosis not present

## 2018-09-14 DIAGNOSIS — E039 Hypothyroidism, unspecified: Secondary | ICD-10-CM | POA: Diagnosis not present

## 2018-09-14 DIAGNOSIS — R739 Hyperglycemia, unspecified: Secondary | ICD-10-CM | POA: Diagnosis not present

## 2018-09-22 DIAGNOSIS — E7800 Pure hypercholesterolemia, unspecified: Secondary | ICD-10-CM | POA: Diagnosis not present

## 2018-09-22 DIAGNOSIS — E1165 Type 2 diabetes mellitus with hyperglycemia: Secondary | ICD-10-CM | POA: Diagnosis not present

## 2018-09-22 DIAGNOSIS — I10 Essential (primary) hypertension: Secondary | ICD-10-CM | POA: Diagnosis not present

## 2018-09-22 DIAGNOSIS — E039 Hypothyroidism, unspecified: Secondary | ICD-10-CM | POA: Diagnosis not present

## 2018-09-23 ENCOUNTER — Encounter: Payer: Self-pay | Admitting: *Deleted

## 2018-09-23 NOTE — Congregational Nurse Program (Signed)
  Dept: [facility] Nurse Program Note  Date of Encounter: 09/23/2018  Past Medical History: Past Medical History:  Diagnosis Date  . Allergy   . Aortic atherosclerosis ([place])   . Diabetes mellitus   . Diverticulosis   . Dyspepsia   . Fecal incontinence   . Gastritis   . GERD (gastroesophageal reflux disease)   . 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 Hemorrhoids   . Hiatal hernia   . Hyperlipidemia   . Hyperlipidemia   . Hypertension   . Hypothyroidism   . IBS (irritable bowel syndrome)   . Iron overload 07/13/2016  . 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 Osteoporosis   . UTI (lower urinary tract infection)   . Vitamin D deficiency     Encounter Details: CNP Questionnaire - 09/22/18 1100      Questionnaire   Patient Status  Immigrant    Race  Asian    Location Patient Served At  Not Applicable   Dr's office   Insurance  Medicare    Uninsured  Not Applicable    Food  No food insecurities    Housing/Utilities  No permanent housing    Transportation  No transportation needs    Interpersonal Safety  Yes, feel physically and emotionally safe where you currently live    Medication  No medication insecurities    Medical Provider  Yes    Referrals  Not Applicable    ED Visit Averted  Not Applicable    Life-Saving Intervention Made  Not Applicable      f/u with Dr. Maudie Mercury.  Labs review, start pravastatin 40mg  for high cholesterol. If weight less than 110lbs, call back per MD. F/u 51month later.

## 2018-11-16 DIAGNOSIS — M25562 Pain in left knee: Secondary | ICD-10-CM | POA: Diagnosis not present

## 2018-11-16 DIAGNOSIS — I10 Essential (primary) hypertension: Secondary | ICD-10-CM | POA: Diagnosis not present

## 2018-11-16 DIAGNOSIS — M810 Age-related osteoporosis without current pathological fracture: Secondary | ICD-10-CM | POA: Diagnosis not present

## 2018-11-18 ENCOUNTER — Encounter: Payer: Self-pay | Admitting: *Deleted

## 2018-11-18 ENCOUNTER — Telehealth: Payer: Self-pay | Admitting: *Deleted

## 2018-11-18 NOTE — Congregational Nurse Program (Unsigned)
  Dept: [facility] Nurse Program Note  Date of Encounter: 11/16/2018 Past Medical History: Past Medical History:  Diagnosis Date  . Allergy   . Aortic atherosclerosis ([place])   . Diabetes mellitus   . Diverticulosis   . Dyspepsia   . Fecal incontinence   . Gastritis   . GERD (gastroesophageal reflux disease)   . 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 Hemorrhoids   . Hiatal hernia   . Hyperlipidemia   . Hyperlipidemia   . Hypertension   . Hypothyroidism   . IBS (irritable bowel syndrome)   . Iron overload 07/13/2016  . 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 Osteoporosis   . UTI (lower urinary tract infection)   . Vitamin D deficiency     Encounter Details: CNP Questionnaire - 11/16/18 1200      Questionnaire   Patient Status  Immigrant    Race  Asian    Location Patient Served At  --   Clinic, [facility]  Medicare    Uninsured  Not Applicable    Food  No food insecurities    Housing/Utilities  No permanent housing    Transportation  Yes, need transportation assistance    Interpersonal Safety  Yes, feel physically and emotionally safe where you currently live    Medication  No medication insecurities    Medical Provider  Yes    Referrals  Not Applicable    ED Visit Averted  Not Applicable    Life-Saving Intervention Made  Not Applicable      visit home due to c/o acute knee pain Lt. While she was walking, fell x2 Made an appointment with Fredda Hammed PA @ Dr. Julianne Rice office @ 10am, knee x-ray done, steroid shot given by Nurse. Will f/u in the morning and call back to PA. Total spent time 4hrs.

## 2018-11-18 NOTE — Congregational Nurse Program (Unsigned)
  11/10/2018 Pt. Got Flu shot at [employer], needed ride and translate. Had flu shot by pharmacist without difficulty. Will f/u

## 2018-11-20 ENCOUNTER — Telehealth: Payer: Self-pay | Admitting: *Deleted

## 2018-11-22 ENCOUNTER — Telehealth: Payer: Self-pay | Admitting: *Deleted

## 2018-11-22 NOTE — Telephone Encounter (Signed)
Check up pt. everyday  No pain at Lt Knee. No c/o voiced.

## 2018-11-24 ENCOUNTER — Encounter: Payer: Self-pay | Admitting: *Deleted

## 2018-11-24 NOTE — Congregational Nurse Program (Unsigned)
  Dept: [facility] Nurse Program Note  Date of Encounter: 11/24/2018  Past Medical History: Past Medical History:  Diagnosis Date  . Allergy   . Aortic atherosclerosis ([place])   . Diabetes mellitus   . Diverticulosis   . Dyspepsia   . Fecal incontinence   . Gastritis   . GERD (gastroesophageal reflux disease)   . 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 Hemorrhoids   . Hiatal hernia   . Hyperlipidemia   . Hyperlipidemia   . Hypertension   . Hypothyroidism   . IBS (irritable bowel syndrome)   . Iron overload 07/13/2016  . 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 Osteoporosis   . UTI (lower urinary tract infection)   . Vitamin D deficiency     Encounter Details: c/o Lt Knee pain coming back, unable to walk. Told her need to go to ER due to need check with orthopedic Dr. On weekend  Unable to see primary Dr. Also if primary Dr refer to orthopedic Dr, it will takes time to see them. Pt refused to go ER said will take Ibuprofen, also applied  Volteral cream . Reminded her be careful to prevent fall.  Do not go out without cane. Will f/u .

## 2018-11-30 ENCOUNTER — Encounter: Payer: Self-pay | Admitting: *Deleted

## 2018-11-30 NOTE — Congregational Nurse Program (Unsigned)
Late entry : Called Dr's office back and forth several times for [facility] regarding Lt Knee pain, Dr. Maudie Mercury Ordered Tramadol Q 6H PRN #20 tab, referraled Rheumatology MD for Knee pain.  Met Pt at [facility] to pick up tramadol, instruction given to pt. And scheduled appointment. Pt. Was limping with cane.  Will f/u

## 2018-12-04 DIAGNOSIS — M25462 Effusion, left knee: Secondary | ICD-10-CM | POA: Diagnosis not present

## 2018-12-04 DIAGNOSIS — M170 Bilateral primary osteoarthritis of knee: Secondary | ICD-10-CM | POA: Diagnosis not present

## 2018-12-04 DIAGNOSIS — M25562 Pain in left knee: Secondary | ICD-10-CM | POA: Diagnosis not present

## 2018-12-08 ENCOUNTER — Encounter: Payer: Self-pay | Admitting: *Deleted

## 2018-12-08 ENCOUNTER — Other Ambulatory Visit: Payer: Self-pay | Admitting: Rheumatology

## 2018-12-08 ENCOUNTER — Telehealth: Payer: Self-pay | Admitting: *Deleted

## 2018-12-08 DIAGNOSIS — M25562 Pain in left knee: Secondary | ICD-10-CM

## 2018-12-08 DIAGNOSIS — M25462 Effusion, left knee: Secondary | ICD-10-CM

## 2018-12-08 NOTE — Congregational Nurse Program (Unsigned)
  Dept: [place] Nurse Program Note  Date of Encounter: 12/04/2018 Past Medical History: Past Medical History:  Diagnosis Date  . Allergy   . Aortic atherosclerosis ([place])   . Diabetes mellitus   . Diverticulosis   . Dyspepsia   . Fecal incontinence   . Gastritis   . GERD (gastroesophageal reflux disease)   . 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 Hemorrhoids   . Hiatal hernia   . Hyperlipidemia   . Hyperlipidemia   . Hypertension   . Hypothyroidism   . IBS (irritable bowel syndrome)   . Iron overload 07/13/2016  . 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 Osteoporosis   . UTI (lower urinary tract infection)   . Vitamin D deficiency     Encounter Details: CNP Questionnaire - 12/04/18 1000      Questionnaire   Race  Asian    Location Patient Served At  Not Applicable   [facility]   Insurance  Medicare    Uninsured  Not Applicable    Food  No food insecurities    Housing/Utilities  No permanent housing    Transportation  No transportation needs    Interpersonal Safety  Yes, feel physically and emotionally safe where you currently live    Medication  No medication insecurities    Medical Provider  Yes    Referrals  Not Applicable   need orthopedic dr   ED Visit Averted  Not Applicable    Life-Saving Intervention Made  Not Applicable      Visited Dr. Kathlene November for Lt Knee pain.  Dr. Kathlene November did ultra sonogram for Lt Knee and gave steroid shot to Knee, also stated need MRI and need to see orthopedic Dr. Lorain Childes meniscus tears. But need approve MRI by insurance. Will call back for appointment for MRI. Explain pt. For the process of MRI and Dr's appointment for orthopedic Dr. Aletha Halim check her daily .   Emphasized use cane and prevent fall. Took 3hrs spent to visit Dr.today

## 2018-12-12 ENCOUNTER — Encounter: Payer: Self-pay | Admitting: *Deleted

## 2018-12-12 NOTE — Congregational Nurse Program (Signed)
  Dept: [place] [facility] Nurse Program Note  Date of Encounter: 12/11/2018  Past Medical History: Past Medical History:  Diagnosis Date  . Allergy   . Aortic atherosclerosis ([place])   . Diabetes mellitus   . Diverticulosis   . Dyspepsia   . Fecal incontinence   . Gastritis   . GERD (gastroesophageal reflux disease)   . 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 Hemorrhoids   . Hiatal hernia   . Hyperlipidemia   . Hyperlipidemia   . Hypertension   . Hypothyroidism   . IBS (irritable bowel syndrome)   . Iron overload 07/13/2016  . 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 Osteoporosis   . UTI (lower urinary tract infection)   . Vitamin D deficiency     Encounter Details: CNP Questionnaire - 12/11/18 1030      Questionnaire   Patient Status  Immigrant    Race  Asian    Location Patient Served At  --   [facility] [employer]    Uninsured  Not Applicable    Food  No food insecurities    Housing/Utilities  No permanent housing    Transportation  No transportation needs    Interpersonal Safety  Yes, feel physically and emotionally safe where you currently live    Medication  No medication insecurities    Medical Provider  Yes    Referrals  Not Applicable    ED Visit Averted  Not Applicable    Life-Saving Intervention Made  Not Applicable      received phne call from [facility] to pick up med. During weekend, pt & her son went to [facility] to refill tramadol med. Pick it up today with pt presence.  Still waiting for approve MRI from Medicare to eval. Lt Knee.  Bought cain with [facility] at [employer]. Will f/u

## 2018-12-13 ENCOUNTER — Encounter: Payer: Self-pay | Admitting: *Deleted

## 2018-12-13 NOTE — Congregational Nurse Program (Signed)
  Dept: [place] Nurse Program Note  Date of Encounter: 12/13/2018  Past Medical History: Past Medical History:  Diagnosis Date  . Allergy   . Aortic atherosclerosis ([place])   . Diabetes mellitus   . Diverticulosis   . Dyspepsia   . Fecal incontinence   . Gastritis   . GERD (gastroesophageal reflux disease)   . 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 Hemorrhoids   . Hiatal hernia   . Hyperlipidemia   . Hyperlipidemia   . Hypertension   . Hypothyroidism   . IBS (irritable bowel syndrome)   . Iron overload 07/13/2016  . 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 Osteoporosis   . UTI (lower urinary tract infection)   . Vitamin D deficiency     Encounter Details: CNP Questionnaire - 12/13/18 1817      Questionnaire   Patient Status  Immigrant    Race  Asian    Location Patient Served At  Not Applicable   home visit   Insurance  Medicare    Uninsured  Not Applicable    Food  No food insecurities    Housing/Utilities  No permanent housing    Interpersonal Safety  Yes, feel physically and emotionally safe where you currently live    Medication  No medication insecurities    Medical Provider  Yes    Referrals  Not Applicable    ED Visit Averted  Not Applicable    Life-Saving Intervention Made  Not Applicable     pt called and crying nauseated since lastnight, and nausea& vomiting. Could not eat due to nauseated and vomited x3 water contents. Denied stomach pain, said nothing ate today except little piece of banana ahd vomited too, no medicine today because of nausea and throbbing HA.  Pt took tramadol 1 tab last night and during the night problem occur. Visited home  Gap food for her. Gave her antiacid med and  Gave some porrige and 1 ES tylenol for knee pain and HA.  symptom got better when I left pt's place. Told her not take tramadol. Take tylenol or Ibuprofen for pain control. Home visit  3;40p-1800. Will f/u

## 2018-12-13 NOTE — Congregational Nurse Program (Signed)
  Dept: [facility] Nurse Program Note  Date of Encounter: 12/13/2018  Past Medical History: Past Medical History:  Diagnosis Date  . Allergy   . Aortic atherosclerosis ([place])   . Diabetes mellitus   . Diverticulosis   . Dyspepsia   . Fecal incontinence   . Gastritis   . GERD (gastroesophageal reflux disease)   . 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 Hemorrhoids   . Hiatal hernia   . Hyperlipidemia   . Hyperlipidemia   . Hypertension   . Hypothyroidism   . IBS (irritable bowel syndrome)   . Iron overload 07/13/2016  . 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 Osteoporosis   . UTI (lower urinary tract infection)   . Vitamin D deficiency     Encounter Details: CNP Questionnaire - 12/13/18 2236      Questionnaire   Patient Status  Immigrant    Race  Asian    Location Patient Served At  Not Applicable   home visit #2   Insurance  Medicare    Uninsured  Not Applicable    Food  No food insecurities    Housing/Utilities  No permanent housing    Transportation  No transportation needs    Interpersonal Safety  Yes, feel physically and emotionally safe where you currently live    Medication  No medication insecurities    Medical Provider  Yes    Referrals  Not Applicable    ED Visit Averted  Not Applicable    Life-Saving Intervention Made  Not Applicable     checked pt, c/o nausea when get up. Walk with cane, when nauseated HA came along. Knee pain. Pt just took 2 tylenol before I arrival pt's home. She is wait till morning, if not better will see MD. I sent Dr. Maudie Mercury regarding medicine side effect and requested for nausea medicine through patient portal. Will check pt. In am  Emotional support

## 2018-12-14 ENCOUNTER — Encounter: Payer: Self-pay | Admitting: *Deleted

## 2018-12-14 ENCOUNTER — Telehealth: Payer: Self-pay | Admitting: *Deleted

## 2018-12-14 DIAGNOSIS — R110 Nausea: Secondary | ICD-10-CM | POA: Diagnosis not present

## 2018-12-14 DIAGNOSIS — I10 Essential (primary) hypertension: Secondary | ICD-10-CM | POA: Diagnosis not present

## 2018-12-14 NOTE — Congregational Nurse Program (Signed)
  Dept: [facility] Nurse Program Note  Date of Encounter: 12/14/2018  Past Medical History: Past Medical History:  Diagnosis Date  . Allergy   . Aortic atherosclerosis ([place])   . Diabetes mellitus   . Diverticulosis   . Dyspepsia   . Fecal incontinence   . Gastritis   . GERD (gastroesophageal reflux disease)   . 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 Hemorrhoids   . Hiatal hernia   . Hyperlipidemia   . Hyperlipidemia   . Hypertension   . Hypothyroidism   . IBS (irritable bowel syndrome)   . Iron overload 07/13/2016  . 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 Osteoporosis   . UTI (lower urinary tract infection)   . Vitamin D deficiency     Encounter Details: CNP Questionnaire - 12/14/18 1500      Questionnaire   Patient Status  Immigrant    Race  Asian    Location Patient Served At  Not Applicable   clinic   Insurance  Medicare    Uninsured  Not Applicable    Food  No food insecurities    Housing/Utilities  No permanent housing    Transportation  Yes, need transportation assistance   could not drive today, need ride   Interpersonal Safety  Yes, feel physically and emotionally safe where you currently live    Medication  No medication insecurities    Medical Provider  Yes    Referrals  Not Applicable    ED Visit Averted  Not Applicable    Life-Saving Intervention Made  Not Applicable      pick up pt from home to visit PA"s clinc.  Gave phenergan IM for nausea. And pick up phenergan pill prn med. Only es tylenol for pain now then nausea better, will assess pain med.  PA said proved MRI, she will send to someone with MRI image center to check . Ride given back to home, made sure pt ate some food and reminded her drink enough water or Gatorade. Will f/u tomorrow.spend 3hrs.

## 2018-12-14 NOTE — Telephone Encounter (Signed)
Check pt thi am, no better for nausea and pain.  Made an appoitntment with [place] PA @ 2 pm. Notified pt.

## 2018-12-15 ENCOUNTER — Telehealth: Payer: Self-pay | Admitting: *Deleted

## 2018-12-15 NOTE — Telephone Encounter (Signed)
Check pt that sx of nausea and pain, pt said felt much better.

## 2018-12-19 ENCOUNTER — Encounter: Payer: Self-pay | Admitting: *Deleted

## 2018-12-19 DIAGNOSIS — M8589 Other specified disorders of bone density and structure, multiple sites: Secondary | ICD-10-CM | POA: Diagnosis not present

## 2018-12-19 DIAGNOSIS — E039 Hypothyroidism, unspecified: Secondary | ICD-10-CM | POA: Diagnosis not present

## 2018-12-19 DIAGNOSIS — E1165 Type 2 diabetes mellitus with hyperglycemia: Secondary | ICD-10-CM | POA: Diagnosis not present

## 2018-12-19 NOTE — Congregational Nurse Program (Signed)
  Dept: [facility] Nurse Program Note  Date of Encounter: 12/19/2018  Past Medical History: Past Medical History:  Diagnosis Date  . Allergy   . Aortic atherosclerosis ([place])   . Diabetes mellitus   . Diverticulosis   . Dyspepsia   . Fecal incontinence   . Gastritis   . GERD (gastroesophageal reflux disease)   . 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 Hemorrhoids   . Hiatal hernia   . Hyperlipidemia   . Hyperlipidemia   . Hypertension   . Hypothyroidism   . IBS (irritable bowel syndrome)   . Iron overload 07/13/2016  . 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 Osteoporosis   . UTI (lower urinary tract infection)   . Vitamin D deficiency     Encounter Details: CNP Questionnaire - 12/19/18 0751      Questionnaire   Race  Asian    Location Patient Served At  Not Applicable   Dr's clinic for labs and bone dense test   Insurance  Medicare    Uninsured  Not Applicable    Food  No food insecurities    Housing/Utilities  No permanent housing    Transportation  Yes, need transportation assistance   ride given from home to [place] and back home   Interpersonal Safety  Yes, feel physically and emotionally safe where you currently live    Medication  No medication insecurities    Medical Provider  Yes    Referrals  Not Applicable    ED Visit Averted  Not Applicable    Life-Saving Intervention Made  Not Applicable       Reminded her to npo for labs yesterday. Pick her up from home to clinic, labs done and Bone Dense test done for Friday visit  By Dr. Maudie Mercury.  Stomach pain is gone. Having diarrhea now after eat certain food. [facility] called to make an appointment, on 01/05/19 0910am.  Will f/u

## 2018-12-20 ENCOUNTER — Encounter: Payer: Self-pay | Admitting: *Deleted

## 2018-12-20 NOTE — Congregational Nurse Program (Signed)
  Dept: [facility] Nurse Program Note  Date of Encounter: 12/20/2018  Past Medical History: Past Medical History:  Diagnosis Date  . Allergy   . Aortic atherosclerosis ([place])   . Diabetes mellitus   . Diverticulosis   . Dyspepsia   . Fecal incontinence   . Gastritis   . GERD (gastroesophageal reflux disease)   . 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 Hemorrhoids   . Hiatal hernia   . Hyperlipidemia   . Hyperlipidemia   . Hypertension   . Hypothyroidism   . IBS (irritable bowel syndrome)   . Iron overload 07/13/2016  . 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 Osteoporosis   . UTI (lower urinary tract infection)   . Vitamin D deficiency     Encounter Details:  c/o diarrhea after having food. Another church member brought food and peptobismol, gatoraidsappointment  When I get there, felt better, no more diarrhea per pt. bp 151/79 and CBG 197. recommended Immodium if recurrent diarrhea but not exceed 4 tab.  Pt has another Dr's following up appointment at [facility]

## 2018-12-21 ENCOUNTER — Telehealth: Payer: Self-pay | Admitting: *Deleted

## 2018-12-21 NOTE — Telephone Encounter (Signed)
Will meet pt at Dr Terhaar"s clinic tomorrow at 8:50 am

## 2018-12-22 ENCOUNTER — Encounter: Payer: Self-pay | Admitting: *Deleted

## 2018-12-22 DIAGNOSIS — I10 Essential (primary) hypertension: Secondary | ICD-10-CM | POA: Diagnosis not present

## 2018-12-22 DIAGNOSIS — E785 Hyperlipidemia, unspecified: Secondary | ICD-10-CM | POA: Diagnosis not present

## 2018-12-22 DIAGNOSIS — R110 Nausea: Secondary | ICD-10-CM | POA: Diagnosis not present

## 2018-12-22 DIAGNOSIS — E039 Hypothyroidism, unspecified: Secondary | ICD-10-CM | POA: Diagnosis not present

## 2018-12-22 DIAGNOSIS — E1165 Type 2 diabetes mellitus with hyperglycemia: Secondary | ICD-10-CM | POA: Diagnosis not present

## 2018-12-22 NOTE — Congregational Nurse Program (Signed)
  Dept: [facility] Nurse Program Note  Date of Encounter: 12/22/2018  Past Medical History: Past Medical History:  Diagnosis Date  . Allergy   . Aortic atherosclerosis ([place])   . Diabetes mellitus   . Diverticulosis   . Dyspepsia   . Fecal incontinence   . Gastritis   . GERD (gastroesophageal reflux disease)   . 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 Hemorrhoids   . Hiatal hernia   . Hyperlipidemia   . Hyperlipidemia   . Hypertension   . Hypothyroidism   . IBS (irritable bowel syndrome)   . Iron overload 07/13/2016  . 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 Osteoporosis   . UTI (lower urinary tract infection)   . Vitamin D deficiency     Encounter Details:  met pt at Dr's office follow up 4 month  Lab results good per MD, Bone dense test shows osteopenia, spine was good. Need 2 yrs check uo for bone dense, and another f/u in 4 month.  requested for IV TX ( daughter asked due to loosing wt and unable to eat), Dr. Michela Pitcher she was not dehydrated. BUN is good. Pt said no diarrhea since 10/7 but scared to eat if she is having diarrhea again.  No energy she said, but steady gait with cane.  Ride pt to home. No other c/o voiced. Talked to her daughter who is coming 10/13 from [place] .

## 2018-12-23 ENCOUNTER — Encounter (HOSPITAL_COMMUNITY): Payer: Self-pay | Admitting: Emergency Medicine

## 2018-12-23 ENCOUNTER — Other Ambulatory Visit: Payer: Self-pay

## 2018-12-23 ENCOUNTER — Telehealth: Payer: Self-pay | Admitting: *Deleted

## 2018-12-23 ENCOUNTER — Ambulatory Visit (HOSPITAL_COMMUNITY)
Admission: EM | Admit: 2018-12-23 | Discharge: 2018-12-23 | Disposition: A | Discharge status: Home / Self Care | Payer: Medicare Other | Attending: Family Medicine | Admitting: Family Medicine

## 2018-12-23 DIAGNOSIS — I10 Essential (primary) hypertension: Secondary | ICD-10-CM

## 2018-12-23 DIAGNOSIS — R319 Hematuria, unspecified: Secondary | ICD-10-CM | POA: Diagnosis not present

## 2018-12-23 DIAGNOSIS — N390 Urinary tract infection, site not specified: Secondary | ICD-10-CM | POA: Insufficient documentation

## 2018-12-23 LAB — POCT URINALYSIS DIP (DEVICE)
Bilirubin Urine: NEGATIVE
Glucose, UA: NEGATIVE mg/dL
Ketones, ur: NEGATIVE mg/dL
Nitrite: POSITIVE — AB
Protein, ur: 100 mg/dL — AB
Specific Gravity, Urine: 1.01 (ref 1.005–1.030)
Urobilinogen, UA: 0.2 mg/dL (ref 0.0–1.0)
pH: 7 (ref 5.0–8.0)

## 2018-12-23 MED ORDER — CEPHALEXIN 500 MG PO CAPS: 500.0000 mg | ORAL_CAPSULE | Freq: Two times a day (BID) | ORAL | 0 refills | Status: DC

## 2018-12-23 NOTE — Discharge Instructions (Addendum)
PLEASE MONITOR YOUR BLOOD PRESSURE AND KEEP A LOG OF THEM. IF THEY ARE 140/90 OR HIGHER, YOU NEED TO SEE YOUR DOCTOR RIGHT AWAY.   WE WILL CALL YOU WITH THE CULTURE RESULTS IF WE NEED TO CHANGE THE ANTIBIOTIC BECAUSE IT IS RESISTANT TO WHAT YOU WERE PRESCRIBED.

## 2018-12-23 NOTE — Telephone Encounter (Signed)
Check up after visit urgent care center today. On antibiotic BID X5 days. Remains UTI sx but little better per Pt. Remind pt take med with food. Will check up tomorrow.

## 2018-12-23 NOTE — ED Provider Notes (Signed)
[facility]    CSN: [id number] Arrival date & time: 12/23/18  1250 *     History   Chief Complaint Chief Complaint  Patient presents with  . Urinary Retention    HPI Samariyah VIVIE GEDDES is a 78 y.o. female. who presents with onset of urgency and frequency and sensation of hotness as she passes her urine, but only passing a few drops. Denies fever, chills or flank pain. Last UTI 1 y ago.     Past Medical History:  Diagnosis Date  . Allergy   . Aortic atherosclerosis ([place])   . Diabetes mellitus   . Diverticulosis   . Dyspepsia   . Fecal incontinence   . Gastritis   . GERD (gastroesophageal reflux disease)   . 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 Hemorrhoids   . Hiatal hernia   . Hyperlipidemia   . Hyperlipidemia   . Hypertension   . Hypothyroidism   . IBS (irritable bowel syndrome)   . Iron overload 07/13/2016  . 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 Osteoporosis   . UTI (lower urinary tract infection)   . Vitamin D deficiency     Patient Active Problem List   Diagnosis Date Noted  . 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 Iron overload 07/13/2016  . Knee pain, bilateral 07/06/2016  . Upset stomach 05/18/2016  . 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 Ear ringing 02/23/2016  . Fecal incontinence 01/03/2015  . Nausea without vomiting 01/03/2015  . Periumbilical abdominal pain 01/03/2015  . HTN (hypertension) 01/25/2011  . Hyperlipidemia 01/25/2011  . Diabetes mellitus 01/25/2011  . GERD (gastroesophageal reflux disease) 01/25/2011  . Osteoporosis 01/25/2011  . Hypothyroidism 01/25/2011    Past Surgical History:  Procedure Laterality Date  . COLONOSCOPY      OB History    Gravida  5   Para  3   Term      Preterm      AB  2   Living  3     SAB      TAB  2   Ectopic      Multiple      Live Births               Home Medications     Prior to Admission medications   Medication Sig Start Date End Date Taking? Authorizing Provider  Cholecalciferol (VITAMIN D3) 2000 UNITS TABS Take 1 tablet by mouth daily.    [provider]  Empagliflozin-metFORMIN HCl (SYNJARDY) 07-998 MG TABS Take 1 tablet by mouth daily.    [provider]  folic acid (FOLVITE) 1 MG tablet Take 1 mg by mouth daily.    [provider]  furosemide (LASIX) 20 MG tablet Take 20 mg by mouth daily as needed.    [provider]  glucosamine-chondroitin 500-400 MG tablet Take 1 tablet by mouth 3 (three) times daily.    [provider]  levothyroxine (SYNTHROID, LEVOTHROID) 75 MCG tablet Take 75 mcg by mouth daily.      [provider]  losartan (COZAAR) 100 MG tablet Take 100 mg by mouth daily.      [provider]  MECLIZINE HCL PO Take 25 mg by mouth daily.     [provider]  Multiple Vitamin (MULTIVITAMIN) tablet Take 1 tablet by mouth daily.    [provider]  omeprazole (  PRILOSEC) 40 MG capsule Take 1 capsule (40 mg total) by mouth daily. 03/12/15   Pyrtle, Lajuan Lines, MD  sitaGLIPtan-metformin (JANUMET) 50-500 MG per tablet Take 1 tablet by mouth 2 (two) times daily with a meal.      [provider]  vitamin B-12 (CYANOCOBALAMIN) 100 MCG tablet Take 100 mcg by mouth daily.    [provider]    Family History Family History  Problem Relation Age of Onset  . Colon cancer Neg Hx     Social History Social History   Tobacco Use  . Smoking status: Never Smoker  . Smokeless tobacco: Never Used  Substance Use Topics  . Alcohol use: No  . Drug use: No     Allergies   Pyridium [phenazopyridine hcl], Sulfa antibiotics, Ciprofloxacin hcl, Esomeprazole magnesium, Macrodantin [nitrofurantoin macrocrystal], and Pantoprazole sodium   Review of Systems Review of Systems  Constitutional: Negative for appetite change, chills, diaphoresis and fever.   Gastrointestinal: Negative for abdominal pain, constipation, nausea and vomiting.       Had diarrhea last week, but this has resolved  Genitourinary: Positive for difficulty urinating, frequency and urgency. Negative for dysuria, flank pain and hematuria.  Musculoskeletal: Negative for myalgias.     Physical Exam Triage Vital Signs ED Triage Vitals [12/23/18 1311]  Enc Vitals Group     BP (!) 169/86     Pulse Rate 91     Resp 18     Temp 97.8 F (36.6 C)     Temp src      SpO2 100 %     Weight      Height      Head Circumference      Peak Flow      Pain Score 0     Pain Loc      Pain Edu?      Excl. in [place]?    No data found.  Updated Vital Signs BP (!) 169/86   Pulse 91   Temp 97.8 F (36.6 C)   Resp 18   SpO2 100%   Visual Acuity Right Eye Distance:   Left Eye Distance:   Bilateral Distance:    Right Eye Near:   Left Eye Near:    Bilateral Near:     Physical Exam Vitals signs and nursing note reviewed.  Constitutional:      Appearance: She is normal weight.     Comments: Here with her daughter who translated for me  HENT:     Head: Atraumatic.     Right Ear: External ear normal.     Left Ear: External ear normal.  Eyes:     General: No scleral icterus.    Conjunctiva/sclera: Conjunctivae normal.  Neck:     Musculoskeletal: Neck supple.  Pulmonary:     Effort: Pulmonary effort is normal.  Abdominal:     General: Bowel sounds are normal. There is no distension.     Palpations: Abdomen is soft. There is no mass.     Tenderness: There is no abdominal tenderness. There is no right CVA tenderness, left CVA tenderness, guarding or rebound.     Hernia: No hernia is present.     Comments: Has suprapubic pressure sensation during palpation, but I did not note her bladder being distended  Musculoskeletal: Normal range of motion.  Skin:    General: Skin is warm and dry.     Findings: No rash.  Neurological:     Mental Status: She is alert  and oriented to  person, place, and time.  Psychiatric:        Mood and Affect: Mood normal.        Behavior: Behavior normal.        Thought Content: Thought content normal.        Judgment: Judgment normal.      UC Treatments / Results  Labs (all labs ordered are listed, but only abnormal results are displayed) Labs Reviewed  POCT URINALYSIS DIP (DEVICE) - Abnormal; Notable for the following components:      Result Value   Hgb urine dipstick LARGE (*)    Protein, ur 100 (*)    Nitrite POSITIVE (*)    Leukocytes,Ua LARGE (*)    All other components within normal limits  URINE CULTURE    EKG   Radiology No results found.  Procedures Procedures (including critical care time)  Medications Ordered in UC Medications - No data to display  Initial Impression / Assessment and Plan / UC Course  I have reviewed the triage vital signs and the nursing notes.  Pertinent labs results that were available during my care of the patient were reviewed by me and considered in my medical decision making (see chart for details). I sent the urine out for a culture. Placed on Keflex 500 mg bid x 5 days.   Final Clinical Impressions(s) / UC Diagnoses   Final diagnoses:  Urinary tract infection with hematuria, site unspecified   Discharge Instructions   None    ED Prescriptions    None     PDMP not reviewed this encounter.   Shelby Mattocks, PA-C 12/23/18 1544

## 2018-12-23 NOTE — Telephone Encounter (Signed)
Pt called 7am regarding UTI sx. Urgency and low abd pain. Advised take fluids and cranberry juice,  Call back again, now she cannot urinated. Called Dr.Richens"s oncall service, nancy call back to me x2, and suggested to go to urgent care to have UA due to too many allergies. Arranged to ride and [employer] member Roger Shelter will pick her up to [facility]. Will f/u

## 2018-12-24 ENCOUNTER — Telehealth: Payer: Self-pay | Admitting: *Deleted

## 2018-12-24 ENCOUNTER — Encounter: Payer: Self-pay | Admitting: *Deleted

## 2018-12-24 LAB — URINE CULTURE
Culture: NO GROWTH
Special Requests: NORMAL

## 2018-12-24 NOTE — Telephone Encounter (Signed)
Pt felt much better now.  Reminded pt to finish all antibiotic.

## 2018-12-25 ENCOUNTER — Telehealth: Payer: Self-pay | Admitting: *Deleted

## 2018-12-25 NOTE — Telephone Encounter (Signed)
Called pt the result of urine culture, no bacteria growth, stop taking antibiotic. Pt said still could not empty bladder fully, but voiding. The pain low abd. Was gone. Told her will notify Dr. Maudie Mercury and get referral to urology.

## 2019-01-05 ENCOUNTER — Ambulatory Visit
Admission: RE | Admit: 2019-01-05 | Discharge: 2019-01-05 | Disposition: A | Discharge status: Home / Self Care | Payer: Medicare Other | Source: Ambulatory Visit | Attending: Rheumatology | Admitting: Rheumatology

## 2019-01-05 ENCOUNTER — Telehealth: Payer: Self-pay | Admitting: *Deleted

## 2019-01-05 DIAGNOSIS — M25462 Effusion, left knee: Secondary | ICD-10-CM

## 2019-01-05 DIAGNOSIS — S83242A Other tear of medial meniscus, current injury, left knee, initial encounter: Secondary | ICD-10-CM | POA: Diagnosis not present

## 2019-01-05 DIAGNOSIS — M25562 Pain in left knee: Secondary | ICD-10-CM | POA: Diagnosis not present

## 2019-01-05 DIAGNOSIS — R338 Other retention of urine: Secondary | ICD-10-CM | POA: Diagnosis not present

## 2019-01-05 DIAGNOSIS — R8279 Other abnormal findings on microbiological examination of urine: Secondary | ICD-10-CM | POA: Diagnosis not present

## 2019-01-05 NOTE — Telephone Encounter (Signed)
MRI and Dr's appointment reminded

## 2019-01-06 ENCOUNTER — Encounter: Payer: Self-pay | Admitting: *Deleted

## 2019-01-06 ENCOUNTER — Telehealth: Payer: Self-pay | Admitting: *Deleted

## 2019-01-06 NOTE — Congregational Nurse Program (Signed)
  Dept: [facility] Nurse Program Note  Date of Encounter: 01/06/2019  Past Medical History: Past Medical History:  Diagnosis Date  . Allergy   . Aortic atherosclerosis ([place])   . Diabetes mellitus   . Diverticulosis   . Dyspepsia   . Fecal incontinence   . Gastritis   . GERD (gastroesophageal reflux disease)   . 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 Hemorrhoids   . Hiatal hernia   . Hyperlipidemia   . Hyperlipidemia   . Hypertension   . Hypothyroidism   . IBS (irritable bowel syndrome)   . Iron overload 07/13/2016  . 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 Osteoporosis   . UTI (lower urinary tract infection)   . Vitamin D deficiency     Encounter Details: CNP Questionnaire - 01/05/19 1400      Questionnaire   Patient Status  Immigrant    Location Patient Served At  Not Applicable   [facility]   Insurance  Medicare    Uninsured  Not Applicable    Food  No food insecurities    Housing/Utilities  No permanent housing    Transportation  No transportation needs    Interpersonal Safety  Yes, feel physically and emotionally safe where you currently live    Medication  No medication insecurities    Medical Provider  Yes    Referrals  Not Applicable    ED Visit Averted  Not Applicable    [place]  Not Applicable      Urology Dr. Keane Scrape clinic visit. remains UTI symptom and dysuria.  UA shows blood  And bacteria per MD.  Also bladder scan showed 720ml residual. F-cath in, f/u 10 days later . On antibiotic Taught f-cath care to her daughter. Will f/u.

## 2019-01-06 NOTE — Telephone Encounter (Signed)
Check with pt. For f-cath. Said she did not exchaged night bag.   Explain why she needs change night bag . The urine bag have to be lower than bladder to prevent urine back to bladder. She said empty 3 times during the night. Also she is sitting in the bed that made risk backflow.  She will change to night bag when her daughter is back home.need more education. Sent f-cath care you tube to her daughter.

## 2019-01-07 ENCOUNTER — Encounter: Payer: Self-pay | Admitting: *Deleted

## 2019-01-07 NOTE — Congregational Nurse Program (Signed)
  Dept: [facility] Nurse Program Note  Date of Encounter: 01/07/2019  Past Medical History: Past Medical History:  Diagnosis Date  . Allergy   . Aortic atherosclerosis ([place])   . Diabetes mellitus   . Diverticulosis   . Dyspepsia   . Fecal incontinence   . Gastritis   . GERD (gastroesophageal reflux disease)   . 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 Hemorrhoids   . Hiatal hernia   . Hyperlipidemia   . Hyperlipidemia   . Hypertension   . Hypothyroidism   . IBS (irritable bowel syndrome)   . Iron overload 07/13/2016  . 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 Osteoporosis   . UTI (lower urinary tract infection)   . Vitamin D deficiency     Encounter Details: CNP Questionnaire - 01/07/19 0940      Questionnaire   Patient Status  Immigrant    Race  Asian    Location Patient Served At  Not Applicable   home visit   Insurance  Medicare    Uninsured  Not Applicable    Food  No food insecurities    Housing/Utilities  No permanent housing    Transportation  No transportation needs    Interpersonal Safety  Yes, feel physically and emotionally safe where you currently live    Medication  No medication insecurities    Medical Provider  Yes    Referrals  Not Applicable    ED Visit Averted  Not Applicable    Life-Saving Intervention Made  Not Applicable      check up f-cath  Due to pt refused to use night bag. Explain why need change to night back and need gravity drainge ,also collecting bag must be below bladder.  Daughter showed video to pt. that educated for urinary cath and care.  Pt understood. Spent 2 hrs this am to visit pt.

## 2019-01-15 DIAGNOSIS — R338 Other retention of urine: Secondary | ICD-10-CM | POA: Diagnosis not present

## 2019-01-16 ENCOUNTER — Telehealth: Payer: Self-pay | Admitting: *Deleted

## 2019-01-16 NOTE — Telephone Encounter (Signed)
01/15/2019 7Pm daughter said, when vs clinic, dc'd f-cath and insreted 35ml NS to bladder, and returned only 100cc. So Dr. Claudia Desanctis explained so well to explain to pt.  need 2weeks f/u with f-cath in, if not improving, need self catheterization. Might home health nurse vs 1x/week or 1x/month if needed. They changed new leg bag but did not gave night back. Advised to keep bag below the bladder to prevent infection, the daughter asked to dr.Pace if  The pt lying down with leg bag , the urine back flow to the bladder, and infected? The answer was no.per daughter. This nurse told the daughter, that  [place] could back flow to bladder and risk to infection. Advised to call office to get night urine back for safety. Will check again.

## 2019-01-16 NOTE — Telephone Encounter (Signed)
Appointment set up to 01/22/2019 3pm with Dr. Rex Kras And notified to pt's daughter, the clinic address and phone number given.

## 2019-01-22 DIAGNOSIS — M25562 Pain in left knee: Secondary | ICD-10-CM | POA: Diagnosis not present

## 2019-01-22 DIAGNOSIS — M84352A Stress fracture, left femur, initial encounter for fracture: Secondary | ICD-10-CM | POA: Diagnosis not present

## 2019-01-27 ENCOUNTER — Encounter: Payer: Self-pay | Admitting: *Deleted

## 2019-01-27 NOTE — Congregational Nurse Program (Signed)
  Dept: [facility] Nurse Program Note  Date of Encounter: 01/27/2019  Past Medical History: Past Medical History:  Diagnosis Date  . Allergy   . Aortic atherosclerosis ([place])   . Diabetes mellitus   . Diverticulosis   . Dyspepsia   . Fecal incontinence   . Gastritis   . GERD (gastroesophageal reflux disease)   . 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 Hemorrhoids   . Hiatal hernia   . Hyperlipidemia   . Hyperlipidemia   . Hypertension   . Hypothyroidism   . IBS (irritable bowel syndrome)   . Iron overload 07/13/2016  . 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 Osteoporosis   . UTI (lower urinary tract infection)   . Vitamin D deficiency     Encounter Details: CNP Questionnaire - 01/26/19 1000      Questionnaire   Patient Status  Immigrant    Race  Asian    Location Patient Served At  Not Applicable   home visit   Insurance  Medicare    Uninsured  Not Applicable    Food  No food insecurities    Housing/Utilities  No permanent housing    Transportation  No transportation needs    Interpersonal Safety  Yes, feel physically and emotionally safe where you currently live    Medication  No medication insecurities    Medical Provider  Yes    Referrals  Not Applicable    ED Visit Averted  Not Applicable    Life-Saving Intervention Made  Not Applicable      cont.f-cath for another week. Pt is doing well with leg bag. Pt vs orthopedic Dr. Lyla Glassing with daughter, brace ordered to prevent fall for Lt knee. N/v is improving .daughter stay 24 hrs with pt. To encouraging eat and care, emotional & spiritual care. Will f/u

## 2019-02-01 DIAGNOSIS — R338 Other retention of urine: Secondary | ICD-10-CM | POA: Diagnosis not present

## 2019-02-01 DIAGNOSIS — N3000 Acute cystitis without hematuria: Secondary | ICD-10-CM | POA: Diagnosis not present

## 2019-02-02 DIAGNOSIS — R300 Dysuria: Secondary | ICD-10-CM | POA: Diagnosis not present

## 2019-02-02 DIAGNOSIS — N3000 Acute cystitis without hematuria: Secondary | ICD-10-CM | POA: Diagnosis not present

## 2019-02-02 DIAGNOSIS — R350 Frequency of micturition: Secondary | ICD-10-CM | POA: Diagnosis not present

## 2019-02-12 ENCOUNTER — Telehealth: Payer: Self-pay | Admitting: *Deleted

## 2019-02-12 NOTE — Telephone Encounter (Signed)
Ortho equipment is ready to pick up for Lt Knee. Called to pt's daughter and gave phone number given. Pt. Is doing well . No problems of Urination. Good appetite, ambulate without cane per daughter.

## 2019-02-14 DIAGNOSIS — M25562 Pain in left knee: Secondary | ICD-10-CM | POA: Diagnosis not present

## 2019-02-14 DIAGNOSIS — M84352A Stress fracture, left femur, initial encounter for fracture: Secondary | ICD-10-CM | POA: Diagnosis not present

## 2019-02-15 ENCOUNTER — Encounter: Payer: Self-pay | Admitting: *Deleted

## 2019-02-15 NOTE — Congregational Nurse Program (Signed)
  Dept: [facility] Nurse Program Note  Date of Encounter: 02/15/2019  Past Medical History: Past Medical History:  Diagnosis Date  . Allergy   . Aortic atherosclerosis ([place])   . Diabetes mellitus   . Diverticulosis   . Dyspepsia   . Fecal incontinence   . Gastritis   . GERD (gastroesophageal reflux disease)   . 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 Hemorrhoids   . Hiatal hernia   . Hyperlipidemia   . Hyperlipidemia   . Hypertension   . Hypothyroidism   . IBS (irritable bowel syndrome)   . Iron overload 07/13/2016  . 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 Osteoporosis   . UTI (lower urinary tract infection)   . Vitamin D deficiency     Encounter Details: CNP Questionnaire - 02/15/19 1200      Questionnaire   Patient Status  Immigrant    Race  Asian    Location Patient Served At  Not Applicable   home visit   Insurance  Medicare    Uninsured  Not Applicable    Food  No food insecurities    Housing/Utilities  No permanent housing    Transportation  No transportation needs    Interpersonal Safety  Yes, feel physically and emotionally safe where you currently live    Medication  No medication insecurities    Medical Provider  Yes    Referrals  Not Applicable    ED Visit Averted  Not Applicable    Life-Saving Intervention Made  Not Applicable      visit to follow up after removed f-cath, knee brace. Pt is doing well without problems for now. Reminded Dr's appointment on January 12 with Dr. Benjamine Mola yearly check up.

## 2019-03-02 DIAGNOSIS — N3000 Acute cystitis without hematuria: Secondary | ICD-10-CM | POA: Diagnosis not present

## 2019-03-02 DIAGNOSIS — R338 Other retention of urine: Secondary | ICD-10-CM | POA: Diagnosis not present

## 2019-03-19 ENCOUNTER — Telehealth: Payer: Self-pay | Admitting: *Deleted

## 2019-03-19 NOTE — Telephone Encounter (Signed)
Pt. Stopped by church, but not contacted  The family of Covid pt in the church member.  Reminded pt stay home and keep PPE all the time. No acute stress voiced.

## 2019-03-22 ENCOUNTER — Encounter: Payer: Self-pay | Admitting: *Deleted

## 2019-03-22 NOTE — Congregational Nurse Program (Signed)
Pt daughter left, pt is self now. Check on her if any help,  need cholesterol med and DM med order. Otherwise she was in good condition.  meds ordered on line supposed to arrive 03/29/2019

## 2019-04-05 DIAGNOSIS — R338 Other retention of urine: Secondary | ICD-10-CM | POA: Diagnosis not present

## 2019-04-05 DIAGNOSIS — N130 Hydronephrosis with ureteropelvic junction obstruction: Secondary | ICD-10-CM | POA: Diagnosis not present

## 2019-04-06 ENCOUNTER — Encounter: Payer: Self-pay | Admitting: *Deleted

## 2019-04-06 ENCOUNTER — Telehealth: Payer: Self-pay | Admitting: *Deleted

## 2019-04-06 NOTE — Congregational Nurse Program (Signed)
  Dept: [facility] Nurse Program Note  Date of Encounter: 04/06/2019  Past Medical History: Past Medical History:  Diagnosis Date  . Allergy   . Aortic atherosclerosis ([place])   . Diabetes mellitus   . Diverticulosis   . Dyspepsia   . Fecal incontinence   . Gastritis   . GERD (gastroesophageal reflux disease)   . 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 Hemorrhoids   . Hiatal hernia   . Hyperlipidemia   . Hyperlipidemia   . Hypertension   . Hypothyroidism   . IBS (irritable bowel syndrome)   . Iron overload 07/13/2016  . 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 Osteoporosis   . UTI (lower urinary tract infection)   . Vitamin D deficiency     Encounter Details: CNP Questionnaire - 04/05/19 1600      Questionnaire   Patient Status  Immigrant    Race  Asian    Location Patient Served At  Not Applicable   Dr [facility]   Insurance  Medicare    Uninsured  Not Applicable    Food  No food insecurities    Housing/Utilities  No permanent housing    Transportation  Yes, need transportation assistance    Interpersonal Safety  Yes, feel physically and emotionally safe where you currently live    Medication  No medication insecurities    Medical Provider  Yes    Referrals  Not Applicable    ED Visit Averted  Not Applicable    Life-Saving Intervention Made  Not Applicable      Bladder residual 456ml after void,  Renal ultrasound done, Dr Claudia Desanctis mentioned need catheterization 2x/day. Demonstration given by staff.  Will f/u

## 2019-04-06 NOTE — Telephone Encounter (Signed)
Pt stated I&O cath this am, about 500 ml out. After cath, some blood on the wipes. Told need enough jells on the cath.  Asked pt. Record the amount of catheterization and amount of void and the time. Will f/p

## 2019-04-09 ENCOUNTER — Telehealth: Payer: Self-pay | Admitting: *Deleted

## 2019-04-09 NOTE — Telephone Encounter (Signed)
About 700 ml obtained in am and pm, even after void, almost same amount obtain. Little bit blood tinged end og cath. Reminded her use enough jelly, and bleeding was from irritation. Will stop . Will f/p

## 2019-04-10 ENCOUNTER — Encounter: Payer: Self-pay | Admitting: *Deleted

## 2019-04-10 NOTE — Congregational Nurse Program (Signed)
  Dept: [facility] Nurse Program Note  Date of Encounter: 04/10/2019  Past Medical History: Past Medical History:  Diagnosis Date  . Allergy   . Aortic atherosclerosis ([place])   . Diabetes mellitus   . Diverticulosis   . Dyspepsia   . Fecal incontinence   . Gastritis   . GERD (gastroesophageal reflux disease)   . 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 Hemorrhoids   . Hiatal hernia   . Hyperlipidemia   . Hyperlipidemia   . Hypertension   . Hypothyroidism   . IBS (irritable bowel syndrome)   . Iron overload 07/13/2016  . 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 Osteoporosis   . UTI (lower urinary tract infection)   . Vitamin D deficiency     Encounter Details: CNP Questionnaire - 04/10/19 0900      Questionnaire   Patient Status  Immigrant    Race  Asian    Location Patient Served At  Not Applicable   home visit   Insurance  Medicare    Uninsured  Not Applicable    Food  No food insecurities    Housing/Utilities  No permanent housing    Transportation  No transportation needs    Interpersonal Safety  Yes, feel physically and emotionally safe where you currently live    Medication  No medication insecurities    Medical Provider  Yes    Referrals  Not Applicable    ED Visit Averted  Not Applicable    Life-Saving Intervention Made  Not Applicable      home visit for how does catheterization by pt. Said 500-700 ml each time at morning and evning. Between then void 200-300 ml.. expecting more cath coming by mail.. Will f/u

## 2019-04-14 ENCOUNTER — Encounter: Payer: Self-pay | Admitting: *Deleted

## 2019-04-14 ENCOUNTER — Telehealth: Payer: Self-pay | Admitting: *Deleted

## 2019-04-14 NOTE — Telephone Encounter (Signed)
Pt stated run out catheter, called and talked to Dr Claudia Desanctis yesterday after5pm told me go to [facility] supply.  thia morning around 11 am, beento the medical suppli, but said needed RX by fax. Notified on call Dr. Lovena Neighbours , but could not give orders by fax, said He would notify to Dr Claudia Desanctis on Monday AM. Called pt. Several times due to unable to contact. notified pt. visited her by other church member lives near by. Will f/u  Pt stated each time doing I&O cath, over 700 ml obtained.

## 2019-04-15 ENCOUNTER — Telehealth: Payer: Self-pay | Admitting: *Deleted

## 2019-04-15 NOTE — Telephone Encounter (Signed)
Reminded pt to get lab in the morning. NPO after 10 PM tonight.

## 2019-04-16 ENCOUNTER — Encounter: Payer: Self-pay | Admitting: *Deleted

## 2019-04-16 DIAGNOSIS — I10 Essential (primary) hypertension: Secondary | ICD-10-CM | POA: Diagnosis not present

## 2019-04-16 DIAGNOSIS — E1165 Type 2 diabetes mellitus with hyperglycemia: Secondary | ICD-10-CM | POA: Diagnosis not present

## 2019-04-16 DIAGNOSIS — E039 Hypothyroidism, unspecified: Secondary | ICD-10-CM | POA: Diagnosis not present

## 2019-04-16 NOTE — Congregational Nurse Program (Signed)
  Dept: [facility] Nurse Program Note  Date of Encounter: 04/16/2019  Past Medical History: Past Medical History:  Diagnosis Date  . Allergy   . Aortic atherosclerosis ([place])   . Diabetes mellitus   . Diverticulosis   . Dyspepsia   . Fecal incontinence   . Gastritis   . GERD (gastroesophageal reflux disease)   . 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 Hemorrhoids   . Hiatal hernia   . Hyperlipidemia   . Hyperlipidemia   . Hypertension   . Hypothyroidism   . IBS (irritable bowel syndrome)   . Iron overload 07/13/2016  . 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 Osteoporosis   . UTI (lower urinary tract infection)   . Vitamin D deficiency     Encounter Details: CNP Questionnaire - 04/16/19 1100      Questionnaire   Patient Status  Immigrant    Race  Asian    Location Patient Served At  Not Applicable   Dr's office   Insurance  Medicare    Uninsured  Not Applicable    Food  No food insecurities    Housing/Utilities  No permanent housing    Transportation  Yes, need transportation assistance    Interpersonal Safety  Yes, feel physically and emotionally safe where you currently live    Medication  No medication insecurities    Medical Provider  Yes    Referrals  Not Applicable    ED Visit Averted  Not Applicable    Life-Saving Intervention Made  Not Applicable     Labs for Dr. Julianne Rice office for pre appointment visit, then stopped by Dr. Keane Scrape clinic to get I&O cath and confirmed ordered I&O cath by staff of Dr. Keane Scrape clinc. Made appointment @ 05/10/2019 3;15pm Will f/u for catheterization due to short cath given today.

## 2019-04-17 ENCOUNTER — Encounter: Payer: Self-pay | Admitting: *Deleted

## 2019-04-17 NOTE — Congregational Nurse Program (Unsigned)
  Dept: [facility] Nurse Program Note  Date of Encounter: 04/17/2019  Past Medical History: Past Medical History:  Diagnosis Date  . Allergy   . Aortic atherosclerosis ([place])   . Diabetes mellitus   . Diverticulosis   . Dyspepsia   . Fecal incontinence   . Gastritis   . GERD (gastroesophageal reflux disease)   . 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 Hemorrhoids   . Hiatal hernia   . Hyperlipidemia   . Hyperlipidemia   . Hypertension   . Hypothyroidism   . IBS (irritable bowel syndrome)   . Iron overload 07/13/2016  . 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 Osteoporosis   . UTI (lower urinary tract infection)   . Vitamin D deficiency     Encounter Details: CNP Questionnaire - 04/17/19 1700      Questionnaire   Patient Status  Immigrant    Race  Asian    Location Patient Served At  Not Applicable   [place]  Medicare    Uninsured  Not Applicable    Food  No food insecurities    Housing/Utilities  No permanent housing    Transportation  No transportation needs    Interpersonal Safety  Yes, feel physically and emotionally safe where you currently live    Medication  No medication insecurities    Medical Provider  Yes    Referrals  Not Applicable    ED Visit Averted  Not Applicable    Life-Saving Intervention Made  Not Applicable      fax medical insurance information to medical supply for the I&O cath orders by Dr. Claudia Desanctis. Met pt at the church, no difficulties I&O cath by herself. Received 14 cath from clinic yesterday,  Medical supply person said it will takes a week for approve, and contact with me.

## 2019-04-19 DIAGNOSIS — R339 Retention of urine, unspecified: Secondary | ICD-10-CM | POA: Diagnosis not present

## 2019-04-23 ENCOUNTER — Telehealth: Payer: Self-pay | Admitting: *Deleted

## 2019-04-23 NOTE — Telephone Encounter (Signed)
Dr's appointment with Dr. Maudie Mercury tomorrow.will meet at the clinic for interprete.

## 2019-04-24 DIAGNOSIS — E785 Hyperlipidemia, unspecified: Secondary | ICD-10-CM | POA: Diagnosis not present

## 2019-04-24 DIAGNOSIS — R339 Retention of urine, unspecified: Secondary | ICD-10-CM | POA: Diagnosis not present

## 2019-04-24 DIAGNOSIS — E1165 Type 2 diabetes mellitus with hyperglycemia: Secondary | ICD-10-CM | POA: Diagnosis not present

## 2019-04-24 DIAGNOSIS — Z0000 Encounter for general adult medical examination without abnormal findings: Secondary | ICD-10-CM | POA: Diagnosis not present

## 2019-04-24 DIAGNOSIS — I10 Essential (primary) hypertension: Secondary | ICD-10-CM | POA: Diagnosis not present

## 2019-05-08 ENCOUNTER — Encounter: Payer: Self-pay | Admitting: *Deleted

## 2019-05-08 NOTE — Congregational Nurse Program (Signed)
  Dept: [facility] Nurse Program Note  Date of Encounter: 05/08/2019  Past Medical History: Past Medical History:  Diagnosis Date  . Allergy   . Aortic atherosclerosis ([place])   . Diabetes mellitus   . Diverticulosis   . Dyspepsia   . Fecal incontinence   . Gastritis   . GERD (gastroesophageal reflux disease)   . 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 Hemorrhoids   . Hiatal hernia   . Hyperlipidemia   . Hyperlipidemia   . Hypertension   . Hypothyroidism   . IBS (irritable bowel syndrome)   . Iron overload 07/13/2016  . 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 Osteoporosis   . UTI (lower urinary tract infection)   . Vitamin D deficiency     Encounter Details: CNP Questionnaire - 05/08/19 1000      Questionnaire   Patient Status  Immigrant    Race  Asian    Location Patient Served At  Not Applicable   home visiting   Insurance  Medicare    Uninsured  Not Applicable    Food  No food insecurities    Housing/Utilities  No permanent housing    Transportation  No transportation needs    Interpersonal Safety  Yes, feel physically and emotionally safe where you currently live    Medication  No medication insecurities    Medical Provider  Yes    Referrals  Other   left message to the Dr. Maudie Mercury for Leg pain need evaluation herniated disk?   ED Visit Averted  Not Applicable    Life-Saving Intervention Made  Not Applicable     1.c/o leg pain, radiated leg to foot Lt. Unable to sleep. Took tylenol and advil alternated, not effective. Unable to sleep for pain. 2. Urinary retention , she said void 223ml then I&O cath about 622ml this am. Ongoing problems- appointment with Dr.Pace 2/25 3pm. 3. covid vaccine scheduled today 1st dose.- explain the allergic sx and need call 911 if any serious condition. Later she called had shot without problems.  Show the back pain stretching exercise, notified Dr.Mahone to evaluate  her leg pain.

## 2019-05-10 DIAGNOSIS — R8271 Bacteriuria: Secondary | ICD-10-CM | POA: Diagnosis not present

## 2019-05-10 DIAGNOSIS — N130 Hydronephrosis with ureteropelvic junction obstruction: Secondary | ICD-10-CM | POA: Diagnosis not present

## 2019-05-10 DIAGNOSIS — R338 Other retention of urine: Secondary | ICD-10-CM | POA: Diagnosis not present

## 2019-05-11 ENCOUNTER — Encounter: Payer: Self-pay | Admitting: *Deleted

## 2019-05-11 NOTE — Congregational Nurse Program (Unsigned)
  Dept: [facility] Nurse Program Note  Date of Encounter: 05/11/2019  Past Medical History: Past Medical History:  Diagnosis Date  . Allergy   . Aortic atherosclerosis ([place])   . Diabetes mellitus   . Diverticulosis   . Dyspepsia   . Fecal incontinence   . Gastritis   . GERD (gastroesophageal reflux disease)   . 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 Hemorrhoids   . Hiatal hernia   . Hyperlipidemia   . Hyperlipidemia   . Hypertension   . Hypothyroidism   . IBS (irritable bowel syndrome)   . Iron overload 07/13/2016  . 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 Osteoporosis   . UTI (lower urinary tract infection)   . Vitamin D deficiency     Encounter Details: CNP Questionnaire - 05/10/19 1630      Questionnaire   Patient Status  Immigrant    Race  Asian    Location Patient [facility] office   Insurance  Medicare    Uninsured  Not Applicable    Food  No food insecurities    Housing/Utilities  No permanent housing   gave ride   Transportation  --   gave ride   Interpersonal Safety  Yes, feel physically and emotionally safe where you currently live    Medication  No medication insecurities    Medical Provider  Yes    Referrals  Not Applicable    ED Visit Averted  Not Applicable      Dr Claudia Desanctis office visited evaluation s/p I&O cath. Remains residual urine is 600~888ml, Dr pace changed to I&O cath 4times per day for decrease retention amount less than 564ml. interprete to pt & Dr.Pace.  Made appointment for leg pain too.

## 2019-05-15 ENCOUNTER — Encounter: Payer: Self-pay | Admitting: *Deleted

## 2019-05-15 DIAGNOSIS — M79672 Pain in left foot: Secondary | ICD-10-CM | POA: Diagnosis not present

## 2019-05-15 DIAGNOSIS — M79662 Pain in left lower leg: Secondary | ICD-10-CM | POA: Diagnosis not present

## 2019-05-15 DIAGNOSIS — R52 Pain, unspecified: Secondary | ICD-10-CM | POA: Diagnosis not present

## 2019-05-15 NOTE — Congregational Nurse Program (Unsigned)
  Dept: [facility] Nurse Program Note  Date of Encounter: 05/15/2019  Past Medical History: Past Medical History:  Diagnosis Date  . Allergy   . Aortic atherosclerosis ([place])   . Diabetes mellitus   . Diverticulosis   . Dyspepsia   . Fecal incontinence   . Gastritis   . GERD (gastroesophageal reflux disease)   . 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 Hemorrhoids   . Hiatal hernia   . Hyperlipidemia   . Hyperlipidemia   . Hypertension   . Hypothyroidism   . IBS (irritable bowel syndrome)   . Iron overload 07/13/2016  . 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 Osteoporosis   . UTI (lower urinary tract infection)   . Vitamin D deficiency    Visited to evaluate Lt Leg pain. Lon ordered US to r/o DVT, referral to neuro surgeon. They will contact to me by wed. For [place] . Pt. C/o Lt leg and foot has been hurting, could not sleep or woke up by pain. Tried tylenol and Advil, did not help.  Also requested for cane RX due to her cane is not adjusted by length.  Stopped by [employer] (medical supply), bought cane and [facility] to buy Volteran ointment.  Also her son will provide 4% lidocane spray . Will f/u  Encounter Details:

## 2019-05-17 ENCOUNTER — Other Ambulatory Visit: Payer: Self-pay | Admitting: Internal Medicine

## 2019-05-17 DIAGNOSIS — R339 Retention of urine, unspecified: Secondary | ICD-10-CM | POA: Diagnosis not present

## 2019-05-17 DIAGNOSIS — M79662 Pain in left lower leg: Secondary | ICD-10-CM

## 2019-05-18 ENCOUNTER — Ambulatory Visit
Admission: RE | Admit: 2019-05-18 | Discharge: 2019-05-18 | Disposition: A | Discharge status: Home / Self Care | Payer: Medicare Other | Source: Ambulatory Visit | Attending: Internal Medicine | Admitting: Internal Medicine

## 2019-05-18 ENCOUNTER — Telehealth: Payer: Self-pay | Admitting: *Deleted

## 2019-05-18 DIAGNOSIS — M79662 Pain in left lower leg: Secondary | ICD-10-CM | POA: Diagnosis not present

## 2019-05-18 DIAGNOSIS — R339 Retention of urine, unspecified: Secondary | ICD-10-CM | POA: Diagnosis not present

## 2019-05-18 NOTE — Telephone Encounter (Signed)
Dr. Julianne Rice office called yesterday that [place] scheduled for the ultra sonogram, arranged for church members to volunteer to go with her  For ride and interpreter. Updated she went to and done today.

## 2019-05-21 ENCOUNTER — Telehealth: Payer: Self-pay | Admitting: *Deleted

## 2019-05-21 NOTE — Telephone Encounter (Signed)
Pt's apartment called me for pt's 2nd Covid vaccine scheduled @ 06/05/2019 @ 2:15 pm. Let pt. Know. Her 2nd ship of I&o cath arrived today 60ea for additional supplies. Let her know her neurology MD appointment is on 06/13/19 2;30 pm. Left Dr.Knoll a note for order CT or MRI order before go to see neuro surgeon that Dr's office staffing suggested.

## 2019-05-28 ENCOUNTER — Encounter: Payer: Self-pay | Admitting: *Deleted

## 2019-05-28 NOTE — Congregational Nurse Program (Signed)
  Dept: [facility] Nurse Program Note  Date of Encounter: 05/28/2019  Past Medical History: Past Medical History:  Diagnosis Date  . Allergy   . Aortic atherosclerosis ([place])   . Diabetes mellitus   . Diverticulosis   . Dyspepsia   . Fecal incontinence   . Gastritis   . GERD (gastroesophageal reflux disease)   . 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 Hemorrhoids   . Hiatal hernia   . Hyperlipidemia   . Hyperlipidemia   . Hypertension   . Hypothyroidism   . IBS (irritable bowel syndrome)   . Iron overload 07/13/2016  . 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 Osteoporosis   . UTI (lower urinary tract infection)   . Vitamin D deficiency     Encounter Details: Home visit due to leg pain, medicine orders,mail check  remains leg pain. Awaiting consulting neurology  Visit on 06/13/2019 Checked mails and pay bills.  Janumet 5/500 BID run out soon. Ordered [employer] Rx, but needs refill order. Sent message to Dr. Julianne Rice office  And talked to [employer]  person too. Will follow up.

## 2019-06-12 DIAGNOSIS — R339 Retention of urine, unspecified: Secondary | ICD-10-CM | POA: Diagnosis not present

## 2019-06-13 DIAGNOSIS — M5416 Radiculopathy, lumbar region: Secondary | ICD-10-CM | POA: Diagnosis not present

## 2019-06-14 ENCOUNTER — Encounter: Payer: Self-pay | Admitting: *Deleted

## 2019-06-14 NOTE — Congregational Nurse Program (Unsigned)
  Dept: [facility] Nurse Program Note  Date of Encounter: 06/13/2019 Past Medical History: Past Medical History:  Diagnosis Date  . Allergy   . Aortic atherosclerosis ([place])   . Diabetes mellitus   . Diverticulosis   . Dyspepsia   . Fecal incontinence   . Gastritis   . GERD (gastroesophageal reflux disease)   . 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 Hemorrhoids   . Hiatal hernia   . Hyperlipidemia   . Hyperlipidemia   . Hypertension   . Hypothyroidism   . IBS (irritable bowel syndrome)   . Iron overload 07/13/2016  . 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 Osteoporosis   . UTI (lower urinary tract infection)   . Vitamin D deficiency     Encounter Details: CNP Questionnaire - 06/13/19 1800      Questionnaire   Patient Status  Immigrant    Race  Asian    Location Patient Served At  Not Applicable   Dr[facility]   Insurance  Medicare    Uninsured  Not Applicable    Food  No food insecurities    Housing/Utilities  No permanent housing    Transportation  Yes, need transportation assistance    Interpersonal Safety  Yes, feel physically and emotionally safe where you currently live    Medication  No medication insecurities    Medical Provider  Yes    Referrals  Not Applicable    ED Visit Averted  Yes    Life-Saving Intervention Made  Yes      c/o Lt leg pain, visited Dr. [facility]  [facility]inic, check flex x-ray and need MRI. Spine length increasing exercise brochure with explain, Prednisone packet start for pain. Check with insurance for MRI and call me back for MRI and f/u with Dr. Ellene Route. Spent 6 hrs total. Today.

## 2019-06-17 ENCOUNTER — Telehealth: Payer: Self-pay | Admitting: *Deleted

## 2019-06-17 NOTE — Telephone Encounter (Signed)
taing Prednison Pack now, pain is better for now. Worried regarding BP. 135~170's SBP per pt. Will notify Dr. Maudie Mercury

## 2019-06-21 DIAGNOSIS — M48061 Spinal stenosis, lumbar region without neurogenic claudication: Secondary | ICD-10-CM | POA: Diagnosis not present

## 2019-06-21 DIAGNOSIS — M5416 Radiculopathy, lumbar region: Secondary | ICD-10-CM | POA: Diagnosis not present

## 2019-06-21 DIAGNOSIS — M5127 Other intervertebral disc displacement, lumbosacral region: Secondary | ICD-10-CM | POA: Diagnosis not present

## 2019-07-04 DIAGNOSIS — M48062 Spinal stenosis, lumbar region with neurogenic claudication: Secondary | ICD-10-CM | POA: Diagnosis not present

## 2019-07-05 ENCOUNTER — Encounter: Payer: Self-pay | Admitting: *Deleted

## 2019-07-05 NOTE — Congregational Nurse Program (Signed)
  Dept: [facility] Nurse Program Note  Date of Encounter: 07/04/2019 Past Medical History: Past Medical History:  Diagnosis Date  . Allergy   . Aortic atherosclerosis ([place])   . Diabetes mellitus   . Diverticulosis   . Dyspepsia   . Fecal incontinence   . Gastritis   . GERD (gastroesophageal reflux disease)   . 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 Hemorrhoids   . Hiatal hernia   . Hyperlipidemia   . Hyperlipidemia   . Hypertension   . Hypothyroidism   . IBS (irritable bowel syndrome)   . Iron overload 07/13/2016  . 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 Osteoporosis   . UTI (lower urinary tract infection)   . Vitamin D deficiency     Encounter Details: CNP Questionnaire - 07/04/19 1430      Questionnaire   Patient Status  Immigrant    Race  Asian    Location Patient Served At  Not Applicable   Dr. Clarice Pole office   Insurance  Medicare    Uninsured  Not Applicable    Food  No food insecurities    Housing/Utilities  No permanent housing    Transportation  Yes, need transportation assistance    Interpersonal Safety  Yes, feel physically and emotionally safe where you currently live    Medication  No medication insecurities    Medical Provider  Yes    Referrals  Not Applicable    ED Visit Averted  Not Applicable    Life-Saving Intervention Made  Not Applicable      Visited Dr. 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 Need decompression back surg. But Pt.wanted other option. As long as working with prednision ( c/o hear pounding and fast while on the steroid pack), might try Depo. Injection under X-ray per MD. Pt. choose shot. Scheduled 07/10/2019 1300. No food 6 hrs before, no water 2 hrs before. Stop taking janumet  Today , [facility] be [facility]. Arranged to person to go with pt. Needs ride and stay with pt. Spent 4hrs.

## 2019-07-09 DIAGNOSIS — R338 Other retention of urine: Secondary | ICD-10-CM | POA: Diagnosis not present

## 2019-07-09 DIAGNOSIS — N130 Hydronephrosis with ureteropelvic junction obstruction: Secondary | ICD-10-CM | POA: Diagnosis not present

## 2019-07-10 ENCOUNTER — Encounter: Payer: Self-pay | Admitting: *Deleted

## 2019-07-10 DIAGNOSIS — M5416 Radiculopathy, lumbar region: Secondary | ICD-10-CM | POA: Diagnosis not present

## 2019-07-10 DIAGNOSIS — M5116 Intervertebral disc disorders with radiculopathy, lumbar region: Secondary | ICD-10-CM | POA: Diagnosis not present

## 2019-07-10 NOTE — Congregational Nurse Program (Signed)
  Dept: [facility] Nurse Program Note  Date of Encounter: 07/10/2019  Past Medical History: Past Medical History:  Diagnosis Date  . Allergy   . Aortic atherosclerosis ([place])   . Diabetes mellitus   . Diverticulosis   . Dyspepsia   . Fecal incontinence   . Gastritis   . GERD (gastroesophageal reflux disease)   . 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 Hemorrhoids   . Hiatal hernia   . Hyperlipidemia   . Hyperlipidemia   . Hypertension   . Hypothyroidism   . IBS (irritable bowel syndrome)   . Iron overload 07/13/2016  . 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 Osteoporosis   . UTI (lower urinary tract infection)   . Vitamin D deficiency     Encounter Details: CNP Questionnaire - 07/09/19 1100      Questionnaire   Patient Status  Immigrant    Race  Asian    Location Patient Served At  Not Applicable   [facility]   Insurance  Medicare    Uninsured  Not Applicable    Food  No food insecurities    Housing/Utilities  No permanent housing    Transportation  Yes, need transportation assistance    Interpersonal Safety  Yes, feel physically and emotionally safe where you currently live    Medication  No medication insecurities    Medical Provider  Yes    Referrals  Not Applicable    ED Visit Averted  Not Applicable    Life-Saving Intervention Made  Not Applicable       Keep I&O cath 4x/day. Pt refused take new med for control nocturia, or botox tx.  Will continue I&O cath, if there's sx of UTI that burnig, hematuria, fever, call MD. Will f/u 1year visit for now.

## 2019-07-11 DIAGNOSIS — R339 Retention of urine, unspecified: Secondary | ICD-10-CM | POA: Diagnosis not present

## 2019-08-07 DIAGNOSIS — R339 Retention of urine, unspecified: Secondary | ICD-10-CM | POA: Diagnosis not present

## 2019-08-11 ENCOUNTER — Encounter: Payer: Self-pay | Admitting: *Deleted

## 2019-08-11 NOTE — Congregational Nurse Program (Signed)
  Dept: [facility] Nurse Program Note  Date of Encounter: 08/07/2019  Past Medical History: Past Medical History:  Diagnosis Date  . Allergy   . Aortic atherosclerosis ([place])   . Diabetes mellitus   . Diverticulosis   . Dyspepsia   . Fecal incontinence   . Gastritis   . GERD (gastroesophageal reflux disease)   . 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 Hemorrhoids   . Hiatal hernia   . Hyperlipidemia   . Hyperlipidemia   . Hypertension   . Hypothyroidism   . IBS (irritable bowel syndrome)   . Iron overload 07/13/2016  . 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 Osteoporosis   . UTI (lower urinary tract infection)   . Vitamin D deficiency     Encounter Details: CNP Questionnaire - 08/07/19 1100      Questionnaire   Patient Status  Immigrant    Race  Asian    Location Patient Served At  Not Applicable   home visit   Insurance  Medicare    Uninsured  Not Applicable    Food  No food insecurities    Housing/Utilities  No permanent housing    Transportation  No transportation needs    Interpersonal Safety  Yes, feel physically and emotionally safe where you currently live    Medication  No medication insecurities    Medical Provider  Yes    Referrals  Not Applicable    ED Visit Averted  Not Applicable    Life-Saving Intervention Made  Not Applicable      home visit for follow up after steroid shot in the back. Able to walk 3 times per day about 30 min. With cane. Nerve pain is gone, but muscle aching is on and off per pt. Other wise doing well.

## 2019-08-11 NOTE — Congregational Nurse Program (Signed)
  Dept: [facility] Nurse Program Note  Date of Encounter: 08/07/2019  Past Medical History: Past Medical History:  Diagnosis Date  . Allergy   . Aortic atherosclerosis ([place])   . Diabetes mellitus   . Diverticulosis   . Dyspepsia   . Fecal incontinence   . Gastritis   . GERD (gastroesophageal reflux disease)   . 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 Hemorrhoids   . Hiatal hernia   . Hyperlipidemia   . Hyperlipidemia   . Hypertension   . Hypothyroidism   . IBS (irritable bowel syndrome)   . Iron overload 07/13/2016  . 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 Osteoporosis   . UTI (lower urinary tract infection)   . Vitamin D deficiency     Encounter Details:  f/p after steroid shot to back by Dr Ellene Route.  Doing well, ambulate with cane , and walking 72min. 3 times per day.

## 2019-09-06 DIAGNOSIS — R339 Retention of urine, unspecified: Secondary | ICD-10-CM | POA: Diagnosis not present

## 2019-09-09 ENCOUNTER — Encounter: Payer: Self-pay | Admitting: *Deleted

## 2019-09-09 NOTE — Congregational Nurse Program (Signed)
  Dept: [facility] Nurse Program Note  Date of Encounter: 09/09/2019  Past Medical History: Past Medical History:  Diagnosis Date  . Allergy   . Aortic atherosclerosis ([place])   . Diabetes mellitus   . Diverticulosis   . Dyspepsia   . Fecal incontinence   . Gastritis   . GERD (gastroesophageal reflux disease)   . 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 Hemorrhoids   . Hiatal hernia   . Hyperlipidemia   . Hyperlipidemia   . Hypertension   . Hypothyroidism   . IBS (irritable bowel syndrome)   . Iron overload 07/13/2016  . 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 Osteoporosis   . UTI (lower urinary tract infection)   . Vitamin D deficiency     Encounter Details:  CNP Questionnaire - 09/09/19 1300      Questionnaire   Patient Status Immigrant    Race Asian    Location Patient Served At Not Applicable   [place] Medicare    Uninsured Not Applicable    Food No food insecurities    Housing/Utilities No permanent housing    Transportation No transportation needs    Interpersonal Safety Yes, feel physically and emotionally safe where you currently live    Medication No medication insecurities    Medical Provider Yes    Referrals Not Applicable    ED Visit Averted Not Applicable    Life-Saving Intervention Made Not Applicable         checked medication and urinary cath. Received that ordered for the pt. Also ordered glucose strips via online. No c/o voiced now. Energy levels normal.per pt.

## 2019-09-29 ENCOUNTER — Telehealth: Payer: Self-pay | Admitting: *Deleted

## 2019-09-29 NOTE — Telephone Encounter (Signed)
Re ordered urinary cath that will arrive estimate 10-09-19, and meds for BP and thyroid that will deliver 4-5 days to home. Pt needs help for the mails, will bring to church tomorrow.no other c/o voiced.

## 2019-10-04 ENCOUNTER — Telehealth: Payer: Self-pay | Admitting: *Deleted

## 2019-10-04 NOTE — Telephone Encounter (Signed)
Pt  Received Losartan and thyroid medicine, cholesterol medicine and urinary cath is on the way. No other c/o voiced.

## 2019-10-08 DIAGNOSIS — R339 Retention of urine, unspecified: Secondary | ICD-10-CM | POA: Diagnosis not present

## 2019-10-22 DIAGNOSIS — E1165 Type 2 diabetes mellitus with hyperglycemia: Secondary | ICD-10-CM | POA: Diagnosis not present

## 2019-10-22 DIAGNOSIS — E039 Hypothyroidism, unspecified: Secondary | ICD-10-CM | POA: Diagnosis not present

## 2019-10-22 DIAGNOSIS — I10 Essential (primary) hypertension: Secondary | ICD-10-CM | POA: Diagnosis not present

## 2019-11-01 DIAGNOSIS — I10 Essential (primary) hypertension: Secondary | ICD-10-CM | POA: Diagnosis not present

## 2019-11-01 DIAGNOSIS — E785 Hyperlipidemia, unspecified: Secondary | ICD-10-CM | POA: Diagnosis not present

## 2019-11-01 DIAGNOSIS — M25562 Pain in left knee: Secondary | ICD-10-CM | POA: Diagnosis not present

## 2019-11-01 DIAGNOSIS — D649 Anemia, unspecified: Secondary | ICD-10-CM | POA: Diagnosis not present

## 2019-11-01 DIAGNOSIS — K219 Gastro-esophageal reflux disease without esophagitis: Secondary | ICD-10-CM | POA: Diagnosis not present

## 2019-11-01 DIAGNOSIS — E039 Hypothyroidism, unspecified: Secondary | ICD-10-CM | POA: Diagnosis not present

## 2019-11-07 DIAGNOSIS — R339 Retention of urine, unspecified: Secondary | ICD-10-CM | POA: Diagnosis not present

## 2019-11-18 ENCOUNTER — Encounter: Payer: Self-pay | Admitting: *Deleted

## 2019-12-07 DIAGNOSIS — R339 Retention of urine, unspecified: Secondary | ICD-10-CM | POA: Diagnosis not present

## 2019-12-10 ENCOUNTER — Telehealth: Payer: Self-pay | Admitting: *Deleted

## 2019-12-10 NOTE — Telephone Encounter (Signed)
Ordered I&O cath supplies 2weeks ago, pt said received yesterday.

## 2019-12-22 ENCOUNTER — Telehealth: Payer: Self-pay | Admitting: *Deleted

## 2019-12-22 NOTE — Telephone Encounter (Signed)
Orthopedic Dr"s office sent me note regarding Pt"s appointment on 12/27/19 with Dr. Darryl Lent, let her daughter reminded the appointment.

## 2019-12-27 DIAGNOSIS — M1712 Unilateral primary osteoarthritis, left knee: Secondary | ICD-10-CM | POA: Diagnosis not present

## 2020-01-01 ENCOUNTER — Telehealth: Payer: Self-pay | Admitting: *Deleted

## 2020-01-01 NOTE — Telephone Encounter (Signed)
Dr. Darryl Lent office called for Lt Knee surg on 02/25/20 @ 0830, will put on waiting list if someone canceled surgery. Need pre-op visit at Dr. Darryl Lent office on 11/19/@0830  for education about 20-30 min.  [facility] will call 2weeks before of surgery. Notified pt's daughter.

## 2020-01-07 DIAGNOSIS — R339 Retention of urine, unspecified: Secondary | ICD-10-CM | POA: Diagnosis not present

## 2020-02-06 DIAGNOSIS — R339 Retention of urine, unspecified: Secondary | ICD-10-CM | POA: Diagnosis not present

## 2020-02-12 ENCOUNTER — Encounter (HOSPITAL_COMMUNITY): Payer: Medicare Other

## 2020-02-14 ENCOUNTER — Other Ambulatory Visit (HOSPITAL_COMMUNITY): Payer: Medicare Other

## 2020-02-15 ENCOUNTER — Encounter (HOSPITAL_COMMUNITY): Payer: Self-pay

## 2020-02-15 ENCOUNTER — Encounter: Payer: Self-pay | Admitting: *Deleted

## 2020-02-15 ENCOUNTER — Encounter (HOSPITAL_COMMUNITY)
Admission: RE | Admit: 2020-02-15 | Discharge: 2020-02-15 | Disposition: A | Discharge status: Home / Self Care | Payer: Medicare Other | Source: Ambulatory Visit | Attending: Orthopedic Surgery | Admitting: Orthopedic Surgery

## 2020-02-15 ENCOUNTER — Other Ambulatory Visit: Payer: Self-pay

## 2020-02-15 DIAGNOSIS — E119 Type 2 diabetes mellitus without complications: Secondary | ICD-10-CM | POA: Insufficient documentation

## 2020-02-15 DIAGNOSIS — Z01818 Encounter for other preprocedural examination: Secondary | ICD-10-CM | POA: Diagnosis not present

## 2020-02-15 HISTORY — DX: Unspecified osteoarthritis, unspecified site: M19.90

## 2020-02-15 LAB — COMPREHENSIVE METABOLIC PANEL
ALT: 15 U/L (ref 0–44)
AST: 19 U/L (ref 15–41)
Albumin: 4 g/dL (ref 3.5–5.0)
Alkaline Phosphatase: 43 U/L (ref 38–126)
Anion gap: 10 (ref 5–15)
BUN: 22 mg/dL (ref 8–23)
CO2: 25 mmol/L (ref 22–32)
Calcium: 9 mg/dL (ref 8.9–10.3)
Chloride: 102 mmol/L (ref 98–111)
Creatinine, Ser: 0.54 mg/dL (ref 0.44–1.00)
GFR, Estimated: >60 mL/min (ref >60)
Glucose, Bld: 116 mg/dL — ABNORMAL HIGH (ref 70–99)
Potassium: 4 mmol/L (ref 3.5–5.1)
Sodium: 137 mmol/L (ref 135–145)
Total Bilirubin: 0.9 mg/dL (ref 0.3–1.2)
Total Protein: 6.2 g/dL — ABNORMAL LOW (ref 6.5–8.1)

## 2020-02-15 LAB — APTT: aPTT: 28 seconds (ref 24–36)

## 2020-02-15 LAB — PROTIME-INR
INR: 1 (ref 0.8–1.2)
Prothrombin Time: 12.6 seconds (ref 11.4–15.2)

## 2020-02-15 LAB — HEMOGLOBIN A1C
Hgb A1c MFr Bld: 6 % — ABNORMAL HIGH (ref 4.8–5.6)
Mean Plasma Glucose: 125.5 mg/dL

## 2020-02-15 LAB — CBC
HCT: 33.8 % — ABNORMAL LOW (ref 36.0–46.0)
Hemoglobin: 11.2 g/dL — ABNORMAL LOW (ref 12.0–15.0)
MCH: 32 pg (ref 26.0–34.0)
MCHC: 33.1 g/dL (ref 30.0–36.0)
MCV: 96.6 fL (ref 80.0–100.0)
Platelets: 188 10*3/uL (ref 150–400)
RBC: 3.5 MIL/uL — ABNORMAL LOW (ref 3.87–5.11)
RDW: 13.2 % (ref 11.5–15.5)
WBC: 6.3 10*3/uL (ref 4.0–10.5)
nRBC: 0 % (ref 0.0–0.2)

## 2020-02-15 LAB — SURGICAL PCR SCREEN
MRSA, PCR: NEGATIVE
Staphylococcus aureus: NEGATIVE

## 2020-02-15 LAB — GLUCOSE, CAPILLARY: Glucose-Capillary: 113 mg/dL — ABNORMAL HIGH (ref 70–99)

## 2020-02-15 NOTE — Congregational Nurse Program (Signed)
  Dept: [facility] Nurse Program Note  Date of Encounter: 02/15/2020  Past Medical History: Past Medical History:  Diagnosis Date  . Allergy   . Aortic atherosclerosis ([place])   . Arthritis   . Diabetes mellitus    type 2   . Diverticulosis   . Dyspepsia   . Fecal incontinence   . Gastritis   . GERD (gastroesophageal reflux disease)   . 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 Hemorrhoids   . Hiatal hernia   . Hyperlipidemia   . Hyperlipidemia   . Hypertension   . Hypothyroidism   . IBS (irritable bowel syndrome)   . Iron overload 07/13/2016  . 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 Osteoporosis   . UTI (lower urinary tract infection)   . Vitamin D deficiency     Encounter Details:  CNP Questionnaire - 02/15/20 1000      Questionnaire   Do you give verbal consent to treat you today? Yes    Visit Setting Other   pre-op visiting   Location Patient Served At Not Applicable   [facility]   Patient Status Immigrant    Medical Provider Yes    Insurance Medicare    Intervention Educate   pre-op visit   Housing/Utilities No permanent housing    Transportation Need transportation assistance   ride given to hosp. to home round trip   Interpersonal Safety --   safety   Food --   none   Medication --   na   Referrals Other   pre-op visit hospital setting   Screening Referrals --   pre-op visit labs ekg   ED Visit Averted --   NA   Life-Saving Intervention Made --   NA        pre-op visit [facility]. 02/25/20 scheduled  Lt Knee surg by Dr Wynelle Link Interpret and explain procedure, need Covid test 12/9 /21. meds list given to pharmacy tech.  Information given pt"s daughter who is coming to pt's house on.02/22/20

## 2020-02-15 NOTE — Progress Notes (Signed)
DUE TO COVID-19 ONLY ONE VISITOR IS ALLOWED TO COME WITH YOU AND STAY IN THE WAITING ROOM ONLY DURING PRE OP AND PROCEDURE DAY OF SURGERY. THE 1 VISITOR  MAY VISIT WITH YOU AFTER SURGERY IN YOUR PRIVATE ROOM DURING VISITING HOURS ONLY!  YOU NEED TO HAVE A COVID 19 TEST ON_12/11/2019 ______ @_______ , THIS TEST MUST BE DONE BEFORE SURGERY,  COVID TESTING SITE [address] [place] [address], IT IS ON THE RIGHT GOING OUT [address] APPROXIMATELY  2 MINUTES PAST [employer] ON THE RIGHT. ONCE YOUR COVID TEST IS COMPLETED,  PLEASE BEGIN THE QUARANTINE INSTRUCTIONS AS OUTLINED IN YOUR HANDOUT.                CYBILL URIEGAS  02/15/2020   Your procedure is scheduled on: 02/25/2020    Report to [facility] Main  Entrance   Report to admitting at    1230pm     Call this number if you have problems the morning of surgery [phone]    REMEMBER: NO  SOLID FOOD CANDY OR GUM AFTER MIDNIGHT. CLEAR LIQUIDS UNTIL  1200noon        . NOTHING BY MOUTH EXCEPT CLEAR LIQUIDS UNTIL    . PLEASE FINISH ENSURE DRINK PER SURGEON ORDER  WHICH NEEDS TO BE COMPLETED AT    1200 noon   .      CLEAR LIQUID DIET   Foods Allowed                                                                    Coffee and tea, regular and decaf                            Fruit ices (not with fruit pulp)                                      Iced Popsicles                                    Carbonated beverages, regular and diet                                    Cranberry, grape and apple juices Sports drinks like Gatorade Lightly seasoned clear broth or consume(fat free) Sugar, honey syrup ___________________________________________________________________      BRUSH YOUR TEETH MORNING OF SURGERY AND RINSE YOUR MOUTH OUT, NO CHEWING GUM CANDY OR MINTS.     Take these medicines the morning of surgery with A SIP OF WATER: synthroid, prilosec   DO NOT TAKE ANY DIABETIC MEDICATIONS DAY OF YOUR  SURGERY                               You may not have any metal on your body including hair pins and              piercings  Do  not wear jewelry, make-up, lotions, powders or perfumes, deodorant             Do not wear nail polish on your fingernails.  Do not shave  48 hours prior to surgery.              Men may shave face and neck.   Do not bring valuables to the hospital. [place].  Contacts, dentures or bridgework may not be worn into surgery.  Leave suitcase in the car. After surgery it may be brought to your room.     Patients discharged the day of surgery will not be allowed to drive home. IF YOU ARE HAVING SURGERY AND GOING HOME THE SAME DAY, YOU MUST HAVE AN ADULT TO DRIVE YOU HOME AND BE WITH YOU FOR 24 HOURS. YOU MAY GO HOME BY TAXI OR UBER OR ORTHERWISE, BUT AN ADULT MUST ACCOMPANY YOU HOME AND STAY WITH YOU FOR 24 HOURS.  Name and phone number of your driver:  Special Instructions: N/A              Please read over the following fact sheets you were given: _____________________________________________________________________  [facility] - Preparing for Surgery Before surgery, you can play an important role.  Because skin is not sterile, your skin needs to be as free of germs as possible.  You can reduce the number of germs on your skin by washing with CHG (chlorahexidine gluconate) soap before surgery.  CHG is an antiseptic cleaner which kills germs and bonds with the skin to continue killing germs even after washing. Please DO NOT use if you have an allergy to CHG or antibacterial soaps.  If your skin becomes reddened/irritated stop using the CHG and inform your nurse when you arrive at Short Stay. Do not shave (including legs and underarms) for at least 48 hours prior to the first CHG shower.  You may shave your face/neck. Please follow these instructions carefully:  1.  Shower with CHG Soap the night before surgery and the   morning of Surgery.  2.  If you choose to wash your hair, wash your hair first as usual with your  normal  shampoo.  3.  After you shampoo, rinse your hair and body thoroughly to remove the  shampoo.                           4.  Use CHG as you would any other liquid soap.  You can apply chg directly  to the skin and wash                       Gently with a scrungie or clean washcloth.  5.  Apply the CHG Soap to your body ONLY FROM THE NECK DOWN.   Do not use on face/ open                           Wound or open sores. Avoid contact with eyes, ears mouth and genitals (private parts).                       Wash face,  Genitals (private parts) with your normal soap.             6.  Wash thoroughly,  paying special attention to the area where your surgery  will be performed.  7.  Thoroughly rinse your body with warm water from the neck down.  8.  DO NOT shower/wash with your normal soap after using and rinsing off  the CHG Soap.                9.  Pat yourself dry with a clean towel.            10.  Wear clean pajamas.            11.  Place clean sheets on your bed the night of your first shower and do not  sleep with pets. Day of Surgery : Do not apply any lotions/deodorants the morning of surgery.  Please wear clean clothes to the hospital/surgery center.  FAILURE TO FOLLOW THESE INSTRUCTIONS MAY RESULT IN THE CANCELLATION OF YOUR SURGERY PATIENT SIGNATURE_________________________________  NURSE SIGNATURE__________________________________  ________________________________________________________________________

## 2020-02-18 DIAGNOSIS — Z01818 Encounter for other preprocedural examination: Secondary | ICD-10-CM | POA: Diagnosis not present

## 2020-02-18 NOTE — Progress Notes (Signed)
Anesthesia Review:  PCP: Janie Morning clearance dated 02/18/2020 on chart with office visit note.  Cardiologist : Chest x-ray : EKG : 02/15/2020   Echo : Stress test: Cardiac Cath :  Activity level:  Sleep Study/ CPAP : Fasting Blood Sugar :      / Checks Blood Sugar -- times a day:   Blood Thinner/ Instructions /Last Dose: ASA / Instructions/ Last Dose :

## 2020-02-20 NOTE — H&P (Addendum)
UNILATERAL KNEE ARTHROPLASTY ADMISSION H&P  Patient is being admitted for Left Unilateral Knee Arthroplasty.  Subjective:  Chief Complaint: Left knee pain.  HPI: Lauren [name], 79 y.o. female has a history of pain and functional disability in the left knee due to arthritis and has failed non-surgical conservative treatments for greater than 12 weeks to include NSAID's and/or analgesics, corticosteriod injections and activity modification. Onset of symptoms was gradual, starting several years ago with gradually worsening course since that time. The patient noted no past surgery on the left knee.  Patient currently rates pain in the left knee at 5 out of 10 with activity. Patient has pain that interferes with activities of daily living. Patient has evidence of significant medial joint space narrowing on AP view of her left knee and what appears to be a focal area on bone-on-bone medially on the lateral view by imaging studies performed on 12/27/19. There is no active infection.  Patient Active Problem List   Diagnosis Date Noted  . 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 Iron overload 07/13/2016  . Knee pain, bilateral 07/06/2016  . Upset stomach 05/18/2016  . 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 Ear ringing 02/23/2016  . Fecal incontinence 01/03/2015  . Nausea without vomiting 01/03/2015  . Periumbilical abdominal pain 01/03/2015  . HTN (hypertension) 01/25/2011  . Hyperlipidemia 01/25/2011  . Diabetes mellitus 01/25/2011  . GERD (gastroesophageal reflux disease) 01/25/2011  . Osteoporosis 01/25/2011  . Hypothyroidism 01/25/2011    Past Medical History:  Diagnosis Date  . Allergy   . Aortic atherosclerosis ([place])   . Arthritis   . Diabetes mellitus    type 2   . Diverticulosis   . Dyspepsia   . Fecal incontinence   . Gastritis   . GERD (gastroesophageal reflux disease)   . 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 Hemorrhoids   . Hiatal hernia   . Hyperlipidemia   . Hyperlipidemia   . Hypertension   . Hypothyroidism   . IBS (irritable bowel syndrome)   . Iron overload 07/13/2016  . 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 Osteoporosis   . UTI (lower urinary tract infection)   . Vitamin D deficiency     Past Surgical History:  Procedure Laterality Date  . COLONOSCOPY      Prior to Admission medications   Medication Sig Start Date End Date Taking? Authorizing Provider  ascorbic acid (VITAMIN C) 500 MG tablet Take 500 mg by mouth daily.   Yes [provider]  Cholecalciferol (VITAMIN D3) 2000 UNITS TABS Take 1 tablet by mouth daily.   Yes [provider]  diclofenac Sodium (VOLTAREN) 1 % GEL Apply topically 3 (three) times daily as needed (pain).   Yes [provider]  glucosamine-chondroitin 500-400 MG tablet Take 1 tablet by mouth 3 (three) times daily.   Yes [provider]  levothyroxine (SYNTHROID, LEVOTHROID) 75 MCG tablet Take 75 mcg by mouth daily.     Yes [provider]  losartan (COZAAR) 100 MG tablet Take 100 mg by mouth daily.     Yes [provider]  Multiple Vitamin (MULTIVITAMIN) tablet Take 1 tablet by mouth daily.   Yes [provider]  Omega-3 Fatty Acids (FISH OIL) 500 MG CAPS Take 500 mg by mouth 2 (two) times daily.   Yes [provider]  omeprazole (PRILOSEC) 40 MG capsule Take 1  capsule (40 mg total) by mouth daily. 03/12/15  Yes Pyrtle, Lajuan Lines, MD  pravastatin (PRAVACHOL) 40 MG tablet Take 40 mg by mouth daily. 01/12/20  Yes [provider]  sitaGLIPtan-metformin (JANUMET) 50-500 MG per tablet Take 1 tablet by mouth 2 (two) times daily with a meal.     Yes [provider]  vitamin B-12 (CYANOCOBALAMIN) 100 MCG tablet Take 100 mcg by mouth daily.   Yes [provider]    Allergies  Allergen Reactions  . Pyridium [Phenazopyridine Hcl] Other (See  Comments)    Hemolytic anemia  . Sulfa Antibiotics     Potential allergy: oxidizing drug: methemoglobinemia/hemolysis on pyridium  . Ciprofloxacin Hcl     Sx's almost like a heart attack  . Esomeprazole Magnesium Nausea Only    Pain and nausea  . Macrodantin [Nitrofurantoin Macrocrystal] Other (See Comments)    GI Upset  . Pantoprazole Sodium Nausea Only    abd pain and nausea  . Tramadol Nausea Only    Social History   Socioeconomic History  . Marital status: Widowed    Spouse name: Not on file  . Number of children: 3  . Years of education: Not on file  . Highest education level: Not on file  Occupational History  . Occupation: retired    Fish farm manager: RETIRED  Tobacco Use  . Smoking status: Never Smoker  . Smokeless tobacco: Never Used  Substance and Sexual Activity  . Alcohol use: No  . Drug use: No  . Sexual activity: Never  Other Topics Concern  . Not on file  Social History Narrative  . Not on file   Social Determinants of Health   Financial Resource Strain:   . Difficulty of Paying Living Expenses: Not on file  Food Insecurity:   . Worried About Charity fundraiser in the Last Year: Not on file  . Ran Out of Food in the Last Year: Not on file  Transportation Needs:   . Lack of Transportation (Medical): Not on file  . Lack of Transportation (Non-Medical): Not on file  Physical Activity:   . Days of Exercise per Week: Not on file  . Minutes of Exercise per Session: Not on file  Stress:   . Feeling of Stress : Not on file  Social Connections:   . Frequency of Communication with Friends and Family: Not on file  . Frequency of Social Gatherings with Friends and Family: Not on file  . Attends Religious Services: Not on file  . Active Member of Clubs or Organizations: Not on file  . Attends Archivist Meetings: Not on file  . Marital Status: Not on file  Intimate Partner Violence:   . Fear of Current or Ex-Partner: Not on file  . Emotionally  Abused: Not on file  . Physically Abused: Not on file  . Sexually Abused: Not on file      Tobacco Use: Low Risk   . Smoking Tobacco Use: Never Smoker  . Smokeless Tobacco Use: Never Used   Social History   Substance and Sexual Activity  Alcohol Use No    Family History  Problem Relation Age of Onset  . Colon cancer Neg Hx     ROS: Constitutional: no fever, no significant weight gain, weight loss  Eyes: no dry eyes, no irritation, no vision change, no sore throat  Cardiovascular: no chest pain, no palpitations  Respiratory: no cough, no shortness of breath, No COPD  Gastrointestinal: no vomiting, no diarrhea, not  vomiting blood  Genitourinary: no blood in urine, difficulty urinating  Musculoskeletal: no swelling in Joints, Joint Pain  Skin: no rashes, no varicose veins  Neurologic: no numbness, no seizures, no dizziness, no difficulty with balance  Endocrine: temperature intolerance (normal) to heat  Hematologic/Lymphatic: no swollen glands, no bruising  Objective:  Physical Exam: Well nourished and well developed.  General: Alert and oriented x3, cooperative and pleasant, no acute distress.  Head: normocephalic, atraumatic, neck supple.  Eyes: EOMI.  Respiratory: breath sounds clear in all fields, no wheezing, rales, or rhonchi. Cardiovascular: Regular rate and rhythm, no murmurs, gallops or rubs.  Abdomen: non-tender to palpation and soft, normoactive bowel sounds. Musculoskeletal: Bilateral Hip Exam: The range of motion: normal without discomfort.   Right Knee Exam:  No effusion present. No swelling present.  The range of motion is: 0 to 125 degrees.  No crepitus on range of motion of the knee.  No medial joint line tenderness.  No lateral joint line tenderness.  The knee is stable.    Left Knee Exam:  No effusion present. No swelling present.  The range of motion is: 0 to 125 or 130 degrees.  No crepitus on range of motion of the knee.   Positive medial joint line tenderness.  No lateral joint line tenderness.  The knee is stable.   Calves soft and nontender. Motor function intact in LE. Strength 5/5 LE bilaterally. Neuro: Distal pulses 2+. Sensation to light touch intact in LE.  Vital signs in last 24 hours:    Imaging Review AP and lateral of the left knee dated 12/27/2019 demonstrate significant medial joint space narrowing on AP view and what appears to be a focal area on bone-on-bone medially on the lateral view.   Assessment/Plan:  End stage arthritis, left knee   The patient history, physical examination, clinical judgment of the provider and imaging studies are consistent with end stage degenerative joint disease of the left knee and total knee arthroplasty is deemed medically necessary. The treatment options including medical management, injection therapy arthroscopy and arthroplasty were discussed at length. The risks and benefits of total knee arthroplasty were presented and reviewed. The risks due to aseptic loosening, infection, stiffness, patella tracking problems, thromboembolic complications and other imponderables were discussed. The patient acknowledged the explanation, agreed to proceed with the plan and consent was signed. Patient is being admitted for inpatient treatment for surgery, pain control, PT, OT, prophylactic antibiotics, VTE prophylaxis, progressive ambulation and ADLs and discharge planning. The patient is planning to be discharged home with her daughter..   Patient's anticipated LOS is less than 2 midnights, meeting these requirements: - Younger than 66 - Lives within 1 hour of care - Has a competent adult at home to recover with post-op recover - NO history of  - Chronic pain requiring opiods  - Diabetes  - Coronary Artery Disease  - Heart failure  - Heart attack  - Stroke  - DVT/VTE  - Cardiac arrhythmia  - Respiratory Failure/COPD  - Renal failure  - Anemia  - Advanced Liver  disease  Risks and benefits of the surgery were discussed with the patient and Dr. Wynelle Link at their previous office visit, and the patient has elected to move forward with the aforementioned surgery. Post-operative care plans were discussed with the patient today and all patient questions were answered.   Therapy Plans: [facility] Disposition: Home with daughter Planned DVT Prophylaxis: Aspirin DME Needed: walker and toilet PCP: Dr. Janie Morning (clearance received) TXA: IV  Allergies: Protonix (severe vomiting), Macrodantin, Pyridium (Anaphylactic) Anesthesia Concerns: None BMI: 22.1 Last HgbA1c: 6.0   Pharmacy: [facility] in [place]  - Patient was instructed on what medications to stop prior to surgery. - Follow-up visit in 2 weeks with Dr. Wynelle Link - Begin physical therapy following surgery - Pre-operative lab work as pre-surgical testing - Prescriptions will be provided in hospital at time of discharge  Fenton Foy, [place], [facility] [place]

## 2020-02-21 ENCOUNTER — Other Ambulatory Visit (HOSPITAL_COMMUNITY)
Admission: RE | Admit: 2020-02-21 | Discharge: 2020-02-21 | Disposition: A | Discharge status: Home / Self Care | Payer: Medicare Other | Source: Ambulatory Visit | Attending: Orthopedic Surgery | Admitting: Orthopedic Surgery

## 2020-02-21 DIAGNOSIS — Z20822 Contact with and (suspected) exposure to covid-19: Secondary | ICD-10-CM | POA: Diagnosis not present

## 2020-02-21 DIAGNOSIS — Z01812 Encounter for preprocedural laboratory examination: Secondary | ICD-10-CM | POA: Diagnosis not present

## 2020-02-21 LAB — SARS CORONAVIRUS 2 (TAT 6-24 HRS): SARS Coronavirus 2: NEGATIVE

## 2020-02-23 ENCOUNTER — Telehealth: Payer: Self-pay | Admitting: *Deleted

## 2020-02-23 NOTE — Telephone Encounter (Signed)
[name]  For changing operational time on Monday 02/25/2020. Need to be there @ 11am. Notified pt. Few instruction reminded pt. No solid food after midnight on 02/24/2020 Drink G2 before 10;30am on surgery day. Clean with [place] before night and the day of operation date, change to clean bed sheets and clothes. No medicine take except thyroid med and antiacid. Pt understood. pt'd daughter will be with pt.

## 2020-02-24 MED ORDER — BUPIVACAINE LIPOSOME 1.3 % IJ SUSP
20.0000 mL | Freq: Once | INTRAMUSCULAR | Status: DC
  Filled 2020-02-24: qty 20

## 2020-02-25 ENCOUNTER — Ambulatory Visit (HOSPITAL_COMMUNITY): Payer: Medicare Other | Admitting: Certified Registered Nurse Anesthetist

## 2020-02-25 ENCOUNTER — Encounter (HOSPITAL_COMMUNITY)
Admission: RE | Disposition: A | Discharge status: Home / Self Care | Payer: Self-pay | Source: Home / Self Care | Attending: Orthopedic Surgery

## 2020-02-25 ENCOUNTER — Observation Stay (HOSPITAL_COMMUNITY)
Admission: RE | Admit: 2020-02-25 | Discharge: 2020-02-26 | Disposition: A | Discharge status: Home / Self Care | Payer: Medicare Other | Attending: Orthopedic Surgery | Admitting: Orthopedic Surgery

## 2020-02-25 ENCOUNTER — Other Ambulatory Visit: Payer: Self-pay

## 2020-02-25 ENCOUNTER — Ambulatory Visit (HOSPITAL_COMMUNITY): Payer: Medicare Other | Admitting: Physician Assistant

## 2020-02-25 ENCOUNTER — Encounter (HOSPITAL_COMMUNITY): Payer: Self-pay | Admitting: Orthopedic Surgery

## 2020-02-25 DIAGNOSIS — G8918 Other acute postprocedural pain: Secondary | ICD-10-CM | POA: Diagnosis not present

## 2020-02-25 DIAGNOSIS — M25562 Pain in left knee: Secondary | ICD-10-CM | POA: Diagnosis present

## 2020-02-25 DIAGNOSIS — M1712 Unilateral primary osteoarthritis, left knee: Secondary | ICD-10-CM | POA: Diagnosis not present

## 2020-02-25 DIAGNOSIS — Z79899 Other long term (current) drug therapy: Secondary | ICD-10-CM | POA: Insufficient documentation

## 2020-02-25 DIAGNOSIS — E039 Hypothyroidism, unspecified: Secondary | ICD-10-CM | POA: Diagnosis not present

## 2020-02-25 DIAGNOSIS — Z7984 Long term (current) use of oral hypoglycemic drugs: Secondary | ICD-10-CM | POA: Diagnosis not present

## 2020-02-25 DIAGNOSIS — I10 Essential (primary) hypertension: Secondary | ICD-10-CM | POA: Insufficient documentation

## 2020-02-25 DIAGNOSIS — E119 Type 2 diabetes mellitus without complications: Secondary | ICD-10-CM | POA: Diagnosis not present

## 2020-02-25 DIAGNOSIS — M179 Osteoarthritis of knee, unspecified: Secondary | ICD-10-CM | POA: Diagnosis present

## 2020-02-25 HISTORY — PX: PARTIAL KNEE ARTHROPLASTY: SHX2174

## 2020-02-25 LAB — GLUCOSE, CAPILLARY
Glucose-Capillary: 106 mg/dL — ABNORMAL HIGH (ref 70–99)
Glucose-Capillary: 138 mg/dL — ABNORMAL HIGH (ref 70–99)
Glucose-Capillary: 339 mg/dL — ABNORMAL HIGH (ref 70–99)

## 2020-02-25 LAB — ABO/RH: ABO/RH(D): B POS

## 2020-02-25 LAB — TYPE AND SCREEN
ABO/RH(D): B POS
Antibody Screen: NEGATIVE

## 2020-02-25 SURGERY — ARTHROPLASTY, KNEE, UNICOMPARTMENTAL
Anesthesia: Spinal | Site: Knee | Laterality: Left

## 2020-02-25 MED ORDER — PROPOFOL 10 MG/ML IV BOLUS
INTRAVENOUS | Status: AC
  Filled 2020-02-25: qty 20

## 2020-02-25 MED ORDER — SODIUM CHLORIDE 0.9 % IV SOLN: INTRAVENOUS | Status: DC

## 2020-02-25 MED ORDER — OXYCODONE HCL 5 MG PO TABS: 10.0000 mg | ORAL_TABLET | ORAL | Status: DC | PRN

## 2020-02-25 MED ORDER — METOCLOPRAMIDE HCL 5 MG/ML IJ SOLN: 5.0000 mg | Freq: Three times a day (TID) | INTRAMUSCULAR | Status: DC | PRN

## 2020-02-25 MED ORDER — SODIUM CHLORIDE 0.9 % IR SOLN
Status: DC | PRN
  Administered 2020-02-25: 1000 mL

## 2020-02-25 MED ORDER — INSULIN ASPART 100 UNIT/ML ~~LOC~~ SOLN
0.0000 [IU] | Freq: Every day | SUBCUTANEOUS | Status: DC
  Administered 2020-02-25: 22:00:00 4 [IU] via SUBCUTANEOUS

## 2020-02-25 MED ORDER — TRANEXAMIC ACID-NACL 1000-0.7 MG/100ML-% IV SOLN
1000.0000 mg | INTRAVENOUS | Status: AC
  Administered 2020-02-25: 1000 mg via INTRAVENOUS
  Filled 2020-02-25: qty 100

## 2020-02-25 MED ORDER — BUPIVACAINE LIPOSOME 1.3 % IJ SUSP
INTRAMUSCULAR | Status: DC | PRN
  Administered 2020-02-25: 20 mL

## 2020-02-25 MED ORDER — PANTOPRAZOLE SODIUM 40 MG PO TBEC: 40.0000 mg | DELAYED_RELEASE_TABLET | Freq: Every day | ORAL | Status: DC

## 2020-02-25 MED ORDER — BISACODYL 10 MG RE SUPP: 10.0000 mg | Freq: Every day | RECTAL | Status: DC | PRN

## 2020-02-25 MED ORDER — PHENOL 1.4 % MT LIQD: 1.0000 | OROMUCOSAL | Status: DC | PRN

## 2020-02-25 MED ORDER — METOCLOPRAMIDE HCL 5 MG PO TABS: 5.0000 mg | ORAL_TABLET | Freq: Three times a day (TID) | ORAL | Status: DC | PRN

## 2020-02-25 MED ORDER — OXYCODONE HCL 5 MG PO TABS: 5.0000 mg | ORAL_TABLET | ORAL | Status: DC | PRN

## 2020-02-25 MED ORDER — LACTATED RINGERS IV SOLN: INTRAVENOUS | Status: DC

## 2020-02-25 MED ORDER — 0.9 % SODIUM CHLORIDE (POUR BTL) OPTIME
TOPICAL | Status: DC | PRN
Start: 2020-02-25 — End: 2020-02-25
  Administered 2020-02-25: 15:00:00 1000 mL

## 2020-02-25 MED ORDER — PRAVASTATIN SODIUM 20 MG PO TABS: 40.0000 mg | ORAL_TABLET | Freq: Every day | ORAL | Status: DC

## 2020-02-25 MED ORDER — ONDANSETRON HCL 4 MG PO TABS: 4.0000 mg | ORAL_TABLET | Freq: Four times a day (QID) | ORAL | Status: DC | PRN

## 2020-02-25 MED ORDER — ORAL CARE MOUTH RINSE: 15.0000 mL | Freq: Once | OROMUCOSAL | Status: AC

## 2020-02-25 MED ORDER — CEFAZOLIN SODIUM-DEXTROSE 2-4 GM/100ML-% IV SOLN
2.0000 g | Freq: Four times a day (QID) | INTRAVENOUS | Status: AC
Start: 2020-02-25 — End: 2020-02-26
  Administered 2020-02-25 – 2020-02-26 (×2): 2 g via INTRAVENOUS
  Filled 2020-02-25 (×2): qty 100

## 2020-02-25 MED ORDER — DOCUSATE SODIUM 100 MG PO CAPS
100.0000 mg | ORAL_CAPSULE | Freq: Two times a day (BID) | ORAL | Status: DC
  Administered 2020-02-25: 22:00:00 100 mg via ORAL
  Filled 2020-02-25: qty 1

## 2020-02-25 MED ORDER — INSULIN ASPART 100 UNIT/ML ~~LOC~~ SOLN: 0.0000 [IU] | Freq: Three times a day (TID) | SUBCUTANEOUS | Status: DC

## 2020-02-25 MED ORDER — PROPOFOL 500 MG/50ML IV EMUL
INTRAVENOUS | Status: DC | PRN
  Administered 2020-02-25: 100 ug/kg/min via INTRAVENOUS

## 2020-02-25 MED ORDER — PHENYLEPHRINE 40 MCG/ML (10ML) SYRINGE FOR IV PUSH (FOR BLOOD PRESSURE SUPPORT)
PREFILLED_SYRINGE | INTRAVENOUS | Status: DC | PRN
  Administered 2020-02-25: 120 ug via INTRAVENOUS

## 2020-02-25 MED ORDER — STERILE WATER FOR IRRIGATION IR SOLN
Status: DC | PRN
  Administered 2020-02-25: 1000 mL

## 2020-02-25 MED ORDER — FLEET ENEMA 7-19 GM/118ML RE ENEM: 1.0000 | ENEMA | Freq: Once | RECTAL | Status: DC | PRN

## 2020-02-25 MED ORDER — SODIUM CHLORIDE (PF) 0.9 % IJ SOLN
INTRAMUSCULAR | Status: AC
  Filled 2020-02-25: qty 50

## 2020-02-25 MED ORDER — SODIUM CHLORIDE (PF) 0.9 % IJ SOLN
INTRAMUSCULAR | Status: DC | PRN
  Administered 2020-02-25: 60 mL

## 2020-02-25 MED ORDER — CEFAZOLIN SODIUM-DEXTROSE 2-4 GM/100ML-% IV SOLN
2.0000 g | INTRAVENOUS | Status: AC
  Administered 2020-02-25: 2 g via INTRAVENOUS
  Filled 2020-02-25: qty 100

## 2020-02-25 MED ORDER — TRAMADOL HCL 50 MG PO TABS: 50.0000 mg | ORAL_TABLET | Freq: Four times a day (QID) | ORAL | Status: DC

## 2020-02-25 MED ORDER — DEXAMETHASONE SODIUM PHOSPHATE 10 MG/ML IJ SOLN
8.0000 mg | Freq: Once | INTRAMUSCULAR | Status: AC
  Administered 2020-02-25: 5 mg via INTRAVENOUS

## 2020-02-25 MED ORDER — MENTHOL 3 MG MT LOZG: 1.0000 | LOZENGE | OROMUCOSAL | Status: DC | PRN

## 2020-02-25 MED ORDER — LEVOTHYROXINE SODIUM 75 MCG PO TABS
75.0000 ug | ORAL_TABLET | Freq: Every day | ORAL | Status: DC
  Administered 2020-02-26: 06:00:00 75 ug via ORAL
  Filled 2020-02-25: qty 1

## 2020-02-25 MED ORDER — METHOCARBAMOL 500 MG PO TABS: 500.0000 mg | ORAL_TABLET | Freq: Four times a day (QID) | ORAL | Status: DC | PRN

## 2020-02-25 MED ORDER — ASPIRIN EC 325 MG PO TBEC: 325.0000 mg | DELAYED_RELEASE_TABLET | Freq: Two times a day (BID) | ORAL | Status: DC

## 2020-02-25 MED ORDER — POVIDONE-IODINE 10 % EX SWAB
2.0000 "application " | Freq: Once | CUTANEOUS | Status: AC
  Administered 2020-02-25: 2 via TOPICAL

## 2020-02-25 MED ORDER — ACETAMINOPHEN 500 MG PO TABS
1000.0000 mg | ORAL_TABLET | Freq: Four times a day (QID) | ORAL | Status: DC
  Administered 2020-02-25 – 2020-02-26 (×3): 1000 mg via ORAL
  Filled 2020-02-25 (×3): qty 2

## 2020-02-25 MED ORDER — EPHEDRINE SULFATE-NACL 50-0.9 MG/10ML-% IV SOSY
PREFILLED_SYRINGE | INTRAVENOUS | Status: DC | PRN
  Administered 2020-02-25 (×2): 10 mg via INTRAVENOUS

## 2020-02-25 MED ORDER — ACETAMINOPHEN 10 MG/ML IV SOLN
1000.0000 mg | Freq: Four times a day (QID) | INTRAVENOUS | Status: DC
  Administered 2020-02-25: 1000 mg via INTRAVENOUS
  Filled 2020-02-25: qty 100

## 2020-02-25 MED ORDER — ONDANSETRON HCL 4 MG/2ML IJ SOLN: 4.0000 mg | Freq: Once | INTRAMUSCULAR | Status: DC | PRN

## 2020-02-25 MED ORDER — CHLORHEXIDINE GLUCONATE 0.12 % MT SOLN
15.0000 mL | Freq: Once | OROMUCOSAL | Status: AC
  Administered 2020-02-25: 11:00:00 15 mL via OROMUCOSAL

## 2020-02-25 MED ORDER — SODIUM CHLORIDE (PF) 0.9 % IJ SOLN
INTRAMUSCULAR | Status: AC
  Filled 2020-02-25: qty 10

## 2020-02-25 MED ORDER — ONDANSETRON HCL 4 MG/2ML IJ SOLN: 4.0000 mg | Freq: Four times a day (QID) | INTRAMUSCULAR | Status: DC | PRN

## 2020-02-25 MED ORDER — PROPOFOL 1000 MG/100ML IV EMUL
INTRAVENOUS | Status: AC
  Filled 2020-02-25: qty 100

## 2020-02-25 MED ORDER — MORPHINE SULFATE (PF) 2 MG/ML IV SOLN: 0.5000 mg | INTRAVENOUS | Status: DC | PRN

## 2020-02-25 MED ORDER — HYDROMORPHONE HCL 1 MG/ML IJ SOLN: 0.2500 mg | INTRAMUSCULAR | Status: DC | PRN

## 2020-02-25 MED ORDER — ONDANSETRON HCL 4 MG/2ML IJ SOLN
INTRAMUSCULAR | Status: DC | PRN
  Administered 2020-02-25: 4 mg via INTRAVENOUS

## 2020-02-25 MED ORDER — POLYETHYLENE GLYCOL 3350 17 G PO PACK: 17.0000 g | PACK | Freq: Every day | ORAL | Status: DC | PRN

## 2020-02-25 MED ORDER — ONDANSETRON HCL 4 MG/2ML IJ SOLN
INTRAMUSCULAR | Status: AC
  Filled 2020-02-25: qty 2

## 2020-02-25 MED ORDER — FENTANYL CITRATE (PF) 100 MCG/2ML IJ SOLN
50.0000 ug | Freq: Once | INTRAMUSCULAR | Status: AC
  Administered 2020-02-25: 13:00:00 50 ug via INTRAVENOUS
  Filled 2020-02-25: qty 2

## 2020-02-25 MED ORDER — METHOCARBAMOL 500 MG IVPB - SIMPLE MED
500.0000 mg | Freq: Four times a day (QID) | INTRAVENOUS | Status: DC | PRN
  Filled 2020-02-25: qty 50

## 2020-02-25 MED ORDER — LOSARTAN POTASSIUM 50 MG PO TABS: 100.0000 mg | ORAL_TABLET | Freq: Every day | ORAL | Status: DC

## 2020-02-25 MED ORDER — DIPHENHYDRAMINE HCL 12.5 MG/5ML PO ELIX: 12.5000 mg | ORAL_SOLUTION | ORAL | Status: DC | PRN

## 2020-02-25 MED ORDER — DEXAMETHASONE SODIUM PHOSPHATE 10 MG/ML IJ SOLN
INTRAMUSCULAR | Status: AC
  Filled 2020-02-25: qty 1

## 2020-02-25 SURGICAL SUPPLY — 59 items
BAG DECANTER FOR FLEXI CONT (MISCELLANEOUS) IMPLANT
BAG SPEC THK2 15X12 ZIP CLS (MISCELLANEOUS)
BAG ZIPLOCK 12X15 (MISCELLANEOUS) IMPLANT
BLADE SAW RECIPROCATING 77.5 (BLADE) ×3 IMPLANT
BLADE SAW SGTL 11.0X1.19X90.0M (BLADE) ×3 IMPLANT
BNDG ELASTIC 6X5.8 VLCR STR LF (GAUZE/BANDAGES/DRESSINGS) ×3 IMPLANT
BOWL SMART MIX CTS (DISPOSABLE) ×3 IMPLANT
BRNG TIB C 9 STRL KN LT MDL (Miscellaneous) ×1 IMPLANT
BSPLAT TIB C STRL KN LT MED TI (Knees) ×1 IMPLANT
CEMENT HV SMART SET (Cement) ×3 IMPLANT
CLOSURE STERI-STRIP 1/2X4 (GAUZE/BANDAGES/DRESSINGS) ×1
CLOSURE WOUND 1/2 X4 (GAUZE/BANDAGES/DRESSINGS) ×2
CLSR STERI-STRIP ANTIMIC 1/2X4 (GAUZE/BANDAGES/DRESSINGS) ×2 IMPLANT
COMPONENT TIB PERSONA SZ C LT (Knees) ×1 IMPLANT
COVER SURGICAL LIGHT HANDLE (MISCELLANEOUS) ×3 IMPLANT
COVER WAND RF STERILE (DRAPES) IMPLANT
CUFF TOURN SGL QUICK 34 (TOURNIQUET CUFF) ×3
CUFF TRNQT CYL 34X4.125X (TOURNIQUET CUFF) ×1 IMPLANT
DECANTER SPIKE VIAL GLASS SM (MISCELLANEOUS) IMPLANT
DRAPE U-SHAPE 47X51 STRL (DRAPES) ×3 IMPLANT
DRESSING AQUACEL AG SP 3.5X10 (GAUZE/BANDAGES/DRESSINGS) ×1 IMPLANT
DRSG ADAPTIC 3X8 NADH LF (GAUZE/BANDAGES/DRESSINGS) ×3 IMPLANT
DRSG AQUACEL AG SP 3.5X10 (GAUZE/BANDAGES/DRESSINGS) ×3
DRSG PAD ABDOMINAL 8X10 ST (GAUZE/BANDAGES/DRESSINGS) ×3 IMPLANT
DURAPREP 26ML APPLICATOR (WOUND CARE) ×3 IMPLANT
ELECT REM PT RETURN 15FT ADLT (MISCELLANEOUS) ×3 IMPLANT
GAUZE SPONGE 4X4 12PLY STRL (GAUZE/BANDAGES/DRESSINGS) ×3 IMPLANT
GLOVE BIO SURGEON STRL SZ7 (GLOVE) ×3 IMPLANT
GLOVE BIO SURGEON STRL SZ8 (GLOVE) ×3 IMPLANT
GLOVE BIOGEL PI IND STRL 7.0 (GLOVE) ×1 IMPLANT
GLOVE BIOGEL PI IND STRL 8 (GLOVE) ×2 IMPLANT
GLOVE BIOGEL PI INDICATOR 7.0 (GLOVE) ×2
GLOVE BIOGEL PI INDICATOR 8 (GLOVE) ×4
GOWN STRL REUS W/TWL LRG LVL3 (GOWN DISPOSABLE) ×9 IMPLANT
HANDPIECE INTERPULSE COAX TIP (DISPOSABLE) ×3
IMMOBILIZER KNEE 20 (SOFTGOODS) ×3
IMMOBILIZER KNEE 20 THIGH 36 (SOFTGOODS) ×1 IMPLANT
INSERT ARTSRF LT MDL SZ C 9 (Miscellaneous) ×3 IMPLANT
INSERTER TIP PARTIAL KNEE (MISCELLANEOUS) ×3 IMPLANT
KIT TURNOVER KIT A (KITS) IMPLANT
MANIFOLD NEPTUNE II (INSTRUMENTS) ×3 IMPLANT
NDL SAFETY ECLIPSE 18X1.5 (NEEDLE) IMPLANT
NEEDLE HYPO 18GX1.5 SHARP (NEEDLE)
NS IRRIG 1000ML POUR BTL (IV SOLUTION) ×3 IMPLANT
PACK TOTAL KNEE CUSTOM (KITS) ×3 IMPLANT
PADDING CAST COTTON 6X4 STRL (CAST SUPPLIES) ×3 IMPLANT
PENCIL SMOKE EVACUATOR (MISCELLANEOUS) IMPLANT
PERSONA PART KNEE SYS SZ C LT (Knees) ×3 IMPLANT
PROTECTOR NERVE ULNAR (MISCELLANEOUS) ×3 IMPLANT
SET HNDPC FAN SPRY TIP SCT (DISPOSABLE) ×1 IMPLANT
STRIP CLOSURE SKIN 1/2X4 (GAUZE/BANDAGES/DRESSINGS) ×4 IMPLANT
SUT MNCRL AB 4-0 PS2 18 (SUTURE) ×3 IMPLANT
SUT STRATAFIX 0 PDS 27 VIOLET (SUTURE) ×3
SUT VIC AB 2-0 CT1 27 (SUTURE) ×9
SUT VIC AB 2-0 CT1 TAPERPNT 27 (SUTURE) ×3 IMPLANT
SUTURE STRATFX 0 PDS 27 VIOLET (SUTURE) ×1 IMPLANT
SYR 50ML LL SCALE MARK (SYRINGE) ×3 IMPLANT
SYSTEM KNEE FEM CMT SZ 2 (Joint) ×3 IMPLANT
WATER STERILE IRR 1000ML POUR (IV SOLUTION) ×3 IMPLANT

## 2020-02-25 NOTE — Progress Notes (Signed)
Orthopedic Tech Progress Note Patient Details:  Lauren [name] March 21, 1940 [id number]  CPM Left Knee CPM Left Knee: On Left Knee Flexion (Degrees): 40 Left Knee Extension (Degrees): 10  Post Interventions Patient Tolerated: Well Instructions Provided: Care of device  Braulio Bosch 02/25/2020, 6:08 PM

## 2020-02-25 NOTE — Op Note (Signed)
OPERATIVE REPORT-UNICOMPARTMENTAL ARTHROPLASTY  PREOPERATIVE DIAGNOSIS: Medial compartment osteoarthritis, Left knee  POSTOPERATIVE DIAGNOSIS: Medial compartment osteoarthritis, Left knee  PROCEDURE: Left knee medial unicompartmental arthroplasty. ([facility])  SURGEON: Gaynelle Arabian, MD   ASSISTANT: Fenton Foy, PA-C  ANESTHESIA:  Adductor canal block and spinal.   ESTIMATED BLOOD LOSS: Minimal.   DRAINS: Hemovac x1.   TOURNIQUET TIME:  Total Tourniquet Time Documented: Thigh (Left) - 31 minutes Total: Thigh (Left) - 31 minutes   COMPLICATIONS: None.   CONDITION: Stable to recovery.   BRIEF CLINICAL NOTE:  Lauren [name] is a 79 y.o. female , who has  significant isolated medial compartment arthritis of the Left knee. The patient has had nonoperative management including injections of cortisone and viscous supplements. Unfortunately, the pain persists.  Radiograph showed isolated medial compartment bone-on-bone arthritis  with normal-appearing patellofemoral and lateral compartments. The patient presents now for left knee unicompartmental arthroplasty.   PROCEDURE IN DETAIL: After successful administration of  Adductor canal block and spinal anesthetic, a tourniquet was placed high on theLeft thigh and the Left lower extremity prepped and draped in usual sterile fashion. Extremity was wrapped in an Esmarch, knee flexed, and tourniquet inflated to 300 mmHg.       A midline incision was made with a 10 blade through subcutaneous tissue to the extensor mechanism. A fresh blade was used to make a  medial parapatellar arthrotomy. Soft tissue on the proximal medial  tibia subperiosteally elevated to the joint line with a knife and into  the semimembranosus bursa with a Cobb elevator. The patella was  subluxed laterally, and the knee flexed 90 degrees. The ACL was intact.  The marginal osteophytes on the medial femur and tibia were removed with  a rongeur. The medial meniscus was  also removed. The extramedullary tibial cutting guide was placed referencing Proximally at the medial aspect of the tibial tubercle and distally along the 2nd metatarsal axis and tibial crest. 4 degrees of posterior slope was dialed in and the block was pinned to remove 4 mm from the medial tibial surface.The cut is made with an oscillating saw and the cut bone removed.      The 9 mm spacer was then placed with the knee in extension with a stable fit. The distal femoral cutting guide was attached to the spacer in extension and pinned to make the distal femur cut with an oscillating saw.  The trial size 2 is placed and is most appropriate. The femoral preparation is completed with the posterior and chamfer cuts and drilling of the 2 lug holes. The trial size 2 femur is placed with excellent fit. The 9 mm spacer is placed and there is excellent balance through full range of motion.  The trial and the spacer are removed and tibia addressed.      The tibial sizer is placed and size C is most appropriate. The proximal tibia is prepared with the drill holes and keel for the size C The size C implant is placed with excellent fit. The trials are removed and cut bone surfaces prepared with pulsatile lavage. The cement is mixed and once ready for implantation The size C tibia and size 2 femur are cemented into place and all extruded cement removed. The 9 mm insert is then placed into the tibial tray  and locked into position. The knee is placed through a full range of motion with excellent stability.           I then injected the extensor  mechanism, periosteum of  the femur and subcu tissues, a total of 20 mL of Exparel mixed with 30  mL of saline. Wound was copiously irrigated with saline solution, and the arthrotomy closed over a Hemovac drain with a running #0 Stratofix  suture. The subcutaneous was closed with  interrupted 2-0 Vicryl and subcuticular running 4-0 Monocryl. The drain  was hooked to suction.  Incision cleaned and dried and Steri-Strips and  a bulky sterile dressing applied. The tourniquet was released after a  total time of 31 minutes. This was done after closing the extensor  mechanism. The wound was closed and a bulky sterile dressing was  applied. The operative limb was placed into a knee immobilizer, and the patient awakened and transported to recovery room in stable condition.       Please note that a surgical assistant was a medical necessity for this  procedure in order to perform it in a safe and expeditious manner.  Assistance was necessary for retracting vital ligaments, neurovascular  structures, as well as for proper positioning of the limb to allow for  appropriate bone cuts and appropriate placement of the prosthesis.    Dione Plover Blossom Crume, MD

## 2020-02-25 NOTE — Interval H&P Note (Signed)
History and Physical Interval Note:  02/25/2020 1:13 PM  Lauren [name]  has presented today for surgery, with the diagnosis of left knee medial compartment osteoarthritis.  The various methods of treatment have been discussed with the patient and family. After consideration of risks, benefits and other options for treatment, the patient has consented to  Procedure(s) with comments: UNICOMPARTMENTAL KNEE (Left) - 69min as a surgical intervention.  The patient's history has been reviewed, patient examined, no change in status, stable for surgery.  I have reviewed the patient's chart and labs.  Questions were answered to the patient's satisfaction.     [name]

## 2020-02-25 NOTE — Progress Notes (Signed)
Orthopedic Tech Progress Note Patient Details:  Lauren [name] January 13, 1941 [id number]  Ortho Devices Ortho Device/Splint Location: off cpm   Post Interventions Patient Tolerated: Well Instructions Provided: Care of device   Braulio Bosch 02/25/2020, 9:48 PM

## 2020-02-25 NOTE — Plan of Care (Signed)
  Problem: Education: Goal: Knowledge of General Education information will improve Description: Including pain rating scale, medication(s)/side effects and non-pharmacologic comfort measures Outcome: Progressing   Problem: Health Behavior/Discharge Planning: Goal: Ability to manage health-related needs will improve Outcome: Progressing   Problem: Activity: Goal: Risk for activity intolerance will decrease Outcome: Progressing   Problem: Nutrition: Goal: Adequate nutrition will be maintained Outcome: Progressing   Problem: Elimination: Goal: Will not experience complications related to bowel motility Outcome: Progressing   Problem: Pain Managment: Goal: General experience of comfort will improve Outcome: Progressing   Problem: Safety: Goal: Ability to remain free from injury will improve Outcome: Progressing   Problem: Education: Goal: Knowledge of the prescribed therapeutic regimen will improve Outcome: Progressing   Problem: Activity: Goal: Ability to avoid complications of mobility impairment will improve Outcome: Progressing Goal: Range of joint motion will improve Outcome: Progressing   Problem: Pain Management: Goal: Pain level will decrease with appropriate interventions Outcome: Progressing   Problem: Skin Integrity: Goal: Will show signs of wound healing Outcome: Progressing

## 2020-02-25 NOTE — Care Plan (Signed)
Ortho Bundle Case Management Note  Patient Details  Name: Lauren [name] MRN: [id number] Date of Birth: 30-Sep-1940                  Left [facility] on 02/25/20. DCP: Home with daughter, Micheline Maze He Guedes. 1 story home with 1 step. DME: RW & 3in1 ordered through [facility]. PT: EO 12/16   DME Arranged:  [facility] Agency:  [facility]  HH Arranged:    [facility] Agency:     Additional Comments: Please contact me with any questions of if this plan should need to change.  Marianne Sofia, RN,CCM [facility]  [phone] 02/25/2020, 1:19 PM

## 2020-02-25 NOTE — Plan of Care (Signed)
  Problem: Education: Goal: Knowledge of General Education information will improve Description: Including pain rating scale, medication(s)/side effects and non-pharmacologic comfort measures 02/25/2020 1703 by Butler Denmark I, RN Outcome: Progressing 02/25/2020 1702 by Butler Denmark I, RN Outcome: Progressing   Problem: Health Behavior/Discharge Planning: Goal: Ability to manage health-related needs will improve 02/25/2020 1703 by Butler Denmark I, RN Outcome: Progressing 02/25/2020 1702 by Butler Denmark I, RN Outcome: Progressing   Problem: Activity: Goal: Risk for activity intolerance will decrease 02/25/2020 1703 by Butler Denmark I, RN Outcome: Progressing 02/25/2020 1702 by Butler Denmark I, RN Outcome: Progressing   Problem: Nutrition: Goal: Adequate nutrition will be maintained 02/25/2020 1703 by Butler Denmark I, RN Outcome: Progressing 02/25/2020 1702 by Butler Denmark I, RN Outcome: Progressing   Problem: Coping: Goal: Level of anxiety will decrease Outcome: Progressing   Problem: Elimination: Goal: Will not experience complications related to bowel motility Outcome: Progressing   Problem: Pain Managment: Goal: General experience of comfort will improve 02/25/2020 1703 by Butler Denmark I, RN Outcome: Progressing 02/25/2020 1702 by Butler Denmark I, RN Outcome: Progressing   Problem: Safety: Goal: Ability to remain free from injury will improve 02/25/2020 1703 by Butler Denmark I, RN Outcome: Progressing 02/25/2020 1702 by Butler Denmark I, RN Outcome: Progressing   Problem: Education: Goal: Knowledge of the prescribed therapeutic regimen will improve 02/25/2020 1703 by Butler Denmark I, RN Outcome: Progressing 02/25/2020 1702 by Butler Denmark I, RN Outcome: Progressing   Problem: Activity: Goal: Ability to avoid complications of mobility impairment will improve 02/25/2020 1703 by Butler Denmark I, RN Outcome:  Progressing 02/25/2020 1702 by Butler Denmark I, RN Outcome: Progressing Goal: Range of joint motion will improve Outcome: Progressing   Problem: Pain Management: Goal: Pain level will decrease with appropriate interventions 02/25/2020 1703 by Butler Denmark I, RN Outcome: Progressing 02/25/2020 1702 by Butler Denmark I, RN Outcome: Progressing

## 2020-02-25 NOTE — Anesthesia Procedure Notes (Signed)
Spinal  Patient location during procedure: OR Staffing Performed: resident/CRNA  Resident/CRNA: Amro Winebarger A, CRNA Preanesthetic Checklist Completed: patient identified, IV checked, site marked, risks and benefits discussed, surgical consent, monitors and equipment checked, pre-op evaluation and timeout performed Spinal Block Patient position: sitting Prep: DuraPrep Patient monitoring: heart rate, cardiac monitor, continuous pulse ox and blood pressure Approach: midline Location: L3-4 Injection technique: single-shot Needle Needle type: Pencan  Needle gauge: 24 G Needle length: 10 cm Assessment Sensory level: T4     

## 2020-02-25 NOTE — Anesthesia Procedure Notes (Signed)
Anesthesia Procedure Image    

## 2020-02-25 NOTE — Discharge Instructions (Signed)
Lauren Arabian, MD [facility] [address], [address] [place], [place] [address] [phone]  PARTIAL KNEE REPLACEMENT POSTOPERATIVE DIRECTIONS  Knee Rehabilitation, Guidelines Following Surgery  Results after knee surgery are often greatly improved when you follow the exercise, range of motion and muscle strengthening exercises prescribed by your doctor. Safety measures are also important to protect the knee from further injury. If any of these exercises cause you to have increased pain or swelling in your knee joint, decrease the amount until you are comfortable again and slowly increase them. If you have problems or questions, call your caregiver or physical therapist for advice.   BLOOD CLOT PREVENTION . Take a 325 mg Aspirin two times a day for three weeks following surgery. Then take an 81 mg Aspirin once a day for three weeks. Then discontinue Aspirin. Lauren [name] may resume your vitamins/supplements upon discharge from the hospital. . Do not take any NSAIDs (Advil, Aleve, Ibuprofen, Meloxicam, etc.) until you have discontinued the 325 mg Aspirin.  HOME CARE INSTRUCTIONS  . Remove items at home which could result in a fall. This includes throw rugs or furniture in walking pathways.  . ICE to the affected knee as much as tolerated. Icing helps control swelling. If the swelling is well controlled you will be more comfortable and rehab easier. Continue to use ice on the knee for pain and swelling from surgery. You may notice swelling that will progress down to the foot and ankle. This is normal after surgery. Elevate the leg when you are not up walking on it.    . Continue to use the breathing machine which will help keep your temperature down. It is common for your temperature to cycle up and down following surgery, especially at night when you are not up moving around and exerting yourself. The breathing machine keeps your lungs expanded and your  temperature down. . Do not place pillow under the operative knee, focus on keeping the knee straight while resting  DIET You may resume your previous home diet once you are discharged from the hospital.  DRESSING / [place] / SHOWERING . Keep your bulky bandage on for 2 days. On the third post-operative day you may remove the Ace bandage and gauze. There is a waterproof adhesive bandage on your skin which will stay in place until your first follow-up appointment. Once you remove this you will not need to place another bandage . You may begin showering 3 days following surgery, but do not submerge the incision under water.  ACTIVITY For the first 5 days, the key is rest and control of pain and swelling . Do your home exercises twice a day starting on post-operative day 3. On the days you go to physical therapy, just do the home exercises once that day. . You should rest, ice and elevate the leg for 50 minutes out of every hour. Get up and walk/stretch for 10 minutes per hour. After 5 days you can increase your activity slowly as tolerated. . Walk with your walker as instructed. Use the walker until you are comfortable transitioning to a cane. Walk with the cane in the opposite hand of the operative leg. You may discontinue the cane once you are comfortable and walking steadily. . Avoid periods of inactivity such as sitting longer than an hour when not asleep. This helps prevent blood clots.  . You may discontinue the knee immobilizer once you are able to perform a straight leg raise  while lying down. . You may resume a sexual relationship in one month or when given the OK by your doctor.  . You may return to work once you are cleared by your doctor.  . Do not drive a car for 6 weeks or until released by your surgeon.  . Do not drive while taking narcotics.  TED HOSE STOCKINGS Wear the elastic stockings on both legs for three weeks following surgery during the day. You may remove them at night  for sleeping.  WEIGHT BEARING Weight bearing as tolerated with assist device (walker, cane, etc) as directed, use it as long as suggested by your surgeon or therapist, typically at least 4-6 weeks.  POSTOPERATIVE CONSTIPATION PROTOCOL Constipation - defined medically as fewer than three stools per week and severe constipation as less than one stool per week.  One of the most common issues patients have following surgery is constipation.  Even if you have a regular bowel pattern at home, your normal regimen is likely to be disrupted due to multiple reasons following surgery.  Combination of anesthesia, postoperative narcotics, change in appetite and fluid intake all can affect your bowels.  In order to avoid complications following surgery, here are some recommendations in order to help you during your recovery period.  . Colace (docusate) - Pick up an over-the-counter form of Colace or another stool softener and take twice a day as long as you are requiring postoperative pain medications.  Take with a full glass of water daily.  If you experience loose stools or diarrhea, hold the colace until you stool forms back up. If your symptoms do not get better within 1 week or if they get worse, check with your doctor. . Dulcolax (bisacodyl) - Pick up over-the-counter and take as directed by the product packaging as needed to assist with the movement of your bowels.  Take with a full glass of water.  Use this product as needed if not relieved by Colace only.  . MiraLax (polyethylene glycol) - Pick up over-the-counter to have on hand. MiraLax is a solution that will increase the amount of water in your bowels to assist with bowel movements.  Take as directed and can mix with a glass of water, juice, soda, coffee, or tea. Take if you go more than two days without a movement. Do not use MiraLax more than once per day. Call your doctor if you are still constipated or irregular after using this medication for 7 days  in a row.  If you continue to have problems with postoperative constipation, please contact the office for further assistance and recommendations.  If you experience "the worst abdominal pain ever" or develop nausea or vomiting, please contact the office immediatly for further recommendations for treatment.  ITCHING If you experience itching with your medications, try taking only a single pain pill, or even half a pain pill at a time.  You can also use Benadryl over the counter for itching or also to help with sleep.   MEDICATIONS See your medication summary on the "After Visit Summary" that the nursing staff will review with you prior to discharge.  You may have some home medications which will be placed on hold until you complete the course of blood thinner medication.  It is important for you to complete the blood thinner medication as prescribed by your surgeon.  Continue your approved medications as instructed at time of discharge.  PRECAUTIONS . If you experience chest pain or shortness of breath -  call 911 immediately for transfer to the hospital emergency department.  . If you develop a fever greater that 101 F, purulent drainage from wound, increased redness or drainage from wound, foul odor from the wound/dressing, or calf pain - CONTACT YOUR SURGEON.                                                   FOLLOW-UP APPOINTMENTS Make sure you keep all of your appointments after your operation with your surgeon and caregivers. You should call the office at the above phone number and make an appointment for approximately two weeks after the date of your surgery or on the date instructed by your surgeon outlined in the "After Visit Summary".  RANGE OF MOTION AND STRENGTHENING EXERCISES  Rehabilitation of the knee is important following a knee injury or an operation. After just a few days of immobilization, the muscles of the thigh which control the knee become weakened and shrink (atrophy). Knee  exercises are designed to build up the tone and strength of the thigh muscles and to improve knee motion. Often times heat used for twenty to thirty minutes before working out will loosen up your tissues and help with improving the range of motion but do not use heat for the first two weeks following surgery. These exercises can be done on a training (exercise) mat, on the floor, on a table or on a bed. Use what ever works the best and is most comfortable for you Knee exercises include:  . Leg Lifts - While your knee is still immobilized in a splint or cast, you can do straight leg raises. Lift the leg to 60 degrees, hold for 3 sec, and slowly lower the leg. Repeat 10-20 times 2-3 times daily. Perform this exercise against resistance later as your knee gets better.  . Quad and Hamstring Sets - Tighten up the muscle on the front of the thigh (Quad) and hold for 5-10 sec. Repeat this 10-20 times hourly. Hamstring sets are done by pushing the foot backward against an object and holding for 5-10 sec. Repeat as with quad sets.   Leg Slides: Lying on your back, slowly slide your foot toward your buttocks, bending your knee up off the floor (only go as far as is comfortable). Then slowly slide your foot back down until your leg is flat on the floor again.  Angel Wings: Lying on your back spread your legs to the side as far apart as you can without causing discomfort.  A rehabilitation program following serious knee injuries can speed recovery and prevent re-injury in the future due to weakened muscles. Contact your doctor or a physical therapist for more information on knee rehabilitation.   IF YOU ARE TRANSFERRED TO A SKILLED REHAB FACILITY If the patient is transferred to a skilled rehab facility following release from the hospital, a list of the current medications will be sent to the facility for the patient to continue.  When discharged from the skilled rehab facility, please have the facility set up the  patient's Home Health Physical Therapy prior to being released. Also, the skilled facility will be responsible for providing the patient with their medications at time of release from the facility to include their pain medication, the muscle relaxants, and their blood thinner medication. If the patient is still at the rehab facility   at time of the two week follow up appointment, the skilled rehab facility will also need to assist the patient in arranging follow up appointment in our office and any transportation needs.  MAKE SURE YOU:  . Understand these instructions.  . Get help right away if you are not doing well or get worse.   DENTAL ANTIBIOTICS:  In most cases prophylactic antibiotics for Dental procdeures after total joint surgery are not necessary.  Exceptions are as follows:  1. History of prior total joint infection  2. Severely immunocompromised (Organ Transplant, cancer chemotherapy, Rheumatoid biologic meds such as Humera)  3. Poorly controlled diabetes (A1C &gt; 8.0, blood glucose over 200)  If you have one of these conditions, contact your surgeon for an antibiotic prescription, prior to your dental procedure.    Pick up stool softner and laxative for home use following surgery while on pain medications. Do not submerge incision under water. Please use good hand washing techniques while changing dressing each day. May shower starting three days after surgery. Please use a clean towel to pat the incision dry following showers. Continue to use ice for pain and swelling after surgery. Do not use any lotions or creams on the incision until instructed by your surgeon.  

## 2020-02-25 NOTE — Transfer of Care (Signed)
Immediate Anesthesia Transfer of Care Note  Patient: Lauren [name]  Procedure(s) Performed: UNICOMPARTMENTAL KNEE (Left Knee)  Patient Location: PACU  Anesthesia Type:Spinal  Level of Consciousness: awake, alert  and oriented  Airway & Oxygen Therapy: Patient Spontanous Breathing and Patient connected to face mask  Post-op Assessment: Report given to RN and Post -op Vital signs reviewed and stable  Post vital signs: Reviewed and stable  Last Vitals:  Vitals Value Taken Time  BP 151/65 02/25/20 1616  Temp    Pulse 69 02/25/20 1622  Resp 24 02/25/20 1622  SpO2 100 % 02/25/20 1622  Vitals shown include unvalidated device data.  Last Pain:  Vitals:   02/25/20 1331  TempSrc:   PainSc: 0-No pain      Patients Stated Pain Goal: 4 (25/50/01 6429)  Complications: No complications documented.

## 2020-02-25 NOTE — Anesthesia Postprocedure Evaluation (Signed)
Anesthesia Post Note  Patient: MIJA EFFERTZ  Procedure(s) Performed: UNICOMPARTMENTAL KNEE (Left Knee)     Patient location during evaluation: PACU Anesthesia Type: Spinal Level of consciousness: oriented and awake and alert Pain management: pain level controlled Vital Signs Assessment: post-procedure vital signs reviewed and stable Respiratory status: spontaneous breathing, respiratory function stable and patient connected to nasal cannula oxygen Cardiovascular status: blood pressure returned to baseline and stable Postop Assessment: no headache, no backache and no apparent nausea or vomiting Anesthetic complications: no   No complications documented.  Last Vitals:  Vitals:   02/25/20 1715 02/25/20 1730  BP: (!) 152/63 (!) 148/64  Pulse: 64 63  Resp: 19 (!) 21  Temp: 36.4 C 36.4 C  SpO2: 100% 100%    Last Pain:  Vitals:   02/25/20 1730  TempSrc:   PainSc: 0-No pain            L Sensory Level: S1-Sole of foot, small toes (02/25/20 1730) R Sensory Level: S1-Sole of foot, small toes (02/25/20 1730)  Carys Malina S

## 2020-02-25 NOTE — Progress Notes (Signed)
Orthopedic Tech Progress Note Patient Details:  Lauren [name] September 24, 1940 [id number]  Patient ID: Lauren [name], female   DOB: 16-Jul-1940, 79 y.o.   MRN: [id number] Patient already has knee immobilizer.  [name] 02/25/2020, 6:09 PM

## 2020-02-25 NOTE — Progress Notes (Signed)
Assisted Dr. Rose with left, ultrasound guided, adductor canal block. Side rails up, monitors on throughout procedure. See vital signs in flow sheet. Tolerated Procedure well.  

## 2020-02-25 NOTE — Anesthesia Preprocedure Evaluation (Signed)
Anesthesia Evaluation  Patient identified by MRN, date of birth, ID band Patient awake    Reviewed: Allergy & Precautions, H&P , NPO status , Patient's Chart, lab work & pertinent test results  Airway Mallampati: II  TM Distance: >3 FB Neck ROM: Full    Dental no notable dental hx.    Pulmonary neg pulmonary ROS,    Pulmonary exam normal breath sounds clear to auscultation       Cardiovascular hypertension, Normal cardiovascular exam Rhythm:Regular Rate:Normal     Neuro/Psych negative neurological ROS  negative psych ROS   GI/Hepatic Neg liver ROS, GERD  ,  Endo/Other  diabetesHypothyroidism   Renal/GU negative Renal ROS  negative genitourinary   Musculoskeletal negative musculoskeletal ROS (+)   Abdominal   Peds negative pediatric ROS (+)  Hematology negative hematology ROS (+)   Anesthesia Other Findings   Reproductive/Obstetrics negative OB ROS                             Anesthesia Physical Anesthesia Plan  ASA: II  Anesthesia Plan: Spinal   Post-op Pain Management:  Regional for Post-op pain   Induction: Intravenous  PONV Risk Score and Plan: 3 and Ondansetron, Dexamethasone and Treatment may vary due to age or medical condition  Airway Management Planned: Simple Face Mask  Additional Equipment:   Intra-op Plan:   Post-operative Plan:   Informed Consent: I have reviewed the patients History and Physical, chart, labs and discussed the procedure including the risks, benefits and alternatives for the proposed anesthesia with the patient or authorized representative who has indicated his/her understanding and acceptance.     Dental advisory given  Plan Discussed with: CRNA and Surgeon  Anesthesia Plan Comments:         Anesthesia Quick Evaluation

## 2020-02-25 NOTE — Anesthesia Procedure Notes (Signed)
Anesthesia Regional Block: Adductor canal block   Pre-Anesthetic Checklist: ,, timeout performed, Correct Patient, Correct Site, Correct Laterality, Correct Procedure, Correct Position, site marked, Risks and benefits discussed,  Surgical consent,  Pre-op evaluation,  At surgeon's request and post-op pain management  Laterality: Left  Prep: chloraprep       Needles:  Injection technique: Single-shot  Needle Type: Echogenic Needle     Needle Length: 9cm      Additional Needles:   Procedures:,,,, ultrasound used (permanent image in chart),,,,  Narrative:  Start time: 02/25/2020 1:27 PM End time: 02/25/2020 1:32 PM Injection made incrementally with aspirations every 5 mL.  Performed by: Personally  Anesthesiologist: Myrtie Soman, MD  Additional Notes: Patient tolerated the procedure well without complications

## 2020-02-26 ENCOUNTER — Encounter (HOSPITAL_COMMUNITY): Payer: Self-pay | Admitting: Orthopedic Surgery

## 2020-02-26 DIAGNOSIS — E039 Hypothyroidism, unspecified: Secondary | ICD-10-CM | POA: Diagnosis not present

## 2020-02-26 DIAGNOSIS — Z79899 Other long term (current) drug therapy: Secondary | ICD-10-CM | POA: Diagnosis not present

## 2020-02-26 DIAGNOSIS — I10 Essential (primary) hypertension: Secondary | ICD-10-CM | POA: Diagnosis not present

## 2020-02-26 DIAGNOSIS — Z7984 Long term (current) use of oral hypoglycemic drugs: Secondary | ICD-10-CM | POA: Diagnosis not present

## 2020-02-26 DIAGNOSIS — M1712 Unilateral primary osteoarthritis, left knee: Secondary | ICD-10-CM | POA: Diagnosis not present

## 2020-02-26 DIAGNOSIS — E119 Type 2 diabetes mellitus without complications: Secondary | ICD-10-CM | POA: Diagnosis not present

## 2020-02-26 LAB — CBC
HCT: 32.5 % — ABNORMAL LOW (ref 36.0–46.0)
Hemoglobin: 11 g/dL — ABNORMAL LOW (ref 12.0–15.0)
MCH: 32.6 pg (ref 26.0–34.0)
MCHC: 33.8 g/dL (ref 30.0–36.0)
MCV: 96.4 fL (ref 80.0–100.0)
Platelets: 169 10*3/uL (ref 150–400)
RBC: 3.37 MIL/uL — ABNORMAL LOW (ref 3.87–5.11)
RDW: 13.2 % (ref 11.5–15.5)
WBC: 8.1 10*3/uL (ref 4.0–10.5)
nRBC: 0 % (ref 0.0–0.2)

## 2020-02-26 LAB — BASIC METABOLIC PANEL
Anion gap: 11 (ref 5–15)
BUN: 21 mg/dL (ref 8–23)
CO2: 22 mmol/L (ref 22–32)
Calcium: 8.9 mg/dL (ref 8.9–10.3)
Chloride: 104 mmol/L (ref 98–111)
Creatinine, Ser: 0.61 mg/dL (ref 0.44–1.00)
GFR, Estimated: >60 mL/min (ref >60)
Glucose, Bld: 165 mg/dL — ABNORMAL HIGH (ref 70–99)
Potassium: 4.1 mmol/L (ref 3.5–5.1)
Sodium: 137 mmol/L (ref 135–145)

## 2020-02-26 LAB — GLUCOSE, CAPILLARY: Glucose-Capillary: 114 mg/dL — ABNORMAL HIGH (ref 70–99)

## 2020-02-26 MED ORDER — ASPIRIN 325 MG PO TBEC: 325.0000 mg | DELAYED_RELEASE_TABLET | Freq: Two times a day (BID) | ORAL | 0 refills | Status: AC

## 2020-02-26 MED ORDER — METHOCARBAMOL 500 MG PO TABS: 500.0000 mg | ORAL_TABLET | Freq: Four times a day (QID) | ORAL | 0 refills | Status: DC | PRN

## 2020-02-26 MED ORDER — OXYCODONE HCL 5 MG PO TABS: 5.0000 mg | ORAL_TABLET | Freq: Four times a day (QID) | ORAL | 0 refills | Status: DC | PRN

## 2020-02-26 NOTE — Progress Notes (Signed)
   Subjective: 1 Day Post-Op Procedure(s) (LRB): UNICOMPARTMENTAL KNEE (Left) Patient reports pain as mild.   Patient seen in rounds by Dr. Wynelle Link. Patient is well, and has had no acute complaints or problems other than soreness in the left knee. Denies chest pain, SOB, or calf pain. No issues overnight. Foley catheter removed this AM. We will begin therapy today.   Objective: Vital signs in last 24 hours: Temp:  [97.5 F (36.4 C)-98.2 F (36.8 C)] 97.8 F (36.6 C) (12/14 0611) Pulse Rate:  [51-73] 60 (12/14 0611) Resp:  [12-22] 16 (12/14 0611) BP: (112-182)/(46-80) 133/57 (12/14 0611) SpO2:  [99 %-100 %] 100 % (12/14 0611) Weight:  [49 kg] 49 kg (12/13 1108)  Intake/Output from previous day:  Intake/Output Summary (Last 24 hours) at 02/26/2020 0744 Last data filed at 02/26/2020 0611 Gross per 24 hour  Intake 1927.5 ml  Output 2425 ml  Net -497.5 ml     Intake/Output this shift: No intake/output data recorded.  Labs: Recent Labs    02/26/20 0313  HGB 11.0*   Recent Labs    02/26/20 0313  WBC 8.1  RBC 3.37*  HCT 32.5*  PLT 169   Recent Labs    02/26/20 0313  NA 137  K 4.1  CL 104  CO2 22  BUN 21  CREATININE 0.61  GLUCOSE 165*  CALCIUM 8.9   No results for input(s): LABPT, INR in the last 72 hours.  Exam: General - Patient is Alert and Oriented Extremity - Neurologically intact Neurovascular intact Sensation intact distally Dorsiflexion/Plantar flexion intact Dressing - dressing C/D/I Motor Function - intact, moving foot and toes well on exam.   Past Medical History:  Diagnosis Date  . Allergy   . Aortic atherosclerosis ([place])   . Arthritis   . Diabetes mellitus    type 2   . Diverticulosis   . Dyspepsia   . Fecal incontinence   . Gastritis   . GERD (gastroesophageal reflux disease)   . 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 Hemorrhoids   . Hiatal hernia   .  Hyperlipidemia   . Hyperlipidemia   . Hypertension   . Hypothyroidism   . IBS (irritable bowel syndrome)   . Iron overload 07/13/2016  . 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 Osteoporosis   . UTI (lower urinary tract infection)   . Vitamin D deficiency     Assessment/Plan: 1 Day Post-Op Procedure(s) (LRB): UNICOMPARTMENTAL KNEE (Left) Principal Problem:   OA (osteoarthritis) of knee Active Problems:   Primary osteoarthritis of left knee  Estimated body mass index is 24.21 kg/m as calculated from the following:   Height as of this encounter: 4\' 8"  (1.422 m).   Weight as of this encounter: 49 kg. Advance diet Up with therapy D/C IV fluids  DVT Prophylaxis - Aspirin Weight bearing as tolerated. Begin therapy.  Plan is to go Home after hospital stay. Plan for discharge later today if progresses with therapy and is meeting her goals. Scheduled for outpatient physical therapy at [facility]. Follow-up in the office in 2 weeks.   The PDMP database was reviewed today prior to any opioid medications being prescribed to this patient.  Theresa Duty, PA-C Orthopedic Surgery [phone] 02/26/2020, 7:44 AM

## 2020-02-26 NOTE — Evaluation (Signed)
Physical Therapy Evaluation Patient Details Name: HANNIA MATCHETT MRN: [id number] DOB: [birth year] Today's Date: 02/26/2020   History of Present Illness  79 y.o. female admitted for L medial unicompartmental knee replacement 02/25/20. PMH includes DM, HTN, self catheterizes at home.  Clinical Impression  Pt ambulated 180' with RW, no loss of balance. STair training completed, pt demonstrates good understanding of HEP. She is ready to DC home from PT standpoint.     Follow Up Recommendations Follow surgeon's recommendation for DC plan and follow-up therapies;Outpatient PT    Equipment Recommendations  Rolling walker with 5" wheels;3in1 (PT)    Recommendations for Other Services       Precautions / Restrictions Precautions Precautions: Knee Precaution Booklet Issued: Yes (comment) Precaution Comments: reviewed no pillow under knee Restrictions Weight Bearing Restrictions: No      Mobility  Bed Mobility Overal bed mobility: Modified Independent             General bed mobility comments: HOB up    Transfers Overall transfer level: Needs assistance Equipment used: Rolling walker (2 wheeled) Transfers: Sit to/from Stand Sit to Stand: Supervision         General transfer comment: VCs hand placement  Ambulation/Gait Ambulation/Gait assistance: Supervision Gait Distance (Feet): 180 Feet Assistive device: Rolling walker (2 wheeled) Gait Pattern/deviations: Step-to pattern;Decreased stride length;Trunk flexed     General Gait Details: VCs sequencing and posture, steady, no LOB  Stairs Stairs: Yes Stairs assistance: Min guard Stair Management: One rail Left;Step to pattern;Forwards;With cane Number of Stairs: 5 General stair comments: VCs sequencing, min/guard safety  Wheelchair Mobility    Modified Rankin (Stroke Patients Only)       Balance Overall balance assessment: Modified Independent                                            Pertinent Vitals/Pain Pain Assessment: Faces Faces Pain Scale: Hurts even more Pain Location: L knee with flexion Pain Descriptors / Indicators: Grimacing;Guarding Pain Intervention(s): Limited activity within patient's tolerance;Monitored during session;Premedicated before session;Ice applied    Home Living Family/patient expects to be discharged to:: Private residence Living Arrangements: Alone     Home Access: Stairs to enter Entrance Stairs-Rails: Psychiatric nurse of Steps: 4 Home Layout: One level Home Equipment: Cane - single point Additional Comments: daughter to stay with pt    Prior Function Level of Independence: Independent with assistive device(s)               Hand Dominance        Extremity/Trunk Assessment   Upper Extremity Assessment Upper Extremity Assessment: Overall WFL for tasks assessed    Lower Extremity Assessment Lower Extremity Assessment: LLE deficits/detail LLE Deficits / Details: SLR +2/5, knee ext -3/5, L knee AAROM ~0-60* LLE Sensation: WNL LLE Coordination: WNL    Cervical / Trunk Assessment Cervical / Trunk Assessment: Normal  Communication   Communication: Prefers language other than English (daughter interpreted)  Cognition Arousal/Alertness: Awake/alert Behavior During Therapy: WFL for tasks assessed/performed Overall Cognitive Status: Within Functional Limits for tasks assessed                                        General Comments      Exercises Total Joint Exercises Ankle Circles/Pumps: AROM;10 reps;Both;Supine Quad  Sets: AROM;Left;5 reps;Supine Short Arc Quad: AROM;AAROM;Left;10 reps;Supine Heel Slides: AAROM;Left;10 reps;Supine Hip ABduction/ADduction: AAROM;Left;10 reps;Supine Straight Leg Raises: AAROM;Left;Supine;10 reps Long Arc Quad: AROM;Left;10 reps;Seated Knee Flexion: AROM;AAROM;Left;10 reps;Seated Goniometric ROM: 0-60* AAROM L knee   Assessment/Plan    PT  Assessment Patent does not need any further PT services  PT Problem List         PT Treatment Interventions      PT Goals (Current goals can be found in the Care Plan section)  Acute Rehab PT Goals Patient Stated Goal: to be able to walk in community PT Goal Formulation: All assessment and education complete, DC therapy    Frequency     Barriers to discharge        Co-evaluation               AM-PAC PT "6 Clicks" Mobility  Outcome Measure Help needed turning from your back to your side while in a flat bed without using bedrails?: None Help needed moving from lying on your back to sitting on the side of a flat bed without using bedrails?: A Little Help needed moving to and from a bed to a chair (including a wheelchair)?: A Little Help needed standing up from a chair using your arms (e.g., wheelchair or bedside chair)?: A Little Help needed to walk in hospital room?: A Little Help needed climbing 3-5 steps with a railing? : A Little 6 Click Score: 19    End of Session Equipment Utilized During Treatment: Gait belt Activity Tolerance: Patient tolerated treatment well Patient left: in chair;with call bell/phone within reach;with family/visitor present Nurse Communication: Mobility status      Time: 9622-2979 PT Time Calculation (min) (ACUTE ONLY): 37 min   Charges:   PT Evaluation $PT Eval Low Complexity: 1 Low PT Treatments $Gait Training: 8-22 mins        Blondell Reveal Kistler PT 02/26/2020  Acute Rehabilitation Services Pager [phone] Office [phone]

## 2020-02-26 NOTE — TOC Transition Note (Signed)
Transition of Care [facility]) - CM/SW Discharge Note   Patient Details  Name: Lauren [name] MRN: [id number] Date of Birth: 04/20/1940  [facility]) CM/SW Contact:  Lia Hopping, LCSW Phone Number: 02/26/2020, 10:14 AM   Clinical Narrative:    OrthoBundle Plan of Care, see Ortho. CM note.  RW and 3in 1 delivered to the patient bedside by [employer]   Final next level of care: OP Rehab Barriers to Discharge: Barriers Resolved   Patient Goals and CMS Choice        Discharge Placement                       Discharge Plan and Services                DME Arranged: 3-N-1,Walker rolling DME Agency: [employer] Date DME Agency Contacted: 02/26/20 Time DME Agency Contacted: 0301 Representative spoke with at DME Agency: [place] [facility] Interventions     Readmission Risk Interventions No flowsheet data found.

## 2020-02-26 NOTE — Plan of Care (Signed)
Plan of care discussed with the patient's daughter.

## 2020-02-28 DIAGNOSIS — M25562 Pain in left knee: Secondary | ICD-10-CM | POA: Diagnosis not present

## 2020-03-03 DIAGNOSIS — M25562 Pain in left knee: Secondary | ICD-10-CM | POA: Diagnosis not present

## 2020-03-05 DIAGNOSIS — M25562 Pain in left knee: Secondary | ICD-10-CM | POA: Diagnosis not present

## 2020-03-05 NOTE — Discharge Summary (Signed)
Physician Discharge Summary   Patient ID: Lauren [name] MRN: [id number] DOB/AGE: Nov 12, 1940 79 y.o.  Admit date: 02/25/2020 Discharge date: 02/26/2020  Primary Diagnosis: Medial compartment osteoarthritis, left knee   Admission Diagnoses:  Past Medical History:  Diagnosis Date  . Allergy   . Aortic atherosclerosis (HCC)   . Arthritis   . Diabetes mellitus    type 2   . Diverticulosis   . Dyspepsia   . Fecal incontinence   . Gastritis   . GERD (gastroesophageal reflux disease)   . 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 Hemorrhoids   . Hiatal hernia   . Hyperlipidemia   . Hyperlipidemia   . Hypertension   . Hypothyroidism   . IBS (irritable bowel syndrome)   . Iron overload 07/13/2016  . 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 Osteoporosis   . UTI (lower urinary tract infection)   . Vitamin D deficiency    Discharge Diagnoses:   Principal Problem:   OA (osteoarthritis) of knee Active Problems:   Primary osteoarthritis of left knee  Estimated body mass index is 24.21 kg/m as calculated from the following:   Height as of this encounter: 4\' 8"  (1.422 m).   Weight as of this encounter: 49 kg.  Procedure:  Procedure(s) (LRB): UNICOMPARTMENTAL KNEE (Left)   Consults: None  HPI: Lauren [name] is a 78 y.o. female , who has  significant isolated medial compartment arthritis of the Left knee. The patient has had nonoperative management including injections ofcortisone and viscous supplements. Unfortunately, the pain persists. Radiograph showed isolated medial compartment bone-on-bone arthritis with normal-appearing patellofemoral and lateral compartments. The patient presents now for left knee unicompartmental arthroplasty.   Laboratory Data: Admission on 02/25/2020, Discharged on 02/26/2020  Component Date Value Ref Range Status  . ABO/RH(D) 02/25/2020    Final                   Value:B POS Performed at [facility], [address] [place], [place]   . Glucose-Capillary 02/25/2020 106* 70 - 99 mg/dL Final   Glucose reference range applies only to samples taken after fasting for at least 8 hours.  . Comment 1 02/25/2020 Notify RN   Final  . Comment 2 02/25/2020 Document in Chart   Final  . Glucose-Capillary 02/25/2020 138* 70 - 99 mg/dL Final   Glucose reference range applies only to samples taken after fasting for at least 8 hours.  . WBC 02/26/2020 8.1  4.0 - 10.5 K/uL Final  . RBC 02/26/2020 3.37* 3.87 - 5.11 MIL/uL Final  . Hemoglobin 02/26/2020 11.0* 12.0 - 15.0 g/dL Final  . HCT 02/28/2020 32.5* 36.0 - 46.0 % Final  . MCV 02/26/2020 96.4  80.0 - 100.0 fL Final  . MCH 02/26/2020 32.6  26.0 - 34.0 pg Final  . MCHC 02/26/2020 33.8  30.0 - 36.0 g/dL Final  . RDW 02/28/2020 13.2  11.5 - 15.5 % Final  . Platelets 02/26/2020 169  150 - 400 K/uL Final  . nRBC 02/26/2020 0.0  0.0 - 0.2 % Final   Performed at [facility], [address], [place], [place]  . Sodium 02/26/2020 137  135 - 145 mmol/L Final  . Potassium 02/26/2020 4.1  3.5 - 5.1 mmol/L Final  . Chloride 02/26/2020 104  98 - 111 mmol/L Final  . CO2 02/26/2020 22  22 - 32 mmol/L Final  .  Glucose, Bld 02/26/2020 165* 70 - 99 mg/dL Final   Glucose reference range applies only to samples taken after fasting for at least 8 hours.  . BUN 02/26/2020 21  8 - 23 mg/dL Final  . Creatinine, Ser 02/26/2020 0.61  0.44 - 1.00 mg/dL Final  . Calcium 34/74/2595 8.9  8.9 - 10.3 mg/dL Final  . GFR, Estimated 02/26/2020 >60  >60 mL/min Final   Comment: (NOTE) Calculated using the CKD-EPI Creatinine Equation (2021)   . Anion gap 02/26/2020 11  5 - 15 Final   Performed at [facility], [address], [place], [place] [address]  . Glucose-Capillary 02/25/2020 339* 70 - 99 mg/dL Final   Glucose reference range applies only to samples taken after fasting for at least 8 hours.  .  Glucose-Capillary 02/26/2020 114* 70 - 99 mg/dL Final   Glucose reference range applies only to samples taken after fasting for at least 8 hours.  Hospital Outpatient Visit on 02/21/2020  Component Date Value Ref Range Status  . SARS Coronavirus 2 02/21/2020 NEGATIVE  NEGATIVE Final   Comment: (NOTE) SARS-CoV-2 target nucleic acids are NOT DETECTED.  The SARS-CoV-2 RNA is generally detectable in upper and lower respiratory specimens during the acute phase of infection. Negative results do not preclude SARS-CoV-2 infection, do not rule out co-infections with other pathogens, and should not be used as the sole basis for treatment or other patient management decisions. Negative results must be combined with clinical observations, patient history, and epidemiological information. The expected result is Negative.  Fact Sheet for Patients: HairSlick.no  Fact Sheet for Healthcare Providers: quierodirigir.com  This test is not yet approved or cleared by the [place] FDA and  has been authorized for detection and/or diagnosis of SARS-CoV-2 by FDA under an Emergency Use Authorization (EUA). This EUA will remain  in effect (meaning this test can be used) for the duration of the COVID-19 declaration under Se                          ction 564(b)(1) of the Act, 21 U.S.C. section 360bbb-3(b)(1), unless the authorization is terminated or revoked sooner.  Performed at [facility], [address] [place], [place] [address]   Hospital Outpatient Visit on 02/15/2020  Component Date Value Ref Range Status  . aPTT 02/15/2020 28  24 - 36 seconds Final   Performed at [facility], [address], [place], [place] [address]  . WBC 02/15/2020 6.3  4.0 - 10.5 K/uL Final  . RBC 02/15/2020 3.50* 3.87 - 5.11 MIL/uL Final  . Hemoglobin 02/15/2020 11.2* 12.0 - 15.0 g/dL Final  . HCT 41/66/0630 33.8* 36.0 - 46.0 % Final   . MCV 02/15/2020 96.6  80.0 - 100.0 fL Final  . MCH 02/15/2020 32.0  26.0 - 34.0 pg Final  . MCHC 02/15/2020 33.1  30.0 - 36.0 g/dL Final  . RDW 16/03/930 13.2  11.5 - 15.5 % Final  . Platelets 02/15/2020 188  150 - 400 K/uL Final  . nRBC 02/15/2020 0.0  0.0 - 0.2 % Final   Performed at [facility], [address], [place], [place] [address]  . Sodium 02/15/2020 137  135 - 145 mmol/L Final  . Potassium 02/15/2020 4.0  3.5 - 5.1 mmol/L Final  . Chloride 02/15/2020 102  98 - 111 mmol/L Final  . CO2 02/15/2020 25  22 - 32 mmol/L Final  . Glucose, Bld  02/15/2020 116* 70 - 99 mg/dL Final   Glucose reference range applies only to samples taken after fasting for at least 8 hours.  . BUN 02/15/2020 22  8 - 23 mg/dL Final  . Creatinine, Ser 02/15/2020 0.54  0.44 - 1.00 mg/dL Final  . Calcium 02/15/2020 9.0  8.9 - 10.3 mg/dL Final  . Total Protein 02/15/2020 6.2* 6.5 - 8.1 g/dL Final  . Albumin 02/15/2020 4.0  3.5 - 5.0 g/dL Final  . AST 02/15/2020 19  15 - 41 U/L Final  . ALT 02/15/2020 15  0 - 44 U/L Final  . Alkaline Phosphatase 02/15/2020 43  38 - 126 U/L Final  . Total Bilirubin 02/15/2020 0.9  0.3 - 1.2 mg/dL Final  . GFR, Estimated 02/15/2020 >60  >60 mL/min Final   Comment: (NOTE) Calculated using the CKD-EPI Creatinine Equation (2021)   . Anion gap 02/15/2020 10  5 - 15 Final   Performed at [facility], [place] [address], [place], [place] [address]  . Prothrombin Time 02/15/2020 12.6  11.4 - 15.2 seconds Final  . INR 02/15/2020 1.0  0.8 - 1.2 Final   Comment: (NOTE) INR goal varies based on device and disease states. Performed at [facility], [place] [address] [place], [place] [address]   . ABO/RH(D) 02/15/2020 B POS   Final  . Antibody Screen 02/15/2020 NEG   Final  . Sample Expiration 02/15/2020 02/28/2020,2359   Final  . Extend sample reason 02/15/2020    Final                   Value:NO TRANSFUSIONS OR PREGNANCY IN  THE PAST 3 MONTHS Performed at [facility], [place] [address] [place], [place] [address]   . Hgb A1c MFr Bld 02/15/2020 6.0* 4.8 - 5.6 % Final   Comment: (NOTE) Pre diabetes:          5.7%-6.4%  Diabetes:              >6.4%  Glycemic control for   <7.0% adults with diabetes   . Mean Plasma Glucose 02/15/2020 125.5  mg/dL Final   Performed at [facility] [address], [place], [place] [address]  . MRSA, PCR 02/15/2020 NEGATIVE  NEGATIVE Final  . Staphylococcus aureus 02/15/2020 NEGATIVE  NEGATIVE Final   Comment: (NOTE) The Xpert SA Assay (FDA approved for NASAL specimens in patients 65 years of age and older), is one component of a comprehensive surveillance program. It is not intended to diagnose infection nor to guide or monitor treatment. Performed at [facility], [place] [address], [place], [place] [address]   . Glucose-Capillary 02/15/2020 113* 70 - 99 mg/dL Final   Glucose reference range applies only to samples taken after fasting for at least 8 hours.     X-Rays:No results found.  EKG: Orders placed or performed during the hospital encounter of 02/15/20  . EKG 12 lead per protocol  . EKG 12 lead per protocol     Hospital Course: Lauren [name] is a 79 y.o. who was admitted to [facility]. They were brought to the operating room on 02/25/2020 and underwent Procedure(s): UNICOMPARTMENTAL KNEE.  Patient tolerated the procedure well and was later transferred to the recovery room and then to the orthopaedic floor for postoperative care. They were given PO and IV analgesics for pain control following their surgery. They were given 24 hours of postoperative antibiotics of  Anti-infectives (From admission,  onward)   Start     Dose/Rate Route Frequency Ordered Stop   02/25/20 2030  ceFAZolin (ANCEF) IVPB 2g/100 mL premix        2 g 200 mL/hr over 30 Minutes Intravenous Every 6 hours 02/25/20 1756 02/26/20 0228   02/25/20  1100  ceFAZolin (ANCEF) IVPB 2g/100 mL premix        2 g 200 mL/hr over 30 Minutes Intravenous On call to O.R. 02/25/20 1054 02/25/20 1424     and started on DVT prophylaxis in the form of Aspirin.   PT and OT were ordered for total joint protocol. Discharge planning consulted to help with postop disposition and equipment needs.  Patient had a good night on the evening of surgery. They started to get up OOB with therapy on POD #1. Pt was seen during rounds and was ready to go home pending progress with therapy. She worked with therapy on POD #1 and was meeting her goals. Pt was discharged to home later that day in stable condition.  Diet: Diabetic diet Activity: WBAT Follow-up: in 2 weeks Disposition: Home with outpatient physical therapy Discharged Condition: stable   Discharge Instructions    Call MD / Call 911   Complete by: As directed    If you experience chest pain or shortness of breath, CALL 911 and be transported to the hospital emergency room.  If you develope a fever above 101 F, pus (white drainage) or increased drainage or redness at the wound, or calf pain, call your surgeon's office.   Change dressing   Complete by: As directed    You may remove the bulky bandage (ACE wrap and gauze) two days after surgery. You will have an adhesive waterproof bandage underneath. Leave this in place until your first follow-up appointment.   Constipation Prevention   Complete by: As directed    Drink plenty of fluids.  Prune juice may be helpful.  You may use a stool softener, such as Colace (over the counter) 100 mg twice a day.  Use MiraLax (over the counter) for constipation as needed.   Diet - low sodium heart healthy   Complete by: As directed    Do not put a pillow under the knee. Place it under the heel.   Complete by: As directed    Driving restrictions   Complete by: As directed    No driving for two weeks   TED hose   Complete by: As directed    Use stockings (TED hose) for  three weeks on both leg(s).  You may remove them at night for sleeping.   Weight bearing as tolerated   Complete by: As directed      Allergies as of 02/26/2020      Reactions   Pyridium [phenazopyridine Hcl] Other (See Comments)   Hemolytic anemia   Sulfa Antibiotics    Potential allergy: oxidizing drug: methemoglobinemia/hemolysis on pyridium   Ciprofloxacin Hcl    Sx's almost like a heart attack   Esomeprazole Magnesium Nausea Only   Pain and nausea   Macrodantin [nitrofurantoin Macrocrystal] Other (See Comments)   GI Upset   Pantoprazole Sodium Nausea Only   abd pain and nausea   Tramadol Nausea Only      Medication List    STOP taking these medications   diclofenac Sodium 1 % Gel Commonly known as: VOLTAREN     TAKE these medications   ascorbic acid 500 MG tablet Commonly known as: VITAMIN C Take 500 mg by  mouth daily.   aspirin 325 MG EC tablet Take 1 tablet (325 mg total) by mouth 2 (two) times daily for 20 days. Then take one 81 mg aspirin once a day for three weeks. Then discontinue aspirin.   Fish Oil 500 MG Caps Take 500 mg by mouth 2 (two) times daily.   glucosamine-chondroitin 500-400 MG tablet Take 1 tablet by mouth 3 (three) times daily.   levothyroxine 75 MCG tablet Commonly known as: SYNTHROID Take 75 mcg by mouth daily.   losartan 100 MG tablet Commonly known as: COZAAR Take 100 mg by mouth daily.   methocarbamol 500 MG tablet Commonly known as: ROBAXIN Take 1 tablet (500 mg total) by mouth every 6 (six) hours as needed for muscle spasms.   multivitamin tablet Take 1 tablet by mouth daily.   omeprazole 40 MG capsule Commonly known as: PRILOSEC Take 1 capsule (40 mg total) by mouth daily.   oxyCODONE 5 MG immediate release tablet Commonly known as: Oxy IR/ROXICODONE Take 1-2 tablets (5-10 mg total) by mouth every 6 (six) hours as needed for severe pain.   pravastatin 40 MG tablet Commonly known as: PRAVACHOL Take 40 mg by mouth  daily.   sitaGLIPtin-metformin 50-500 MG tablet Commonly known as: JANUMET Take 1 tablet by mouth 2 (two) times daily with a meal.   vitamin B-12 100 MCG tablet Commonly known as: CYANOCOBALAMIN Take 100 mcg by mouth daily.   Vitamin D3 50 MCG (2000 UT) Tabs Take 1 tablet by mouth daily.            Discharge Care Instructions  (From admission, onward)         Start     Ordered   02/26/20 0000  Weight bearing as tolerated        02/26/20 0749   02/26/20 0000  Change dressing       Comments: You may remove the bulky bandage (ACE wrap and gauze) two days after surgery. You will have an adhesive waterproof bandage underneath. Leave this in place until your first follow-up appointment.   02/26/20 0749          Follow-up Information    Gaynelle Arabian, MD. Go on 03/11/2020.   Specialty: Orthopedic Surgery Why: You are scheduled for first post op appointment on Tuesday December 28th at 3:15pm with Molli Barrows, [place].  Contact information: [address] [place] [address] [phone]        Rosilyn Mings.. Go on 02/28/2020.   Why: You are scheduled for physical therapy eval on Thursday December 16th at 1:30pm.  Contact information: [place] [id number] [phone]               Signed: Theresa Duty, PA-C Orthopedic Surgery 03/05/2020, 2:04 PM

## 2020-03-10 DIAGNOSIS — R339 Retention of urine, unspecified: Secondary | ICD-10-CM | POA: Diagnosis not present

## 2020-03-12 DIAGNOSIS — M25562 Pain in left knee: Secondary | ICD-10-CM | POA: Diagnosis not present

## 2020-03-20 DIAGNOSIS — M25562 Pain in left knee: Secondary | ICD-10-CM | POA: Diagnosis not present

## 2020-03-28 DIAGNOSIS — M25562 Pain in left knee: Secondary | ICD-10-CM | POA: Diagnosis not present

## 2020-04-02 DIAGNOSIS — Z96652 Presence of left artificial knee joint: Secondary | ICD-10-CM | POA: Diagnosis not present

## 2020-04-03 DIAGNOSIS — M25562 Pain in left knee: Secondary | ICD-10-CM | POA: Diagnosis not present

## 2020-04-05 ENCOUNTER — Telehealth: Payer: Self-pay | Admitting: *Deleted

## 2020-04-05 NOTE — Telephone Encounter (Signed)
Pt sent me text message around 4am, returned call pt back at 7am after checked her I&O cath orders. Will ship 04/09/20, and made sure if she has enough supplies. She said about 7 supplies left. She f/u with MD for knee surgery that progressing well. She is cont. To rehab til march. Her daughter saying with her til then. Otherwise no c/o voiced.

## 2020-04-07 DIAGNOSIS — M25562 Pain in left knee: Secondary | ICD-10-CM | POA: Diagnosis not present

## 2020-04-09 DIAGNOSIS — R339 Retention of urine, unspecified: Secondary | ICD-10-CM | POA: Diagnosis not present

## 2020-04-10 DIAGNOSIS — M25562 Pain in left knee: Secondary | ICD-10-CM | POA: Diagnosis not present

## 2020-04-11 ENCOUNTER — Telehealth: Payer: Self-pay | Admitting: *Deleted

## 2020-04-11 NOTE — Telephone Encounter (Signed)
Called to confirm if she received I&O cath, she said only I&O cath received, e-mail sent to supply co. Will check up later.

## 2020-04-16 DIAGNOSIS — M25562 Pain in left knee: Secondary | ICD-10-CM | POA: Diagnosis not present

## 2020-04-18 DIAGNOSIS — M25562 Pain in left knee: Secondary | ICD-10-CM | POA: Diagnosis not present

## 2020-04-24 DIAGNOSIS — M25562 Pain in left knee: Secondary | ICD-10-CM | POA: Diagnosis not present

## 2020-04-29 DIAGNOSIS — M25562 Pain in left knee: Secondary | ICD-10-CM | POA: Diagnosis not present

## 2020-05-06 DIAGNOSIS — E785 Hyperlipidemia, unspecified: Secondary | ICD-10-CM | POA: Diagnosis not present

## 2020-05-06 DIAGNOSIS — I10 Essential (primary) hypertension: Secondary | ICD-10-CM | POA: Diagnosis not present

## 2020-05-06 DIAGNOSIS — E119 Type 2 diabetes mellitus without complications: Secondary | ICD-10-CM | POA: Diagnosis not present

## 2020-05-06 DIAGNOSIS — E039 Hypothyroidism, unspecified: Secondary | ICD-10-CM | POA: Diagnosis not present

## 2020-05-06 DIAGNOSIS — M170 Bilateral primary osteoarthritis of knee: Secondary | ICD-10-CM | POA: Diagnosis not present

## 2020-05-09 DIAGNOSIS — R339 Retention of urine, unspecified: Secondary | ICD-10-CM | POA: Diagnosis not present

## 2020-05-13 DIAGNOSIS — E1122 Type 2 diabetes mellitus with diabetic chronic kidney disease: Secondary | ICD-10-CM | POA: Diagnosis not present

## 2020-05-13 DIAGNOSIS — Z0000 Encounter for general adult medical examination without abnormal findings: Secondary | ICD-10-CM | POA: Diagnosis not present

## 2020-05-13 DIAGNOSIS — R413 Other amnesia: Secondary | ICD-10-CM | POA: Diagnosis not present

## 2020-05-13 DIAGNOSIS — I10 Essential (primary) hypertension: Secondary | ICD-10-CM | POA: Diagnosis not present

## 2020-05-13 DIAGNOSIS — D509 Iron deficiency anemia, unspecified: Secondary | ICD-10-CM | POA: Diagnosis not present

## 2020-05-13 DIAGNOSIS — E785 Hyperlipidemia, unspecified: Secondary | ICD-10-CM | POA: Diagnosis not present

## 2020-05-13 DIAGNOSIS — N182 Chronic kidney disease, stage 2 (mild): Secondary | ICD-10-CM | POA: Diagnosis not present

## 2020-05-13 DIAGNOSIS — E039 Hypothyroidism, unspecified: Secondary | ICD-10-CM | POA: Diagnosis not present

## 2020-05-13 DIAGNOSIS — K219 Gastro-esophageal reflux disease without esophagitis: Secondary | ICD-10-CM | POA: Diagnosis not present

## 2020-05-13 DIAGNOSIS — R339 Retention of urine, unspecified: Secondary | ICD-10-CM | POA: Diagnosis not present

## 2020-06-10 DIAGNOSIS — R339 Retention of urine, unspecified: Secondary | ICD-10-CM | POA: Diagnosis not present

## 2020-07-10 DIAGNOSIS — R339 Retention of urine, unspecified: Secondary | ICD-10-CM | POA: Diagnosis not present

## 2020-07-18 ENCOUNTER — Encounter: Payer: Self-pay | Admitting: *Deleted

## 2020-07-18 ENCOUNTER — Telehealth: Payer: Self-pay | Admitting: *Deleted

## 2020-07-18 NOTE — Congregational Nurse Program (Signed)
  Dept: [facility] Nurse Program Note  Date of Encoun t: 07/17/2020 Past Medical History: Past Medical History:  Diagnosis Date  . Allergy   . Aortic atherosclerosis ([place])   . Arthritis   . Diabetes mellitus    type 2   . Diverticulosis   . Dyspepsia   . Fecal incontinence   . Gastritis   . GERD (gastroesophageal reflux disease)   . 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 Hemorrhoids   . Hiatal hernia   . Hyperlipidemia   . Hyperlipidemia   . Hypertension   . Hypothyroidism   . IBS (irritable bowel syndrome)   . Iron overload 07/13/2016  . 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 Osteoporosis   . UTI (lower urinary tract infection)   . Vitamin D deficiency     Encounter Details: Her medical equipment( I&O cath) was delivered 07/12/20 only  20ea cath instesd of 120 ea. Run out cath, called [employer] that said they send rest of then, would arrive 5/4 afternoon, did not came. Left messages, called to [employer], on 5/5 in am, called Dr. Keane Sc[facility] to get 2 days supplies and it's been a year since she saw Dr. Claudia Desanctis, made appointment on 07/28/20  9am. Delivered  Cath to home, and [employer] sent message that will arrive rest of supplies on 5/5/late afternoon, which did not came. Will f/w

## 2020-07-18 NOTE — Telephone Encounter (Signed)
Confirmed pt received I&O cath. Today.

## 2020-07-19 NOTE — Congregational Nurse Program (Unsigned)
Dept: [facility] Nurse Program Note  Date of Encounter: 07/18/2020   Dept: [phone]   Congregational Nurse Program Note  Date of Encounter: 07/18/2020  Past Medical History: Past Medical History:  Diagnosis Date  . Allergy   . Aortic atherosclerosis ([place])   . Arthritis   . Diabetes mellitus    type 2   . Diverticulosis   . Dyspepsia   . Fecal incontinence   . Gastritis   . GERD (gastroesophageal reflux disease)   . 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 Hemorrhoids   . Hiatal hernia   . Hyperlipidemia   . Hyperlipidemia   . Hypertension   . Hypothyroidism   . IBS (irritable bowel syndrome)   . Iron overload 07/13/2016  . 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 Osteoporosis   . UTI (lower urinary tract infection)   . Vitamin D deficiency     Encounter Details:  CNP Questionnaire - 07/17/20 1243      Questionnaire   Do you give verbal consent to treat you today? --   NA   Visit Setting Other   home visit   Location Patient Served At Not Applicable   [facility], home visit   Patient Status Immigrant    Medical Provider Yes    Insurance Medicare    Intervention Support;Spiritual Care   anxious of run out I&O cath   Housing/Utilities No permanent housing    Transportation Need transportation assistance   at times   Interpersonal Safety --   none   Food --   None   Medication --   NA   Referrals Other   NA, made an appointment with Dr. Claudia Desanctis   Screening Referrals Annual Wellness Visit   Dr Claudia Desanctis f/p yearly visit on 5/16   ED Visit Averted --   NA   Life-Saving Intervention Made --   NA           Past Medical History: Past Medical History:  Diagnosis Date  . Allergy   . Aortic atherosclerosis ([place])   . Arthritis   . Diabetes mellitus    type 2   . Diverticulosis   . Dyspepsia   . Fecal incontinence   . Gastritis   . GERD (gastroesophageal reflux disease)   . Hemolytic anemia  due to drugs ([place]) 07/14/2016   Possible related to pyridium  G6PD studies pending  . Hemorrhoids   . Hiatal hernia   . Hyperlipidemia   . Hyperlipidemia   . Hypertension   . Hypothyroidism   . IBS (irritable bowel syndrome)   . Iron overload 07/13/2016  . 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 Osteoporosis   . UTI (lower urinary tract infection)   . Vitamin D deficiency     Encounter Details:  CNP Questionnaire - 07/17/20 1243      Questionnaire   Do you give verbal consent to treat you today? --   NA   Visit Setting Other   home visit   Location Patient Served At Not Applicable   [facility], home visit   Patient Status Immigrant    Medical Provider Yes    Insurance Medicare    Intervention Support;Spiritual Care   anxious of run out I&O cath   Housing/Utilities No permanent housing    Transportation Need transportation assistance   at times   Interpersonal Safety --   none   Food --   None  Medication --   NA   Referrals Other   NA, made an appointment with Dr. Claudia Desanctis   Screening Referrals Annual Wellness Visit   Dr Claudia Desanctis f/p yearly visit on 5/16   ED Visit Averted --   NA   Life-Saving Intervention Made --   NA

## 2020-07-28 ENCOUNTER — Encounter: Payer: Self-pay | Admitting: *Deleted

## 2020-07-28 DIAGNOSIS — R3914 Feeling of incomplete bladder emptying: Secondary | ICD-10-CM | POA: Diagnosis not present

## 2020-07-28 DIAGNOSIS — R338 Other retention of urine: Secondary | ICD-10-CM | POA: Diagnosis not present

## 2020-07-28 NOTE — Congregational Nurse Program (Signed)
  Dept: [facility] Nurse Program Note  Date of Encounter: 07/28/2020  Past Medical History: Past Medical History:  Diagnosis Date  . Allergy   . Aortic atherosclerosis ([place])   . Arthritis   . Diabetes mellitus    type 2   . Diverticulosis   . Dyspepsia   . Fecal incontinence   . Gastritis   . GERD (gastroesophageal reflux disease)   . 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 Hemorrhoids   . Hiatal hernia   . Hyperlipidemia   . Hyperlipidemia   . Hypertension   . Hypothyroidism   . IBS (irritable bowel syndrome)   . Iron overload 07/13/2016  . 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 Osteoporosis   . UTI (lower urinary tract infection)   . Vitamin D deficiency     Encounter Details:  CNP Questionnaire - 07/28/20 1000      Questionnaire   Do you give verbal consent to treat you today? Yes    Visit Setting Other   Dr's office   Location Patient Served At Not Applicable   Dr's office   Patient Status Immigrant    Medical Provider Yes    Insurance Medicare    Intervention Educate;Support   interprete   Housing/Utilities No permanent housing    Transportation Need transportation assistance    Interpersonal Safety --   NA   Food --   NA   Medication --   NA   Referrals --   NA   Screening Referrals --   NA   ED Visit Averted --   NA   Life-Saving Intervention Made --   NA         Yearly check up with Dr. Claudia Desanctis , urology. I&O cath QID, pt asked questioned that when she does not have to I&O cath,  DR. PACE told her bladder function never coming back so if she cannot go I&O cath self, need cath stay in the bladder, unless no UTI sx  Noticed, keep QID that keeps uop less than 400 cc. Will f/u 1 year later per MD.

## 2020-08-12 DIAGNOSIS — R339 Retention of urine, unspecified: Secondary | ICD-10-CM | POA: Diagnosis not present

## 2020-09-11 DIAGNOSIS — R339 Retention of urine, unspecified: Secondary | ICD-10-CM | POA: Diagnosis not present

## 2020-09-23 ENCOUNTER — Ambulatory Visit: Payer: Medicare Other | Attending: Internal Medicine

## 2020-09-23 DIAGNOSIS — Z23 Encounter for immunization: Secondary | ICD-10-CM

## 2020-09-23 NOTE — Progress Notes (Signed)
   Covid-19 Vaccination Clinic  Name:  Lauren [name]    MRN: [id number] DOB: 1940/08/23  09/23/2020  Lauren [name] was observed post Covid-19 immunization for 15 minutes without incident. She was provided with Vaccine Information Sheet and instruction to access the V-Safe system.   Lauren [name] was instructed to call 911 with any severe reactions post vaccine: Difficulty breathing  Swelling of face and throat  A fast heartbeat  A bad rash all over body  Dizziness and weakness   Immunizations Administered     Name Date Dose VIS Date Route   PFIZER Comrnaty(Gray TOP) Covid-19 Vaccine 09/23/2020  9:50 AM 0.3 mL 02/21/2020 Intramuscular   Manufacturer: [place]   Lot: [id number]   [place]: [phone]

## 2020-09-26 ENCOUNTER — Other Ambulatory Visit (HOSPITAL_BASED_OUTPATIENT_CLINIC_OR_DEPARTMENT_OTHER): Payer: Self-pay

## 2020-09-26 MED ORDER — COVID-19 MRNA VAC-TRIS(PFIZER) 30 MCG/0.3ML IM SUSP
INTRAMUSCULAR | 0 refills | Status: DC
  Filled 2020-09-26: qty 0.3, 1d supply, fill #0

## 2020-10-05 ENCOUNTER — Encounter: Payer: Self-pay | Admitting: *Deleted

## 2020-10-05 NOTE — Congregational Nurse Program (Signed)
Pt stopped by office said abd. Cramps yesterday. It happened suddenly and took some tums and get better. Will f/u .

## 2020-10-16 DIAGNOSIS — R339 Retention of urine, unspecified: Secondary | ICD-10-CM | POA: Diagnosis not present

## 2020-11-08 ENCOUNTER — Telehealth: Payer: Self-pay | Admitting: *Deleted

## 2020-11-08 NOTE — Telephone Encounter (Signed)
Called this am to check pt.  Said remains dizziness .  Called  again checked pt, felt better now. Still taking Mclizine 1 tab per day. BO 133/83, HR 84 per pt.  Will call DR's office and make appointment to evaluation dizzy and need more Rx.

## 2020-11-09 ENCOUNTER — Encounter: Payer: Self-pay | Admitting: *Deleted

## 2020-11-09 NOTE — Congregational Nurse Program (Signed)
  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 Yes    Visit Setting Church or Organization   [facility]   Location Patient Served At Not Applicable   [facility]   Patient Status Immigrant    Medical Provider Yes    Insurance Medicare    Intervention Spiritual Care;Support;Counsel    Housing/Utilities No permanent housing    Transportation --   na   Interpersonal Safety --   NA   Food --   NA   Medication --   NA   Referrals Other   check with primary Dr.   Screening Referrals --   NA   ED Visit Averted --   [place] --   NA            Visited office, checking BP for dizziness. Need check up with primary Dr.  Aletha Halim make appointment tomorrow.

## 2020-11-11 DIAGNOSIS — R42 Dizziness and giddiness: Secondary | ICD-10-CM | POA: Diagnosis not present

## 2020-11-11 DIAGNOSIS — I951 Orthostatic hypotension: Secondary | ICD-10-CM | POA: Diagnosis not present

## 2020-11-18 DIAGNOSIS — R339 Retention of urine, unspecified: Secondary | ICD-10-CM | POA: Diagnosis not present

## 2020-11-20 DIAGNOSIS — D649 Anemia, unspecified: Secondary | ICD-10-CM | POA: Diagnosis not present

## 2020-11-20 DIAGNOSIS — E039 Hypothyroidism, unspecified: Secondary | ICD-10-CM | POA: Diagnosis not present

## 2020-11-20 DIAGNOSIS — I10 Essential (primary) hypertension: Secondary | ICD-10-CM | POA: Diagnosis not present

## 2020-11-20 DIAGNOSIS — E785 Hyperlipidemia, unspecified: Secondary | ICD-10-CM | POA: Diagnosis not present

## 2020-11-20 DIAGNOSIS — R42 Dizziness and giddiness: Secondary | ICD-10-CM | POA: Diagnosis not present

## 2020-11-20 DIAGNOSIS — E1122 Type 2 diabetes mellitus with diabetic chronic kidney disease: Secondary | ICD-10-CM | POA: Diagnosis not present

## 2020-11-21 ENCOUNTER — Telehealth: Payer: Self-pay | Admitting: *Deleted

## 2020-11-21 ENCOUNTER — Encounter (HOSPITAL_COMMUNITY): Payer: Self-pay

## 2020-11-21 ENCOUNTER — Emergency Department (HOSPITAL_COMMUNITY)
Admission: EM | Admit: 2020-11-21 | Discharge: 2020-11-21 | Disposition: A | Discharge status: Home / Self Care | Payer: Medicare Other | Source: Home / Self Care | Attending: Emergency Medicine | Admitting: Emergency Medicine

## 2020-11-21 ENCOUNTER — Emergency Department (HOSPITAL_COMMUNITY): Payer: Medicare Other

## 2020-11-21 ENCOUNTER — Encounter (HOSPITAL_BASED_OUTPATIENT_CLINIC_OR_DEPARTMENT_OTHER): Payer: Self-pay | Admitting: Emergency Medicine

## 2020-11-21 ENCOUNTER — Emergency Department (HOSPITAL_BASED_OUTPATIENT_CLINIC_OR_DEPARTMENT_OTHER): Payer: Medicare Other

## 2020-11-21 ENCOUNTER — Other Ambulatory Visit: Payer: Self-pay

## 2020-11-21 ENCOUNTER — Emergency Department (HOSPITAL_BASED_OUTPATIENT_CLINIC_OR_DEPARTMENT_OTHER)
Admission: EM | Admit: 2020-11-21 | Discharge: 2020-11-21 | Disposition: A | Discharge status: Home / Self Care | Payer: Medicare Other | Attending: Emergency Medicine | Admitting: Emergency Medicine

## 2020-11-21 DIAGNOSIS — Z96652 Presence of left artificial knee joint: Secondary | ICD-10-CM | POA: Insufficient documentation

## 2020-11-21 DIAGNOSIS — B961 Klebsiella pneumoniae [K. pneumoniae] as the cause of diseases classified elsewhere: Secondary | ICD-10-CM | POA: Insufficient documentation

## 2020-11-21 DIAGNOSIS — E119 Type 2 diabetes mellitus without complications: Secondary | ICD-10-CM | POA: Insufficient documentation

## 2020-11-21 DIAGNOSIS — E039 Hypothyroidism, unspecified: Secondary | ICD-10-CM | POA: Insufficient documentation

## 2020-11-21 DIAGNOSIS — Z79899 Other long term (current) drug therapy: Secondary | ICD-10-CM | POA: Diagnosis not present

## 2020-11-21 DIAGNOSIS — R1011 Right upper quadrant pain: Secondary | ICD-10-CM | POA: Insufficient documentation

## 2020-11-21 DIAGNOSIS — Z7984 Long term (current) use of oral hypoglycemic drugs: Secondary | ICD-10-CM | POA: Diagnosis not present

## 2020-11-21 DIAGNOSIS — K838 Other specified diseases of biliary tract: Secondary | ICD-10-CM | POA: Insufficient documentation

## 2020-11-21 DIAGNOSIS — N390 Urinary tract infection, site not specified: Secondary | ICD-10-CM | POA: Insufficient documentation

## 2020-11-21 DIAGNOSIS — R109 Unspecified abdominal pain: Secondary | ICD-10-CM | POA: Diagnosis not present

## 2020-11-21 DIAGNOSIS — K828 Other specified diseases of gallbladder: Secondary | ICD-10-CM | POA: Diagnosis not present

## 2020-11-21 DIAGNOSIS — I10 Essential (primary) hypertension: Secondary | ICD-10-CM | POA: Insufficient documentation

## 2020-11-21 LAB — URINALYSIS, ROUTINE W REFLEX MICROSCOPIC
Bilirubin Urine: NEGATIVE
Glucose, UA: NEGATIVE mg/dL
Hgb urine dipstick: NEGATIVE
Ketones, ur: NEGATIVE mg/dL
Nitrite: POSITIVE — AB
Protein, ur: NEGATIVE mg/dL
Specific Gravity, Urine: 1.015 (ref 1.005–1.030)
pH: 7.5 (ref 5.0–8.0)

## 2020-11-21 LAB — CBC WITH DIFFERENTIAL/PLATELET
Abs Immature Granulocytes: 0.03 10*3/uL (ref 0.00–0.07)
Basophils Absolute: 0.1 10*3/uL (ref 0.0–0.1)
Basophils Relative: 1 %
Eosinophils Absolute: 0 10*3/uL (ref 0.0–0.5)
Eosinophils Relative: 0 %
HCT: 32.5 % — ABNORMAL LOW (ref 36.0–46.0)
Hemoglobin: 11.2 g/dL — ABNORMAL LOW (ref 12.0–15.0)
Immature Granulocytes: 0 %
Lymphocytes Relative: 16 %
Lymphs Abs: 1.4 10*3/uL (ref 0.7–4.0)
MCH: 32 pg (ref 26.0–34.0)
MCHC: 34.5 g/dL (ref 30.0–36.0)
MCV: 92.9 fL (ref 80.0–100.0)
Monocytes Absolute: 0.3 10*3/uL (ref 0.1–1.0)
Monocytes Relative: 3 %
Neutro Abs: 6.9 10*3/uL (ref 1.7–7.7)
Neutrophils Relative %: 80 %
Platelets: 173 10*3/uL (ref 150–400)
RBC: 3.5 MIL/uL — ABNORMAL LOW (ref 3.87–5.11)
RDW: 13.5 % (ref 11.5–15.5)
WBC: 8.8 10*3/uL (ref 4.0–10.5)
nRBC: 0 % (ref 0.0–0.2)

## 2020-11-21 LAB — URINALYSIS, MICROSCOPIC (REFLEX)

## 2020-11-21 LAB — COMPREHENSIVE METABOLIC PANEL
ALT: 18 U/L (ref 0–44)
AST: 20 U/L (ref 15–41)
Albumin: 3.6 g/dL (ref 3.5–5.0)
Alkaline Phosphatase: 62 U/L (ref 38–126)
Anion gap: 8 (ref 5–15)
BUN: 28 mg/dL — ABNORMAL HIGH (ref 8–23)
CO2: 23 mmol/L (ref 22–32)
Calcium: 9.3 mg/dL (ref 8.9–10.3)
Chloride: 102 mmol/L (ref 98–111)
Creatinine, Ser: 0.55 mg/dL (ref 0.44–1.00)
GFR, Estimated: >60 mL/min (ref >60)
Glucose, Bld: 157 mg/dL — ABNORMAL HIGH (ref 70–99)
Potassium: 4.3 mmol/L (ref 3.5–5.1)
Sodium: 133 mmol/L — ABNORMAL LOW (ref 135–145)
Total Bilirubin: 0.3 mg/dL (ref 0.3–1.2)
Total Protein: 6.4 g/dL — ABNORMAL LOW (ref 6.5–8.1)

## 2020-11-21 LAB — LIPASE, BLOOD: Lipase: 68 U/L — ABNORMAL HIGH (ref 11–51)

## 2020-11-21 MED ORDER — FENTANYL CITRATE PF 50 MCG/ML IJ SOSY
50.0000 ug | PREFILLED_SYRINGE | Freq: Once | INTRAMUSCULAR | Status: AC
Start: 2020-11-21 — End: 2020-11-21
  Administered 2020-11-21: 50 ug via INTRAMUSCULAR
  Filled 2020-11-21: qty 1

## 2020-11-21 MED ORDER — ONDANSETRON HCL 4 MG/2ML IJ SOLN
4.0000 mg | Freq: Once | INTRAMUSCULAR | Status: AC
  Administered 2020-11-21: 4 mg via INTRAVENOUS
  Filled 2020-11-21: qty 2

## 2020-11-21 MED ORDER — SODIUM CHLORIDE 0.9 % IV SOLN: INTRAVENOUS | Status: DC | PRN

## 2020-11-21 MED ORDER — SODIUM CHLORIDE 0.9 % IV SOLN
1.0000 g | Freq: Once | INTRAVENOUS | Status: AC
  Administered 2020-11-21: 1 g via INTRAVENOUS
  Filled 2020-11-21: qty 10

## 2020-11-21 MED ORDER — ONDANSETRON 4 MG PO TBDP: 4.0000 mg | ORAL_TABLET | Freq: Three times a day (TID) | ORAL | 0 refills | Status: DC | PRN

## 2020-11-21 MED ORDER — FENTANYL CITRATE PF 50 MCG/ML IJ SOSY
50.0000 ug | PREFILLED_SYRINGE | Freq: Once | INTRAMUSCULAR | Status: AC
Start: 2020-11-21 — End: 2020-11-21
  Administered 2020-11-21: 50 ug via INTRAVENOUS
  Filled 2020-11-21: qty 1

## 2020-11-21 MED ORDER — IOHEXOL 350 MG/ML SOLN
100.0000 mL | Freq: Once | INTRAVENOUS | Status: AC | PRN
  Administered 2020-11-21: 85 mL via INTRAVENOUS

## 2020-11-21 MED ORDER — SODIUM CHLORIDE 0.9 % IV BOLUS
500.0000 mL | Freq: Once | INTRAVENOUS | Status: AC
  Administered 2020-11-21: 500 mL via INTRAVENOUS

## 2020-11-21 MED ORDER — CEFDINIR 300 MG PO CAPS: 300.0000 mg | ORAL_CAPSULE | Freq: Two times a day (BID) | ORAL | 0 refills | Status: DC

## 2020-11-21 NOTE — Telephone Encounter (Signed)
Pt is in ER, questioned any allergy reaction for the antibiotic that Dr. Vonzella Nipple, allergy list in record, otherwise no report the antibiotic what Dr ordered. Waiting respond from church member or pt.

## 2020-11-21 NOTE — ED Triage Notes (Signed)
Pt c/o right flank pain radiating to mid-abd x 8 hours. Denies n/v/d

## 2020-11-21 NOTE — ED Triage Notes (Signed)
Pt c/o right sided flank pain. Seen at [facility] this morning, dx with UTI. Unsure of dx or meds due to language barrier, explained dx and meds to patient and family. Requesting to see provider.

## 2020-11-21 NOTE — ED Provider Notes (Signed)
[facility] DEPARTMENT Provider Note   CSN: [place]:[id number] Arrival date & time: 11/21/20  0351     History Chief Complaint  Patient presents with   Abdominal Pain    Lauren [name] is a 80 y.o. female.  The history is provided by the patient, a relative and medical records.  Abdominal Pain Lauren [name] is a 80 y.o. female who presents to the Emergency Department complaining of abdominal pain. She presents the emergency department for evaluation of right-sided abdominal pain that started around 8:30 PM. Pain is described as twisting and constant in nature. No prior similar symptoms. Pain radiates from the right flank to the central abdomen. There is associated with pain on breathing. No associated fevers, nausea, vomiting, diarrhea, constipation, dysuria. She does perform in and out catheterization at home. No change in urine quality. She also has a history of hypertension, diabetes. No prior abdominal surgeries.    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   Possible related to pyridium  G6PD studies pending   Hemorrhoids    Hiatal hernia    Hyperlipidemia    Hyperlipidemia    Hypertension    Hypothyroidism    IBS (irritable bowel syndrome)    Iron overload 07/13/2016   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   Osteoporosis    UTI (lower urinary tract infection)    Vitamin D deficiency     Patient Active Problem List   Diagnosis Date Noted   OA (osteoarthritis) of knee 02/25/2020   Primary osteoarthritis of left knee 02/25/2020   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   Iron overload 07/13/2016   Knee pain, bilateral 07/06/2016   Upset stomach 05/18/2016   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    Ear ringing 02/23/2016   Fecal incontinence 01/03/2015   Nausea without vomiting [id number]   Periumbilical abdominal pain 01/03/2015   HTN (hypertension) 01/25/2011   Hyperlipidemia 01/25/2011   Diabetes mellitus 01/25/2011   GERD (gastroesophageal reflux disease) 01/25/2011   Osteoporosis 01/25/2011   Hypothyroidism 01/25/2011    Past Surgical History:  Procedure Laterality Date   COLONOSCOPY     PARTIAL KNEE ARTHROPLASTY Left 02/25/2020   Procedure: UNICOMPARTMENTAL KNEE;  Surgeon: Gaynelle Arabian, MD;  Location: [facility];  Service: Orthopedics;  Laterality: Left;  18mn     OB History     Gravida  5   Para  3   Term      Preterm      AB  2   Living  3      SAB      IAB  2   Ectopic      Multiple      Live Births              Family History  Problem Relation Age of Onset   Colon cancer Neg Hx     Social History   Tobacco Use   Smoking status: Never   Smokeless tobacco: Never  Substance Use Topics   Alcohol use: No   Drug use: No    Home Medications Prior to Admission medications   Medication Sig Start Date  End Date Taking? Authorizing Provider  cefdinir (OMNICEF) 300 MG capsule Take 1 capsule (300 mg total) by mouth 2 (two) times daily. 11/21/20  Yes Quintella Reichert, MD  ondansetron (ZOFRAN ODT) 4 MG disintegrating tablet Take 1 tablet (4 mg total) by mouth every 8 (eight) hours as needed for nausea or vomiting. 11/21/20  Yes Quintella Reichert, MD  ascorbic acid (VITAMIN C) 500 MG tablet Take 500 mg by mouth daily.    [provider]  Cholecalciferol (VITAMIN D3) 2000 UNITS TABS Take 1 tablet by mouth daily.    [provider]  COVID-19 mRNA Vac-TriS, Pfizer, SUSP injection Inject into the muscle. 09/23/20   Carlyle Basques, MD  glucosamine-chondroitin 500-400 MG tablet Take 1 tablet by mouth 3 (three) times daily.    [provider]  levothyroxine (SYNTHROID, LEVOTHROID) 75 MCG tablet Take 75 mcg by mouth daily.     [provider]  losartan (COZAAR) 100 MG tablet Take 100 mg by mouth daily.    [provider]  methocarbamol (ROBAXIN) 500 MG tablet Take 1 tablet (500 mg total) by mouth every 6 (six) hours as needed for muscle spasms. 02/26/20   Edmisten, Ok Anis, PA  Multiple Vitamin (MULTIVITAMIN) tablet Take 1 tablet by mouth daily.    [provider]  Omega-3 Fatty Acids (FISH OIL) 500 MG CAPS Take 500 mg by mouth 2 (two) times daily.    [provider]  omeprazole (PRILOSEC) 40 MG capsule Take 1 capsule (40 mg total) by mouth daily. 03/12/15   Pyrtle, Lajuan Lines, MD  oxyCODONE (OXY IR/ROXICODONE) 5 MG immediate release tablet Take 1-2 tablets (5-10 mg total) by mouth every 6 (six) hours as needed for severe pain. 02/26/20   Edmisten, Kristie L, PA  pravastatin (PRAVACHOL) 40 MG tablet Take 40 mg by mouth daily. 01/12/20   [provider]  sitaGLIPtan-metformin (JANUMET) 50-500 MG per tablet Take 1 tablet by mouth 2 (two) times daily with a meal.    [provider]  vitamin B-12 (CYANOCOBALAMIN) 100 MCG tablet Take 100 mcg by mouth daily.    [provider]    Allergies    Pyridium [phenazopyridine hcl], Sulfa antibiotics, Ciprofloxacin hcl, Esomeprazole magnesium, Macrodantin [nitrofurantoin macrocrystal], Pantoprazole sodium, and Tramadol  Review of Systems   Review of Systems  Gastrointestinal:  Positive for abdominal pain.  All other systems reviewed and are negative.  Physical Exam Updated Vital Signs BP (!) 180/72   Pulse 74   Temp 97.8 F (36.6 C) (Oral)   Resp 18   Ht '4\' 8"'$  (1.422 m)   Wt 50 kg   SpO2 100%   BMI 24.71 kg/m   Physical Exam Vitals and nursing note reviewed.  Constitutional:      Appearance: She is well-developed.  HENT:     Head: Normocephalic and atraumatic.  Cardiovascular:     Rate and Rhythm: Normal rate and regular rhythm.     Heart sounds: No murmur heard. Pulmonary:     Effort: Pulmonary effort  is normal. No respiratory distress.     Breath sounds: Normal breath sounds.  Abdominal:     Palpations: Abdomen is soft.     Tenderness: There is no guarding or rebound.     Comments: Moderate right upper quadrant tenderness.  Musculoskeletal:        General: No swelling or tenderness.  Skin:    General: Skin is warm and dry.  Neurological:     Mental Status: She is alert and  oriented to person, place, and time.  Psychiatric:        Behavior: Behavior normal.    ED Results / Procedures / Treatments   Labs (all labs ordered are listed, but only abnormal results are displayed) Labs Reviewed  COMPREHENSIVE METABOLIC PANEL - Abnormal; Notable for the following components:      Result Value   Sodium 133 (*)    Glucose, Bld 157 (*)    BUN 28 (*)    Total Protein 6.4 (*)    All other components within normal limits  LIPASE, BLOOD - Abnormal; Notable for the following components:   Lipase 68 (*)    All other components within normal limits  CBC WITH DIFFERENTIAL/PLATELET - Abnormal; Notable for the following components:   RBC 3.50 (*)    Hemoglobin 11.2 (*)    HCT 32.5 (*)    All other components within normal limits  URINALYSIS, ROUTINE W REFLEX MICROSCOPIC - Abnormal; Notable for the following components:   APPearance CLOUDY (*)    Nitrite POSITIVE (*)    Leukocytes,Ua SMALL (*)    All other components within normal limits  URINALYSIS, MICROSCOPIC (REFLEX) - Abnormal; Notable for the following components:   Bacteria, UA MANY (*)    All other components within normal limits  URINE CULTURE    EKG None  Radiology CT Abdomen Pelvis W Contrast  Result Date: 11/21/2020 CLINICAL DATA:  Right-sided abdominal pain. EXAM: CT ABDOMEN AND PELVIS WITH CONTRAST TECHNIQUE: Multidetector CT imaging of the abdomen and pelvis was performed using the standard protocol following bolus administration of intravenous contrast. CONTRAST:  80m OMNIPAQUE IOHEXOL 350 MG/ML SOLN COMPARISON:   01/07/2015 FINDINGS: Lower chest: The lung bases are clear of an acute process. No pleural effusion. The heart is normal in size. No pericardial effusion. Aortic and coronary artery calcifications are noted. Moderate calcifications around the aortic valve are noted. Hepatobiliary: No hepatic lesions or intrahepatic biliary dilatation. The portal and hepatic veins are patent. Gallbladder is grossly normal. No common bile duct dilatation. Pancreas: No mass, inflammation or ductal dilatation. Spleen: Normal size. Scattered calcified splenic granulomas. Adrenals/Urinary Tract: Adrenal glands are normal. No renal lesions, renal calculi or hydronephrosis. Mild distention of the bladder. There is also mild trabeculation but no bladder mass or calculi. Stomach/Bowel: The stomach, duodenum, small bowel and colon are grossly normal. No acute inflammatory process, mass lesions obstructive findings. The terminal ileum and appendix are normal. Vascular/Lymphatic: Advanced atherosclerotic calcifications involving the aorta, iliac arteries and branch vessels but no aneurysm or dissection. The major venous structures are patent. No mesenteric or retroperitoneal mass or adenopathy. Reproductive: The uterus and ovaries are unremarkable. Other: No pelvic mass or adenopathy. No free pelvic fluid collections. No inguinal mass or adenopathy. No abdominal wall hernia or subcutaneous lesions. Musculoskeletal: No significant bony findings. IMPRESSION: 1. No acute abdominal/pelvic findings, mass lesions or adenopathy. 2. Advanced atherosclerotic calcifications involving the aorta, iliac arteries and branch vessels. Aortic Atherosclerosis (ICD10-I70.0). Electronically Signed   By: PMarijo SanesM.D.   On: 11/21/2020 05:52    Procedures Procedures   Medications Ordered in ED Medications  cefTRIAXone (ROCEPHIN) 1 g in sodium chloride 0.9 % 100 mL IVPB (has no administration in time range)  sodium chloride 0.9 % bolus 500 mL (has no  administration in time range)  fentaNYL (SUBLIMAZE) injection 50 mcg (50 mcg Intravenous Given 11/21/20 0506)  iohexol (OMNIPAQUE) 350 MG/ML injection 100 mL (85 mLs Intravenous Contrast Given 11/21/20 0529)  ondansetron (ZOFRAN) injection 4  mg (4 mg Intravenous Given 11/21/20 [id number])    ED Course  I have reviewed the triage vital signs and the nursing notes.  Pertinent labs & imaging results that were available during my care of the patient were reviewed by me and considered in my medical decision making (see chart for details).    MDM Rules/Calculators/A&P                          patient here for evaluation of right-sided abdominal pain radiating to the central abdomen that started around 830 last night. She does have focal tenderness on examination without peritoneal findings. She does self cath at home. UA is concerning for UTI. Labs with mild elevation in her BUN, lipase, stable anemia. CT scan was obtained, which is negative for acute process. No evidence of acute pancreatitis, obstructing stone, appendicitis. LFTs are within normal limits and no biliary changes on CT scan. Given her symptoms do recommend that she has a ultrasound to further evaluate for cholecystitis and patient declines at this time. Will treat for UTI in setting of her symptoms with antibiotics. Plan to discharge home with close outpatient follow-up and return precautions.  Final Clinical Impression(s) / ED Diagnoses Final diagnoses:  Acute UTI  Right upper quadrant abdominal pain    Rx / DC Orders ED Discharge Orders          Ordered    cefdinir (OMNICEF) 300 MG capsule  2 times daily        11/21/20 0625    ondansetron (ZOFRAN ODT) 4 MG disintegrating tablet  Every 8 hours PRN        11/21/20 0625             Quintella Reichert, MD 11/21/20 [phone]

## 2020-11-21 NOTE — Telephone Encounter (Signed)
Pt called c/o abd pain. Lauren [name] been ER this am for abd pain,but did not know what cause due to her son with her. Told her need go back to ER. She will reach out to son.

## 2020-11-21 NOTE — ED Provider Notes (Signed)
Emergency Medicine Provider Triage Evaluation Note  Lauren [name] , a 80 y.o. female  was evaluated in triage.  Pt complains of worsening epigastric/right sided abdominal pain. Was seen at [facility] overnight for same with unequivocal CT scan. Her urine did appear infectious and so she was discharged home with omnicef and zofran. Family reports worsening pain today - pt did not go pick up her medications yet however they brought her here for further eval. It does appear pt chart review earlier ED provider wanted a RUQ ultrasound however pt declined at that time.   Review of Systems  Positive: + abdominal pain Negative: - nausea, vomiting, diarrhea  Physical Exam  BP 105/87   Pulse 77   Temp 98.8 F (37.1 C) (Oral)   Resp 18   SpO2 100%  Gen:   Awake, no distress   Resp:  Normal effort  MSK:   Moves extremities without difficulty  Other:  + epigastric/RUQ TTP  Medical Decision Making  Medically screening exam initiated at 1:25 PM.  Appropriate orders placed.  Verlie MUNACHI HOLLOPETER was informed that the remainder of the evaluation will be completed by another provider, this initial triage assessment does not replace that evaluation, and the importance of remaining in the ED until their evaluation is complete.  RUQ ultrasound ordered at this time. Pt just had labs done. Do not feel she requires repeat labs. If RUQ ultrasound negative will discharge with diagnosis UTI and instructions to start taking medications for same.    Eustaquio Maize, PA-C 11/21/20 1328    Blanchie Dessert, MD 11/24/20 1146

## 2020-11-21 NOTE — Discharge Instructions (Addendum)
Get rechecked if you develop worsening pain, fevers or inability to keep food and liquids down.

## 2020-11-21 NOTE — ED Notes (Signed)
PT tolerated PO ginger ale with no nausea.

## 2020-11-21 NOTE — Discharge Instructions (Addendum)
Your ultrasound showed findings of gallbladder sludge which is likely the cause of your pain.  Please see additional information on eating plan to help reduce pain related to your gallbladder problem.   Call [facility] to schedule an outpatient follow up appointment to discuss these ultrasound findings/possible elective surgical removal of your gallbladder.   I would recommend taking 1,000 mg Tylenol (2 double strength Tylenol) every 8 hours AS NEEDED for pain.   Please also pick up the medications prescribed to your during your previous ED visit.   Follow up with your PCP regarding ED visits today.   Return to the ED IMMEDIATELY for any worsening pain, pain lasting > 4 hours, fevers > 100.4, or any other new/concerning symptoms.

## 2020-11-21 NOTE — ED Provider Notes (Signed)
[place] DEPT Provider Note   CSN: [id number] Arrival date & time: 11/21/20  1244     History Chief Complaint  Patient presents with   Flank Pain    Lauren [name] is a 80 y.o. female with complaint of ongoing epigastric/RUQ pain.  Per chart review patient was seen at [place] overnight for similar symptoms.  She had a reassuring work-up with an unequivocal CT scan.  Her urine did appear somewhat infectious at that time and she was discharged home with [facility] and Zofran.  She went home and was waiting for the prescription to be ready in 2 hours as her pharmacy was not open yet when she began having worsening pain.  She called a friend who brought her to this ED for further evaluation.  Per chart review it does appear that a right upper quadrant ultrasound was discussed due to unequivocal CT scan however patient declined at that time as her pain was improved.  She still has her gallbladder.  She denies any fevers or chills.  The history is provided by the patient and a friend. The history is limited by a language barrier. A language interpreter was used.      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   Possible related to pyridium  G6PD studies pending   Hemorrhoids    Hiatal hernia    Hyperlipidemia    Hyperlipidemia    Hypertension    Hypothyroidism    IBS (irritable bowel syndrome)    Iron overload 07/13/2016   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   Osteoporosis    UTI (lower urinary tract infection)    Vitamin D deficiency     Patient Active Problem List   Diagnosis Date Noted   OA (osteoarthritis) of knee 02/25/2020   Primary osteoarthritis of left knee 02/25/2020   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[place])  07/14/2016   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   Iron overload 07/13/2016   Knee pain, bilateral 07/06/2016   Upset stomach 05/18/2016   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   Ear ringing 02/23/2016   Fecal incontinence 01/03/2015   Nausea without vomiting [id number]   Periumbilical abdominal pain 01/03/2015   HTN (hypertension) 01/25/2011   Hyperlipidemia 01/25/2011   Diabetes mellitus 01/25/2011   GERD (gastroesophageal reflux disease) 01/25/2011   Osteoporosis 01/25/2011   Hypothyroidism 01/25/2011    Past Surgical History:  Procedure Laterality Date   COLONOSCOPY     PARTIAL KNEE ARTHROPLASTY Left 02/25/2020   Procedure: UNICOMPARTMENTAL KNEE;  Surgeon: Gaynelle Arabian, MD;  Location: [facility] ORS;  Service: Orthopedics;  Laterality: Left;  72mn     OB History     Gravida  5   Para  3   Term      Preterm      AB  2   Living  3      SAB      IAB  2   Ectopic      Multiple      Live Births              Family History  Problem Relation Age of Onset   Colon cancer Neg Hx  Social History   Tobacco Use   Smoking status: Never   Smokeless tobacco: Never  Substance Use Topics   Alcohol use: No   Drug use: No    Home Medications Prior to Admission medications   Medication Sig Start Date End Date Taking? Authorizing Provider  ascorbic acid (VITAMIN C) 500 MG tablet Take 500 mg by mouth daily.    [provider]  cefdinir (OMNICEF) 300 MG capsule Take 1 capsule (300 mg total) by mouth 2 (two) times daily. 11/21/20   Quintella Reichert, MD  Cholecalciferol (VITAMIN D3) 2000 UNITS TABS Take 1 tablet by mouth daily.    [provider]  COVID-19 mRNA Vac-TriS, Pfizer, SUSP injection Inject into the muscle. 09/23/20   Carlyle Basques, MD  glucosamine-chondroitin 500-400 MG tablet Take 1 tablet by mouth 3 (three) times daily.    [provider]  levothyroxine (SYNTHROID, LEVOTHROID) 75 MCG tablet Take 75 mcg by mouth daily.    [provider]  losartan (COZAAR) 100 MG tablet Take 100 mg by mouth daily.    [provider]  methocarbamol (ROBAXIN) 500 MG tablet Take 1 tablet (500 mg total) by mouth every 6 (six) hours as needed for muscle spasms. 02/26/20   Edmisten, Ok Anis, PA  Multiple Vitamin (MULTIVITAMIN) tablet Take 1 tablet by mouth daily.    [provider]  Omega-3 Fatty Acids (FISH OIL) 500 MG CAPS Take 500 mg by mouth 2 (two) times daily.    [provider]  omeprazole (PRILOSEC) 40 MG capsule Take 1 capsule (40 mg total) by mouth daily. 03/12/15   Pyrtle, Lajuan Lines, MD  ondansetron (ZOFRAN ODT) 4 MG disintegrating tablet Take 1 tablet (4 mg total) by mouth every 8 (eight) hours as needed for nausea or vomiting. 11/21/20   Quintella Reichert, MD  oxyCODONE (OXY IR/ROXICODONE) 5 MG immediate release tablet Take 1-2 tablets (5-10 mg total) by mouth every 6 (six) hours as needed for severe pain. 02/26/20   Edmisten, Kristie L, PA  pravastatin (PRAVACHOL) 40 MG tablet Take 40 mg by mouth daily. 01/12/20   [provider]  sitaGLIPtan-metformin (JANUMET) 50-500 MG per tablet Take 1 tablet by mouth 2 (two) times daily with a meal.    [provider]  vitamin B-12 (CYANOCOBALAMIN) 100 MCG tablet Take 100 mcg by mouth daily.    [provider]    Allergies    Pyridium [phenazopyridine hcl], Sulfa antibiotics, Ciprofloxacin hcl, Esomeprazole magnesium, Macrodantin [nitrofurantoin macrocrystal], Pantoprazole sodium, and Tramadol  Review of Systems   Review of Systems  Constitutional:  Negative for chills and fever.  Gastrointestinal:  Positive for abdominal pain. Negative for diarrhea, nausea and vomiting.  All other systems reviewed and are negative.  Physical Exam Updated Vital Signs BP (!) 146/72   Pulse 75   Temp 98.8 F (37.1 C) (Oral)   Resp 19   SpO2 99%   Physical Exam Vitals and nursing note reviewed.  Constitutional:      Appearance: She is not  ill-appearing or diaphoretic.  HENT:     Head: Normocephalic and atraumatic.  Eyes:     Conjunctiva/sclera: Conjunctivae normal.  Cardiovascular:     Rate and Rhythm: Normal rate and regular rhythm.     Pulses: Normal pulses.  Pulmonary:     Effort: Pulmonary effort is normal.     Breath sounds: Normal breath sounds. No wheezing, rhonchi or rales.  Abdominal:     Palpations: Abdomen is soft.  Tenderness: There is abdominal tenderness. There is no guarding or rebound.     Comments: Soft, + epigastric and RUQ TTP, +BS throughout, no r/g/r, neg murphy's, neg mcburney's, no CVA TTP  Musculoskeletal:     Cervical back: Neck supple.  Skin:    General: Skin is warm and dry.  Neurological:     Mental Status: She is alert.    ED Results / Procedures / Treatments   Labs (all labs ordered are listed, but only abnormal results are displayed) Labs Reviewed - No data to display  EKG None  Radiology CT Abdomen Pelvis W Contrast  Result Date: 11/21/2020 CLINICAL DATA:  Right-sided abdominal pain. EXAM: CT ABDOMEN AND PELVIS WITH CONTRAST TECHNIQUE: Multidetector CT imaging of the abdomen and pelvis was performed using the standard protocol following bolus administration of intravenous contrast. CONTRAST:  65m OMNIPAQUE IOHEXOL 350 MG/ML SOLN COMPARISON:  01/07/2015 FINDINGS: Lower chest: The lung bases are clear of an acute process. No pleural effusion. The heart is normal in size. No pericardial effusion. Aortic and coronary artery calcifications are noted. Moderate calcifications around the aortic valve are noted. Hepatobiliary: No hepatic lesions or intrahepatic biliary dilatation. The portal and hepatic veins are patent. Gallbladder is grossly normal. No common bile duct dilatation. Pancreas: No mass, inflammation or ductal dilatation. Spleen: Normal size. Scattered calcified splenic granulomas. Adrenals/Urinary Tract: Adrenal glands are normal. No renal lesions, renal calculi or  hydronephrosis. Mild distention of the bladder. There is also mild trabeculation but no bladder mass or calculi. Stomach/Bowel: The stomach, duodenum, small bowel and colon are grossly normal. No acute inflammatory process, mass lesions obstructive findings. The terminal ileum and appendix are normal. Vascular/Lymphatic: Advanced atherosclerotic calcifications involving the aorta, iliac arteries and branch vessels but no aneurysm or dissection. The major venous structures are patent. No mesenteric or retroperitoneal mass or adenopathy. Reproductive: The uterus and ovaries are unremarkable. Other: No pelvic mass or adenopathy. No free pelvic fluid collections. No inguinal mass or adenopathy. No abdominal wall hernia or subcutaneous lesions. Musculoskeletal: No significant bony findings. IMPRESSION: 1. No acute abdominal/pelvic findings, mass lesions or adenopathy. 2. Advanced atherosclerotic calcifications involving the aorta, iliac arteries and branch vessels. Aortic Atherosclerosis (ICD10-I70.0). Electronically Signed   By: PMarijo SanesM.D.   On: 11/21/2020 05:52   [place]Abdomen Limited RUQ (LIVER/GB)  Result Date: 11/21/2020 CLINICAL DATA:  Right upper quadrant abdominal pain EXAM: ULTRASOUND ABDOMEN LIMITED RIGHT UPPER QUADRANT COMPARISON:  CT 11/21/2020 FINDINGS: Gallbladder: Small amount of biliary sludge is noted in the gallbladder lumen. No shadowing gallstones or wall thickening visualized. No sonographic Murphy sign noted by sonographer. Common bile duct: Diameter: 7.5 mm. Liver: No focal lesion identified. Within normal limits in parenchymal echogenicity. Portal vein is patent on color Doppler imaging with normal direction of blood flow towards the liver. Other: None. IMPRESSION: 1. Small amount of biliary sludge within the gallbladder lumen. No sonographic evidence of acute cholecystitis. 2. Remainder of the exam is within normal limits. Electronically Signed   By: NDavina PokeD.O.   On: 11/21/2020  14:36    Procedures Procedures   Medications Ordered in ED Medications  fentaNYL (SUBLIMAZE) injection 50 mcg (50 mcg Intramuscular Given 11/21/20 1417)    ED Course  I have reviewed the triage vital signs and the nursing notes.  Pertinent labs & imaging results that were available during my care of the patient were reviewed by me and considered in my medical decision making (see chart for details).    MDM  Rules/Calculators/A&P                           80 year old [place] speaking female who returns to the ED today for worsening abdominal pain.  Seen overnight in the ED with reassuring work-up however right upper quadrant ultrasound was discussed, patient declined and was discharged home with Rx for UTI.  She returns for worsening pain.  On arrival her vitals are stable and she appears to be no acute distress.  She is slightly uncomfortable on exam.  She is noted to have epigastric and right upper quadrant tenderness palpation.  We will provide dose of pain medication and obtain right upper quadrant ultrasound.  Given she has had lab work done in the last 12 hours I do not feel she requires repeat work at this time.  If right upper quadrant ultrasound reassuring/no signs of acute cholecystitis will discharge home.  Ultrasound with small amount of biliary sludge.  No signs of acute cholecystitis.  Her LFTs and white blood cell count were within normal limits earlier this morning.  She is afebrile.  Her pain is improved with pain medication in the ED today.  Will discharge home at this time with general surgery follow-up.  Have discussed dietary changes with family and patient at bedside using interpreter.  She is instructed to continue taking the [facility] and Zofran that she was prescribed earlier today.  Have discussed Tylenol given her age for pain.  Have discussed strict return precautions.  Stable for discharge.   This note was prepared using Dragon voice recognition software and may include  unintentional dictation errors due to the inherent limitations of voice recognition software.   Final Clinical Impression(s) / ED Diagnoses Final diagnoses:  RUQ abdominal pain  Biliary sludge determined by ultrasound    Rx / DC Orders ED Discharge Orders     None        Discharge Instructions      Your ultrasound showed findings of gallbladder sludge which is likely the cause of your pain.  Please see additional information on eating plan to help reduce pain related to your gallbladder problem.   Call [facility] to schedule an outpatient follow up appointment to discuss these ultrasound findings/possible elective surgical removal of your gallbladder.   I would recommend taking 1,000 mg Tylenol (2 double strength Tylenol) every 8 hours AS NEEDED for pain.   Please also pick up the medications prescribed to your during your previous ED visit.   Follow up with your PCP regarding ED visits today.   Return to the ED IMMEDIATELY for any worsening pain, pain lasting > 4 hours, fevers > 100.4, or any other new/concerning symptoms.        Eustaquio Maize, PA-C 11/21/20 1540    Blanchie Dessert, MD 11/24/20 1147

## 2020-11-22 ENCOUNTER — Telehealth: Payer: Self-pay | Admitting: *Deleted

## 2020-11-22 NOTE — Telephone Encounter (Signed)
Pt came back home after DC ER [facility], sent me photos of DC note. And medicine .  Sent her back photos with translate and medication instruction. Did not see gall bladder diet copies.  Remains nausea and stomach pain. Told her prelosec  with empty stomach as prescribe per Dr. And antibioyic one tab twice a day, zofran 1 tab prn q8hrs. Sent copy of photos to her daughter who lives [place].also can take Tylenol 1g q6 hrs per pain.according to DC instruction ,need call general surgeon for gall bladder sludge. Pt has appointment with primary Dr. On 11/27/2020 for regular check up.

## 2020-11-23 ENCOUNTER — Telehealth: Payer: Self-pay | Admitting: *Deleted

## 2020-11-23 ENCOUNTER — Encounter: Payer: Self-pay | Admitting: *Deleted

## 2020-11-23 LAB — URINE CULTURE: Culture: >=100000 — AB

## 2020-11-23 NOTE — Congregational Nurse Program (Signed)
  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 Yes    Visit Setting Home    Location Patient Served At Not Applicable   home visit   Patient Status Immigrant    Medical Provider Yes    Insurance Medicare    Intervention Spiritual Care;Educate;Counsel    Housing/Utilities No permanent housing    Transportation --   na   Interpersonal Safety --   na   Food --   na   Medication --   na   Referrals Other   call general  surgeon on monday for appointment   ED Visit Averted --   na   Life-Saving Intervention Made --   na          Visited home after ER visit. Remains abd. Pain to control with tylenol. Encouraged to eat small amount frequent. Explained gall bladder diet. So far pain level is tolerated. Will call general surgeon on Monday for appointment. Will f/u.

## 2020-11-23 NOTE — Telephone Encounter (Signed)
During the night, pt had severe pain, after 2-3hrs after tylenol taken, pain subsides. Encouraging eat small amount food without fat frequent.

## 2020-11-24 ENCOUNTER — Telehealth: Payer: Self-pay | Admitting: *Deleted

## 2020-11-24 NOTE — Telephone Encounter (Signed)
Post ED Visit - Positive Culture Follow-up  Culture report reviewed by antimicrobial stewardship pharmacist: [facility] Team '[]'$  Elenor Quinones, Pharm.D. '[]'$  Heide Guile, Pharm.D., BCPS AQ-ID '[]'$  Parks Neptune, Pharm.D., BCPS '[]'$  Alycia Rossetti, Pharm.D., BCPS '[]'$  [place], Pharm.D., BCPS, AAHIVP '[]'$  Legrand Como, Pharm.D., BCPS, AAHIVP '[]'$  Salome Arnt, PharmD, BCPS '[]'$  Johnnette Gourd, PharmD, BCPS '[]'$  Hughes Better, PharmD, BCPS '[]'$  Leeroy Cha, PharmD '[]'$  Laqueta Linden, PharmD, BCPS '[]'$  Albertina Parr, PharmD  [place] Team '[]'$  Leodis Sias, PharmD '[]'$  Lindell Spar, PharmD '[]'$  Royetta Asal, PharmD '[]'$  Graylin Shiver, Rph '[]'$  Rema Fendt) Glennon Mac, PharmD '[]'$  Arlyn Dunning, PharmD '[]'$  Netta Cedars, PharmD '[]'$  Dia Sitter, PharmD '[]'$  Leone Haven, PharmD '[]'$  Gretta Arab, PharmD '[]'$  Theodis Shove, PharmD '[]'$  Peggyann Juba, PharmD '[]'$  Reuel Boom, PharmD   Positive urine culture Treated with Cefdinir, organism sensitive to the same and no further patient follow-up is required at this time.  Joetta Manners, PharmD  Harlon Flor Talley 11/24/2020, 9:08 AM

## 2020-11-24 NOTE — Telephone Encounter (Signed)
Pt called  for Dr's visit regarding general surgeon, made appointment on 12/04/2020 11;30am Dr Marlou Starks. Need arrange to take ptto the Dr's office.  Daughter called from [place] that she could come end of September. Discuss with her Pt's status and care plan with her families. Still need arrange to take pt to primary Dr's office 11/27/2020 @ 0930.  Pt said pain is better but poor appetite. Reminded her eat small amount and frequently.  Do not skip medication. Spent more than 1.5 hrs.

## 2020-11-25 ENCOUNTER — Telehealth: Payer: Self-pay | Admitting: *Deleted

## 2020-11-25 NOTE — Telephone Encounter (Signed)
Tried arranged general surgeon visit 12/04/2020, not available. Changed appointment to 12/09/2020 when daughter available. Notified to pt. and daughter. Pt remains pain with nausea. She is taking zofran for nausea, left only 2 tab.taking tylenol 1g Q 6hrs per pt.will see how she is doing til visit primary Dr. 11/27/2020.

## 2020-11-27 DIAGNOSIS — E1165 Type 2 diabetes mellitus with hyperglycemia: Secondary | ICD-10-CM | POA: Diagnosis not present

## 2020-11-27 DIAGNOSIS — R110 Nausea: Secondary | ICD-10-CM | POA: Diagnosis not present

## 2020-11-27 DIAGNOSIS — E7800 Pure hypercholesterolemia, unspecified: Secondary | ICD-10-CM | POA: Diagnosis not present

## 2020-11-27 DIAGNOSIS — G4452 New daily persistent headache (NDPH): Secondary | ICD-10-CM | POA: Diagnosis not present

## 2020-11-27 DIAGNOSIS — K219 Gastro-esophageal reflux disease without esophagitis: Secondary | ICD-10-CM | POA: Diagnosis not present

## 2020-11-27 DIAGNOSIS — I10 Essential (primary) hypertension: Secondary | ICD-10-CM | POA: Diagnosis not present

## 2020-11-27 DIAGNOSIS — N182 Chronic kidney disease, stage 2 (mild): Secondary | ICD-10-CM | POA: Diagnosis not present

## 2020-11-27 DIAGNOSIS — N390 Urinary tract infection, site not specified: Secondary | ICD-10-CM | POA: Diagnosis not present

## 2020-11-27 DIAGNOSIS — R339 Retention of urine, unspecified: Secondary | ICD-10-CM | POA: Diagnosis not present

## 2020-11-27 DIAGNOSIS — E039 Hypothyroidism, unspecified: Secondary | ICD-10-CM | POA: Diagnosis not present

## 2020-11-29 ENCOUNTER — Encounter: Payer: Self-pay | Admitting: *Deleted

## 2020-11-29 NOTE — Congregational Nurse Program (Signed)
  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 Yes    Visit Setting Other   Dr's  Office   Location Patient Served At Not Applicable   Dr.Collin's office   Patient Status Immigrant    Medical Provider Yes    Insurance Medicare    Intervention Spiritual Care;Support   interpreter   Housing/Utilities No permanent housing    Transportation Need transportation assistance   at times   Interpersonal Safety --   NA   Food --   NA   Medication --   NA   ED Visit Averted --   NA   Life-Saving Intervention Made --   NA           f/u with Katie tayler for Dr. Theda Sers. Lab value is ok. Ok to hold for fish oil. If increase in 6 month will restart.  Told episode of UTI and Gall bladder problems. No stomach or abd pain except HA. She can take Tylenol for HA, if HA is not getting better week later call back for evaluation. Pt said 3tab antibiotic left to finish, no meds. For nausea.  Dr will send RX to [facility] for Zofran. Otherwise see in 6 month. Appointment made . Went to [facility] to pick med, that needs wait 1 hr. Her son will pick it up. Spent 2.5hrs.

## 2020-12-09 DIAGNOSIS — R413 Other amnesia: Secondary | ICD-10-CM | POA: Insufficient documentation

## 2020-12-09 DIAGNOSIS — D509 Iron deficiency anemia, unspecified: Secondary | ICD-10-CM | POA: Diagnosis present

## 2020-12-09 DIAGNOSIS — K838 Other specified diseases of biliary tract: Secondary | ICD-10-CM | POA: Diagnosis not present

## 2020-12-09 DIAGNOSIS — H9190 Unspecified hearing loss, unspecified ear: Secondary | ICD-10-CM | POA: Insufficient documentation

## 2020-12-09 DIAGNOSIS — R42 Dizziness and giddiness: Secondary | ICD-10-CM

## 2020-12-09 DIAGNOSIS — R339 Retention of urine, unspecified: Secondary | ICD-10-CM | POA: Insufficient documentation

## 2020-12-09 DIAGNOSIS — N10 Acute tubulo-interstitial nephritis: Secondary | ICD-10-CM | POA: Diagnosis not present

## 2020-12-09 DIAGNOSIS — N390 Urinary tract infection, site not specified: Secondary | ICD-10-CM | POA: Diagnosis present

## 2020-12-09 DIAGNOSIS — N182 Chronic kidney disease, stage 2 (mild): Secondary | ICD-10-CM | POA: Diagnosis present

## 2020-12-11 DIAGNOSIS — R338 Other retention of urine: Secondary | ICD-10-CM | POA: Diagnosis not present

## 2020-12-11 DIAGNOSIS — N3020 Other chronic cystitis without hematuria: Secondary | ICD-10-CM | POA: Diagnosis not present

## 2020-12-18 DIAGNOSIS — R339 Retention of urine, unspecified: Secondary | ICD-10-CM | POA: Diagnosis not present

## 2021-01-01 DIAGNOSIS — E119 Type 2 diabetes mellitus without complications: Secondary | ICD-10-CM | POA: Diagnosis not present

## 2021-01-01 DIAGNOSIS — H40013 Open angle with borderline findings, low risk, bilateral: Secondary | ICD-10-CM | POA: Diagnosis not present

## 2021-01-01 DIAGNOSIS — H353131 Nonexudative age-related macular degeneration, bilateral, early dry stage: Secondary | ICD-10-CM | POA: Diagnosis not present

## 2021-01-01 DIAGNOSIS — H2513 Age-related nuclear cataract, bilateral: Secondary | ICD-10-CM | POA: Diagnosis not present

## 2021-01-04 ENCOUNTER — Encounter: Payer: Self-pay | Admitting: *Deleted

## 2021-01-04 NOTE — Congregational Nurse Program (Signed)
  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 Yes    Location Patient Served  Not Applicable   [place] or Organization    Patient Status Immigrant    Insurance Medicare    Insurance Referral Medicaid    Medication N/A    Medical Provider Yes    Screening Referrals N/A    Medical Referral N/A    Medical Appointment Made N/A    Food N/A    Transportation N/A    Housing/Utilities No permanent housing    Interpersonal Safety N/A    Intervention Blood glucose;Blood pressure;Spiritual Care;Support    ED Visit Averted N/A    Life-Saving Intervention Made N/A            pt said her CBG going up. Checked CBG was 103, recently cbg strip changed. Asked bring in glucometer to check up. Educated safety issues. Will F/u

## 2021-01-10 DIAGNOSIS — R339 Retention of urine, unspecified: Secondary | ICD-10-CM | POA: Diagnosis not present

## 2021-03-03 DIAGNOSIS — R339 Retention of urine, unspecified: Secondary | ICD-10-CM | POA: Diagnosis not present

## 2021-03-20 ENCOUNTER — Encounter: Payer: Self-pay | Admitting: *Deleted

## 2021-03-20 NOTE — Congregational Nurse Program (Signed)
Visit pt's home. Finished antibiotic 1week ago, remains foggy head. Pt said her cbg is 105-110 recently. Also found her urology Dr's appointment was today unless changed- got appointment change notice on 01/03/2021 via mail- Daughter did not changed. Called to clinic ,made appointment 03/25/2021 10:15am. Will arrange ride that morning. Spiritual support given.

## 2021-03-25 ENCOUNTER — Encounter: Payer: Self-pay | Admitting: *Deleted

## 2021-03-25 NOTE — Congregational Nurse Program (Signed)
Visited Dr. Claudia Desanctis for f/p . Pt finished 3 month antibiotic for UTI, last dose was 2 weeks ago. Today's UA looks ok. [facility] 4 month f/u. Continue to I&O cath 6x/day.  Refill order made to [facility] today. Give ride  spending 4 hrs.

## 2021-04-04 IMAGING — MR MR KNEE*L* W/O CM
4 of 6 series · 20 of 40 positions shown · non-contrast
Comparison: None.

CLINICAL DATA: Left anterior knee pain.  Ongoing for 4 weeks.

EXAM:
MRI OF THE LEFT KNEE WITHOUT CONTRAST
TECHNIQUE: Multiplanar, multisequence MR imaging of the knee was performed. No
intravenous contrast was administered.

[Series 5: T2 fat-sat · axial · 4.0mm · 0.50mm/px · z∈[-44,+36]mm · 3 of 24 slices shown (1 of 2)]
[im 4/24]
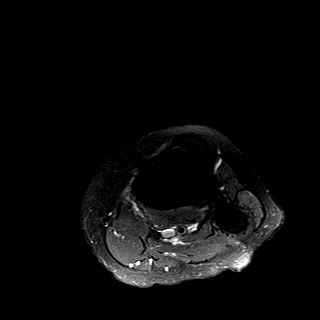
[im 12/24]
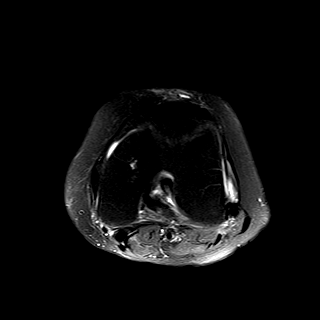
[im 20/24]
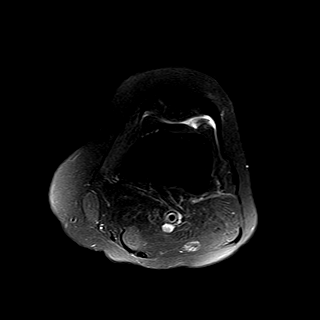

[Series 6: T2 fat-sat · coronal · 4.0mm · 0.29mm/px · 3 of 21 slices shown (2 of 2)]
[im 5/21]
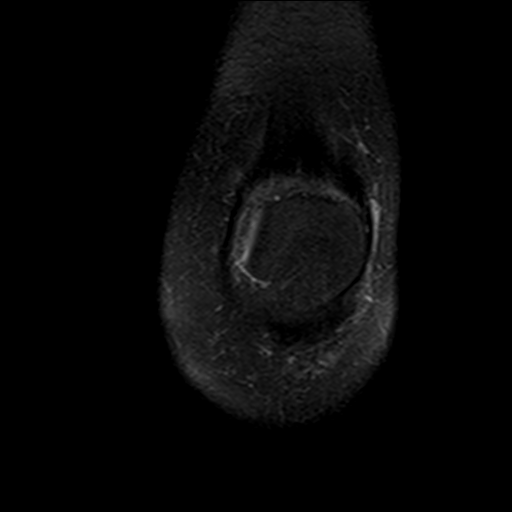
[im 13/21]
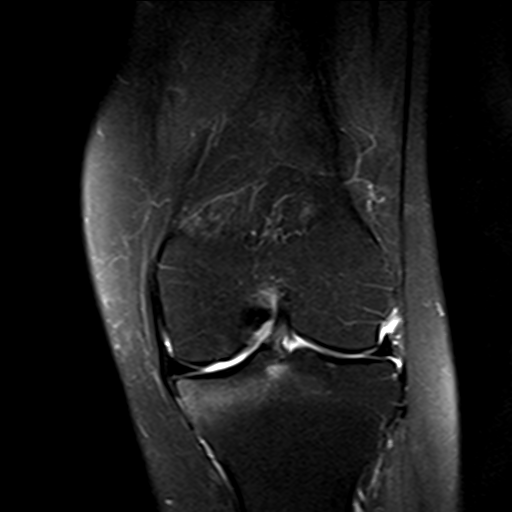
[im 21/21]
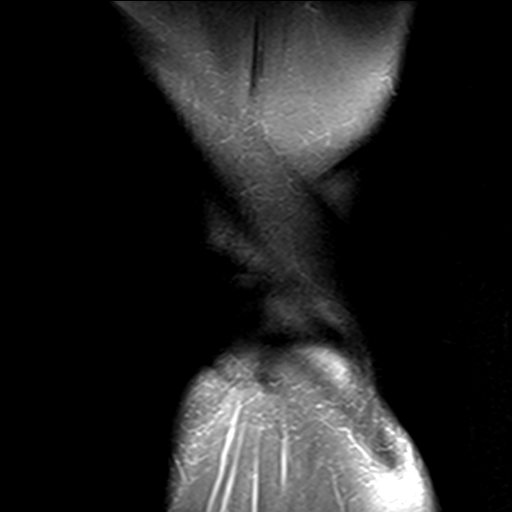

[Series 8: PD fat-sat · sagittal · 3.0mm · 0.29mm/px · 7 of 25 slices shown (1 of 2)]
[im 1/25]
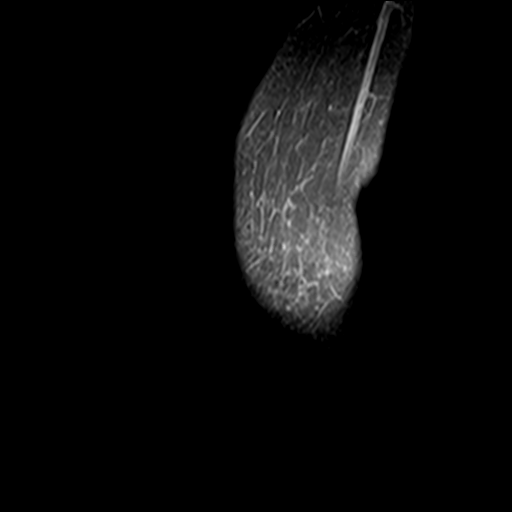
[im 5/25]
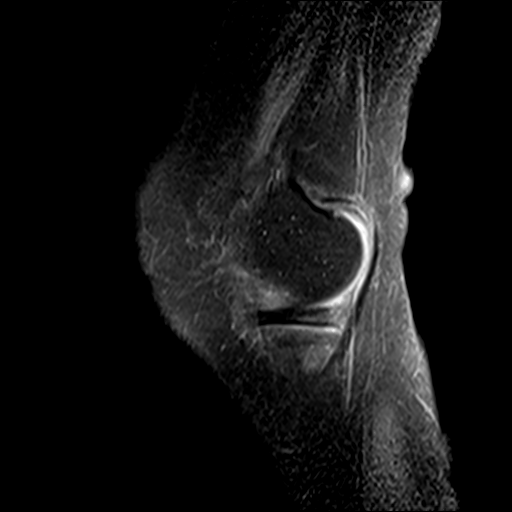
[im 9/25]
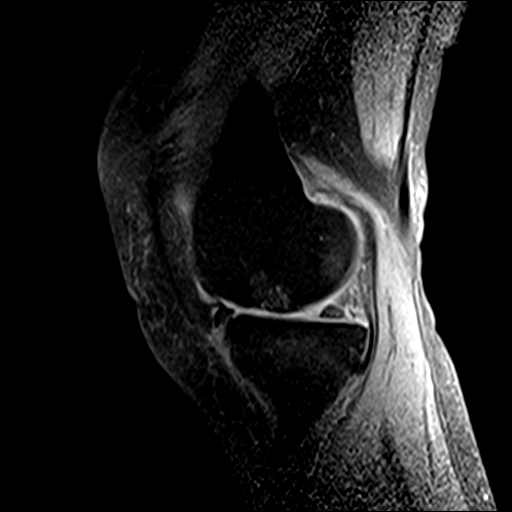
[im 13/25]
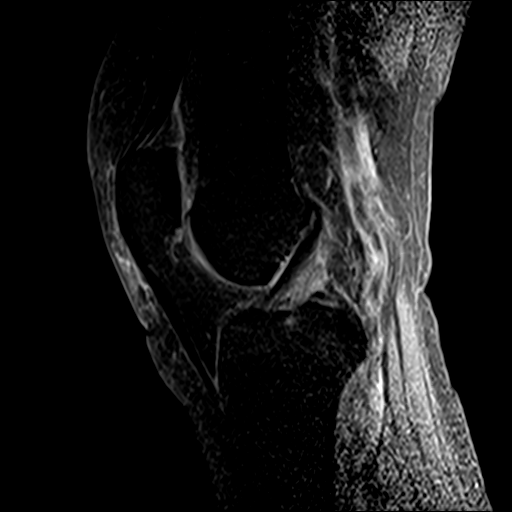
[im 17/25]
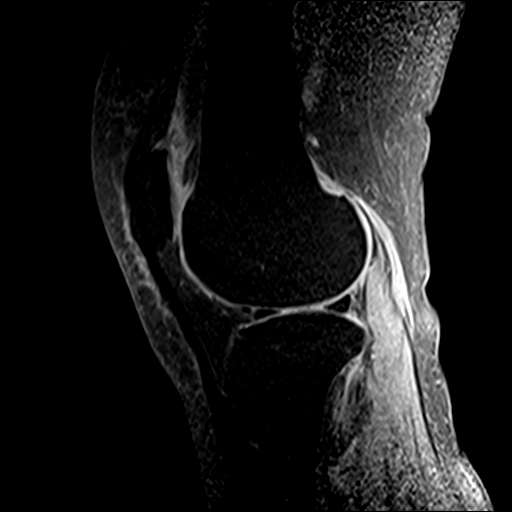
[im 21/25]
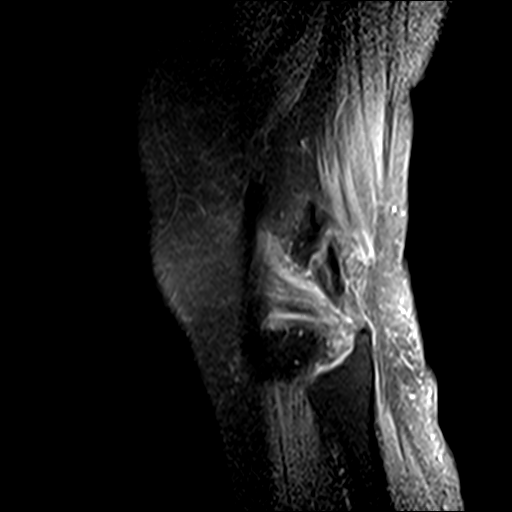
[im 25/25]
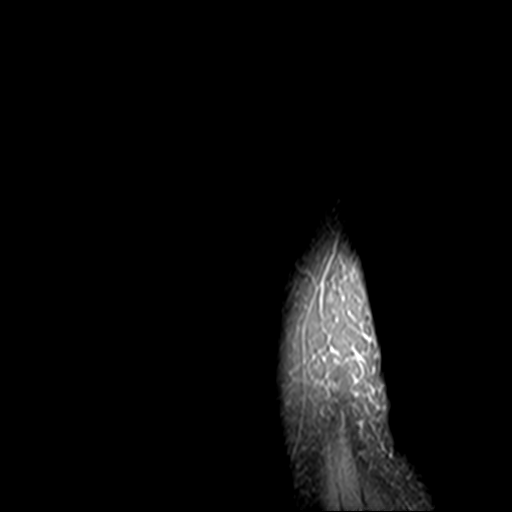

[Series 9: PD fat-sat · coronal · 3.0mm · 0.29mm/px · 7 of 25 slices shown (2 of 2)]
[im 1/25]
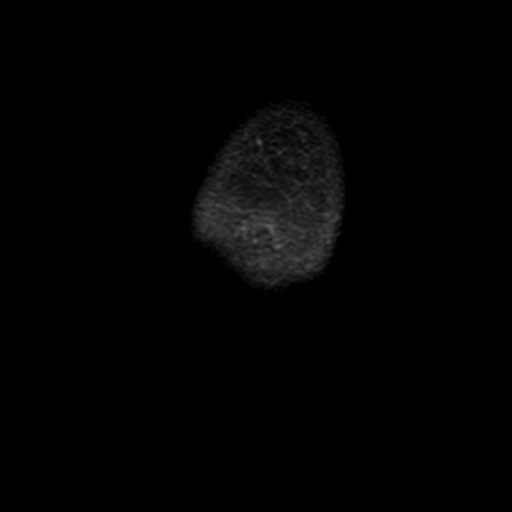
[im 5/25]
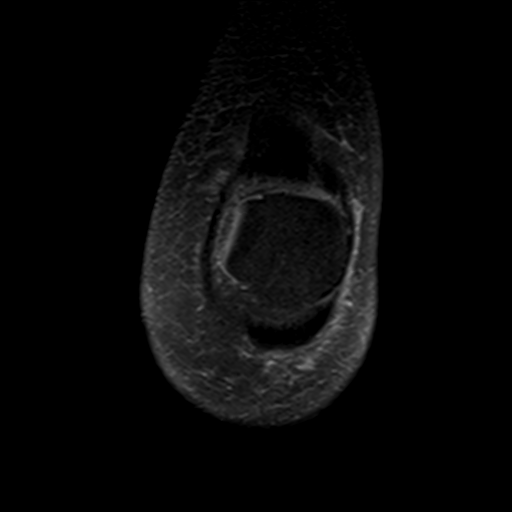
[im 9/25]
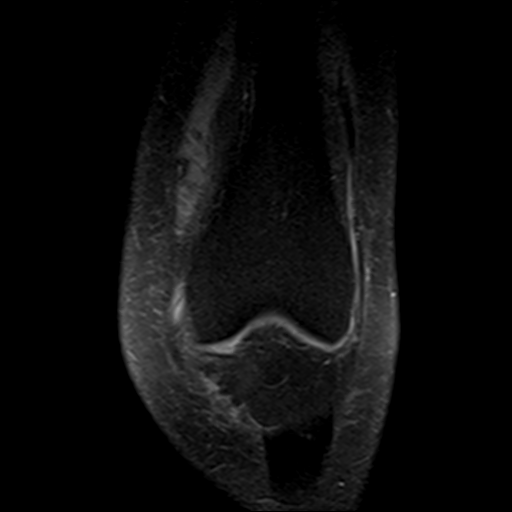
[im 13/25]
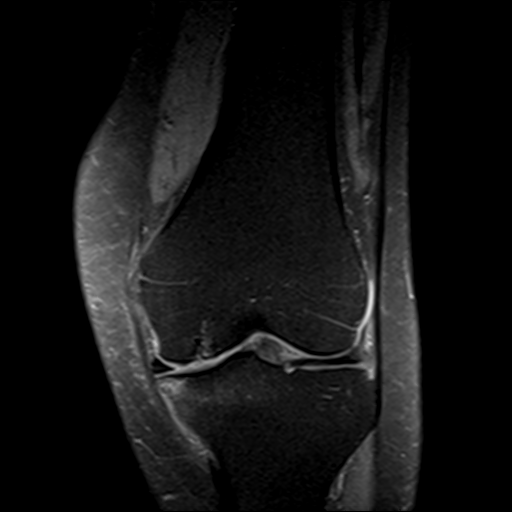
[im 17/25]
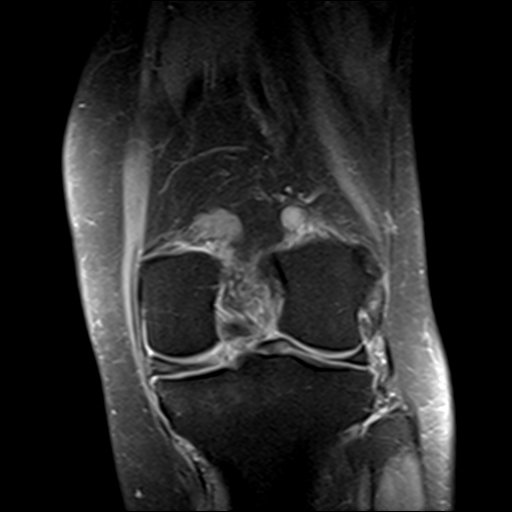
[im 21/25]
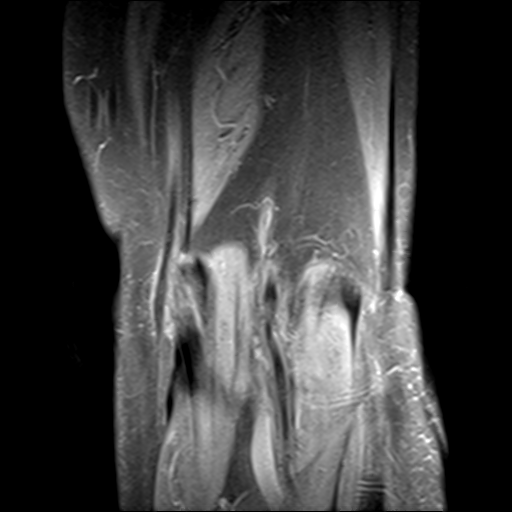
[im 25/25]
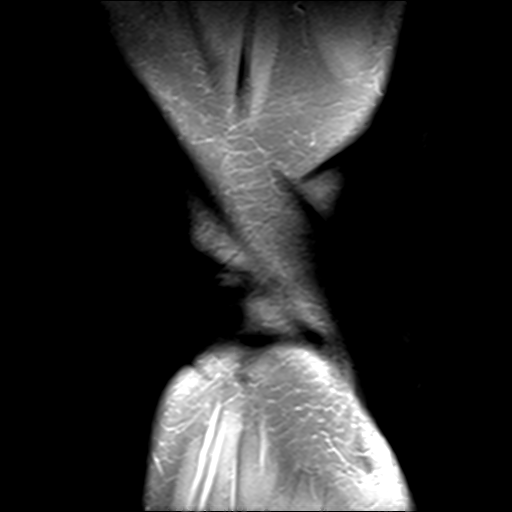

[20 of 40 positions shown; findings below may reference images not displayed]

FINDINGS: MENISCI

Medial meniscus: Radial tear of the posterior horn of the medial
meniscus at the root. Increased signal in the posterior horn of the
medial meniscus consistent with degeneration.

Lateral meniscus: Small horizontal tear of the body of the lateral
meniscus extending to the free edge.

LIGAMENTS

Cruciates:  Intact ACL and PCL.

Collaterals: Medial collateral ligament is intact. Lateral
collateral ligament complex is intact.

CARTILAGE

Patellofemoral:  No focal chondral defect.

Medial: Full-thickness cartilage loss of the medial femorotibial
compartment with focal subchondral reactive marrow changes along the
weight-bearing surface of the medial femoral condyle. High-grade
partial-thickness cartilage loss with areas of full-thickness
cartilage loss of the medial tibial plateau with subchondral
reactive marrow edema.

Lateral:  Mild chondral thinning.

Joint: No significant joint effusion. Normal Hoffa's fat. No plical
thickening.

Popliteal Fossa:  No Baker cyst. Intact popliteus tendon.

Extensor Mechanism: Intact quadriceps tendon. Intact patellar
tendon. Intact medial patellar retinaculum. Intact lateral patellar
retinaculum. Intact MPFL.

Bones: Small focal area of severe subchondral marrow edema along the
periphery of the medial tibial plateau with a subtle subchondral
linear low signal area concerning for a subchondral insufficiency
fracture without depression or displacement. No aggressive osseous
lesion.

Other: No fluid collection or hematoma.
IMPRESSION: 1. Small focal area of severe subchondral marrow edema along the
periphery of the medial tibial plateau with a subtle subchondral
linear low signal area concerning for a subchondral insufficiency
fracture without depression or displacement.
2. Radial tear of the posterior horn of the medial meniscus at the
root. Increased signal in the posterior horn of the medial meniscus
consistent with degeneration.
3. Small horizontal tear of the body of the lateral meniscus
extending to the free edge.
4. Full-thickness cartilage loss of the medial femorotibial
compartment with focal subchondral reactive marrow changes along the
weight-bearing surface of the medial femoral condyle. High-grade
partial-thickness cartilage loss with areas of full-thickness
cartilage loss of the medial tibial plateau with subchondral
reactive marrow edema.

## 2021-05-20 ENCOUNTER — Encounter: Payer: Self-pay | Admitting: *Deleted

## 2021-05-20 NOTE — Congregational Nurse Program (Signed)
?  Dept: [phone] ? ? ?Congregational Nurse Program Note ? ?Date of Encounter: 05/20/2021 ? ?Past Medical History: ?Past Medical History:  ?Diagnosis Date  ? Allergy   ? Aortic atherosclerosis ([place])   ? Arthritis   ? Diabetes mellitus   ? type 2   ? Diverticulosis   ? Dyspepsia   ? Fecal incontinence   ? Gastritis   ? GERD (gastroesophageal reflux disease)   ? 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 Possible related to pyridium  G6PD studies pending  ? Hemorrhoids   ? Hiatal hernia   ? Hyperlipidemia   ? Hyperlipidemia   ? Hypertension   ? Hypothyroidism   ? IBS (irritable bowel syndrome)   ? Iron overload 07/13/2016  ? 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  ? Osteoporosis   ? UTI (lower urinary tract infection)   ? Vitamin D deficiency   ? ? ?Encounter Details: ? CNP Questionnaire - 05/20/21 1400   ? ?  ? Questionnaire  ? Do you give verbal consent to treat you today? Yes   ? Location Patient Served  Not Applicable   home visit  ? Visit Setting Other   lab @ clinic and home visit  ? Patient Status Unknown   ? Insurance Medicare   ? Insurance Referral N/A   ? Medication N/A   ? Medical Provider Yes   ? Screening Referrals N/A   ? Medical Referral N/A   ? Medical Appointment Made N/A   ? Food N/A   ? Transportation N/A   ? Housing/Utilities No permanent housing   ? Interpersonal Safety N/A   ? Intervention Educate;Support;Spiritual Care   ? ED Visit Averted N/A   ? Life-Saving Intervention Made N/A   ? ?  ?  ? ?  ? ? ?Labs for Dr's visit on 05/27/2021 ?I&O cath ordered and help to send bills. ?No acute distress. Spiritual support given. Spent 2 hrs. ? ? ? ?

## 2021-05-26 ENCOUNTER — Telehealth: Payer: Self-pay | Admitting: *Deleted

## 2021-05-26 NOTE — Telephone Encounter (Signed)
Dr. Gwenevere Ghazi nurse called for pt's urine test. ?Will address tomorrow's visit. ?

## 2021-06-04 ENCOUNTER — Telehealth: Payer: Self-pay | Admitting: *Deleted

## 2021-06-04 NOTE — Telephone Encounter (Signed)
Pt stated running node, HA, severe cough.been taking tylenol Q5hrs. Robitussin for cough.  ?Told tylenol Q6 hrs and can alternate with advil Q 8hrs for pain or fever. Also can take benedryl q 6 hrs.  ?Later she call back felt better but remains running nose. And sore throat. Will f/p ?

## 2021-07-24 ENCOUNTER — Encounter: Payer: Self-pay | Admitting: *Deleted

## 2021-07-24 NOTE — Congregational Nurse Program (Signed)
?  Dept: [phone] ? ? ?Congregational Nurse Program Note ? ?Date of Encounter: 07/24/2021 ? ?Past Medical History: ?Past Medical History:  ?Diagnosis Date  ? Allergy   ? Aortic atherosclerosis ([place])   ? Arthritis   ? Diabetes mellitus   ? type 2   ? Diverticulosis   ? Dyspepsia   ? Fecal incontinence   ? Gastritis   ? GERD (gastroesophageal reflux disease)   ? 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 Possible related to pyridium  G6PD studies pending  ? Hemorrhoids   ? Hiatal hernia   ? Hyperlipidemia   ? Hyperlipidemia   ? Hypertension   ? Hypothyroidism   ? IBS (irritable bowel syndrome)   ? Iron overload 07/13/2016  ? 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  ? Osteoporosis   ? UTI (lower urinary tract infection)   ? Vitamin D deficiency   ? ? ?Encounter Details: ? CNP Questionnaire - 07/24/21 1138   ? ?  ? Questionnaire  ? Do you give verbal consent to treat you today? Yes   ? Location Patient Served  Not Applicable   Dr. Keane Sc[facility] clinic  ? Visit Setting MD Office   ? Patient Status Unknown   ? Insurance Medicare   ? Insurance Referral N/A   ? Medication N/A   ? Medical Provider Yes   ? Screening Referrals N/A   ? Medical Referral N/A   ? Medical Appointment Made Other   f/u with Dr.Pace @ Nov.15 9am  ? Food N/A   ? Transportation Provided transportation assistance   ? Housing/Utilities No permanent housing   ? Interpersonal Safety N/A   ? Intervention Educate;Support;Blood pressure;Blood glucose   ? ED Visit Averted N/A   ? Life-Saving Intervention Made N/A   ? ?  ?  ? ?  ? ?visited Dr. Claudia Desanctis . Next f/u in 6 month, made appointment @ 01/28/2024 9Am. ?Unless having symptoms, will not treat UTI with antibiotic due to bacteria colonized. Cont. To I&O cath 6x/day ? Estrogen cream 2x/week at night time to prevent UTI. ?Have D-mannose. hyperbioticsPR supplement to prevnt UTI. Ordered via [facility] Block will arrive this weekend to pt's home. Notified pt to pick up estrogen from [facility]- son will pick it up this afternoon. Ride  provided. ? ? ?

## 2021-07-31 ENCOUNTER — Telehealth: Payer: Self-pay | Admitting: *Deleted

## 2021-07-31 NOTE — Telephone Encounter (Signed)
I &O cath order .

## 2021-08-15 IMAGING — US US EXTREM LOW VENOUS*L*
1 series · 13 of 24 positions shown · non-contrast
Comparison: None.

CLINICAL DATA: Left lower extremity pain.  Evaluate for DVT.



[Series 1: us extrem low venous*left* · 0.07mm/px · 13 of 29 slices shown]
[im 1/29]
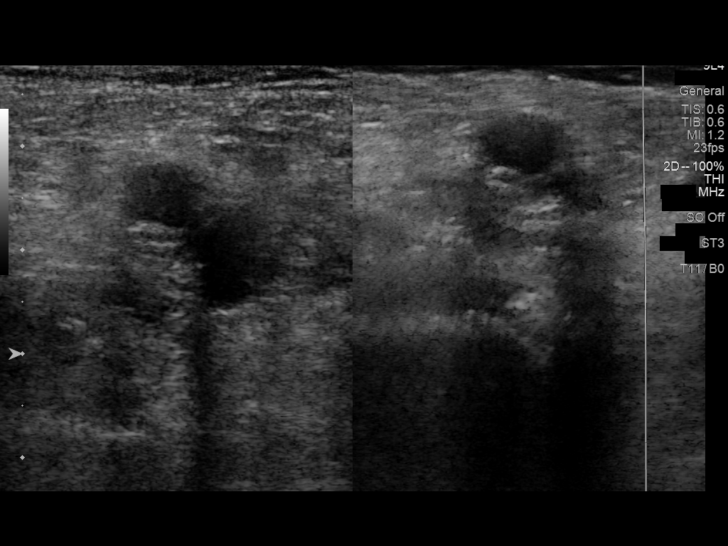
[im 3/29]
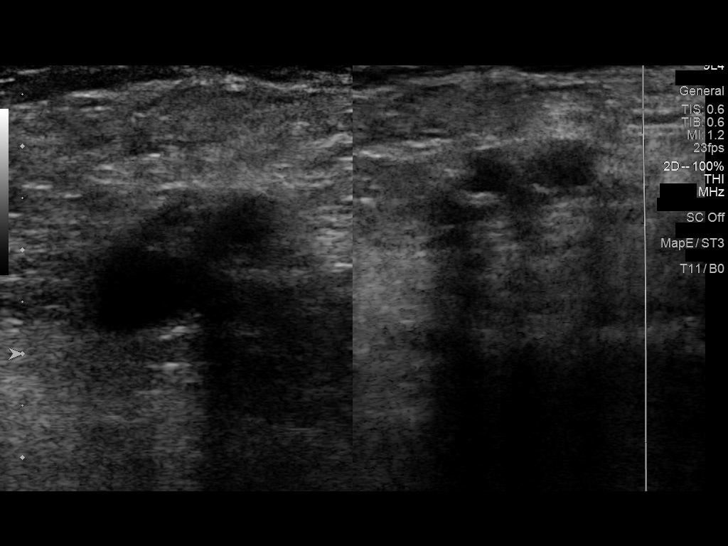
[im 5/29]
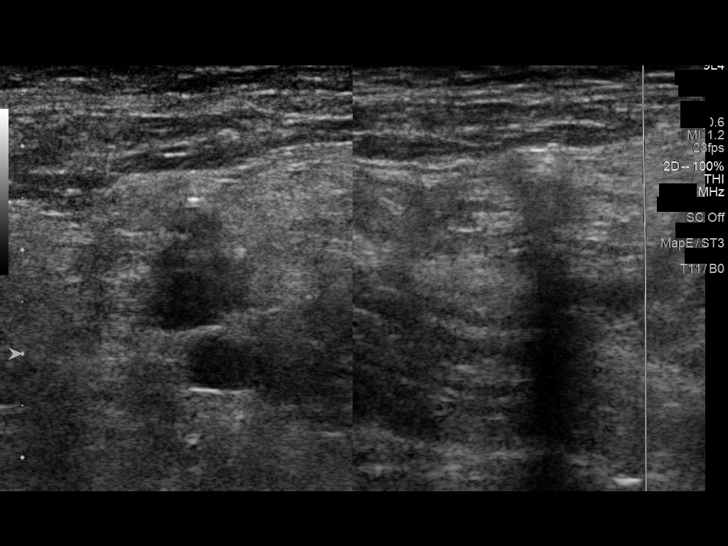
[im 8/29]
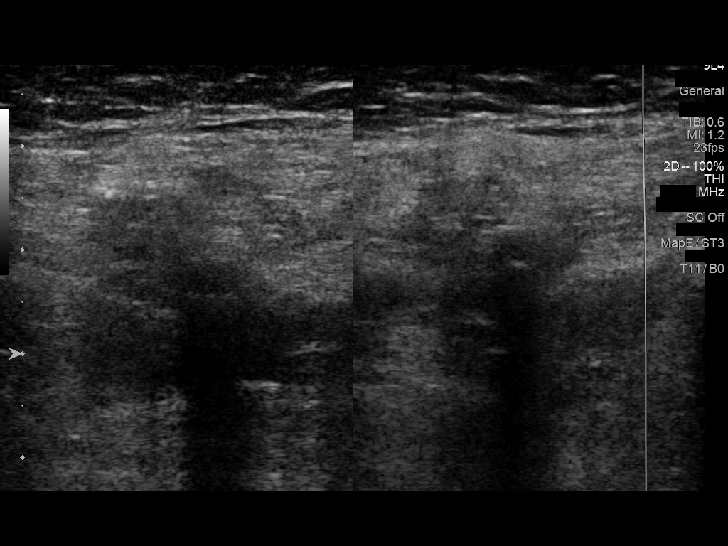
[im 10/29]
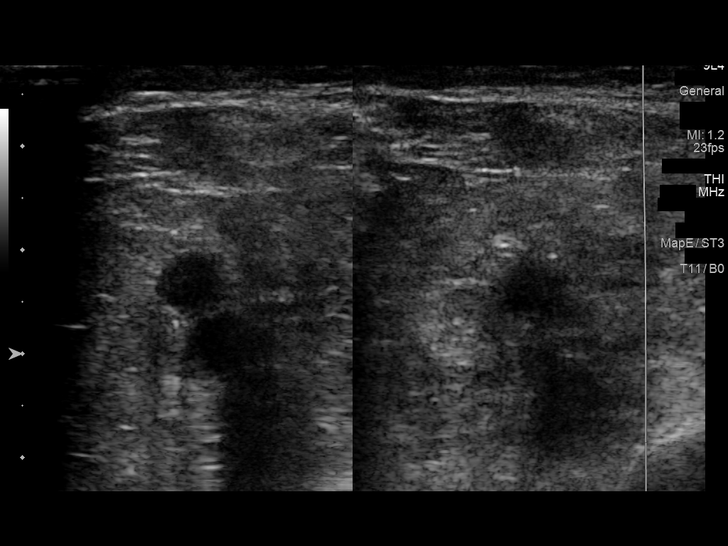
[im 13/29]
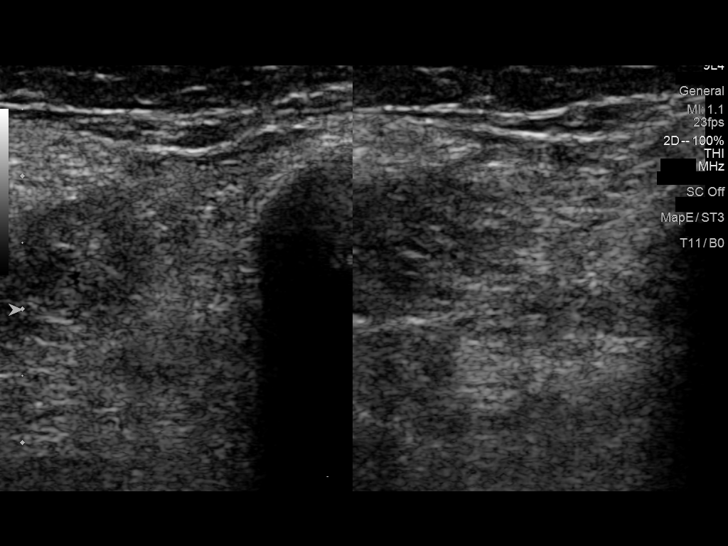
[im 15/29]
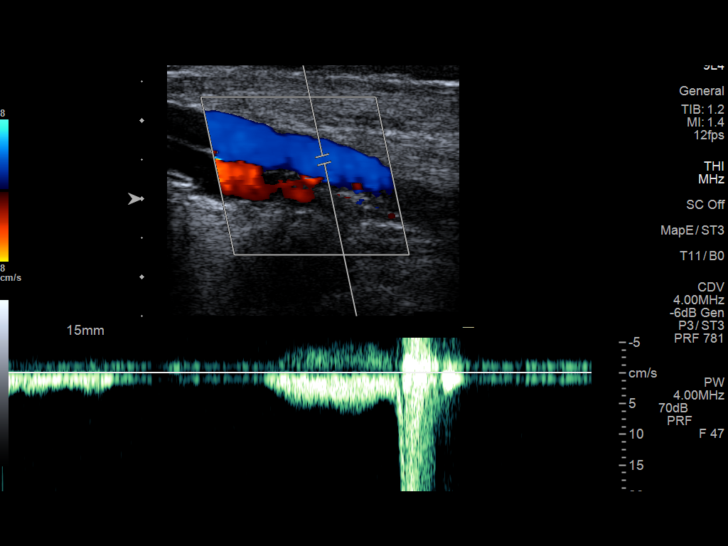
[im 16/29]
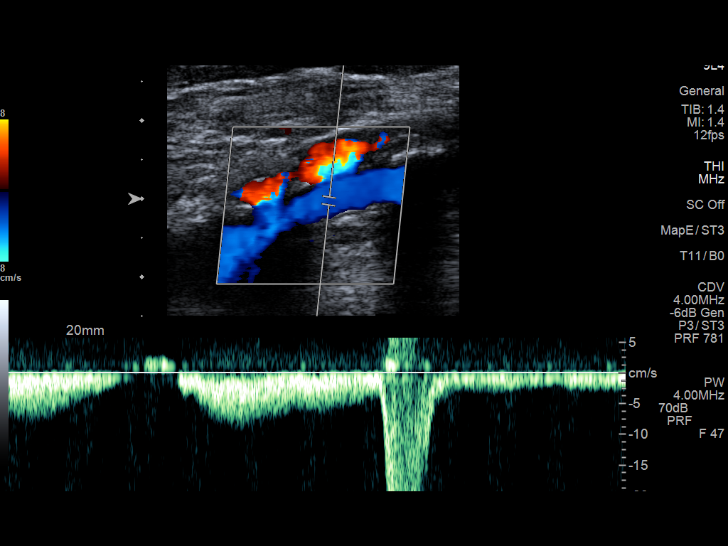
[im 19/29]
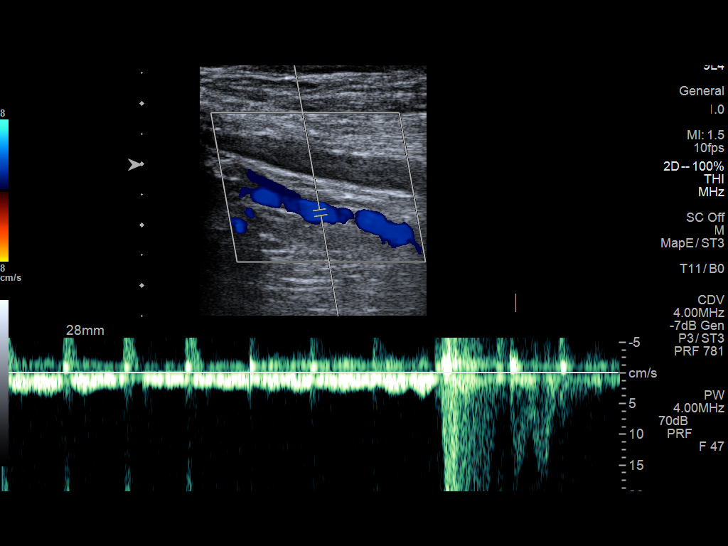
[im 21/29]
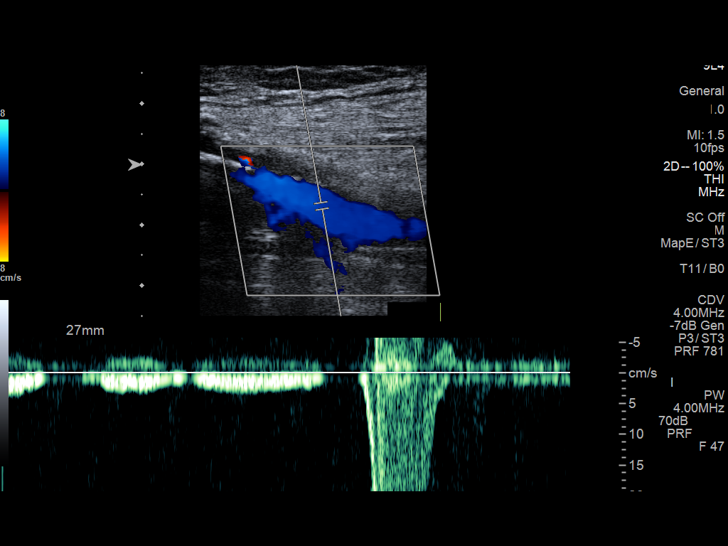
[im 24/29]
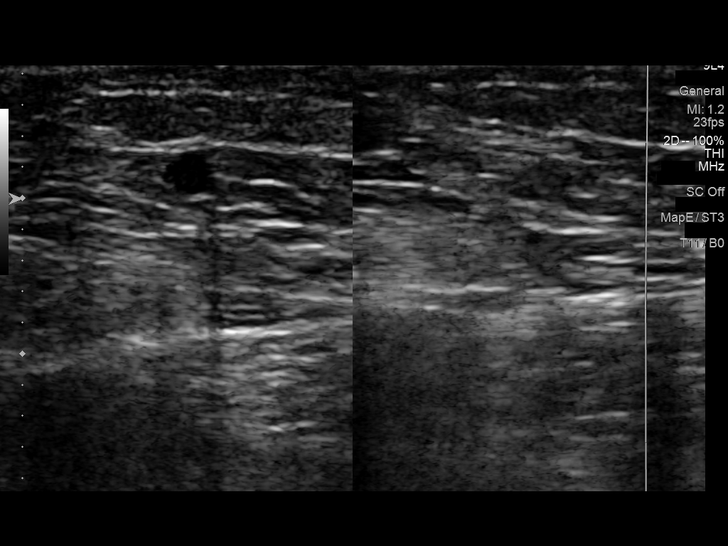
[im 26/29]
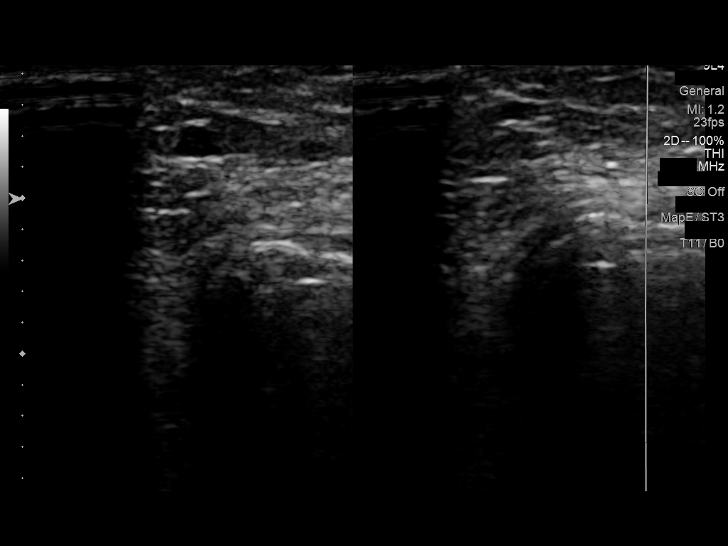
[im 29/29]
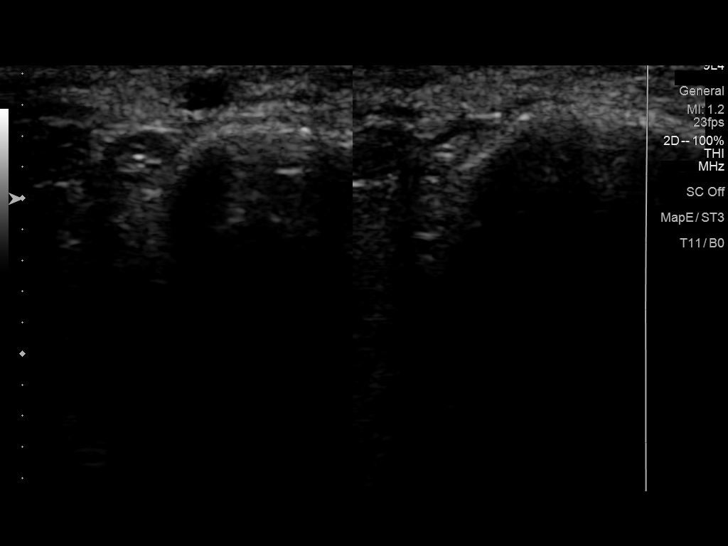

[13 of 24 positions shown; findings below may reference images not displayed]

FINDINGS: Contralateral Common Femoral Vein: Respiratory phasicity is normal
and symmetric with the symptomatic side. No evidence of thrombus.
Normal compressibility.

Common Femoral Vein: No evidence of thrombus. Normal
compressibility, respiratory phasicity and response to augmentation.

Saphenofemoral Junction: No evidence of thrombus. Normal
compressibility and flow on color Doppler imaging.

Profunda Femoral Vein: No evidence of thrombus. Normal
compressibility and flow on color Doppler imaging.

Femoral Vein: No evidence of thrombus. Normal compressibility,
respiratory phasicity and response to augmentation.

Popliteal Vein: No evidence of thrombus. Normal compressibility,
respiratory phasicity and response to augmentation.

Calf Veins: No evidence of thrombus. Normal compressibility and flow
on color Doppler imaging.

Superficial Great Saphenous Vein: No evidence of thrombus. Normal
compressibility.

Venous Reflux:  None.

Other Findings:  None.
IMPRESSION: No evidence of DVT within the left lower extremity.

## 2021-08-21 ENCOUNTER — Encounter: Payer: Self-pay | Admitting: *Deleted

## 2021-08-21 NOTE — Congregational Nurse Program (Signed)
Pt missed Sunday last week due to cold sx. Home visit, check bills, mails. Cold sx is getting better, occasional dry cough only. Will f/u

## 2021-09-04 ENCOUNTER — Telehealth: Payer: Self-pay | Admitting: *Deleted

## 2021-09-04 DIAGNOSIS — R339 Retention of urine, unspecified: Secondary | ICD-10-CM | POA: Diagnosis not present

## 2021-09-10 ENCOUNTER — Telehealth: Payer: Self-pay | Admitting: *Deleted

## 2021-09-10 NOTE — Telephone Encounter (Signed)
Received text message from pt"s pharmacy that need refill orders. Sent request vis patient portal to Dr.Tate, PCP.

## 2021-09-10 NOTE — Telephone Encounter (Signed)
Pt notified  that she received I&O cath.

## 2021-09-15 ENCOUNTER — Telehealth: Payer: Self-pay | Admitting: *Deleted

## 2021-09-15 ENCOUNTER — Encounter (HOSPITAL_COMMUNITY): Payer: Self-pay

## 2021-09-15 ENCOUNTER — Ambulatory Visit (HOSPITAL_COMMUNITY)
Admission: EM | Admit: 2021-09-15 | Discharge: 2021-09-15 | Disposition: A | Discharge status: Home / Self Care | Payer: Medicare Other | Attending: Emergency Medicine | Admitting: Emergency Medicine

## 2021-09-15 DIAGNOSIS — G44201 Tension-type headache, unspecified, intractable: Secondary | ICD-10-CM

## 2021-09-15 DIAGNOSIS — R519 Headache, unspecified: Secondary | ICD-10-CM

## 2021-09-15 MED ORDER — ONDANSETRON 4 MG PO TBDP
4.0000 mg | ORAL_TABLET | Freq: Once | ORAL | Status: AC
  Administered 2021-09-15: 4 mg via ORAL

## 2021-09-15 MED ORDER — SUMATRIPTAN SUCCINATE 6 MG/0.5ML ~~LOC~~ SOLN
SUBCUTANEOUS | Status: AC
  Filled 2021-09-15: qty 0.5

## 2021-09-15 MED ORDER — ONDANSETRON 4 MG PO TBDP
4.0000 mg | ORAL_TABLET | Freq: Three times a day (TID) | ORAL | 0 refills | Status: DC | PRN
Start: 2021-09-15 — End: 2022-03-29

## 2021-09-15 MED ORDER — ONDANSETRON 4 MG PO TBDP
ORAL_TABLET | ORAL | Status: AC
  Filled 2021-09-15: qty 1

## 2021-09-15 MED ORDER — SUMATRIPTAN SUCCINATE 6 MG/0.5ML ~~LOC~~ SOLN
6.0000 mg | Freq: Once | SUBCUTANEOUS | Status: AC
Start: 2021-09-15 — End: 2021-09-15
  Administered 2021-09-15: 6 mg via SUBCUTANEOUS

## 2021-09-15 MED ORDER — DEXAMETHASONE SODIUM PHOSPHATE 10 MG/ML IJ SOLN
INTRAMUSCULAR | Status: AC
  Filled 2021-09-15: qty 1

## 2021-09-15 MED ORDER — DEXAMETHASONE SODIUM PHOSPHATE 10 MG/ML IJ SOLN
10.0000 mg | Freq: Once | INTRAMUSCULAR | Status: AC
  Administered 2021-09-15: 10 mg via INTRAMUSCULAR

## 2021-09-15 NOTE — ED Triage Notes (Signed)
Pt states headache and dizziness for the past 2 days.  States she vomited last night. Took Motrin with no relief.

## 2021-09-15 NOTE — ED Provider Notes (Signed)
[facility]    CSN: [id number] Arrival date & time: 09/15/21  1030      History   Chief Complaint Chief Complaint  Patient presents with   Headache    HPI Lauren [name] is a 81 y.o. female.   Patient presents with a constant generalized headache for 2 days.  Described as aching and squeezing sensation, rating it 9 out of 10.  Associated dizziness described as feeling unbalanced, photophobia, phonophobia and nausea without vomiting.  Attempted to go on a walk which temporarily improved symptoms, attempted use of ibuprofen and Advil which was ineffective.  Denies memory or speech changes, general weakness, lightheadedness, syncope, visual disturbance, fever, chills, URI symptoms.     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   Possible related to pyridium  G6PD studies pending   Hemorrhoids    Hiatal hernia    Hyperlipidemia    Hyperlipidemia    Hypertension    Hypothyroidism    IBS (irritable bowel syndrome)    Iron overload 07/13/2016   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   Osteoporosis    UTI (lower urinary tract infection)    Vitamin D deficiency     Patient Active Problem List   Diagnosis Date Noted   OA (osteoarthritis) of knee 02/25/2020   Primary osteoarthritis of left knee 02/25/2020   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   Iron overload 07/13/2016   Knee pain, bilateral 07/06/2016   Upset stomach 05/18/2016   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   Ear ringing 02/23/2016   Fecal incontinence 01/03/2015   Nausea without vomiting 75/64/3329   Periumbilical abdominal pain 01/03/2015   HTN (hypertension) 01/25/2011   Hyperlipidemia 01/25/2011   Diabetes mellitus 01/25/2011   GERD  (gastroesophageal reflux disease) 01/25/2011   Osteoporosis 01/25/2011   Hypothyroidism 01/25/2011    Past Surgical History:  Procedure Laterality Date   COLONOSCOPY     PARTIAL KNEE ARTHROPLASTY Left 02/25/2020   Procedure: UNICOMPARTMENTAL KNEE;  Surgeon: Gaynelle Arabian, MD;  Location: [facility];  Service: Orthopedics;  Laterality: Left;  4mn    OB History     Gravida  5   Para  3   Term      Preterm      AB  2   Living  3      SAB      IAB  2   Ectopic      Multiple      Live Births               Home Medications    Prior to Admission medications   Medication Sig Start Date End Date Taking? Authorizing Provider  ascorbic acid (VITAMIN C) 500 MG tablet Take 500 mg by mouth daily.    [provider]  cefdinir (OMNICEF) 300 MG capsule Take 1 capsule (300 mg total) by mouth 2 (two) times daily. 11/21/20   RQuintella Reichert MD  Cholecalciferol (VITAMIN D3) 2000 UNITS TABS Take 1 tablet by mouth daily.    [provider]  COVID-19 mRNA Vac-TriS, Pfizer, SUSP injection Inject into the muscle. 09/23/20   SCarlyle Basques MD  glucosamine-chondroitin 500-400 MG tablet Take 1 tablet by mouth 3 (three) times daily.    [provider]  levothyroxine (SYNTHROID, LEVOTHROID) 75 MCG tablet Take 75 mcg by mouth daily.    [provider]  losartan (COZAAR) 100 MG tablet Take 100 mg by mouth daily.    [provider]  methocarbamol (ROBAXIN) 500 MG tablet Take 1 tablet (500 mg total) by mouth every 6 (six) hours as needed for muscle spasms. 02/26/20   Edmisten, Ok Anis, PA  Multiple Vitamin (MULTIVITAMIN) tablet Take 1 tablet by mouth daily.    [provider]  Omega-3 Fatty Acids (FISH OIL) 500 MG CAPS Take 500 mg by mouth 2 (two) times daily.    [provider]  omeprazole (PRILOSEC) 40 MG capsule Take 1 capsule (40 mg total) by mouth daily. 03/12/15   Pyrtle, Lajuan Lines, MD  ondansetron (ZOFRAN ODT) 4 MG  disintegrating tablet Take 1 tablet (4 mg total) by mouth every 8 (eight) hours as needed for nausea or vomiting. 11/21/20   Quintella Reichert, MD  oxyCODONE (OXY IR/ROXICODONE) 5 MG immediate release tablet Take 1-2 tablets (5-10 mg total) by mouth every 6 (six) hours as needed for severe pain. 02/26/20   Edmisten, Kristie L, PA  pravastatin (PRAVACHOL) 40 MG tablet Take 40 mg by mouth daily. 01/12/20   [provider]  sitaGLIPtan-metformin (JANUMET) 50-500 MG per tablet Take 1 tablet by mouth 2 (two) times daily with a meal.    [provider]  vitamin B-12 (CYANOCOBALAMIN) 100 MCG tablet Take 100 mcg by mouth daily.    [provider]    Family History Family History  Problem Relation Age of Onset   Colon cancer Neg Hx     Social History Social History   Tobacco Use   Smoking status: Never   Smokeless tobacco: Never  Substance Use Topics   Alcohol use: No   Drug use: No     Allergies   Pyridium [phenazopyridine hcl], Sulfa antibiotics, Ciprofloxacin hcl, Esomeprazole magnesium, Macrodantin [nitrofurantoin macrocrystal], Pantoprazole sodium, and Tramadol   Review of Systems Review of Systems  Constitutional: Negative.   HENT: Negative.    Respiratory: Negative.    Cardiovascular: Negative.   Gastrointestinal: Negative.   Musculoskeletal: Negative.   Skin: Negative.   Neurological:  Positive for dizziness and headaches. Negative for tremors, seizures, syncope, facial asymmetry, speech difficulty, weakness, light-headedness and numbness.     Physical Exam Triage Vital Signs ED Triage Vitals  Enc Vitals Group     BP 09/15/21 1056 (!) 162/76     Pulse Rate 09/15/21 1056 72     Resp 09/15/21 1056 16     Temp 09/15/21 1056 (!) 97.4 F (36.3 C)     Temp Source 09/15/21 1056 Oral     SpO2 09/15/21 1056 98 %     Weight --      Height --      Head Circumference --      Peak Flow --      Pain Score 09/15/21 1057 9     Pain Loc --      Pain  Edu? --      Excl. in [place]? --    No data found.  Updated Vital Signs BP (!) 162/76 (BP Location: Left Arm)   Pulse 72   Temp (!) 97.4 F (36.3 C) (Oral)   Resp 16   SpO2 98%   Visual Acuity Right Eye Distance:   Left Eye Distance:  Bilateral Distance:    Right Eye Near:   Left Eye Near:    Bilateral Near:     Physical Exam Constitutional:      Appearance: Normal appearance. She is well-developed.  HENT:     Head: Normocephalic.  Eyes:     Extraocular Movements: Extraocular movements intact.  Pulmonary:     Effort: Pulmonary effort is normal.  Skin:    General: Skin is warm and dry.  Neurological:     General: No focal deficit present.     Mental Status: She is alert and oriented to person, place, and time. Mental status is at baseline.     Cranial Nerves: No cranial nerve deficit.     Sensory: No sensory deficit.     Motor: No weakness.     Gait: Gait normal.  Psychiatric:        Mood and Affect: Mood normal.        Behavior: Behavior normal.      UC Treatments / Results  Labs (all labs ordered are listed, but only abnormal results are displayed) Labs Reviewed - No data to display  EKG   Radiology No results found.  Procedures Procedures (including critical care time)  Medications Ordered in UC Medications - No data to display  Initial Impression / Assessment and Plan / UC Course  I have reviewed the triage vital signs and the nursing notes.  Pertinent labs & imaging results that were available during my care of the patient were reviewed by me and considered in my medical decision making (see chart for details).  Acute intractable tension type headache Bad headache  Vital signs are stable and patient is in no signs of distress nor toxic appearing, no abnormalities noted to the neurological exam, Imitrex, Decadron and oral Zofran given in office, may continue use of over-the-counter analgesics once home, advised low stimulation activities,  adequate rest and hydration for additional support, prescribe Zofran for persisting nausea, given strict precautions for headache worsening in intensity and severity to go to the nearest emergency department for further evaluation and management Final Clinical Impressions(s) / UC Diagnoses   Final diagnoses:  None   Discharge Instructions   None    ED Prescriptions   None    PDMP not reviewed this encounter.   Hans Eden, NP 09/15/21 1134

## 2021-09-15 NOTE — Discharge Instructions (Signed)
On exam there are no abnormalities neurologically, at this time I believe there is not any more serious involvement of the brain that is causing your headache  therefore we can move forward with treatment of your headache  You have been given an injection of Decadron which is a steroid, Imitrex which is a medication for migraines and Zofran tablet to help with nausea in the office which all will help to reduce your symptoms  At home you may continue use of ibuprofen, may take 800 mg (4 tablets)every 8 hours, if you are using Aleve, may take 440 mg (2 tablets) every 12 hours, if using Tylenol you may take 500 to 1000 mg every 6 hours for management of your headache  Ensure that you are getting good fluid intake through use of water  Ensure that you are getting good rest  Practice low stimulation activities limiting loud noises and bright lights when headache at present  At any point if you begin to experience the worst headache ever please go to the nearest emergency department

## 2021-09-15 NOTE — Telephone Encounter (Signed)
Other church member took pt to urgent care for HA. Get 2 injection and went home. Will f/u

## 2021-09-16 ENCOUNTER — Telehealth: Payer: Self-pay | Admitting: *Deleted

## 2021-09-16 NOTE — Telephone Encounter (Signed)
Pt said rx med picked up this am, took it under the tongue. Also exercise for vertigo for dizziness. Sx were on & off.

## 2021-09-20 ENCOUNTER — Encounter: Payer: Self-pay | Admitting: *Deleted

## 2021-09-20 NOTE — Congregational Nurse Program (Signed)
Pt visited cn office due to remain dizziness and HA. VS; 137/78 hr 86, cbg 152 no fasting. Advised  that her son need to call Dr. Theda Sers to make an appointment or ask walk in visit. Also she changed to medicaid recently, when make appointment ,let them know her medicaid status. This nurse tried let her son involve pt's care from now on. Ibuprofen and tylenol for pain . Alternate if not working either meds.

## 2021-09-29 ENCOUNTER — Telehealth: Payer: Self-pay | Admitting: *Deleted

## 2021-09-29 DIAGNOSIS — R42 Dizziness and giddiness: Secondary | ICD-10-CM | POA: Diagnosis not present

## 2021-09-29 DIAGNOSIS — R339 Retention of urine, unspecified: Secondary | ICD-10-CM | POA: Diagnosis not present

## 2021-09-29 DIAGNOSIS — I10 Essential (primary) hypertension: Secondary | ICD-10-CM | POA: Diagnosis not present

## 2021-09-29 NOTE — Telephone Encounter (Signed)
Pt will vs Dr's office with church members for HA/ dizziness. Notified client for I&O cath will arrive in 2 -3 days for supplies.

## 2021-10-01 ENCOUNTER — Telehealth: Payer: Self-pay | Admitting: *Deleted

## 2021-10-01 NOTE — Telephone Encounter (Signed)
Physical theray from [facility] called for Katie PA 's referral for vertigo. Made appointment and notified to pt. No medicaid accept per PT.

## 2021-10-04 ENCOUNTER — Encounter: Payer: Self-pay | Admitting: *Deleted

## 2021-10-04 ENCOUNTER — Other Ambulatory Visit: Payer: Self-pay | Admitting: *Deleted

## 2021-10-04 NOTE — Congregational Nurse Program (Signed)
Dizziness better with medicine, take Q12hrs per pt.reminded her PT appointment.on 10/15/2021 @ 1300

## 2021-10-15 ENCOUNTER — Encounter: Payer: Self-pay | Admitting: *Deleted

## 2021-10-15 DIAGNOSIS — R2681 Unsteadiness on feet: Secondary | ICD-10-CM | POA: Diagnosis not present

## 2021-10-15 DIAGNOSIS — R42 Dizziness and giddiness: Secondary | ICD-10-CM | POA: Diagnosis not present

## 2021-10-15 NOTE — Congregational Nurse Program (Signed)
Pt had physical Tx appointment @ 1245. Been picked pt and dropped to her home, staying whole session of treatment and educated  with interprete due to language barrier. Need f/u 3-4 times per week. Need find take and interprete this pt. Dizzy whole time with light headed.  Trained visual exercise 30sec to 2 min, 3-4 times per day with standing,  moving finger and head exercise 30 sec. To 2 min. With sitting position 12 x/perday. Next appointment is 10/21/21 @ 9am

## 2021-10-15 NOTE — Congregational Nurse Program (Unsigned)
Spent 5hrs. Today's schedule with physical Tx.

## 2021-10-16 ENCOUNTER — Telehealth: Payer: Self-pay | Admitting: *Deleted

## 2021-10-16 NOTE — Telephone Encounter (Signed)
Found person who is taking pt to therapy appointment  on 10/21/2021 .

## 2021-10-18 ENCOUNTER — Encounter: Payer: Self-pay | Admitting: *Deleted

## 2021-10-18 NOTE — Congregational Nurse Program (Signed)
Out of med for GERD.  Ordered vis online. Check e-mail what she brought. Pt said doing exercize for vertigo, getting better. Next appointment is 8/6 '@9am'$   arranged with congregation member will take her to PT .

## 2021-10-21 ENCOUNTER — Telehealth: Payer: Self-pay | Admitting: *Deleted

## 2021-10-21 DIAGNOSIS — R2681 Unsteadiness on feet: Secondary | ICD-10-CM | POA: Diagnosis not present

## 2021-10-21 DIAGNOSIS — R42 Dizziness and giddiness: Secondary | ICD-10-CM | POA: Diagnosis not present

## 2021-10-21 NOTE — Telephone Encounter (Signed)
Reminded pt. And care giver that pt appointment on 10/21/2021 9am

## 2021-10-22 DIAGNOSIS — R339 Retention of urine, unspecified: Secondary | ICD-10-CM | POA: Diagnosis not present

## 2021-10-25 ENCOUNTER — Telehealth: Payer: Self-pay | Admitting: *Deleted

## 2021-10-25 NOTE — Telephone Encounter (Signed)
Ordered medication via online and called to [facility] for [facility]O cath last week and received them per pt.

## 2021-10-26 ENCOUNTER — Encounter: Payer: Self-pay | Admitting: *Deleted

## 2021-10-26 NOTE — Congregational Nurse Program (Signed)
Home visit for checking mails and exercise for vertigo. Reminded pt that appointment 0n 10/28/2021 PT.

## 2021-10-28 ENCOUNTER — Telehealth: Payer: Self-pay | Admitting: *Deleted

## 2021-10-28 NOTE — Telephone Encounter (Signed)
Cancel PT appointment today due to person who translate and give ride the pt. Is sick.notified PT office left message and notified Pt. And pt. Agreed.

## 2021-11-01 ENCOUNTER — Encounter: Payer: Self-pay | Admitting: *Deleted

## 2021-11-01 NOTE — Congregational Nurse Program (Cosign Needed)
Check BP and mail check. Reminded that appointment on 11/04/21 PT.

## 2021-11-04 ENCOUNTER — Encounter: Payer: Self-pay | Admitting: *Deleted

## 2021-11-04 DIAGNOSIS — R2681 Unsteadiness on feet: Secondary | ICD-10-CM | POA: Diagnosis not present

## 2021-11-04 DIAGNOSIS — R42 Dizziness and giddiness: Secondary | ICD-10-CM | POA: Diagnosis not present

## 2021-11-04 NOTE — Congregational Nurse Program (Signed)
Gave ride and interpreted during PT session. Will open whenever need, make an appointment.  Pt remains light headed and dull HA. And dizziness that any better or any worse. PT recommended to see Dr. And check up head to find the reason of HA and dizziness. Pt has appointment  with PCP on Sept. Spend 3 hrs today.

## 2021-11-13 DIAGNOSIS — R339 Retention of urine, unspecified: Secondary | ICD-10-CM | POA: Diagnosis not present

## 2021-11-24 DIAGNOSIS — E1165 Type 2 diabetes mellitus with hyperglycemia: Secondary | ICD-10-CM | POA: Diagnosis not present

## 2021-11-24 DIAGNOSIS — E039 Hypothyroidism, unspecified: Secondary | ICD-10-CM | POA: Diagnosis not present

## 2021-11-24 DIAGNOSIS — E7800 Pure hypercholesterolemia, unspecified: Secondary | ICD-10-CM | POA: Diagnosis not present

## 2021-11-24 DIAGNOSIS — N182 Chronic kidney disease, stage 2 (mild): Secondary | ICD-10-CM | POA: Diagnosis not present

## 2021-11-24 DIAGNOSIS — N390 Urinary tract infection, site not specified: Secondary | ICD-10-CM | POA: Diagnosis not present

## 2021-12-01 DIAGNOSIS — E039 Hypothyroidism, unspecified: Secondary | ICD-10-CM | POA: Diagnosis not present

## 2021-12-01 DIAGNOSIS — N390 Urinary tract infection, site not specified: Secondary | ICD-10-CM | POA: Diagnosis not present

## 2021-12-01 DIAGNOSIS — R413 Other amnesia: Secondary | ICD-10-CM | POA: Diagnosis not present

## 2021-12-01 DIAGNOSIS — I10 Essential (primary) hypertension: Secondary | ICD-10-CM | POA: Diagnosis not present

## 2021-12-01 DIAGNOSIS — E1165 Type 2 diabetes mellitus with hyperglycemia: Secondary | ICD-10-CM | POA: Diagnosis not present

## 2021-12-01 DIAGNOSIS — E7800 Pure hypercholesterolemia, unspecified: Secondary | ICD-10-CM | POA: Diagnosis not present

## 2021-12-01 DIAGNOSIS — N182 Chronic kidney disease, stage 2 (mild): Secondary | ICD-10-CM | POA: Diagnosis not present

## 2021-12-01 DIAGNOSIS — Z23 Encounter for immunization: Secondary | ICD-10-CM | POA: Diagnosis not present

## 2021-12-15 DIAGNOSIS — R339 Retention of urine, unspecified: Secondary | ICD-10-CM | POA: Diagnosis not present

## 2022-01-20 ENCOUNTER — Other Ambulatory Visit (HOSPITAL_COMMUNITY): Payer: Self-pay

## 2022-01-20 MED ORDER — ACCU-CHEK AVIVA PLUS VI STRP
ORAL_STRIP | 5 refills | Status: AC
  Filled 2022-01-20: qty 100, 33d supply, fill #0

## 2022-01-21 ENCOUNTER — Other Ambulatory Visit (HOSPITAL_COMMUNITY): Payer: Self-pay

## 2022-01-27 DIAGNOSIS — R8271 Bacteriuria: Secondary | ICD-10-CM | POA: Diagnosis not present

## 2022-01-27 DIAGNOSIS — N3020 Other chronic cystitis without hematuria: Secondary | ICD-10-CM | POA: Diagnosis not present

## 2022-01-27 DIAGNOSIS — R338 Other retention of urine: Secondary | ICD-10-CM | POA: Diagnosis not present

## 2022-01-27 DIAGNOSIS — R3914 Feeling of incomplete bladder emptying: Secondary | ICD-10-CM | POA: Diagnosis not present

## 2022-02-11 DIAGNOSIS — R339 Retention of urine, unspecified: Secondary | ICD-10-CM | POA: Diagnosis not present

## 2022-03-04 DIAGNOSIS — R339 Retention of urine, unspecified: Secondary | ICD-10-CM | POA: Diagnosis not present

## 2022-03-11 ENCOUNTER — Encounter (HOSPITAL_BASED_OUTPATIENT_CLINIC_OR_DEPARTMENT_OTHER): Payer: Self-pay | Admitting: Emergency Medicine

## 2022-03-11 ENCOUNTER — Other Ambulatory Visit: Payer: Self-pay

## 2022-03-11 DIAGNOSIS — Z20822 Contact with and (suspected) exposure to covid-19: Secondary | ICD-10-CM | POA: Diagnosis not present

## 2022-03-11 DIAGNOSIS — D649 Anemia, unspecified: Secondary | ICD-10-CM | POA: Diagnosis not present

## 2022-03-11 DIAGNOSIS — E871 Hypo-osmolality and hyponatremia: Secondary | ICD-10-CM | POA: Insufficient documentation

## 2022-03-11 DIAGNOSIS — R42 Dizziness and giddiness: Secondary | ICD-10-CM | POA: Diagnosis not present

## 2022-03-11 DIAGNOSIS — Z79899 Other long term (current) drug therapy: Secondary | ICD-10-CM | POA: Insufficient documentation

## 2022-03-11 DIAGNOSIS — R519 Headache, unspecified: Secondary | ICD-10-CM | POA: Insufficient documentation

## 2022-03-11 DIAGNOSIS — R630 Anorexia: Secondary | ICD-10-CM | POA: Diagnosis not present

## 2022-03-11 DIAGNOSIS — I10 Essential (primary) hypertension: Secondary | ICD-10-CM | POA: Diagnosis not present

## 2022-03-11 DIAGNOSIS — E119 Type 2 diabetes mellitus without complications: Secondary | ICD-10-CM | POA: Diagnosis not present

## 2022-03-11 DIAGNOSIS — Z1152 Encounter for screening for COVID-19: Secondary | ICD-10-CM | POA: Insufficient documentation

## 2022-03-11 DIAGNOSIS — Z7984 Long term (current) use of oral hypoglycemic drugs: Secondary | ICD-10-CM | POA: Insufficient documentation

## 2022-03-11 LAB — CBC WITH DIFFERENTIAL/PLATELET
Abs Immature Granulocytes: 0.02 10*3/uL (ref 0.00–0.07)
Basophils Absolute: 0 10*3/uL (ref 0.0–0.1)
Basophils Relative: 1 %
Eosinophils Absolute: 0.2 10*3/uL (ref 0.0–0.5)
Eosinophils Relative: 3 %
HCT: 31 % — ABNORMAL LOW (ref 36.0–46.0)
Hemoglobin: 10.8 g/dL — ABNORMAL LOW (ref 12.0–15.0)
Immature Granulocytes: 0 %
Lymphocytes Relative: 37 %
Lymphs Abs: 2.1 10*3/uL (ref 0.7–4.0)
MCH: 30.9 pg (ref 26.0–34.0)
MCHC: 34.8 g/dL (ref 30.0–36.0)
MCV: 88.8 fL (ref 80.0–100.0)
Monocytes Absolute: 0.6 10*3/uL (ref 0.1–1.0)
Monocytes Relative: 10 %
Neutro Abs: 2.8 10*3/uL (ref 1.7–7.7)
Neutrophils Relative %: 49 %
Platelets: 180 10*3/uL (ref 150–400)
RBC: 3.49 MIL/uL — ABNORMAL LOW (ref 3.87–5.11)
RDW: 13 % (ref 11.5–15.5)
WBC: 5.6 10*3/uL (ref 4.0–10.5)
nRBC: 0 % (ref 0.0–0.2)

## 2022-03-11 LAB — RESP PANEL BY RT-PCR (RSV, FLU A&B, COVID)  RVPGX2
Influenza A by PCR: NEGATIVE
Influenza B by PCR: NEGATIVE
Resp Syncytial Virus by PCR: NEGATIVE
SARS Coronavirus 2 by RT PCR: NEGATIVE

## 2022-03-11 LAB — COMPREHENSIVE METABOLIC PANEL
ALT: 28 U/L (ref 0–44)
AST: 20 U/L (ref 15–41)
Albumin: 3.3 g/dL — ABNORMAL LOW (ref 3.5–5.0)
Alkaline Phosphatase: 63 U/L (ref 38–126)
Anion gap: 9 (ref 5–15)
BUN: 10 mg/dL (ref 8–23)
CO2: 22 mmol/L (ref 22–32)
Calcium: 8.6 mg/dL — ABNORMAL LOW (ref 8.9–10.3)
Chloride: 97 mmol/L — ABNORMAL LOW (ref 98–111)
Creatinine, Ser: 0.52 mg/dL (ref 0.44–1.00)
GFR, Estimated: >60 mL/min (ref >60)
Glucose, Bld: 131 mg/dL — ABNORMAL HIGH (ref 70–99)
Potassium: 3.5 mmol/L (ref 3.5–5.1)
Sodium: 128 mmol/L — ABNORMAL LOW (ref 135–145)
Total Bilirubin: 0.2 mg/dL — ABNORMAL LOW (ref 0.3–1.2)
Total Protein: 6.1 g/dL — ABNORMAL LOW (ref 6.5–8.1)

## 2022-03-11 LAB — TROPONIN I (HIGH SENSITIVITY): Troponin I (High Sensitivity): 5 ng/L (ref <18)

## 2022-03-11 NOTE — ED Triage Notes (Signed)
Patient sent from urgent care for further eval. C/o headache x3 days with dizziness and decreased appetite.

## 2022-03-12 ENCOUNTER — Emergency Department (HOSPITAL_BASED_OUTPATIENT_CLINIC_OR_DEPARTMENT_OTHER): Payer: Medicare Other

## 2022-03-12 ENCOUNTER — Emergency Department (HOSPITAL_BASED_OUTPATIENT_CLINIC_OR_DEPARTMENT_OTHER)
Admission: EM | Admit: 2022-03-12 | Discharge: 2022-03-12 | Disposition: A | Discharge status: Home / Self Care | Payer: Medicare Other | Attending: Emergency Medicine | Admitting: Emergency Medicine

## 2022-03-12 DIAGNOSIS — E871 Hypo-osmolality and hyponatremia: Secondary | ICD-10-CM

## 2022-03-12 DIAGNOSIS — R519 Headache, unspecified: Secondary | ICD-10-CM | POA: Diagnosis not present

## 2022-03-12 LAB — TROPONIN I (HIGH SENSITIVITY): Troponin I (High Sensitivity): 5 ng/L (ref <18)

## 2022-03-12 MED ORDER — SODIUM CHLORIDE 0.9 % IV BOLUS
1000.0000 mL | Freq: Once | INTRAVENOUS | Status: AC
  Administered 2022-03-12: 1000 mL via INTRAVENOUS

## 2022-03-12 NOTE — ED Notes (Signed)
Complaining of a headache that originates above the rt temple and makes her dizzy at times.

## 2022-03-12 NOTE — ED Provider Notes (Signed)
[name]  Provider Note  CSN: [id number] Arrival date & time: 03/11/22 2112  History Chief Complaint  Patient presents with   Headache    Lauren [name] is a 81 y.o. female brought to the ED by her son who translates from [place]. Offered video interpreter but they decline. Patient has had three days intermittent right sided headache and feeling dizzy. She has had decreased appetite. She is drinking fluids. No fever or vomiting. No room spinning. She was seen at [facility] and sent to the ED for evaluation. She has had similar headaches before. Took APAP with minimal improvement.    Home Medications Prior to Admission medications   Medication Sig Start Date End Date Taking? Authorizing Provider  ascorbic acid (VITAMIN C) 500 MG tablet Take 500 mg by mouth daily.    [provider]  cefdinir (OMNICEF) 300 MG capsule Take 1 capsule (300 mg total) by mouth 2 (two) times daily. 11/21/20   Quintella Reichert, MD  Cholecalciferol (VITAMIN D3) 2000 UNITS TABS Take 1 tablet by mouth daily.    [provider]  COVID-19 mRNA Vac-TriS, Pfizer, SUSP injection Inject into the muscle. 09/23/20   Carlyle Basques, MD  glucosamine-chondroitin 500-400 MG tablet Take 1 tablet by mouth 3 (three) times daily.    [provider]  glucose blood (ACCU-CHEK AVIVA PLUS) test strip Use to check blood sugar 3 times daily. 01/20/22     levothyroxine (SYNTHROID, LEVOTHROID) 75 MCG tablet Take 75 mcg by mouth daily.    [provider]  losartan (COZAAR) 100 MG tablet Take 100 mg by mouth daily.    [provider]  methocarbamol (ROBAXIN) 500 MG tablet Take 1 tablet (500 mg total) by mouth every 6 (six) hours as needed for muscle spasms. 02/26/20   Edmisten, Ok Anis, PA  Multiple Vitamin (MULTIVITAMIN) tablet Take 1 tablet by mouth daily.    [provider]  Omega-3 Fatty Acids (FISH OIL) 500 MG CAPS Take 500 mg by mouth 2 (two) times daily.     [provider]  omeprazole (PRILOSEC) 40 MG capsule Take 1 capsule (40 mg total) by mouth daily. 03/12/15   Pyrtle, Lajuan Lines, MD  ondansetron (ZOFRAN-ODT) 4 MG disintegrating tablet Take 1 tablet (4 mg total) by mouth every 8 (eight) hours as needed for nausea or vomiting. 09/15/21   White, Leitha Schuller, NP  oxyCODONE (OXY IR/ROXICODONE) 5 MG immediate release tablet Take 1-2 tablets (5-10 mg total) by mouth every 6 (six) hours as needed for severe pain. 02/26/20   Edmisten, Kristie L, PA  pravastatin (PRAVACHOL) 40 MG tablet Take 40 mg by mouth daily. 01/12/20   [provider]  sitaGLIPtan-metformin (JANUMET) 50-500 MG per tablet Take 1 tablet by mouth 2 (two) times daily with a meal.    [provider]  vitamin B-12 (CYANOCOBALAMIN) 100 MCG tablet Take 100 mcg by mouth daily.    [provider]     Allergies    Pyridium [phenazopyridine hcl], Sulfa antibiotics, Ciprofloxacin hcl, Esomeprazole magnesium, Macrodantin [nitrofurantoin macrocrystal], Pantoprazole sodium, and Tramadol   Review of Systems   Review of Systems Please see HPI for pertinent positives and negatives  Physical Exam BP 138/67   Pulse 70   Temp 98.1 F (36.7 C)   Resp 16   SpO2 99%   Physical Exam Vitals and nursing note reviewed.  Constitutional:      Appearance: Normal appearance.  HENT:     Head: Normocephalic and  atraumatic.     Nose: Nose normal.     Mouth/Throat:     Mouth: Mucous membranes are moist.  Eyes:     Extraocular Movements: Extraocular movements intact.     Conjunctiva/sclera: Conjunctivae normal.     Comments: No nystagmus  Cardiovascular:     Rate and Rhythm: Normal rate.  Pulmonary:     Effort: Pulmonary effort is normal.     Breath sounds: Normal breath sounds.  Abdominal:     General: Abdomen is flat.     Palpations: Abdomen is soft.     Tenderness: There is no abdominal tenderness.  Musculoskeletal:        General: No swelling. Normal range of  motion.     Cervical back: Neck supple.  Skin:    General: Skin is warm and dry.  Neurological:     General: No focal deficit present.     Mental Status: She is alert and oriented to person, place, and time.     Cranial Nerves: No cranial nerve deficit.     Sensory: No sensory deficit.     Motor: No weakness.     Coordination: Coordination normal.     Gait: Gait normal.  Psychiatric:        Mood and Affect: Mood normal.     ED Results / Procedures / Treatments   EKG None  Procedures Procedures  Medications Ordered in the ED Medications  sodium chloride 0.9 % bolus 1,000 mL (1,000 mLs Intravenous New Bag/Given 03/12/22 0209)    Initial Impression and Plan  Patient here with nonspecific headache and dizziness. Neuro exam is reassuring. Vitals are unremarkable. Labs done in triage show CBC with mild anemia. CMP with mild hyponatremia. Nasal swab is neg. Will add CT head to complete workup. Will give IVF and reassess for symptom improvement. May or may not need admission depending on symptoms.   ED Course   Clinical Course as of 03/12/22 0430  Fri Mar 12, 2022  0246 Trop is neg.  [CS]  [phone] I personally viewed the images from radiology studies and agree with radiologist interpretation: CT is neg.  [CS]  1448 Patient is feeling better, headache has resolved and no longer dizzy. Discussed with her that she should increase the sodium in her diet and follow up with PCP for recheck next week. Referral to Neurology if headache continues. RTED for any other concerns. [CS]    Clinical Course User Index [CS] Truddie Hidden, MD     MDM Rules/Calculators/A&P Medical Decision Making Given presenting complaint, I considered that admission might be necessary. After review of results from ED lab and/or imaging studies, admission to the hospital is not indicated at this time.    Problems Addressed: Acute nonintractable headache, unspecified headache type: acute illness or  injury Hyponatremia: acute illness or injury  Amount and/or Complexity of Data Reviewed Labs: ordered. Decision-making details documented in ED Course. Radiology: ordered and independent interpretation performed. Decision-making details documented in ED Course.  Risk Decision regarding hospitalization.    Final Clinical Impression(s) / ED Diagnoses Final diagnoses:  Acute nonintractable headache, unspecified headache type  Hyponatremia    Rx / DC Orders ED Discharge Orders     None        Truddie Hidden, MD 03/12/22 0430

## 2022-03-12 NOTE — ED Notes (Signed)
Pt ambulatory to bathroom

## 2022-03-27 ENCOUNTER — Encounter (HOSPITAL_BASED_OUTPATIENT_CLINIC_OR_DEPARTMENT_OTHER): Payer: Self-pay | Admitting: Emergency Medicine

## 2022-03-27 ENCOUNTER — Inpatient Hospital Stay (HOSPITAL_BASED_OUTPATIENT_CLINIC_OR_DEPARTMENT_OTHER)
Admission: EM | Admit: 2022-03-27 | Discharge: 2022-04-09 | DRG: 853 | Disposition: A | Discharge status: Home Health | Payer: Medicare Other | Attending: Internal Medicine | Admitting: Internal Medicine

## 2022-03-27 ENCOUNTER — Other Ambulatory Visit: Payer: Self-pay

## 2022-03-27 ENCOUNTER — Emergency Department (HOSPITAL_BASED_OUTPATIENT_CLINIC_OR_DEPARTMENT_OTHER): Payer: Medicare Other

## 2022-03-27 DIAGNOSIS — R935 Abnormal findings on diagnostic imaging of other abdominal regions, including retroperitoneum: Secondary | ICD-10-CM | POA: Diagnosis not present

## 2022-03-27 DIAGNOSIS — Z7984 Long term (current) use of oral hypoglycemic drugs: Secondary | ICD-10-CM

## 2022-03-27 DIAGNOSIS — T502X5A Adverse effect of carbonic-anhydrase inhibitors, benzothiadiazides and other diuretics, initial encounter: Secondary | ICD-10-CM | POA: Diagnosis not present

## 2022-03-27 DIAGNOSIS — D6959 Other secondary thrombocytopenia: Secondary | ICD-10-CM | POA: Diagnosis not present

## 2022-03-27 DIAGNOSIS — R188 Other ascites: Secondary | ICD-10-CM | POA: Diagnosis not present

## 2022-03-27 DIAGNOSIS — R1013 Epigastric pain: Secondary | ICD-10-CM | POA: Diagnosis not present

## 2022-03-27 DIAGNOSIS — E039 Hypothyroidism, unspecified: Secondary | ICD-10-CM | POA: Diagnosis not present

## 2022-03-27 DIAGNOSIS — Z882 Allergy status to sulfonamides status: Secondary | ICD-10-CM

## 2022-03-27 DIAGNOSIS — R7989 Other specified abnormal findings of blood chemistry: Secondary | ICD-10-CM | POA: Diagnosis not present

## 2022-03-27 DIAGNOSIS — K810 Acute cholecystitis: Secondary | ICD-10-CM | POA: Diagnosis not present

## 2022-03-27 DIAGNOSIS — E1122 Type 2 diabetes mellitus with diabetic chronic kidney disease: Secondary | ICD-10-CM | POA: Diagnosis not present

## 2022-03-27 DIAGNOSIS — K819 Cholecystitis, unspecified: Secondary | ICD-10-CM | POA: Diagnosis not present

## 2022-03-27 DIAGNOSIS — K750 Abscess of liver: Secondary | ICD-10-CM | POA: Diagnosis present

## 2022-03-27 DIAGNOSIS — R7401 Elevation of levels of liver transaminase levels: Secondary | ICD-10-CM

## 2022-03-27 DIAGNOSIS — N390 Urinary tract infection, site not specified: Secondary | ICD-10-CM | POA: Diagnosis present

## 2022-03-27 DIAGNOSIS — K449 Diaphragmatic hernia without obstruction or gangrene: Secondary | ICD-10-CM | POA: Diagnosis not present

## 2022-03-27 DIAGNOSIS — E8770 Fluid overload, unspecified: Secondary | ICD-10-CM | POA: Diagnosis not present

## 2022-03-27 DIAGNOSIS — R1310 Dysphagia, unspecified: Secondary | ICD-10-CM | POA: Diagnosis not present

## 2022-03-27 DIAGNOSIS — E119 Type 2 diabetes mellitus without complications: Secondary | ICD-10-CM

## 2022-03-27 DIAGNOSIS — K828 Other specified diseases of gallbladder: Secondary | ICD-10-CM | POA: Diagnosis not present

## 2022-03-27 DIAGNOSIS — E876 Hypokalemia: Secondary | ICD-10-CM | POA: Diagnosis not present

## 2022-03-27 DIAGNOSIS — D631 Anemia in chronic kidney disease: Secondary | ICD-10-CM | POA: Diagnosis present

## 2022-03-27 DIAGNOSIS — A419 Sepsis, unspecified organism: Principal | ICD-10-CM

## 2022-03-27 DIAGNOSIS — D638 Anemia in other chronic diseases classified elsewhere: Secondary | ICD-10-CM

## 2022-03-27 DIAGNOSIS — D72829 Elevated white blood cell count, unspecified: Secondary | ICD-10-CM | POA: Diagnosis not present

## 2022-03-27 DIAGNOSIS — K8062 Calculus of gallbladder and bile duct with acute cholecystitis without obstruction: Secondary | ICD-10-CM | POA: Diagnosis not present

## 2022-03-27 DIAGNOSIS — Z79890 Hormone replacement therapy (postmenopausal): Secondary | ICD-10-CM

## 2022-03-27 DIAGNOSIS — Z888 Allergy status to other drugs, medicaments and biological substances status: Secondary | ICD-10-CM

## 2022-03-27 DIAGNOSIS — H9190 Unspecified hearing loss, unspecified ear: Secondary | ICD-10-CM | POA: Diagnosis present

## 2022-03-27 DIAGNOSIS — I2510 Atherosclerotic heart disease of native coronary artery without angina pectoris: Secondary | ICD-10-CM | POA: Diagnosis not present

## 2022-03-27 DIAGNOSIS — Z8673 Personal history of transient ischemic attack (TIA), and cerebral infarction without residual deficits: Secondary | ICD-10-CM

## 2022-03-27 DIAGNOSIS — I129 Hypertensive chronic kidney disease with stage 1 through stage 4 chronic kidney disease, or unspecified chronic kidney disease: Secondary | ICD-10-CM | POA: Diagnosis not present

## 2022-03-27 DIAGNOSIS — K838 Other specified diseases of biliary tract: Secondary | ICD-10-CM | POA: Diagnosis not present

## 2022-03-27 DIAGNOSIS — R339 Retention of urine, unspecified: Secondary | ICD-10-CM | POA: Diagnosis not present

## 2022-03-27 DIAGNOSIS — Z96652 Presence of left artificial knee joint: Secondary | ICD-10-CM | POA: Diagnosis present

## 2022-03-27 DIAGNOSIS — E872 Acidosis, unspecified: Secondary | ICD-10-CM

## 2022-03-27 DIAGNOSIS — I898 Other specified noninfective disorders of lymphatic vessels and lymph nodes: Secondary | ICD-10-CM | POA: Diagnosis not present

## 2022-03-27 DIAGNOSIS — B9620 Unspecified Escherichia coli [E. coli] as the cause of diseases classified elsewhere: Secondary | ICD-10-CM | POA: Diagnosis present

## 2022-03-27 DIAGNOSIS — K219 Gastro-esophageal reflux disease without esophagitis: Secondary | ICD-10-CM | POA: Diagnosis present

## 2022-03-27 DIAGNOSIS — Z781 Physical restraint status: Secondary | ICD-10-CM

## 2022-03-27 DIAGNOSIS — K760 Fatty (change of) liver, not elsewhere classified: Secondary | ICD-10-CM | POA: Diagnosis present

## 2022-03-27 DIAGNOSIS — I10 Essential (primary) hypertension: Secondary | ICD-10-CM | POA: Diagnosis present

## 2022-03-27 DIAGNOSIS — K759 Inflammatory liver disease, unspecified: Secondary | ICD-10-CM | POA: Diagnosis not present

## 2022-03-27 DIAGNOSIS — K8309 Other cholangitis: Secondary | ICD-10-CM | POA: Diagnosis not present

## 2022-03-27 DIAGNOSIS — E785 Hyperlipidemia, unspecified: Secondary | ICD-10-CM | POA: Diagnosis not present

## 2022-03-27 DIAGNOSIS — J9811 Atelectasis: Secondary | ICD-10-CM | POA: Diagnosis not present

## 2022-03-27 DIAGNOSIS — R6521 Severe sepsis with septic shock: Secondary | ICD-10-CM | POA: Diagnosis not present

## 2022-03-27 DIAGNOSIS — K8030 Calculus of bile duct with cholangitis, unspecified, without obstruction: Secondary | ICD-10-CM | POA: Diagnosis not present

## 2022-03-27 DIAGNOSIS — Z881 Allergy status to other antibiotic agents status: Secondary | ICD-10-CM | POA: Diagnosis not present

## 2022-03-27 DIAGNOSIS — D539 Nutritional anemia, unspecified: Secondary | ICD-10-CM | POA: Diagnosis present

## 2022-03-27 DIAGNOSIS — D7389 Other diseases of spleen: Secondary | ICD-10-CM | POA: Diagnosis not present

## 2022-03-27 DIAGNOSIS — N3289 Other specified disorders of bladder: Secondary | ICD-10-CM | POA: Diagnosis not present

## 2022-03-27 DIAGNOSIS — K8050 Calculus of bile duct without cholangitis or cholecystitis without obstruction: Secondary | ICD-10-CM | POA: Diagnosis not present

## 2022-03-27 DIAGNOSIS — Z885 Allergy status to narcotic agent status: Secondary | ICD-10-CM

## 2022-03-27 DIAGNOSIS — E861 Hypovolemia: Secondary | ICD-10-CM | POA: Diagnosis present

## 2022-03-27 DIAGNOSIS — R579 Shock, unspecified: Secondary | ICD-10-CM | POA: Diagnosis not present

## 2022-03-27 DIAGNOSIS — N182 Chronic kidney disease, stage 2 (mild): Secondary | ICD-10-CM | POA: Diagnosis not present

## 2022-03-27 DIAGNOSIS — I700 Atherosclerosis of aorta: Secondary | ICD-10-CM | POA: Diagnosis present

## 2022-03-27 DIAGNOSIS — D509 Iron deficiency anemia, unspecified: Secondary | ICD-10-CM | POA: Diagnosis present

## 2022-03-27 DIAGNOSIS — K812 Acute cholecystitis with chronic cholecystitis: Secondary | ICD-10-CM | POA: Diagnosis not present

## 2022-03-27 DIAGNOSIS — E559 Vitamin D deficiency, unspecified: Secondary | ICD-10-CM | POA: Diagnosis present

## 2022-03-27 DIAGNOSIS — M810 Age-related osteoporosis without current pathological fracture: Secondary | ICD-10-CM | POA: Diagnosis not present

## 2022-03-27 DIAGNOSIS — J90 Pleural effusion, not elsewhere classified: Secondary | ICD-10-CM | POA: Diagnosis not present

## 2022-03-27 DIAGNOSIS — D696 Thrombocytopenia, unspecified: Secondary | ICD-10-CM | POA: Diagnosis not present

## 2022-03-27 DIAGNOSIS — K8033 Calculus of bile duct with acute cholangitis with obstruction: Secondary | ICD-10-CM | POA: Diagnosis not present

## 2022-03-27 DIAGNOSIS — R42 Dizziness and giddiness: Secondary | ICD-10-CM

## 2022-03-27 DIAGNOSIS — R945 Abnormal results of liver function studies: Secondary | ICD-10-CM | POA: Diagnosis not present

## 2022-03-27 DIAGNOSIS — R110 Nausea: Secondary | ICD-10-CM | POA: Diagnosis present

## 2022-03-27 DIAGNOSIS — I774 Celiac artery compression syndrome: Secondary | ICD-10-CM | POA: Diagnosis not present

## 2022-03-27 DIAGNOSIS — R1084 Generalized abdominal pain: Secondary | ICD-10-CM | POA: Diagnosis not present

## 2022-03-27 DIAGNOSIS — Z79899 Other long term (current) drug therapy: Secondary | ICD-10-CM

## 2022-03-27 LAB — URINALYSIS, ROUTINE W REFLEX MICROSCOPIC
Bilirubin Urine: NEGATIVE
Glucose, UA: NEGATIVE mg/dL
Hgb urine dipstick: NEGATIVE
Ketones, ur: 15 mg/dL — AB
Leukocytes,Ua: NEGATIVE
Nitrite: POSITIVE — AB
Protein, ur: 30 mg/dL — AB
Specific Gravity, Urine: 1.015 (ref 1.005–1.030)
pH: 8.5 — ABNORMAL HIGH (ref 5.0–8.0)

## 2022-03-27 LAB — CBC
HCT: 31.6 % — ABNORMAL LOW (ref 36.0–46.0)
Hemoglobin: 10.7 g/dL — ABNORMAL LOW (ref 12.0–15.0)
MCH: 30.3 pg (ref 26.0–34.0)
MCHC: 33.9 g/dL (ref 30.0–36.0)
MCV: 89.5 fL (ref 80.0–100.0)
Platelets: 218 10*3/uL (ref 150–400)
RBC: 3.53 MIL/uL — ABNORMAL LOW (ref 3.87–5.11)
RDW: 14.6 % (ref 11.5–15.5)
WBC: 5.2 10*3/uL (ref 4.0–10.5)
nRBC: 0 % (ref 0.0–0.2)

## 2022-03-27 LAB — COMPREHENSIVE METABOLIC PANEL
ALT: 546 U/L — ABNORMAL HIGH (ref 0–44)
AST: 1803 U/L — ABNORMAL HIGH (ref 15–41)
Albumin: 3.6 g/dL (ref 3.5–5.0)
Alkaline Phosphatase: 194 U/L — ABNORMAL HIGH (ref 38–126)
Anion gap: 12 (ref 5–15)
BUN: 17 mg/dL (ref 8–23)
CO2: 22 mmol/L (ref 22–32)
Calcium: 9.1 mg/dL (ref 8.9–10.3)
Chloride: 97 mmol/L — ABNORMAL LOW (ref 98–111)
Creatinine, Ser: 0.52 mg/dL (ref 0.44–1.00)
GFR, Estimated: >60 mL/min (ref >60)
Glucose, Bld: 156 mg/dL — ABNORMAL HIGH (ref 70–99)
Potassium: 3.4 mmol/L — ABNORMAL LOW (ref 3.5–5.1)
Sodium: 131 mmol/L — ABNORMAL LOW (ref 135–145)
Total Bilirubin: 1.3 mg/dL — ABNORMAL HIGH (ref 0.3–1.2)
Total Protein: 6.9 g/dL (ref 6.5–8.1)

## 2022-03-27 LAB — LACTIC ACID, PLASMA
Lactic Acid, Venous: 2.1 mmol/L (ref 0.5–1.9)
Lactic Acid, Venous: 1.4 mmol/L (ref 0.5–1.9)

## 2022-03-27 LAB — URINALYSIS, MICROSCOPIC (REFLEX)

## 2022-03-27 LAB — TROPONIN I (HIGH SENSITIVITY)
Troponin I (High Sensitivity): 7 ng/L (ref <18)
Troponin I (High Sensitivity): 7 ng/L (ref <18)

## 2022-03-27 LAB — ACETAMINOPHEN LEVEL: Acetaminophen (Tylenol), Serum: <10 ug/mL — ABNORMAL LOW (ref 10–30)

## 2022-03-27 LAB — LIPASE, BLOOD: Lipase: 61 U/L — ABNORMAL HIGH (ref 11–51)

## 2022-03-27 MED ORDER — MORPHINE SULFATE (PF) 4 MG/ML IV SOLN
4.0000 mg | Freq: Once | INTRAVENOUS | Status: AC
  Administered 2022-03-27: 4 mg via INTRAVENOUS
  Filled 2022-03-27: qty 1

## 2022-03-27 MED ORDER — HYDROMORPHONE HCL 1 MG/ML IJ SOLN
0.5000 mg | INTRAMUSCULAR | Status: DC | PRN
  Administered 2022-03-28: 0.5 mg via INTRAVENOUS
  Filled 2022-03-27: qty 1

## 2022-03-27 MED ORDER — IOHEXOL 300 MG/ML  SOLN: 100.0000 mL | Freq: Once | INTRAMUSCULAR | Status: DC | PRN

## 2022-03-27 MED ORDER — ONDANSETRON HCL 4 MG/2ML IJ SOLN
4.0000 mg | Freq: Four times a day (QID) | INTRAMUSCULAR | Status: DC | PRN
  Administered 2022-03-27 (×2): 4 mg via INTRAVENOUS
  Filled 2022-03-27 (×2): qty 2

## 2022-03-27 MED ORDER — LACTATED RINGERS IV BOLUS
1000.0000 mL | Freq: Once | INTRAVENOUS | Status: AC
  Administered 2022-03-27: 1000 mL via INTRAVENOUS

## 2022-03-27 MED ORDER — HYDROMORPHONE HCL 1 MG/ML IJ SOLN
0.5000 mg | Freq: Once | INTRAMUSCULAR | Status: AC
  Administered 2022-03-27: 0.5 mg via INTRAVENOUS
  Filled 2022-03-27: qty 1

## 2022-03-27 MED ORDER — IOHEXOL 350 MG/ML SOLN
100.0000 mL | Freq: Once | INTRAVENOUS | Status: AC | PRN
  Administered 2022-03-27: 100 mL via INTRAVENOUS

## 2022-03-27 NOTE — Progress Notes (Signed)
Plan of Care Note for accepted transfer   Patient: Lauren [name] MRN: [id number]   DOA: 03/27/2022  Facility requesting transfer: [facility]   Requesting Provider: Dr. Mayra Neer   Reason for transfer: Abdominal pain with elevated LFTs   Facility course: 82 yr old lady with hx of HTN, HLD, DM, and hypothyroidism presents with acute-onset epigastric pain. She is afebrile and found to have AST 1803, ALT 546, and t bili 1.3. Initial lactate was 2.1. CT chest/abd/pelvis negative for acute findings. RUQ [place] was ordered by sonographer is gone for the night. She was treated with IVF, morphine, and Zofran.   Plan of care: The patient is accepted for admission to [place]  unit, at [facility].   Author: Vianne Bulls, MD 03/27/2022  Check [email or web address] for on-call coverage.  Nursing staff, Please call [place] number on Amion as soon as patient's arrival, so appropriate admitting provider can evaluate the pt.

## 2022-03-27 NOTE — ED Provider Notes (Signed)
[facility] Provider Note   CSN: [id number] Arrival date & time: 03/27/22  1901     History  Chief Complaint  Patient presents with   Abdominal Pain    Lauren [name] is a 82 y.o. female with HTN, HLD, CKD stage 2, T2DM, GERD, hyopthyroidism, osteoporosis, macrocytic anemia, iron overload,  hearing loss, urinary retention, vertigo who presents with abd pain.   Patient is [place] speaking and son at bedside acts as interpreter.  Pt c/o epigastric abd pain that began at 3:34 PM.  Patient was eating bacon with rice and swallowed a large piece of bacon and approximately 2 to 3 minutes afterwards she started to have severe epigastric pain.  She felt like the bacon went down just fine and does not feel nauseous and has had no vomiting.  She is not drooling and has no trouble swallowing her saliva.  Denies choking. Took pepcid w/o relief. Pain is nonradiating, no numbness/tingling anywhere, rated >10/10 and feels stabbing. No fevers/chills, cough, SOB, urinary symptoms, D/C. Was in her [place] prior to 3:30 pm.    Abdominal Pain      Home Medications Prior to Admission medications   Medication Sig Start Date End Date Taking? Authorizing Provider  ascorbic acid (VITAMIN C) 500 MG tablet Take 500 mg by mouth daily.    [provider]  cefdinir (OMNICEF) 300 MG capsule Take 1 capsule (300 mg total) by mouth 2 (two) times daily. 11/21/20   Quintella Reichert, MD  Cholecalciferol (VITAMIN D3) 2000 UNITS TABS Take 1 tablet by mouth daily.    [provider]  COVID-19 mRNA Vac-TriS, Pfizer, SUSP injection Inject into the muscle. 09/23/20   Carlyle Basques, MD  glucosamine-chondroitin 500-400 MG tablet Take 1 tablet by mouth 3 (three) times daily.    [provider]  glucose blood (ACCU-CHEK AVIVA PLUS) test strip Use to check blood sugar 3 times daily. 01/20/22     levothyroxine (SYNTHROID, LEVOTHROID) 75 MCG tablet Take 75 mcg by mouth daily.     [provider]  losartan (COZAAR) 100 MG tablet Take 100 mg by mouth daily.    [provider]  methocarbamol (ROBAXIN) 500 MG tablet Take 1 tablet (500 mg total) by mouth every 6 (six) hours as needed for muscle spasms. 02/26/20   Edmisten, Ok Anis, PA  Multiple Vitamin (MULTIVITAMIN) tablet Take 1 tablet by mouth daily.    [provider]  Omega-3 Fatty Acids (FISH OIL) 500 MG CAPS Take 500 mg by mouth 2 (two) times daily.    [provider]  omeprazole (PRILOSEC) 40 MG capsule Take 1 capsule (40 mg total) by mouth daily. 03/12/15   Pyrtle, Lajuan Lines, MD  ondansetron (ZOFRAN-ODT) 4 MG disintegrating tablet Take 1 tablet (4 mg total) by mouth every 8 (eight) hours as needed for nausea or vomiting. 09/15/21   White, Leitha Schuller, NP  oxyCODONE (OXY IR/ROXICODONE) 5 MG immediate release tablet Take 1-2 tablets (5-10 mg total) by mouth every 6 (six) hours as needed for severe pain. 02/26/20   Edmisten, Kristie L, PA  pravastatin (PRAVACHOL) 40 MG tablet Take 40 mg by mouth daily. 01/12/20   [provider]  sitaGLIPtan-metformin (JANUMET) 50-500 MG per tablet Take 1 tablet by mouth 2 (two) times daily with a meal.    [provider]  vitamin B-12 (CYANOCOBALAMIN) 100 MCG tablet Take 100 mcg by mouth daily.    [provider]      Allergies  Pyridium [phenazopyridine hcl], Sulfa antibiotics, Ciprofloxacin hcl, Esomeprazole magnesium, Macrodantin [nitrofurantoin macrocrystal], Pantoprazole sodium, and Tramadol    Review of Systems   Review of Systems  Gastrointestinal:  Positive for abdominal pain.   Review of systems Negative for f/c.  A 10 point review of systems was performed and is negative unless otherwise reported in HPI.  Physical Exam Updated Vital Signs BP (!) 167/81   Pulse 91   Temp 97.9 F (36.6 C) (Oral)   Resp 14   SpO2 98%  Physical Exam General: Very uncomfortable-appearing female, lying in bed.  HEENT: Sclera  anicteric, MMM, trachea midline.  Cardiology: RRR, no murmurs/rubs/gallops. BL radial and DP pulses equal bilaterally.  Resp: Normal respiratory rate and effort. CTAB, no wheezes, rhonchi, crackles.  Abd: +TTP in epigastric region. Soft, non-tender, non-distended. No rebound tenderness or guarding.  GU: Deferred. MSK: Mild 1+ peripheral edema bilaterally symmetric in ankles. No signs of trauma. Extremities without deformity or TTP. No cyanosis or clubbing. Skin: warm, dry. No rashes or lesions. Back: No CVA tenderness Neuro: A&Ox4, CNs II-XII grossly intact. MAEs. Sensation grossly intact.  Psych: Very uncomfortable, grimacing in pain. Normal affect.  ED Results / Procedures / Treatments   Labs (all labs ordered are listed, but only abnormal results are displayed) Labs Reviewed  LIPASE, BLOOD - Abnormal; Notable for the following components:      Result Value   Lipase 61 (*)    All other components within normal limits  COMPREHENSIVE METABOLIC PANEL - Abnormal; Notable for the following components:   Sodium 131 (*)    Potassium 3.4 (*)    Chloride 97 (*)    Glucose, Bld 156 (*)    AST 1,803 (*)    ALT 546 (*)    Alkaline Phosphatase 194 (*)    Total Bilirubin 1.3 (*)    All other components within normal limits  CBC - Abnormal; Notable for the following components:   RBC 3.53 (*)    Hemoglobin 10.7 (*)    HCT 31.6 (*)    All other components within normal limits  URINALYSIS, ROUTINE W REFLEX MICROSCOPIC - Abnormal; Notable for the following components:   APPearance HAZY (*)    pH 8.5 (*)    Ketones, ur 15 (*)    Protein, ur 30 (*)    Nitrite POSITIVE (*)    All other components within normal limits  LACTIC ACID, PLASMA - Abnormal; Notable for the following components:   Lactic Acid, Venous 2.1 (*)    All other components within normal limits  ACETAMINOPHEN LEVEL - Abnormal; Notable for the following components:   Acetaminophen (Tylenol), Serum <10 (*)    All other  components within normal limits  URINALYSIS, MICROSCOPIC (REFLEX) - Abnormal; Notable for the following components:   Bacteria, UA MANY (*)    All other components within normal limits  URINE CULTURE  LACTIC ACID, PLASMA  HEPATITIS PANEL, ACUTE  GAMMA GT  LACTATE DEHYDROGENASE  TROPONIN I (HIGH SENSITIVITY)  TROPONIN I (HIGH SENSITIVITY)    EKG EKG Interpretation  Date/Time:  Saturday March 27 2022 19:10:16 EST Ventricular Rate:  82 PR Interval:  191 QRS Duration: 97 QT Interval:  425 QTC Calculation: 497 R Axis:   91 Text Interpretation: Sinus rhythm Borderline prolonged QT interval Confirmed by Cindee Lame [phone]) on 03/27/2022 7:15:36 PM  Radiology CT Angio Chest/Abd/Pel for Dissection W and/or Wo Contrast  Result Date: 03/27/2022 CLINICAL DATA:  Acute aortic syndrome (AAS) suspected severe epigastric pain EXAM:  CT ANGIOGRAPHY CHEST, ABDOMEN AND PELVIS TECHNIQUE: Non-contrast CT of the chest was initially obtained. Multidetector CT imaging through the chest, abdomen and pelvis was performed using the standard protocol during bolus administration of intravenous contrast. Multiplanar reconstructed images and MIPs were obtained and reviewed to evaluate the vascular anatomy. RADIATION DOSE REDUCTION: This exam was performed according to the departmental dose-optimization program which includes automated exposure control, adjustment of the mA and/or kV according to patient size and/or use of iterative reconstruction technique. CONTRAST:  198m OMNIPAQUE IOHEXOL 350 MG/ML SOLN COMPARISON:  11/21/2020 FINDINGS: CTA CHEST FINDINGS Cardiovascular: Heart is normal size. Aorta is normal caliber. No evidence of aortic dissection. No pulmonary embolus. Scattered coronary artery and aortic calcifications. Mediastinum/Nodes: Calcified mediastinal lymph nodes. No mediastinal, hilar, or axillary adenopathy. Trachea and esophagus are unremarkable. Thyroid unremarkable. Lungs/Pleura: Lungs are clear.  No focal airspace opacities or suspicious nodules. No effusions. Musculoskeletal: Chest wall soft tissues are unremarkable. No acute bony abnormality. Review of the MIP images confirms the above findings. CTA ABDOMEN AND PELVIS FINDINGS VASCULAR Aorta: Aortic calcifications.  No aneurysm or dissection. Celiac: Mild narrowing at the origin.  No aneurysm or dissection. SMA: Widely patent Renals: Widely patent IMA: Widely patent Inflow: Atherosclerotic calcifications.  No aneurysm or dissection. Veins: No obvious venous abnormality within the limitations of this arterial phase study. Review of the MIP images confirms the above findings. NON-VASCULAR Hepatobiliary: Low-density throughout the liver compatible with fatty infiltration. Gallbladder unremarkable. No biliary ductal dilatation. Pancreas: No focal abnormality or ductal dilatation. Spleen: Calcifications throughout the spleen compatible with old granulomatous disease. Normal size. Adrenals/Urinary Tract: No adrenal abnormality. No focal renal abnormality. No stones or hydronephrosis. Urinary bladder is unremarkable. Stomach/Bowel: Stomach, large and small bowel grossly unremarkable. Lymphatic: No adenopathy Reproductive: Uterus and adnexa unremarkable.  No mass. Other: No free fluid or free air. Musculoskeletal: No acute bony abnormality. Review of the MIP images confirms the above findings. IMPRESSION: No evidence of aortic aneurysm or dissection. Aortic atherosclerosis. Old granulomatous disease. No acute findings in the chest, abdomen or pelvis. Hepatic steatosis. Electronically Signed   By: KRolm BaptiseM.D.   On: 03/27/2022 21:14    Procedures Procedures    Medications Ordered in ED Medications  ondansetron (ZOFRAN) injection 4 mg (4 mg Intravenous Given 03/27/22 1949)  iohexol (OMNIPAQUE) 300 MG/ML solution 100 mL ( Intravenous Canceled Entry 03/27/22 2044)  morphine (PF) 4 MG/ML injection 4 mg (4 mg Intravenous Given 03/27/22 1950)  iohexol  (OMNIPAQUE) 350 MG/ML injection 100 mL (100 mLs Intravenous Contrast Given 03/27/22 2052)  morphine (PF) 4 MG/ML injection 4 mg (4 mg Intravenous Given 03/27/22 2158)  lactated ringers bolus 1,000 mL (1,000 mLs Intravenous New Bag/Given 03/27/22 2157)    ED Course/ Medical Decision Making/ A&P                          Medical Decision Making Amount and/or Complexity of Data Reviewed Labs: ordered. Decision-making details documented in ED Course. Radiology: ordered. Decision-making details documented in ED Course.  Risk Prescription drug management. Decision regarding hospitalization.    This patient presents to the ED for concern of severe epigastric, this involves an extensive number of treatment options, and is a complaint that carries with it a high risk of complications and morbidity.  I considered the following differential and admission for this acute, potentially life threatening condition.   MDM:    For DDX for abdominal pain includes but is not limited to:  Abdominal exam without peritoneal signs. No evidence of acute abdomen at this time.   Consider esophageal perforation or mediastinitis/pneumomediastinum, aortic dissection, pancreatitis for patient's severe epigastric pain. Patient w/ no nausea/vomiting, no trouble with secretions, lower c/f esophageal food impaction given location of pain as well but considered. Also consider biliary colic/cholelithiasis given onset after eating but pain is epigastric, not RUQ and neg murphy's sign. Consider also ACS/arrhythmia, viscous perforation, GERD/gastritis, mesenteric ischemia w/ pain after eating. Will obtain CT C/A/P and labs.    Clinical Course as of 03/27/22 2313  Sat Mar 27, 2022  2000 Hemoglobin(!): 10.7 C/w prior values [HN]  2000 WBC: 5.2 [HN]  2027 Lipase(!): 61 Not significantly elevated [HN]  2027 Troponin I (High Sensitivity): 7 [HN]  2027 AST(!): 1,803 [HN]  2027 ALT(!): 546 [HN]  2027 Alkaline Phosphatase(!):  194 [HN]  2027 Significantly elevated LFTs [HN]  2028 Activated code medical, as patient has not yet gotten her CT scan [HN]  2143 Lactic Acid, Venous(!!): 2.1 Will give fluids [HN]  2144 CT Angio Chest/Abd/Pel for Dissection W and/or Wo Contrast No evidence of aortic aneurysm or dissection. Aortic atherosclerosis.  Old granulomatous disease.  No acute findings in the chest, abdomen or pelvis.  Hepatic steatosis.   [HN]  2147 CTA C/A/P unremakrable except for hepatic steatosis [HN]  2255 Acetaminophen (Tylenol), S(!): <10 [HN]  2255 Lactic Acid, Venous: 1.4 [HN]  2312 Admitted to hospitalist for further w/u including [place] and possibly MRCP. Pain control w/ morphine.  [HN]    Clinical Course User Index [HN] Audley Hose, MD    Labs: I Ordered, and personally interpreted labs.  The pertinent results include:  those listed above  Imaging Studies ordered: I ordered imaging studies including CT C/A/P I independently visualized and interpreted imaging. I agree with the radiologist interpretation  Additional history obtained from son at bedside, chart review.  Cardiac Monitoring: The patient was maintained on a cardiac monitor.  I personally viewed and interpreted the cardiac monitored which showed an underlying rhythm of: NSR  Reevaluation: After the interventions noted above, I reevaluated the patient and found that they have :improved  Social Determinants of Health: Patient lives independently   Disposition:  admit to hospitalist  Co morbidities that complicate the patient evaluation  Past Medical History:  Diagnosis Date   Allergy    Aortic atherosclerosis ([place])    Arthritis    Diabetes mellitus    type 2    Diverticulosis    Dyspepsia    Fecal incontinence    Gastritis    GERD (gastroesophageal reflux disease)    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   Possible related to pyridium  G6PD studies pending   Hemorrhoids    Hiatal hernia     Hyperlipidemia    Hyperlipidemia    Hypertension    Hypothyroidism    IBS (irritable bowel syndrome)    Iron overload 07/13/2016   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   Osteoporosis    UTI (lower urinary tract infection)    Vitamin D deficiency      Medicines Meds ordered this encounter  Medications   morphine (PF) 4 MG/ML injection 4 mg   ondansetron (ZOFRAN) injection 4 mg   iohexol (OMNIPAQUE) 300 MG/ML solution 100 mL   iohexol (OMNIPAQUE) 350 MG/ML injection 100 mL   morphine (PF) 4 MG/ML injection 4 mg   lactated ringers bolus 1,000 mL    I have reviewed the patients home medicines and have made adjustments  as needed  Problem List / ED Course: Problem List Items Addressed This Visit   None Visit Diagnoses     Elevated LFTs    -  Primary   Epigastric pain                       This note was created using dictation software, which may contain spelling or grammatical errors.    Audley Hose, MD 03/27/22 [phone]

## 2022-03-27 NOTE — ED Triage Notes (Addendum)
Pt present to ED POV w/ son. Pt c/o epigastric abd pain that began ~4h ago after eating bacon. Pt denies choking on the bacon. Reports she was completely fine after that. Took pepcid w/o relief

## 2022-03-28 ENCOUNTER — Emergency Department (HOSPITAL_BASED_OUTPATIENT_CLINIC_OR_DEPARTMENT_OTHER): Payer: Medicare Other

## 2022-03-28 ENCOUNTER — Observation Stay (HOSPITAL_COMMUNITY): Payer: Medicare Other

## 2022-03-28 DIAGNOSIS — I700 Atherosclerosis of aorta: Secondary | ICD-10-CM | POA: Diagnosis present

## 2022-03-28 DIAGNOSIS — K219 Gastro-esophageal reflux disease without esophagitis: Secondary | ICD-10-CM | POA: Diagnosis present

## 2022-03-28 DIAGNOSIS — E559 Vitamin D deficiency, unspecified: Secondary | ICD-10-CM | POA: Diagnosis present

## 2022-03-28 DIAGNOSIS — I129 Hypertensive chronic kidney disease with stage 1 through stage 4 chronic kidney disease, or unspecified chronic kidney disease: Secondary | ICD-10-CM | POA: Diagnosis present

## 2022-03-28 DIAGNOSIS — R1013 Epigastric pain: Secondary | ICD-10-CM | POA: Diagnosis present

## 2022-03-28 DIAGNOSIS — D696 Thrombocytopenia, unspecified: Secondary | ICD-10-CM | POA: Diagnosis not present

## 2022-03-28 DIAGNOSIS — R935 Abnormal findings on diagnostic imaging of other abdominal regions, including retroperitoneum: Secondary | ICD-10-CM | POA: Diagnosis not present

## 2022-03-28 DIAGNOSIS — K449 Diaphragmatic hernia without obstruction or gangrene: Secondary | ICD-10-CM | POA: Diagnosis not present

## 2022-03-28 DIAGNOSIS — R7989 Other specified abnormal findings of blood chemistry: Secondary | ICD-10-CM | POA: Diagnosis present

## 2022-03-28 DIAGNOSIS — E876 Hypokalemia: Secondary | ICD-10-CM | POA: Diagnosis present

## 2022-03-28 DIAGNOSIS — N390 Urinary tract infection, site not specified: Secondary | ICD-10-CM | POA: Diagnosis present

## 2022-03-28 DIAGNOSIS — K759 Inflammatory liver disease, unspecified: Secondary | ICD-10-CM

## 2022-03-28 DIAGNOSIS — R1310 Dysphagia, unspecified: Secondary | ICD-10-CM | POA: Diagnosis not present

## 2022-03-28 DIAGNOSIS — K819 Cholecystitis, unspecified: Secondary | ICD-10-CM | POA: Diagnosis not present

## 2022-03-28 DIAGNOSIS — R7401 Elevation of levels of liver transaminase levels: Secondary | ICD-10-CM | POA: Diagnosis not present

## 2022-03-28 DIAGNOSIS — R579 Shock, unspecified: Secondary | ICD-10-CM | POA: Diagnosis not present

## 2022-03-28 DIAGNOSIS — K8050 Calculus of bile duct without cholangitis or cholecystitis without obstruction: Secondary | ICD-10-CM | POA: Diagnosis not present

## 2022-03-28 DIAGNOSIS — D638 Anemia in other chronic diseases classified elsewhere: Secondary | ICD-10-CM | POA: Diagnosis not present

## 2022-03-28 DIAGNOSIS — K8033 Calculus of bile duct with acute cholangitis with obstruction: Secondary | ICD-10-CM | POA: Diagnosis not present

## 2022-03-28 DIAGNOSIS — D6959 Other secondary thrombocytopenia: Secondary | ICD-10-CM | POA: Diagnosis present

## 2022-03-28 DIAGNOSIS — E8770 Fluid overload, unspecified: Secondary | ICD-10-CM | POA: Diagnosis not present

## 2022-03-28 DIAGNOSIS — E039 Hypothyroidism, unspecified: Secondary | ICD-10-CM | POA: Diagnosis present

## 2022-03-28 DIAGNOSIS — J90 Pleural effusion, not elsewhere classified: Secondary | ICD-10-CM | POA: Diagnosis not present

## 2022-03-28 DIAGNOSIS — D72829 Elevated white blood cell count, unspecified: Secondary | ICD-10-CM | POA: Diagnosis not present

## 2022-03-28 DIAGNOSIS — E785 Hyperlipidemia, unspecified: Secondary | ICD-10-CM | POA: Diagnosis present

## 2022-03-28 DIAGNOSIS — R188 Other ascites: Secondary | ICD-10-CM | POA: Diagnosis present

## 2022-03-28 DIAGNOSIS — E119 Type 2 diabetes mellitus without complications: Secondary | ICD-10-CM | POA: Diagnosis not present

## 2022-03-28 DIAGNOSIS — I10 Essential (primary) hypertension: Secondary | ICD-10-CM | POA: Diagnosis not present

## 2022-03-28 DIAGNOSIS — K8309 Other cholangitis: Secondary | ICD-10-CM | POA: Diagnosis not present

## 2022-03-28 DIAGNOSIS — D509 Iron deficiency anemia, unspecified: Secondary | ICD-10-CM | POA: Diagnosis present

## 2022-03-28 DIAGNOSIS — R339 Retention of urine, unspecified: Secondary | ICD-10-CM | POA: Diagnosis not present

## 2022-03-28 DIAGNOSIS — K812 Acute cholecystitis with chronic cholecystitis: Secondary | ICD-10-CM | POA: Diagnosis not present

## 2022-03-28 DIAGNOSIS — K760 Fatty (change of) liver, not elsewhere classified: Secondary | ICD-10-CM | POA: Diagnosis present

## 2022-03-28 DIAGNOSIS — J9811 Atelectasis: Secondary | ICD-10-CM | POA: Diagnosis not present

## 2022-03-28 DIAGNOSIS — E872 Acidosis, unspecified: Secondary | ICD-10-CM | POA: Diagnosis present

## 2022-03-28 DIAGNOSIS — A419 Sepsis, unspecified organism: Secondary | ICD-10-CM | POA: Diagnosis present

## 2022-03-28 DIAGNOSIS — Z881 Allergy status to other antibiotic agents status: Secondary | ICD-10-CM | POA: Diagnosis not present

## 2022-03-28 DIAGNOSIS — M810 Age-related osteoporosis without current pathological fracture: Secondary | ICD-10-CM | POA: Diagnosis present

## 2022-03-28 DIAGNOSIS — K838 Other specified diseases of biliary tract: Secondary | ICD-10-CM | POA: Diagnosis not present

## 2022-03-28 DIAGNOSIS — N182 Chronic kidney disease, stage 2 (mild): Secondary | ICD-10-CM | POA: Diagnosis present

## 2022-03-28 DIAGNOSIS — K810 Acute cholecystitis: Secondary | ICD-10-CM | POA: Diagnosis not present

## 2022-03-28 DIAGNOSIS — K750 Abscess of liver: Secondary | ICD-10-CM | POA: Diagnosis present

## 2022-03-28 DIAGNOSIS — E1122 Type 2 diabetes mellitus with diabetic chronic kidney disease: Secondary | ICD-10-CM | POA: Diagnosis present

## 2022-03-28 DIAGNOSIS — R945 Abnormal results of liver function studies: Secondary | ICD-10-CM | POA: Diagnosis not present

## 2022-03-28 DIAGNOSIS — K8030 Calculus of bile duct with cholangitis, unspecified, without obstruction: Secondary | ICD-10-CM | POA: Diagnosis not present

## 2022-03-28 DIAGNOSIS — R6521 Severe sepsis with septic shock: Secondary | ICD-10-CM | POA: Diagnosis present

## 2022-03-28 DIAGNOSIS — K828 Other specified diseases of gallbladder: Secondary | ICD-10-CM | POA: Diagnosis not present

## 2022-03-28 DIAGNOSIS — K8062 Calculus of gallbladder and bile duct with acute cholecystitis without obstruction: Secondary | ICD-10-CM | POA: Diagnosis present

## 2022-03-28 DIAGNOSIS — D631 Anemia in chronic kidney disease: Secondary | ICD-10-CM | POA: Diagnosis present

## 2022-03-28 DIAGNOSIS — N3289 Other specified disorders of bladder: Secondary | ICD-10-CM | POA: Diagnosis not present

## 2022-03-28 DIAGNOSIS — R1084 Generalized abdominal pain: Secondary | ICD-10-CM | POA: Diagnosis not present

## 2022-03-28 LAB — COMPREHENSIVE METABOLIC PANEL
ALT: 752 U/L — ABNORMAL HIGH (ref 0–44)
AST: 1332 U/L — ABNORMAL HIGH (ref 15–41)
Albumin: 3.1 g/dL — ABNORMAL LOW (ref 3.5–5.0)
Alkaline Phosphatase: 176 U/L — ABNORMAL HIGH (ref 38–126)
Anion gap: 15 (ref 5–15)
BUN: 25 mg/dL — ABNORMAL HIGH (ref 8–23)
CO2: 20 mmol/L — ABNORMAL LOW (ref 22–32)
Calcium: 8.3 mg/dL — ABNORMAL LOW (ref 8.9–10.3)
Chloride: 98 mmol/L (ref 98–111)
Creatinine, Ser: 1 mg/dL (ref 0.44–1.00)
GFR, Estimated: 56 mL/min — ABNORMAL LOW (ref >60)
Glucose, Bld: 197 mg/dL — ABNORMAL HIGH (ref 70–99)
Potassium: 2.9 mmol/L — ABNORMAL LOW (ref 3.5–5.1)
Sodium: 133 mmol/L — ABNORMAL LOW (ref 135–145)
Total Bilirubin: 3.4 mg/dL — ABNORMAL HIGH (ref 0.3–1.2)
Total Protein: 5.7 g/dL — ABNORMAL LOW (ref 6.5–8.1)

## 2022-03-28 LAB — CBC
HCT: 32.7 % — ABNORMAL LOW (ref 36.0–46.0)
HCT: 32.9 % — ABNORMAL LOW (ref 36.0–46.0)
Hemoglobin: 10.9 g/dL — ABNORMAL LOW (ref 12.0–15.0)
Hemoglobin: 11.4 g/dL — ABNORMAL LOW (ref 12.0–15.0)
MCH: 31.1 pg (ref 26.0–34.0)
MCH: 31.4 pg (ref 26.0–34.0)
MCHC: 33.3 g/dL (ref 30.0–36.0)
MCHC: 34.7 g/dL (ref 30.0–36.0)
MCV: 93.4 fL (ref 80.0–100.0)
MCV: 90.6 fL (ref 80.0–100.0)
Platelets: 118 10*3/uL — ABNORMAL LOW (ref 150–400)
Platelets: 61 10*3/uL — ABNORMAL LOW (ref 150–400)
RBC: 3.5 MIL/uL — ABNORMAL LOW (ref 3.87–5.11)
RBC: 3.63 MIL/uL — ABNORMAL LOW (ref 3.87–5.11)
RDW: 15 % (ref 11.5–15.5)
RDW: 14.9 % (ref 11.5–15.5)
WBC: 9.7 10*3/uL (ref 4.0–10.5)
WBC: 14.1 10*3/uL — ABNORMAL HIGH (ref 4.0–10.5)
nRBC: 0 % (ref 0.0–0.2)
nRBC: 0 % (ref 0.0–0.2)

## 2022-03-28 LAB — LACTIC ACID, PLASMA
Lactic Acid, Venous: 2.6 mmol/L (ref 0.5–1.9)
Lactic Acid, Venous: 2.3 mmol/L (ref 0.5–1.9)

## 2022-03-28 LAB — HEPATITIS PANEL, ACUTE
HCV Ab: NONREACTIVE
Hep A IgM: NONREACTIVE
Hep B C IgM: NONREACTIVE
Hepatitis B Surface Ag: NONREACTIVE

## 2022-03-28 LAB — LIPASE, BLOOD: Lipase: 93 U/L — ABNORMAL HIGH (ref 11–51)

## 2022-03-28 LAB — MAGNESIUM: Magnesium: 1.9 mg/dL (ref 1.7–2.4)

## 2022-03-28 LAB — GLUCOSE, CAPILLARY
Glucose-Capillary: 140 mg/dL — ABNORMAL HIGH (ref 70–99)
Glucose-Capillary: 191 mg/dL — ABNORMAL HIGH (ref 70–99)

## 2022-03-28 LAB — AMMONIA: Ammonia: 26 umol/L (ref 9–35)

## 2022-03-28 LAB — GAMMA GT: GGT: 415 U/L — ABNORMAL HIGH (ref 7–50)

## 2022-03-28 LAB — LACTATE DEHYDROGENASE: LDH: 1637 U/L — ABNORMAL HIGH (ref 98–192)

## 2022-03-28 LAB — PHOSPHORUS: Phosphorus: 3.8 mg/dL (ref 2.5–4.6)

## 2022-03-28 MED ORDER — BISACODYL 10 MG RE SUPP: 10.0000 mg | Freq: Two times a day (BID) | RECTAL | Status: DC | PRN

## 2022-03-28 MED ORDER — MAGNESIUM SULFATE IN D5W 1-5 GM/100ML-% IV SOLN
1.0000 g | Freq: Once | INTRAVENOUS | Status: DC
  Filled 2022-03-28: qty 100

## 2022-03-28 MED ORDER — KETOROLAC TROMETHAMINE 15 MG/ML IJ SOLN
7.5000 mg | Freq: Once | INTRAMUSCULAR | Status: AC
  Administered 2022-03-28: 7.5 mg via INTRAVENOUS
  Filled 2022-03-28: qty 1

## 2022-03-28 MED ORDER — SODIUM CHLORIDE 0.9 % IV SOLN: 8.0000 mg | Freq: Four times a day (QID) | INTRAVENOUS | Status: DC | PRN

## 2022-03-28 MED ORDER — LACTATED RINGERS IV BOLUS
1000.0000 mL | Freq: Once | INTRAVENOUS | Status: AC
  Administered 2022-03-28: 1000 mL via INTRAVENOUS

## 2022-03-28 MED ORDER — POTASSIUM CHLORIDE 10 MEQ/100ML IV SOLN: 10.0000 meq | INTRAVENOUS | Status: AC

## 2022-03-28 MED ORDER — FAMOTIDINE IN NACL 20-0.9 MG/50ML-% IV SOLN
20.0000 mg | INTRAVENOUS | Status: DC
  Filled 2022-03-28 (×2): qty 50

## 2022-03-28 MED ORDER — SODIUM CHLORIDE 0.9 % IV SOLN: 1.0000 g | INTRAVENOUS | Status: DC

## 2022-03-28 MED ORDER — PROCHLORPERAZINE EDISYLATE 10 MG/2ML IJ SOLN: 5.0000 mg | INTRAMUSCULAR | Status: DC | PRN

## 2022-03-28 MED ORDER — SODIUM CHLORIDE 0.9 % IV SOLN
250.0000 mL | INTRAVENOUS | Status: DC
  Administered 2022-03-28: 250 mL via INTRAVENOUS

## 2022-03-28 MED ORDER — PHENOL 1.4 % MT LIQD: 2.0000 | OROMUCOSAL | Status: DC | PRN

## 2022-03-28 MED ORDER — PIPERACILLIN-TAZOBACTAM 3.375 G IVPB
3.3750 g | Freq: Three times a day (TID) | INTRAVENOUS | Status: DC
  Administered 2022-03-28: 3.375 g via INTRAVENOUS

## 2022-03-28 MED ORDER — CHLORHEXIDINE GLUCONATE CLOTH 2 % EX PADS
6.0000 | MEDICATED_PAD | Freq: Every day | CUTANEOUS | Status: DC
  Administered 2022-03-28: 6 via TOPICAL

## 2022-03-28 MED ORDER — MENTHOL 3 MG MT LOZG: 1.0000 | LOZENGE | OROMUCOSAL | Status: DC | PRN

## 2022-03-28 MED ORDER — PIPERACILLIN-TAZOBACTAM 3.375 G IVPB
3.3750 g | Freq: Three times a day (TID) | INTRAVENOUS | Status: DC
  Filled 2022-03-28: qty 50

## 2022-03-28 MED ORDER — LIP MEDEX EX OINT
TOPICAL_OINTMENT | Freq: Two times a day (BID) | CUTANEOUS | Status: DC
  Administered 2022-03-29 – 2022-04-09 (×8): 75 via TOPICAL
  Filled 2022-03-28 (×3): qty 7

## 2022-03-28 MED ORDER — NALOXONE HCL 0.4 MG/ML IJ SOLN
INTRAMUSCULAR | Status: AC
  Administered 2022-03-28: 0.4 mg via INTRAVENOUS
  Filled 2022-03-28: qty 1

## 2022-03-28 MED ORDER — SODIUM CHLORIDE 0.9 % IV BOLUS
1000.0000 mL | Freq: Once | INTRAVENOUS | Status: AC
  Administered 2022-03-28: 1000 mL via INTRAVENOUS

## 2022-03-28 MED ORDER — MORPHINE SULFATE (PF) 2 MG/ML IV SOLN: 1.0000 mg | INTRAVENOUS | Status: DC | PRN

## 2022-03-28 MED ORDER — METRONIDAZOLE 500 MG/100ML IV SOLN: 500.0000 mg | Freq: Two times a day (BID) | INTRAVENOUS | Status: DC

## 2022-03-28 MED ORDER — FAMOTIDINE IN NACL 20-0.9 MG/50ML-% IV SOLN
20.0000 mg | INTRAVENOUS | Status: DC
  Administered 2022-03-28 – 2022-04-04 (×8): 20 mg via INTRAVENOUS
  Filled 2022-03-28 (×7): qty 50

## 2022-03-28 MED ORDER — POTASSIUM CHLORIDE 10 MEQ/100ML IV SOLN
10.0000 meq | INTRAVENOUS | Status: AC
  Administered 2022-03-28: 10 meq via INTRAVENOUS
  Filled 2022-03-28 (×2): qty 100

## 2022-03-28 MED ORDER — POTASSIUM CHLORIDE IN NACL 20-0.9 MEQ/L-% IV SOLN
INTRAVENOUS | Status: DC
  Filled 2022-03-28 (×3): qty 1000

## 2022-03-28 MED ORDER — SODIUM CHLORIDE 0.9 % IV BOLUS
500.0000 mL | Freq: Once | INTRAVENOUS | Status: AC | PRN
  Administered 2022-03-28: 500 mL via INTRAVENOUS

## 2022-03-28 MED ORDER — MAGNESIUM SULFATE IN D5W 1-5 GM/100ML-% IV SOLN
1.0000 g | Freq: Once | INTRAVENOUS | Status: AC
  Administered 2022-03-28: 1 g via INTRAVENOUS
  Filled 2022-03-28: qty 100

## 2022-03-28 MED ORDER — FENTANYL CITRATE PF 50 MCG/ML IJ SOSY
12.5000 ug | PREFILLED_SYRINGE | INTRAMUSCULAR | Status: DC | PRN
  Administered 2022-03-28: 12.5 ug via INTRAVENOUS
  Administered 2022-03-29 – 2022-03-30 (×8): 25 ug via INTRAVENOUS
  Filled 2022-03-28 (×10): qty 1

## 2022-03-28 MED ORDER — ALBUMIN HUMAN 25 % IV SOLN
25.0000 g | Freq: Once | INTRAVENOUS | Status: AC
  Administered 2022-03-28: 25 g via INTRAVENOUS
  Filled 2022-03-28: qty 100

## 2022-03-28 MED ORDER — LACTATED RINGERS IV BOLUS
500.0000 mL | Freq: Once | INTRAVENOUS | Status: AC
  Administered 2022-03-29: 500 mL via INTRAVENOUS

## 2022-03-28 MED ORDER — METHOCARBAMOL 1000 MG/10ML IJ SOLN: 1000.0000 mg | Freq: Four times a day (QID) | INTRAVENOUS | Status: DC | PRN

## 2022-03-28 MED ORDER — SODIUM CHLORIDE 0.9 % IV SOLN: 2.0000 g | INTRAVENOUS | Status: DC

## 2022-03-28 MED ORDER — NALOXONE HCL 0.4 MG/ML IJ SOLN: 0.4000 mg | INTRAMUSCULAR | Status: DC | PRN

## 2022-03-28 MED ORDER — NOREPINEPHRINE 4 MG/250ML-% IV SOLN
2.0000 ug/min | INTRAVENOUS | Status: DC
  Administered 2022-03-28: 2 ug/min via INTRAVENOUS
  Administered 2022-03-29: 6 ug/min via INTRAVENOUS
  Filled 2022-03-28 (×2): qty 250

## 2022-03-28 MED ORDER — GADOBUTROL 1 MMOL/ML IV SOLN
5.0000 mL | Freq: Once | INTRAVENOUS | Status: AC | PRN
  Administered 2022-03-28: 5 mL via INTRAVENOUS

## 2022-03-28 MED ORDER — LACTATED RINGERS IV SOLN: INTRAVENOUS | Status: DC

## 2022-03-28 MED ORDER — ALUM & MAG HYDROXIDE-SIMETH 200-200-20 MG/5ML PO SUSP: 30.0000 mL | Freq: Four times a day (QID) | ORAL | Status: DC | PRN

## 2022-03-28 MED ORDER — MAGIC MOUTHWASH: 15.0000 mL | Freq: Four times a day (QID) | ORAL | Status: DC | PRN

## 2022-03-28 MED ORDER — ORAL CARE MOUTH RINSE: 15.0000 mL | OROMUCOSAL | Status: DC | PRN

## 2022-03-28 MED ORDER — SODIUM CHLORIDE 0.9 % IV SOLN
1.0000 g | Freq: Once | INTRAVENOUS | Status: AC
  Administered 2022-03-28: 1 g via INTRAVENOUS
  Filled 2022-03-28: qty 10

## 2022-03-28 MED ORDER — ONDANSETRON HCL 4 MG/2ML IJ SOLN
4.0000 mg | Freq: Four times a day (QID) | INTRAMUSCULAR | Status: DC | PRN
  Administered 2022-03-29: 4 mg via INTRAVENOUS
  Filled 2022-03-28: qty 2

## 2022-03-28 MED ORDER — LACTATED RINGERS IV BOLUS
500.0000 mL | Freq: Once | INTRAVENOUS | Status: AC
  Administered 2022-03-28: 500 mL via INTRAVENOUS

## 2022-03-28 MED ORDER — POLYETHYLENE GLYCOL 3350 17 G PO PACK
17.0000 g | PACK | Freq: Every day | ORAL | Status: DC
  Administered 2022-04-01 – 2022-04-07 (×2): 17 g via ORAL
  Filled 2022-03-28 (×6): qty 1

## 2022-03-28 NOTE — Progress Notes (Signed)
eLink Physician-Brief Progress Note Patient Name: Lauren [name] DOB: Sep 16, 1940 MRN: [id number]   Date of Service  03/28/2022  HPI/Events of Note  89 F hx hypertension, DM, dyslipidemia, hypothyroidism, presented with 1 week history of abdominal pain with poor oral intake. On workup cholangitis vs cholecystitis. GI and surgery consulted. Episode of hypotension given fluids now norepinephrine.  See awake not in acute distress  eICU Interventions  Plan for ERCP in AM. Antibiotics escalated to piperacillin-tazobactam from ceftriaxone Norepinephrine running peripherally for now. Will have to monitor pressor requirement while ongoing fluid bolus and albumin. If pressor requirement persistent will need a central line. Discussed with bedside RN     Intervention Category Evaluation Type: New Patient Evaluation  Judd Lien 03/28/2022, 10:21 PM

## 2022-03-28 NOTE — Progress Notes (Signed)
eLink Physician-Brief Progress Note Patient Name: Lauren [name] DOB: Aug 19, 1940 MRN: [id number]   Date of Service  03/28/2022  HPI/Events of Note  Pt BP 80/43 (54) on peripheral Levo @ 9. Seen asleep with current BP 98/48  HR 103 Received 1.5 liters crystalloids and 120 ml albumin 25%  eICU Interventions  Ordered another 500 cc LR bolus eLink to be informed if continues to have increased pressor requirement despite fluids as patient will need a central line Will inform bedside CCM team as well     Intervention Category Intermediate Interventions: Hypotension - evaluation and management  Judd Lien 03/28/2022, 11:52 PM

## 2022-03-28 NOTE — Progress Notes (Signed)
[facility] admitting physician addendum:  The patient was reevaluated due to persistent hypotension in the 70s/40s and 70s/50s mmHg with tachycardia in the low 100s despite IVF boluses, 1000 mL just finished and 500 mL earlier today.  Antibiotics were changed to Zosyn earlier.  Another bolus of LR 500 mL ordered.  Stat lactic acid, CBC and CMP along with albumin 25 g IVPB ordered.  I have placed orders for transfer to the stepdown unit, but also consulted PCCM (Dr. Erin Fulling) because she might need pressor support.  I called her son Barnabas Lister and left a HIPAA compliant message to update him about her status.  Tennis Must, MD.

## 2022-03-28 NOTE — Consult Note (Signed)
Reason for Consult: Elevated liver enzymes and dilated CBD at 10 mm Referring Physician: [facility]  Lauren [name] HPI: This is an 82 year old female with multiple medical problems admitted for complaints of severe epigastric pain.  Her pain started acutely after eating some rice and bacon.  Her symptoms were not relieved with over the counter medications and she was then brought to the hospital.  Evaluation in the ER showed that she had a marked elevation in her liver enzymes:  AST 1803, ALT 546, AP 194, and TB 1.3.  Today her liver enzymes show an increase in her ALT to 752, but a declin of her AST and AP to 1332 and 176, respectively.  The TB did increase up to 3.4.  Imaging with a CT angio was negative for any evidence of vascular source of her pain, but she was noted to have hepatic steatosis.  There was no report of any biliary ductal dilation.  An ultrasound was performed and it reported sludge and a dilated CBD at 10 mm with sludge.  The current MRCP is pending at this time.  Her acetaminophen level was <10 and acute viral hepatidities were negative for HAV, HBV, and HCV.  With the CTA no vascular issues were noted in the liver.  The patient's son reports that on the same day she was taking some type of supplement from [employer] her pain was very severe, but now her pain has improved greatly.  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   Possible related to pyridium  G6PD studies pending   Hemorrhoids    Hiatal hernia    Hyperlipidemia    Hyperlipidemia    Hypertension    Hypothyroidism    IBS (irritable bowel syndrome)    Iron overload 07/13/2016   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   Osteoporosis    UTI (lower urinary tract infection)     Vitamin D deficiency     Past Surgical History:  Procedure Laterality Date   COLONOSCOPY     PARTIAL KNEE ARTHROPLASTY Left 02/25/2020   Procedure: UNICOMPARTMENTAL KNEE;  Surgeon: Gaynelle Arabian, MD;  Location: [facility];  Service: Orthopedics;  Laterality: Left;  79mn    Family History  Problem Relation Age of Onset   Colon cancer Neg Hx     Social History:  reports that she has never smoked. She has never used smokeless tobacco. She reports that she does not drink alcohol and does not use drugs.  Allergies:  Allergies  Allergen Reactions   Pyridium [Phenazopyridine Hcl] Other (See Comments)    Hemolytic anemia   Sulfa Antibiotics     Potential allergy: oxidizing drug: methemoglobinemia/hemolysis on pyridium   Ciprofloxacin Hcl     Sx's almost like a heart attack   Esomeprazole Magnesium Nausea Only    Pain and nausea   Macrodantin [Nitrofurantoin Macrocrystal] Other (See Comments)    GI Upset   Pantoprazole Sodium Nausea Only    abd pain and nausea   Tramadol Nausea Only    Medications: Scheduled: Continuous:  0.9 % NaCl with KCl 20 mEq / L     [START ON 03/29/2022] cefTRIAXone (ROCEPHIN)  IV  famotidine (PEPCID) IV     magnesium sulfate bolus IVPB     metronidazole     potassium chloride      Results for orders placed or performed during the hospital encounter of 03/27/22 (from the past 24 hour(s))  Lipase, blood     Status: Abnormal   Collection Time: 03/27/22  7:45 PM  Result Value Ref Range   Lipase 61 (H) 11 - 51 U/L  Comprehensive metabolic panel     Status: Abnormal   Collection Time: 03/27/22  7:45 PM  Result Value Ref Range   Sodium 131 (L) 135 - 145 mmol/L   Potassium 3.4 (L) 3.5 - 5.1 mmol/L   Chloride 97 (L) 98 - 111 mmol/L   CO2 22 22 - 32 mmol/L   Glucose, Bld 156 (H) 70 - 99 mg/dL   BUN 17 8 - 23 mg/dL   Creatinine, Ser 0.52 0.44 - 1.00 mg/dL   Calcium 9.1 8.9 - 10.3 mg/dL   Total Protein 6.9 6.5 - 8.1 g/dL   Albumin 3.6 3.5 - 5.0 g/dL    AST 1,803 (H) 15 - 41 U/L   ALT 546 (H) 0 - 44 U/L   Alkaline Phosphatase 194 (H) 38 - 126 U/L   Total Bilirubin 1.3 (H) 0.3 - 1.2 mg/dL   GFR, Estimated >60 >60 mL/min   Anion gap 12 5 - 15  CBC     Status: Abnormal   Collection Time: 03/27/22  7:45 PM  Result Value Ref Range   WBC 5.2 4.0 - 10.5 K/uL   RBC 3.53 (L) 3.87 - 5.11 MIL/uL   Hemoglobin 10.7 (L) 12.0 - 15.0 g/dL   HCT 31.6 (L) 36.0 - 46.0 %   MCV 89.5 80.0 - 100.0 fL   MCH 30.3 26.0 - 34.0 pg   MCHC 33.9 30.0 - 36.0 g/dL   RDW 14.6 11.5 - 15.5 %   Platelets 218 150 - 400 K/uL   nRBC 0.0 0.0 - 0.2 %  Urinalysis, Routine w reflex microscopic Urine, Catheterized     Status: Abnormal   Collection Time: 03/27/22  7:45 PM  Result Value Ref Range   Color, Urine YELLOW YELLOW   APPearance HAZY (A) CLEAR   Specific Gravity, Urine 1.015 1.005 - 1.030   pH 8.5 (H) 5.0 - 8.0   Glucose, UA NEGATIVE NEGATIVE mg/dL   Hgb urine dipstick NEGATIVE NEGATIVE   Bilirubin Urine NEGATIVE NEGATIVE   Ketones, ur 15 (A) NEGATIVE mg/dL   Protein, ur 30 (A) NEGATIVE mg/dL   Nitrite POSITIVE (A) NEGATIVE   Leukocytes,Ua NEGATIVE NEGATIVE  Troponin I (High Sensitivity)     Status: None   Collection Time: 03/27/22  7:45 PM  Result Value Ref Range   Troponin I (High Sensitivity) 7 <18 ng/L  Lactic acid, plasma     Status: Abnormal   Collection Time: 03/27/22  7:45 PM  Result Value Ref Range   Lactic Acid, Venous 2.1 (HH) 0.5 - 1.9 mmol/L  Urinalysis, Microscopic (reflex)     Status: Abnormal   Collection Time: 03/27/22  7:45 PM  Result Value Ref Range   RBC / HPF 0-5 0 - 5 RBC/hpf   WBC, UA 0-5 0 - 5 WBC/hpf   Bacteria, UA MANY (A) NONE SEEN   Squamous Epithelial / HPF 0-5 0 - 5 /HPF   WBC Clumps PRESENT   Troponin I (High Sensitivity)     Status: None   Collection Time: 03/27/22  9:40 PM  Result Value Ref Range   Troponin I (High Sensitivity) 7 <18 ng/L  Lactic acid, plasma     Status: None   Collection Time: 03/27/22  9:40 PM   Result Value Ref Range   Lactic Acid, Venous 1.4 0.5 - 1.9 mmol/L  Hepatitis panel, acute     Status: None   Collection Time: 03/27/22  9:50 PM  Result Value Ref Range   Hepatitis B Surface Ag NON REACTIVE NON REACTIVE   HCV Ab NON REACTIVE NON REACTIVE   Hep A IgM NON REACTIVE NON REACTIVE   Hep B C IgM NON REACTIVE NON REACTIVE  Acetaminophen level     Status: Abnormal   Collection Time: 03/27/22  9:50 PM  Result Value Ref Range   Acetaminophen (Tylenol), Serum <10 (L) 10 - 30 ug/mL  Gamma GT     Status: Abnormal   Collection Time: 03/27/22  9:50 PM  Result Value Ref Range   GGT 415 (H) 7 - 50 U/L  Lactate dehydrogenase     Status: Abnormal   Collection Time: 03/27/22  9:50 PM  Result Value Ref Range   LDH 1,637 (H) 98 - 192 U/L  Glucose, capillary     Status: Abnormal   Collection Time: 03/28/22 10:56 AM  Result Value Ref Range   Glucose-Capillary 191 (H) 70 - 99 mg/dL  Comprehensive metabolic panel     Status: Abnormal   Collection Time: 03/28/22 11:29 AM  Result Value Ref Range   Sodium 133 (L) 135 - 145 mmol/L   Potassium 2.9 (L) 3.5 - 5.1 mmol/L   Chloride 98 98 - 111 mmol/L   CO2 20 (L) 22 - 32 mmol/L   Glucose, Bld 197 (H) 70 - 99 mg/dL   BUN 25 (H) 8 - 23 mg/dL   Creatinine, Ser 1.00 0.44 - 1.00 mg/dL   Calcium 8.3 (L) 8.9 - 10.3 mg/dL   Total Protein 5.7 (L) 6.5 - 8.1 g/dL   Albumin 3.1 (L) 3.5 - 5.0 g/dL   AST 1,332 (H) 15 - 41 U/L   ALT 752 (H) 0 - 44 U/L   Alkaline Phosphatase 176 (H) 38 - 126 U/L   Total Bilirubin 3.4 (H) 0.3 - 1.2 mg/dL   GFR, Estimated 56 (L) >60 mL/min   Anion gap 15 5 - 15  CBC     Status: Abnormal   Collection Time: 03/28/22 11:29 AM  Result Value Ref Range   WBC 9.7 4.0 - 10.5 K/uL   RBC 3.50 (L) 3.87 - 5.11 MIL/uL   Hemoglobin 10.9 (L) 12.0 - 15.0 g/dL   HCT 32.7 (L) 36.0 - 46.0 %   MCV 93.4 80.0 - 100.0 fL   MCH 31.1 26.0 - 34.0 pg   MCHC 33.3 30.0 - 36.0 g/dL   RDW 15.0 11.5 - 15.5 %   Platelets 118 (L) 150 - 400 K/uL    nRBC 0.0 0.0 - 0.2 %  Lipase, blood     Status: Abnormal   Collection Time: 03/28/22 11:29 AM  Result Value Ref Range   Lipase 93 (H) 11 - 51 U/L  Ammonia     Status: None   Collection Time: 03/28/22 11:29 AM  Result Value Ref Range   Ammonia 26 9 - 35 umol/L  Magnesium     Status: None   Collection Time: 03/28/22 11:29 AM  Result Value Ref Range   Magnesium 1.9 1.7 - 2.4 mg/dL  Phosphorus     Status: None  Collection Time: 03/28/22 11:29 AM  Result Value Ref Range   Phosphorus 3.8 2.5 - 4.6 mg/dL     US Abdomen Limited RUQ (LIVER/GB)  Result Date: 03/28/2022 CLINICAL DATA:  Acute severe epigastric pain. Elevated liver function tests. EXAM: ULTRASOUND ABDOMEN LIMITED RIGHT UPPER QUADRANT COMPARISON:  CTA on 03/27/2022 FINDINGS: Gallbladder: Small amount of echogenic sludge seen within the gallbladder, however no definite gallstones identified. Mild diffuse gallbladder wall thickening is seen measuring to 5 mm. No sonographic Murphy sign noted by sonographer. Common bile duct: Diameter: 10 mm, which is mildly dilated. Echogenic sludge noted within the common bile duct, however no definite intraductal calculi are seen by ultrasound. No evidence of intrahepatic biliary ductal dilatation. Liver: Mildly increased echogenicity of the hepatic parenchyma, consistent with hepatic steatosis. No hepatic mass identified. Portal vein is patent on color Doppler imaging with normal direction of blood flow towards the liver. Other: None. IMPRESSION: Small amount of gallbladder sludge, however no definite gallstones identified. Mild diffuse gallbladder wall thickening, which is nonspecific. This could be secondary to hepatocellular disease, although cholecystitis cannot be excluded. Recommend clinical correlation and consider nuclear medicine hepatobiliary scan if clinically warranted. Mild dilatation of common bile duct measuring 10 mm, which contains sludge. Consider further evaluation with abdomen MRI and  MRCP without and with contrast to evaluate for choledocholithiasis if clinically warranted. Mild hepatic steatosis. Electronically Signed   By: Marlaine Hind M.D.   On: 03/28/2022 10:04   CT Angio Chest/Abd/Pel for Dissection W and/or Wo Contrast  Result Date: 03/27/2022 CLINICAL DATA:  Acute aortic syndrome (AAS) suspected severe epigastric pain EXAM: CT ANGIOGRAPHY CHEST, ABDOMEN AND PELVIS TECHNIQUE: Non-contrast CT of the chest was initially obtained. Multidetector CT imaging through the chest, abdomen and pelvis was performed using the standard protocol during bolus administration of intravenous contrast. Multiplanar reconstructed images and MIPs were obtained and reviewed to evaluate the vascular anatomy. RADIATION DOSE REDUCTION: This exam was performed according to the departmental dose-optimization program which includes automated exposure control, adjustment of the mA and/or kV according to patient size and/or use of iterative reconstruction technique. CONTRAST:  150m OMNIPAQUE IOHEXOL 350 MG/ML SOLN COMPARISON:  11/21/2020 FINDINGS: CTA CHEST FINDINGS Cardiovascular: Heart is normal size. Aorta is normal caliber. No evidence of aortic dissection. No pulmonary embolus. Scattered coronary artery and aortic calcifications. Mediastinum/Nodes: Calcified mediastinal lymph nodes. No mediastinal, hilar, or axillary adenopathy. Trachea and esophagus are unremarkable. Thyroid unremarkable. Lungs/Pleura: Lungs are clear. No focal airspace opacities or suspicious nodules. No effusions. Musculoskeletal: Chest wall soft tissues are unremarkable. No acute bony abnormality. Review of the MIP images confirms the above findings. CTA ABDOMEN AND PELVIS FINDINGS VASCULAR Aorta: Aortic calcifications.  No aneurysm or dissection. Celiac: Mild narrowing at the origin.  No aneurysm or dissection. SMA: Widely patent Renals: Widely patent IMA: Widely patent Inflow: Atherosclerotic calcifications.  No aneurysm or dissection.  Veins: No obvious venous abnormality within the limitations of this arterial phase study. Review of the MIP images confirms the above findings. NON-VASCULAR Hepatobiliary: Low-density throughout the liver compatible with fatty infiltration. Gallbladder unremarkable. No biliary ductal dilatation. Pancreas: No focal abnormality or ductal dilatation. Spleen: Calcifications throughout the spleen compatible with old granulomatous disease. Normal size. Adrenals/Urinary Tract: No adrenal abnormality. No focal renal abnormality. No stones or hydronephrosis. Urinary bladder is unremarkable. Stomach/Bowel: Stomach, large and small bowel grossly unremarkable. Lymphatic: No adenopathy Reproductive: Uterus and adnexa unremarkable.  No mass. Other: No free fluid or free air. Musculoskeletal: No acute bony abnormality. Review of the  MIP images confirms the above findings. IMPRESSION: No evidence of aortic aneurysm or dissection. Aortic atherosclerosis. Old granulomatous disease. No acute findings in the chest, abdomen or pelvis. Hepatic steatosis. Electronically Signed   By: Rolm Baptise M.D.   On: 03/27/2022 21:14    ROS:  As stated above in the HPI otherwise negative.  Blood pressure (!) 102/53, pulse 100, temperature 98 F (36.7 C), temperature source Oral, resp. rate 17, SpO2 97 %.    PE: Gen: NAD, Alert and Oriented HEENT:  [place]/AT, EOMI Neck: Supple, no LAD Lungs: CTA Bilaterally CV: RRR without M/G/R ABD: Soft, mild epigastric tenderness, +BS Ext: No C/C/E  Assessment/Plan: 1) Abnormal liver enzymes. 2) Cholelithiasis. 3) Choledocholithiasis.   The liver enzymes are markedly elevated and the MRCP does show sludge in the CBD.  It is not typical for the liver enzymes to be this high, but the imaging and clinical presentation are consistent with a biliary issue.  Plan: 1) ERCP tomorrow with Dr. Carlean Purl. 2) Continue with Zosyn.  Kynadee Dam D 03/28/2022, 1:35 PM

## 2022-03-28 NOTE — Progress Notes (Signed)
Paged [facility] and consults. Patient just arrived to unit from [place]. On assessment, patient is slow to respond to commands. Called Rapid response. Waiting on further orders.

## 2022-03-28 NOTE — H&P (Signed)
History and Physical    Patient: Lauren [name] SWN:[id number] DOB: [birth year] DOA: 03/27/2022 DOS: the patient was seen and examined on 03/28/2022 PCP: Jani Gravel, MD  Patient coming from: Home  Chief Complaint:  Chief Complaint  Patient presents with   Abdominal Pain   HPI: Lauren [name] is a 82 y.o. female with medical history significant of seasonal allergies, aortic atherosclerosis, hyperlipidemia, osteoarthritis, type 2 diabetes, diverticulosis, dyspepsia, fecal incontinence, gastritis, GERD, hiatal hernia, history of hemolytic anemia, hemorrhoids, hypertension, hypothyroidism, iron overload, microcytic anemia, osteoporosis, UTI, vitamin D deficiency who presented with her son to the emergency department yesterday evening due to epigastric abdominal pain that started earlier in the afternoon after she ate a small piece of bacon and rice.  Her son went to the pharmacy and got her some Pepcid tablets, from which she took 2 without significant relief.  So subsequently he took her to the emergency department.  No nausea or vomiting.    No diarrhea, constipation, melena or hematochezia.  No flank pain, dysuria, frequency or hematuria. No fever, chills or night sweats. No sore throat, rhinorrhea, dyspnea, wheezing or hemoptysis.  No chest pain, palpitations, diaphoresis, PND, orthopnea or pitting edema of the lower extremities. No polyuria, polydipsia, polyphagia or blurred vision.  When the patient arrived to this facility a rapid response was called due to unresponsiveness.  She was only responding to sternal rub.  She responded to Narcan 0.4 mg IVP.  She was same pain afterwards.  Toradol 7.5 mg and Pepcid 20 mg IVP x 1 each order.  The patient fell back to sleep after around 45 minutes.  ETCO2 monitoring ordered.  ED course: Initial vital signs were temperature 97.9 F, pulse 81, respiration 20, BP 153/62 mmHg O2 sat 100% on room air.  The patient received ceftriaxone 1 g IVPB, hydromorphone 0.5 mg  IVP x 2, morphine 4 mg IVP, LR 1000 mL IVP.  Lab work: Her urinalysis was hazy with an elevated pH at 8.5, ketonuria 15, proteinuria 30 mg deciliter and positive nitrites.  There were many bacteria microscopic examination but no significant WBCs.  CBC showed a white count of 5.2, hemoglobin 10.7 g/dL platelets 218.  Lipase was 61.  Lactic acid 2.1 and then 1.4 mmol/L.  Troponin x 2 normal.  CMP showed a sodium 131, potassium 3.4, chloride 97 and CO2 22 mmol/L.  Renal function was normal.  Glucose 156 and total bilirubin 1.3 mg/dL.  AST was 1800 03, ALT 546 and alkaline phosphatase 194 units/L.  GGT 415 and LDH 1637 units/L.  Total protein 6.9 and albumin 3.6 g/dL.  Imaging: CTA chest/abdomen/pelvis with no evidence of aortic aneurysm or dissection.  There is aortic atherosclerosis.  There is hepatic steatosis.  Old granulomatous disease in the spleen, no PE or any other acute finding in the chest, abdomen or pelvis.   Review of Systems: As mentioned in the history of present illness. All other systems reviewed and are negative.  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   Possible related to pyridium  G6PD studies pending   Hemorrhoids    Hiatal hernia    Hyperlipidemia    Hyperlipidemia  Hypertension    Hypothyroidism    IBS (irritable bowel syndrome)    Iron overload 07/13/2016   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   Osteoporosis    UTI (lower urinary tract infection)    Vitamin D deficiency    Past Surgical History:  Procedure Laterality Date   COLONOSCOPY     PARTIAL KNEE ARTHROPLASTY Left 02/25/2020   Procedure: UNICOMPARTMENTAL KNEE;  Surgeon: Gaynelle Arabian, MD;  Location: [facility];  Service: Orthopedics;  Laterality: Left;  58mn   Social  History:  reports that she has never smoked. She has never used smokeless tobacco. She reports that she does not drink alcohol and does not use drugs.  Allergies  Allergen Reactions   Pyridium [Phenazopyridine Hcl] Other (See Comments)    Hemolytic anemia   Sulfa Antibiotics     Potential allergy: oxidizing drug: methemoglobinemia/hemolysis on pyridium   Ciprofloxacin Hcl     Sx's almost like a heart attack   Esomeprazole Magnesium Nausea Only    Pain and nausea   Macrodantin [Nitrofurantoin Macrocrystal] Other (See Comments)    GI Upset   Pantoprazole Sodium Nausea Only    abd pain and nausea   Tramadol Nausea Only    Family History  Problem Relation Age of Onset   Colon cancer Neg Hx     Prior to Admission medications   Medication Sig Start Date End Date Taking? Authorizing Provider  ascorbic acid (VITAMIN C) 500 MG tablet Take 500 mg by mouth daily.    [provider]  cefdinir (OMNICEF) 300 MG capsule Take 1 capsule (300 mg total) by mouth 2 (two) times daily. 11/21/20   RQuintella Reichert MD  Cholecalciferol (VITAMIN D3) 2000 UNITS TABS Take 1 tablet by mouth daily.    [provider]  COVID-19 mRNA Vac-TriS, Pfizer, SUSP injection Inject into the muscle. 09/23/20   SCarlyle Basques MD  glucosamine-chondroitin 500-400 MG tablet Take 1 tablet by mouth 3 (three) times daily.    [provider]  glucose blood (ACCU-CHEK AVIVA PLUS) test strip Use to check blood sugar 3 times daily. 01/20/22     levothyroxine (SYNTHROID, LEVOTHROID) 75 MCG tablet Take 75 mcg by mouth daily.    [provider]  losartan (COZAAR) 100 MG tablet Take 100 mg by mouth daily.    [provider]  methocarbamol (ROBAXIN) 500 MG tablet Take 1 tablet (500 mg total) by mouth every 6 (six) hours as needed for muscle spasms. 02/26/20   Edmisten, KOk Anis PA  Multiple Vitamin (MULTIVITAMIN) tablet Take 1 tablet by mouth daily.    [provider]  Omega-3 Fatty  Acids (FISH OIL) 500 MG CAPS Take 500 mg by mouth 2 (two) times daily.    [provider]  omeprazole (PRILOSEC) 40 MG capsule Take 1 capsule (40 mg total) by mouth daily. 03/12/15   Pyrtle, JLajuan Lines MD  ondansetron (ZOFRAN-ODT) 4 MG disintegrating tablet Take 1 tablet (4 mg total) by mouth every 8 (eight) hours as needed for nausea or vomiting. 09/15/21   White, ALeitha Schuller NP  oxyCODONE (OXY IR/ROXICODONE) 5 MG immediate release tablet Take 1-2 tablets (5-10 mg total) by mouth every 6 (six) hours as needed for severe pain. 02/26/20   Edmisten, Kristie L, PA  pravastatin (PRAVACHOL) 40 MG tablet Take 40 mg by mouth daily. 01/12/20   [provider]  sitaGLIPtan-metformin (JANUMET) 50-500 MG per tablet Take 1 tablet by mouth 2 (two) times daily with a meal.    [provider]  vitamin B-12 (CYANOCOBALAMIN) 100 MCG tablet Take 100 mcg by mouth daily.    [provider]    Physical Exam: Vitals:   03/28/22 0806 03/28/22 0830 03/28/22 0900 03/28/22 0915  BP: (!) 99/52 (!) 98/52 (!) 102/53   Pulse: (!) 102 (!) 101 98 100  Resp: '20 18 16 17  '$ Temp:      TempSrc:      SpO2: 95% 97% 97% 97%   Physical Exam Vitals and nursing note reviewed.  Constitutional:      General: She is awake.     Appearance: She is well-developed.  HENT:     Head: Normocephalic.     Nose: No rhinorrhea.     Mouth/Throat:     Mouth: Mucous membranes are dry.  Eyes:     General: Scleral icterus present.     Pupils: Pupils are equal, round, and reactive to light.  Neck:     Vascular: No JVD.  Cardiovascular:     Rate and Rhythm: Normal rate and regular rhythm.     Heart sounds: S1 normal and S2 normal.  Pulmonary:     Effort: Pulmonary effort is normal.     Breath sounds: Normal breath sounds. No wheezing, rhonchi or rales.  Abdominal:     General: There is no distension.     Palpations: Abdomen is soft. There is no hepatomegaly.     Tenderness: There is abdominal tenderness in  the right upper quadrant and epigastric area. There is right CVA tenderness. There is no guarding or rebound.  Musculoskeletal:     Cervical back: Neck supple.     Right lower leg: No edema.     Left lower leg: No edema.  Skin:    General: Skin is warm and dry.  Neurological:     General: No focal deficit present.     Mental Status: She is alert.  Psychiatric:        Mood and Affect: Mood normal.        Behavior: Behavior normal. Behavior is cooperative.   Data Reviewed:  Results are pending, will review when available.  Assessment and Plan: Principal Problem:   Hepatitis Observation/telemetry. Keep n.p.o. for now. Continue IV fluids. Antiemetics as needed. Continue famotidine 20 mg IVPB daily. Low-dose analgesics as needed. Monitor end-tidal CO2. Obtain MRCP. Monitor CBC, electrolytes, hepatic and renal function. GI consult has been requested.  Active Problems:   Iron deficiency anemia Monitor hematocrit and hemoglobin. Transfuse as needed.    Hypokalemia Replacing. Magnesium has been supplemented. Follow-up potassium level.    HTN (hypertension) Blood pressures have been soft. Hold antihypertensives. Monitor blood pressure.    Hyperlipidemia Hold pravastatin for now.    Type 2 diabetes mellitus (HCC) Currently NPO. CBG monitoring every 6 hours. Continue IV fluids.    GERD (gastroesophageal reflux disease) On famotidine IV.    Hypothyroidism Resume levothyroxine once cleared for oral intake. Resume levothyroxine IV tomorrow if still NPO.     Advance Care Planning:   Code Status: Full code.   Consults: Carol Ada, MD who is covering for [facility].  Family Communication: Her son Barnabas Lister was at bedside.   Severity of Illness: The appropriate patient status for this patient is OBSERVATION. Observation status is judged to be reasonable and necessary in order to provide the required intensity of service to ensure the patient's safety. The patient's  presenting symptoms, physical exam findings, and initial radiographic and laboratory data in the context of their medical condition is  felt to place them at decreased risk for further clinical deterioration. Furthermore, it is anticipated that the patient will be medically stable for discharge from the hospital within 2 midnights of admission.   Author: Reubin Milan, MD 03/28/2022 11:08 AM  For on call review [email or web address].   This document was prepared using Dragon voice recognition software and may contain some unintended transcription errors.

## 2022-03-28 NOTE — Progress Notes (Signed)
   03/28/22 1550  Assess: MEWS Score  Temp 98.1 F (36.7 C)  BP (!) 76/47  MAP (mmHg) (!) 57  Pulse Rate (!) 103  Resp 16  SpO2 99 %  O2 Device Nasal Cannula  Assess: MEWS Score  MEWS Temp 0  MEWS Systolic 2  MEWS Pulse 1  MEWS RR 0  MEWS LOC 0  MEWS Score 3  MEWS Score Color Yellow  Assess: if the MEWS score is Yellow or Red  Were vital signs taken at a resting state? Yes  Focused Assessment Change from prior assessment (see assessment flowsheet)  Does the patient meet 2 or more of the SIRS criteria? No  MEWS guidelines implemented *See Row Information* Yes  Take Vital Signs  Increase Vital Sign Frequency  Yellow: Q 2hr X 2 then Q 4hr X 2, if remains yellow, continue Q 4hrs  Escalate  MEWS: Escalate Yellow: discuss with charge nurse/RN and consider discussing with provider and RRT  Notify: Charge Nurse/RN  Name of Charge Nurse/RN Notified Anda Kraft, RN  Date Charge Nurse/RN Notified 03/28/22  Time Charge Nurse/RN Notified 1550  Provider Notification  Provider Name/Title Dr. Tennis Must, MD  Date Provider Notified 03/28/22  Time Provider Notified 1555  Method of Notification Page  Notification Reason Critical Result  Provider response See new orders;At bedside (1,000 ml normal saline bolus)  Date of Provider Response 03/28/22  Time of Provider Response 1556  Assess: SIRS CRITERIA  SIRS Temperature  0  SIRS Pulse 1  SIRS Respirations  0  SIRS WBC 0  SIRS Score Sum  1

## 2022-03-28 NOTE — ED Notes (Signed)
Report given to [facility]. 

## 2022-03-28 NOTE — Progress Notes (Addendum)
Patient has been transferred to ICU stepdown. Report given to Liliane Shi, RN.

## 2022-03-28 NOTE — ED Notes (Signed)
Son's phone number: [phone]

## 2022-03-28 NOTE — Progress Notes (Signed)
   03/28/22 1950  Provider Notification  Provider Name/Title Clarene Essex NP  Date Provider Notified 03/28/22  Time Provider Notified 2000  Method of Notification Face-to-face  Notification Reason Critical Result  Test performed and critical result Lactic 2.6  Date Critical Result Received 03/28/22  Time Critical Result Received 1950  Provider response En route  Date of Provider Response 03/28/22  Time of Provider Response 2000

## 2022-03-28 NOTE — Significant Event (Signed)
Rapid Response Event Note   Reason for Call :  Patient minimally response and hypotensive   Initial Focused Assessment:  Patient just arrived to unit from [facility] via [facility]. Patient requiring interpreter to communicate. Patient only responsive to painful stimuli. Upon chart review it was noted patient LFT's are very elevated. Patient also hypotensive 86/52, pulse 104. Patient received 0.'5mg'$  Dilaudid IVP  Interventions:  527m Nacl bolus  0.4 Narcan IVP Checked CBG Placed patient on Tele monitor  Plan of Care:  Repeat Labs  Cardiac monitoring  Event Summary:  After administering 0.4 Narcan patient alert and oriented X4, complaining on abdominal pain. MD OOlevia Bowensat bedside, placing orders for different pain meds. Patient BP 96/56 after Nacl bolus.   MD Notified: DTennis MustMD Call Time:1019 Arrival Time:1035 End Time: 11:00  EMirna Mires RN

## 2022-03-28 NOTE — Plan of Care (Signed)
  Problem: Pain Managment: Goal: General experience of comfort will improve Outcome: Progressing

## 2022-03-28 NOTE — Consult Note (Signed)
NAME:  Lauren [name], MRN:  [id number], DOB:  Mar 27, 1940, LOS: 0 ADMISSION DATE:  03/27/2022, CONSULTATION DATE:  03/28/22 REFERRING MD:  Dr. Olevia Bowens, CHIEF COMPLAINT:  abdominal pain   History of Present Illness:  82 year old woman admitted to hospital with abdominal pain, several days of poor p.o. intake found to have cholangitis versus cholecystitis.  History largely per son at bedside.  Several days of poor appetite.  Not eating very much.  Abdominal pain worsened after eating bacon.  Presented to the ED.  They are ultrasound with dilated bile duct, sludge, gallbladder thickening.  MRCP subsequently obtained.  This demonstrated concern for cholangitis versus cholecystitis.  Surgery and GI were consulted.  Felt cholecystitis was less likely, most likely cholangitis.  Plan for ERCP 1/15.  This afternoon, more hypotensive.  Not responding to fluids.  Transferred to the stepdown unit.  Labs notable for creatinine doubled.  LFTs essentially stable with rising T. bili.  Pertinent  Medical History  N/a  Significant Hospital Events: Including procedures, antibiotic start and stop dates in addition to other pertinent events   03/28/2021 admitted to the hospital after a week of poor p.o. intake, with abdominal pain, likely cholangitis versus cholecystitis  Interim History / Subjective:    Objective   Blood pressure (!) 71/44, pulse (!) 105, temperature 98.5 F (36.9 C), temperature source Oral, resp. rate 17, SpO2 98 %.        Intake/Output Summary (Last 24 hours) at 03/28/2022 2112 Last data filed at 03/28/2022 1800 Gross per 24 hour  Intake 1412.45 ml  Output 1000 ml  Net 412.45 ml   There were no vitals filed for this visit.  Examination: General: Thin, lying in bed Eyes: EOMI, mild icterus Lungs: Clear, no work of breathing Cardiovascular: Tachycardic, no murmur Abdomen: Tender to palpation epigastrium and right upper quadrant with voluntary guarding Skin: Jaundiced, no  lesions Neuro: Moves all extremities, no focal deficits  Resolved Hospital Problem list     Assessment & Plan:  Hypotension due to hypovolemia and septic shock: Lactic acid elevated.  Hypotensive despite fluid boluses.  Source of sepsis cholangitis.  Also hypovolemic on exam. -- Status post 2 L crystalloid, 25 g albumin ordered about to be infused -- Additional 1 L LR, 25 additional grams albumin given hypovolemia on exam -- Give IV fluids as needed, respiratory wise she looks fine and certainly looks very dry -- Norepinephrine peripherally, MAP goal greater than 65  Cholangitis: Appreciate surgery, GI assistance.  Cholangitis felt to be more likely than cholecystitis. -- Zosyn -- N.p.o. at midnight, plan ERCP 1/15 per GI note  Elevated LFTs: Related to cholangitis as above.  Acute renal failure: Creatinine doubled from baseline, poor urine output.  Due to presumed ATN in the setting of hypovolemia, septic shock, hypotension. --Fluids, pressors as above may  Best Practice (right click and "Reselect all SmartList Selections" daily)   Per primary  Labs   CBC: Recent Labs  Lab 03/27/22 1945 03/28/22 1129 03/28/22 1922  WBC 5.2 9.7 14.1*  HGB 10.7* 10.9* 11.4*  HCT 31.6* 32.7* 32.9*  MCV 89.5 93.4 90.6  PLT 218 118* 61*    Basic Metabolic Panel: Recent Labs  Lab 03/27/22 1945 03/28/22 1129  NA 131* 133*  K 3.4* 2.9*  CL 97* 98  CO2 22 20*  GLUCOSE 156* 197*  BUN 17 25*  CREATININE 0.52 1.00  CALCIUM 9.1 8.3*  MG  --  1.9  PHOS  --  3.8   GFR:  CrCl cannot be calculated (Unknown ideal weight.). Recent Labs  Lab 03/27/22 1945 03/27/22 2140 03/28/22 1129 03/28/22 1922  WBC 5.2  --  9.7 14.1*  LATICACIDVEN 2.1* 1.4  --  2.6*    Liver Function Tests: Recent Labs  Lab 03/27/22 1945 03/28/22 1129  AST 1,803* 1,332*  ALT 546* 752*  ALKPHOS 194* 176*  BILITOT 1.3* 3.4*  PROT 6.9 5.7*  ALBUMIN 3.6 3.1*   Recent Labs  Lab 03/27/22 1945 03/28/22 1129   LIPASE 61* 93*   Recent Labs  Lab 03/28/22 1129  AMMONIA 26    ABG No results found for: "PHART", "PCO2ART", "PO2ART", "HCO3", "TCO2", "ACIDBASEDEF", "O2SAT"   Coagulation Profile: No results for input(s): "INR", "PROTIME" in the last 168 hours.  Cardiac Enzymes: No results for input(s): "CKTOTAL", "CKMB", "CKMBINDEX", "TROPONINI" in the last 168 hours.  HbA1C: Hgb A1c MFr Bld  Date/Time Value Ref Range Status  02/15/2020 08:27 AM 6.0 (H) 4.8 - 5.6 % Final    Comment:    (NOTE) Pre diabetes:          5.7%-6.4%  Diabetes:              >6.4%  Glycemic control for   <7.0% adults with diabetes     CBG: Recent Labs  Lab 03/28/22 1056  GLUCAP 191*    Review of Systems:   No chest pain.  No orthopnea or PND.  Comprehensive review of systems otherwise negative.  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   Surgical History:   Past Surgical History:  Procedure Laterality Date   COLONOSCOPY     PARTIAL KNEE ARTHROPLASTY Left 02/25/2020   Procedure: UNICOMPARTMENTAL KNEE;  Surgeon: Gaynelle Arabian, MD;  Location: [facility];  Service: Orthopedics;  Laterality: Left;  50mn     Social History:   reports that she has never smoked. She has never used smokeless tobacco. She reports that she does not drink alcohol and does not use drugs.   Family History:  Her family history is negative for Colon cancer.   Allergies Allergies  Allergen Reactions   Pyridium [Phenazopyridine Hcl] Other (See  Comments)    Hemolytic anemia   Sulfa Antibiotics     Potential allergy: oxidizing drug: methemoglobinemia/hemolysis on pyridium   Ciprofloxacin Hcl     Sx's almost like a heart attack   Esomeprazole Magnesium Nausea Only    Pain and nausea   Macrodantin [Nitrofurantoin Macrocrystal] Other (See Comments)    GI Upset   Pantoprazole Sodium Nausea Only    abd pain and nausea   Tramadol Nausea Only     Home Medications  Prior to Admission medications   Medication Sig Start Date End Date Taking? Authorizing Provider  ascorbic acid (VITAMIN C) 500 MG tablet Take 500 mg by mouth daily.    [provider]  cefdinir (OMNICEF) 300 MG capsule Take 1 capsule (300 mg total) by mouth 2 (two) times daily. 11/21/20   RQuintella Reichert MD  Cholecalciferol (VITAMIN D3) 2000 UNITS TABS Take 1 tablet by mouth daily.    [provider]  COVID-19 mRNA Vac-TriS, Pfizer, SUSP injection Inject into the muscle. 09/23/20   SBaxter Flattery  Caren Griffins, MD  glucosamine-chondroitin 500-400 MG tablet Take 1 tablet by mouth 3 (three) times daily.    [provider]  glucose blood (ACCU-CHEK AVIVA PLUS) test strip Use to check blood sugar 3 times daily. 01/20/22     levothyroxine (SYNTHROID, LEVOTHROID) 75 MCG tablet Take 75 mcg by mouth daily.    [provider]  losartan (COZAAR) 100 MG tablet Take 100 mg by mouth daily.    [provider]  methocarbamol (ROBAXIN) 500 MG tablet Take 1 tablet (500 mg total) by mouth every 6 (six) hours as needed for muscle spasms. 02/26/20   Edmisten, Ok Anis, PA  Multiple Vitamin (MULTIVITAMIN) tablet Take 1 tablet by mouth daily.    [provider]  Omega-3 Fatty Acids (FISH OIL) 500 MG CAPS Take 500 mg by mouth 2 (two) times daily.    [provider]  omeprazole (PRILOSEC) 40 MG capsule Take 1 capsule (40 mg total) by mouth daily. 03/12/15   Pyrtle, Lajuan Lines, MD  ondansetron (ZOFRAN-ODT) 4 MG disintegrating tablet Take 1 tablet (4 mg  total) by mouth every 8 (eight) hours as needed for nausea or vomiting. 09/15/21   White, Leitha Schuller, NP  oxyCODONE (OXY IR/ROXICODONE) 5 MG immediate release tablet Take 1-2 tablets (5-10 mg total) by mouth every 6 (six) hours as needed for severe pain. 02/26/20   Edmisten, Kristie L, PA  pravastatin (PRAVACHOL) 40 MG tablet Take 40 mg by mouth daily. 01/12/20   [provider]  sitaGLIPtan-metformin (JANUMET) 50-500 MG per tablet Take 1 tablet by mouth 2 (two) times daily with a meal.    [provider]  vitamin B-12 (CYANOCOBALAMIN) 100 MCG tablet Take 100 mcg by mouth daily.    [provider]     Critical care time:     CRITICAL CARE Performed by: Lanier Clam   Total critical care time: 40 minutes  Critical care time was exclusive of separately billable procedures and treating other patients.  Critical care was necessary to treat or prevent imminent or life-threatening deterioration.  Critical care was time spent personally by me on the following activities: development of treatment plan with patient and/or surrogate as well as nursing, discussions with consultants, evaluation of patient's response to treatment, examination of patient, obtaining history from patient or surrogate, ordering and performing treatments and interventions, ordering and review of laboratory studies, ordering and review of radiographic studies, pulse oximetry and re-evaluation of patient's condition.

## 2022-03-28 NOTE — Consult Note (Addendum)
Lauren [name]  [birth year] [id number]  CARE TEAM:  PCP: Jani Gravel, MD  Outpatient Care Team: Patient Care Team: Jani Gravel, MD as PCP - General (Internal Medicine) Pyrtle, Lajuan Lines, MD as Consulting Physician (Gastroenterology)  Inpatient Treatment Team: Treatment Team: Attending Provider: Reubin Milan, MD; Rounding Team: Jackelyn Knife, MD; Consulting Physician: Carol Ada, MD; Consulting Physician: Gatha Mayer, MD; Consulting Physician: Edison Pace, Md, MD   This patient is a 82 y.o.female who presents today for surgical evaluation at the request of Dr Olevia Bowens.   Chief complaint / Reason for evaluation: Abdominal pain with increased liver function test.  Rule out cholecystitis.  Elderly [place] woman.  That is her preferred language.  Usually declined interpreter and son excellent.  Husband in room.  She has been having upper abdominal pain.  Had an episode after having some bacon and rice.  Tried some over-the-counter Pepcid which did not seem to help.  Over-the-counter pain medicines did not help.  Felt more bloated.  Not to the point of emesis.  She came in rather confused and actually required resuscitation.  Feeling rather exhausted.  Elevated liver function tests.  Hepatitis panel and further workup done.  Initial hepatitis panel negative.  No history of heavy acetaminophen use.  Serum acetaminophen undetectable.  CT scan negative when she was having other symptoms a few weeks ago.  Came and felt worse.  Given suspicion ultrasound done.  Some mild thickening but not classic for cholecystitis.    Worsening LFTs.  MRCP ordered.  Gastroenterology and surgical consultations requested.  Patient has been followed by [facility] gastrology.  Has had some anemia presumed to be iron deficient.  Had upper and lower and even capsule endoscopy in 2017 without any obvious source.  No major cardiac or pulmonary issues.  Diabetes with persistent hyperglycemia.  Hypokalemic as well.  No  leukocytosis.  Mildly elevated lipase.   Assessment  Lauren [name]  82 y.o. female       Problem List:  Principal Problem:   Hepatitis Active Problems:   HTN (hypertension)   Hyperlipidemia   Type 2 diabetes mellitus (HCC)   GERD (gastroesophageal reflux disease)   Hypothyroidism   Nausea without vomiting   Chronic kidney disease, stage 2 (mild)   Iron deficiency anemia   Recurrent urinary tract infection   Vertigo   Hypokalemia   LFT elevation   Elevated liver function tests with worsening bilirubin.  Concern of cholangitis and possible cholecystitis.  Plan:  -While is reasonable to wonder if the gallbladder could be could be suspected etiology, the absence of strong evidence of cholecystitis and ultrasound gives me pause.  Being challenging to get history.  In general, I think ultrasound is usually more accurate over an MRCP and diagnosing cholecystitis.  I would switch to piperacillin/tazobactam given how sick she came in with LFTs rather elevated and bilirubin worse.  Agree with gastroenterology evaluation.  MRCP that showed only dilated bile duct with possible sludge within bile duct raising concern for cholangitis but no definite obstructing choledocholithiasis.  They wonder about cholecystitis without although I find that is not usually as definitive as an ultrasound.  Dr. Benson Norway covering for [facility] to evaluate.  Do bowel rest and more aggressive antibiotics for now.  My instinct is if the trend bilirubin is worse, may be reasonable to consider ERCP with evacuation first if there is concern of cholangitis, and then revisiting the idea of considering cholecystectomy if rest of the workup  is negative.  History of iron deficiency anemia with unremarkable workup and persistent anemia.  Will get anemia panel.  Wonder if hematology needs to be involved if no easy or standard explanation.  Surgery will follow to see if cholecystectomy appropriate this hospitalization.  VTE  prophylaxis- SCDs, etc  mobilize as tolerated to help recovery  I reviewed nursing notes, ED provider notes, hospitalist notes, last 24 h vitals and pain scores, last 48 h intake and output, last 24 h labs and trends, and last 24 h imaging results. I have reviewed this patient's available data, including medical history, events of note, test results, etc as part of my evaluation.  A significant portion of that time was spent in counseling.  Care during the described time interval was provided by me.  This care required moderate level of medical decision making.  03/28/2022  Adin Hector, MD, FACS, MASCRS Esophageal, Gastrointestinal & Colorectal Surgery Robotic and Minimally Invasive Surgery  [facility] [address] [place], [place] [address] [phone] Fax [phone] Main  CONTACT INFORMATION:  Weekday (9AM-5PM): Call [facility] main office at [phone]  Weeknight (5PM-9AM) or Weekend/Holiday: Check [email or web address] (password " [email or web address]") for General Surgery [facility] coverage  (Please, do not use SecureChat as it is not reliable communication to reach operating surgeons for immediate patient care given surgeries/outpatient duties/clinic/cross-coverage/off post-call which would lead to a delay in care.  Epic staff messaging available for outptient concerns, but may not be answered for 48 hours or more).     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   Osteoporosis    UTI (lower urinary tract infection)    Vitamin D deficiency     Past Surgical History:  Procedure Laterality Date   COLONOSCOPY     PARTIAL KNEE ARTHROPLASTY Left 02/25/2020   Procedure: UNICOMPARTMENTAL KNEE;  Surgeon: Gaynelle Arabian, MD;  Location: [facility];  Service: Orthopedics;  Laterality: Left;  57mn    Social History   Socioeconomic History   Marital status: Widowed    Spouse name: Not on file   Number of children: 3   Years of education: Not on file   Highest education level: Not on file  Occupational History   Occupation: retired    [employer] farm manager RETIRED  Tobacco Use   Smoking status: Never   Smokeless tobacco: Never  Substance and Sexual Activity   Alcohol use: No   Drug use: No   Sexual activity: Never  Other Topics Concern   Not on file  Social History Narrative   Not on file   Social Determinants of Health   Financial Resource Strain: Not on file  Food Insecurity: Not on file  Transportation Needs: Not on file  Physical Activity: Not on file  Stress: Not on file  Social Connections:  Not on file  Intimate Partner Violence: Not on file    Family History  Problem Relation Age of Onset   Colon cancer Neg Hx     Current Facility-Administered Medications  Medication Dose Route Frequency Provider Last Rate Last Admin   0.9 % NaCl with KCl 20 mEq/ L  infusion   Intravenous Continuous Reubin Milan, MD       alum & mag hydroxide-simeth (MAALOX/MYLANTA) 200-200-20 MG/5ML suspension 30 mL  30 mL Oral Q6H PRN Michael Boston, MD       bisacodyl (DULCOLAX) suppository 10 mg  10 mg Rectal Q12H PRN Michael Boston, MD       famotidine (PEPCID) IVPB 20 mg premix  20 mg Intravenous Q24H Reubin Milan, MD       fentaNYL (SUBLIMAZE) injection 12.5-25 mcg  12.5-25 mcg Intravenous Q2H PRN Michael Boston, MD       iohexol (OMNIPAQUE) 300 MG/ML solution 100 mL  100 mL Intravenous Once PRN Reubin Milan,  MD       lip balm (CARMEX) ointment   Topical BID Michael Boston, MD       magic mouthwash  15 mL Oral QID PRN Michael Boston, MD       magnesium sulfate IVPB 1 g 100 mL  1 g Intravenous Once Reubin Milan, MD       menthol-cetylpyridinium (CEPACOL) lozenge 3 mg  1 lozenge Oral PRN Michael Boston, MD       methocarbamol (ROBAXIN) 1,000 mg in dextrose 5 % 100 mL IVPB  1,000 mg Intravenous Q6H PRN Michael Boston, MD       naloxone [facility]) injection 0.4 mg  0.4 mg Intravenous PRN Reubin Milan, MD   0.4 mg at 03/28/22 1044   ondansetron (ZOFRAN) injection 4 mg  4 mg Intravenous Q6H PRN Michael Boston, MD       Or   ondansetron (ZOFRAN) 8 mg in sodium chloride 0.9 % 50 mL IVPB  8 mg Intravenous Q6H PRN Michael Boston, MD       phenol (CHLORASEPTIC) mouth spray 2 spray  2 spray Mouth/Throat PRN Michael Boston, MD       piperacillin-tazobactam (ZOSYN) IVPB 3.375 g  3.375 g Intravenous Tor Netters, MD       polyethylene glycol (MIRALAX / GLYCOLAX) packet 17 g  17 g Oral Daily Michael Boston, MD       prochlorperazine (COMPAZINE) injection 5-10 mg  5-10 mg Intravenous Q4H PRN Michael Boston, MD         Allergies  Allergen Reactions   Pyridium [Phenazopyridine Hcl] Other (See Comments)    Hemolytic anemia   Sulfa Antibiotics     Potential allergy: oxidizing drug: methemoglobinemia/hemolysis on pyridium   Ciprofloxacin Hcl     Sx's almost like a heart attack   Esomeprazole Magnesium Nausea Only    Pain and nausea   Macrodantin [Nitrofurantoin Macrocrystal] Other (See Comments)    GI Upset   Pantoprazole Sodium Nausea Only    abd pain and nausea   Tramadol Nausea Only    ROS:   All other systems reviewed & are negative except per HPI or as noted below: Constitutional:  No fevers, chills, sweats.  Weight stable Eyes:  No vision changes, No discharge HENT:  No sore throats, nasal drainage Lymph: No neck swelling, No bruising easily Pulmonary:  No cough, productive sputum CV: No  orthopnea, PND  Patient walks 1/4 mile without difficulty.  No exertional chest/neck/shoulder/arm pain.  GI:  No personal nor family history of GI/colon cancer, inflammatory bowel disease, irritable bowel syndrome, allergy such as Celiac Sprue, dietary/dairy problems, colitis, ulcers nor gastritis.  No recent sick contacts/gastroenteritis.  No travel outside the country.  No changes in diet.  Renal: No major urinary incontinence.  Workup suspicious for UTI.  No hematuria Genital:  No drainage, bleeding, masses Musculoskeletal: No severe joint pain.  Good ROM major joints Skin:  No sores or lesions Heme/Lymph:  No easy bleeding.  No swollen lymph nodes   BP (!) 76/47 (BP Location: Left Arm)   Pulse (!) 103   Temp 98.1 F (36.7 C)   Resp 16   SpO2 99%   Physical Exam:  Constitutional: Not cachectic.  Hygeine adequate.  Vitals signs as above.   Eyes: Pupils reactive, normal extraocular movements. Sclera icteric Neuro: CN II-XII intact.  No major focal sensory defects.  No major motor deficits. Lymph: No head/neck/groin lymphadenopathy Psych:  No severe agitation.  No severe anxiety.  Judgment & insight Adequate, Oriented x3, HENT: Normocephalic, Mucus membranes moist.  No thrush.   Neck: Supple, No tracheal deviation.  No obvious thyromegaly Chest: No pain to chest wall compression.  Good respiratory excursion.  No audible wheezing CV:  Pulses intact.  regular rhythm.  No major extremity edema  Abdomen:  Flat Hernia: Not present. Diastasis recti: Not present. Soft.   Nondistended.  Mild upper abdominal pain.  Not classic Murphy sign.  No peritonitis or guarding today..  No hepatomegaly.  No splenomegaly  Gen:  Inguinal hernia: Not present.  Inguinal lymph nodes: without lymphadenopathy.   Rectal: (Deferred) Ext: No obvious deformity or contracture.  Edema:  Bilateral lower extremity mild 1+ .  No cyanosis Skin: No major subcutaneous nodules.  Warm and dry.  Looks  jaundiced Musculoskeletal: Severe joint rigidity not present.  No obvious clubbing.  No digital petechiae.     Results:   Labs: Results for orders placed or performed during the hospital encounter of 03/27/22 (from the past 48 hour(s))  Lipase, blood     Status: Abnormal   Collection Time: 03/27/22  7:45 PM  Result Value Ref Range   Lipase 61 (H) 11 - 51 U/L    Comment: Performed at [facility], [address] [place], [place] [address]  Comprehensive metabolic panel     Status: Abnormal   Collection Time: 03/27/22  7:45 PM  Result Value Ref Range   Sodium 131 (L) 135 - 145 mmol/L   Potassium 3.4 (L) 3.5 - 5.1 mmol/L   Chloride 97 (L) 98 - 111 mmol/L   CO2 22 22 - 32 mmol/L   Glucose, Bld 156 (H) 70 - 99 mg/dL    Comment: Glucose reference range applies only to samples taken after fasting for at least 8 hours.   BUN 17 8 - 23 mg/dL   Creatinine, Ser 0.52 0.44 - 1.00 mg/dL   Calcium 9.1 8.9 - 10.3 mg/dL   Total Protein 6.9 6.5 - 8.1 g/dL   Albumin 3.6 3.5 - 5.0 g/dL   AST 1,803 (H) 15 - 41 U/L   ALT 546 (H) 0 - 44 U/L   Alkaline Phosphatase 194 (H) 38 - 126 U/L   Total Bilirubin 1.3 (H) 0.3 - 1.2 mg/dL   GFR, Estimated >60 >60 mL/min    Comment: (NOTE) Calculated using the CKD-EPI Creatinine Equation (2021)    Anion gap 12 5 - 15    Comment: Performed at [facility], [address] [place], [place] [address]  CBC     Status: Abnormal   Collection Time: 03/27/22  7:45 PM  Result Value Ref Range   WBC 5.2 4.0 - 10.5 K/uL   RBC 3.53 (L) 3.87 - 5.11 MIL/uL   Hemoglobin 10.7 (L) 12.0 - 15.0 g/dL   HCT 31.6 (L) 36.0 - 46.0 %   MCV 89.5 80.0 - 100.0 fL   MCH 30.3 26.0 - 34.0 pg   MCHC 33.9 30.0 - 36.0 g/dL   RDW 14.6 11.5 - 15.5 %   Platelets 218 150 - 400 K/uL   nRBC 0.0 0.0 - 0.2 %    Comment: Performed at [facility], [address] [place], [place] [address]  Urinalysis, Routine w reflex microscopic Urine, Catheterized     Status:  Abnormal   Collection Time: 03/27/22  7:45 PM  Result Value Ref Range   Color, Urine YELLOW YELLOW   APPearance HAZY (A) CLEAR   Specific Gravity, Urine 1.015 1.005 - 1.030   pH 8.5 (H) 5.0 - 8.0   Glucose, UA NEGATIVE NEGATIVE mg/dL   Hgb urine dipstick NEGATIVE NEGATIVE   Bilirubin Urine NEGATIVE NEGATIVE   Ketones, ur 15 (A) NEGATIVE mg/dL   Protein, ur 30 (A) NEGATIVE mg/dL   Nitrite POSITIVE (A) NEGATIVE   Leukocytes,Ua NEGATIVE NEGATIVE    Comment: Performed at [facility], [address] [place], [place] [address]  Troponin I (High Sensitivity)     Status: None   Collection Time: 03/27/22  7:45 PM  Result Value Ref Range   Troponin I (High Sensitivity) 7 <18 ng/L    Comment: (NOTE) Elevated high sensitivity troponin I (hsTnI) values and significant  changes across serial measurements may suggest ACS but many other  chronic and acute conditions are known to elevate hsTnI results.  Refer to the "Links" section for chest pain algorithms and additional  guidance. Performed at [facility], [address] [place], [place] [address]   Lactic acid, plasma     Status: Abnormal   Collection Time: 03/27/22  7:45 PM  Result Value Ref Range   Lactic Acid, Venous 2.1 (HH) 0.5 - 1.9 mmol/L    Comment: CRITICAL RESULT CALLED TO, READ BACK BY AND VERIFIED WITH POWELL,V RN '@2030'$  1.13.24 EDENSCA Performed at [facility], [address] [place], [place] [address]   Urinalysis, Microscopic (reflex)     Status: Abnormal   Collection Time: 03/27/22  7:45 PM  Result Value Ref Range   RBC / HPF 0-5 0 - 5 RBC/hpf   WBC, UA 0-5 0 - 5 WBC/hpf   Bacteria, UA MANY (A) NONE SEEN   Squamous Epithelial / HPF 0-5 0 - 5 /HPF   WBC Clumps PRESENT     Comment: Performed at [facility], [address] [place], [place] [address]  Troponin I (High Sensitivity)     Status: None   Collection Time: 03/27/22  9:40 PM  Result Value Ref Range   Troponin I  (High Sensitivity) 7 <18 ng/L    Comment: (NOTE) Elevated high sensitivity troponin I (hsTnI) values and significant  changes across serial measurements may suggest ACS but many other  chronic and acute conditions are known to elevate hsTnI results.  Refer to the "Links" section for chest pain algorithms and additional  guidance. Performed at [facility], [address],  [place], [place] [address]   Lactic acid, plasma     Status: None   Collection Time: 03/27/22  9:40 PM  Result Value Ref Range   Lactic Acid, Venous 1.4 0.5 - 1.9 mmol/L    Comment: Performed at [facility], [address] [place], [place] [address]  Hepatitis panel, acute     Status: None   Collection Time: 03/27/22  9:50 PM  Result Value Ref Range   Hepatitis B Surface Ag NON REACTIVE NON REACTIVE   HCV Ab NON REACTIVE NON REACTIVE    Comment: (NOTE) Nonreactive HCV antibody screen is consistent with no HCV infections,  unless recent infection is suspected or other evidence exists to indicate HCV infection.     Hep A IgM NON REACTIVE NON REACTIVE   Hep B C IgM NON REACTIVE NON REACTIVE    Comment: Performed at [facility], [place] [address] [place], [place] [address]  Acetaminophen level     Status: Abnormal   Collection Time: 03/27/22  9:50 PM  Result Value Ref Range   Acetaminophen (Tylenol), Serum <10 (L) 10 - 30 ug/mL    Comment: (NOTE) Therapeutic concentrations vary significantly. A range of 10-30 ug/mL  may be an effective concentration for many patients. However, some  are best treated at concentrations outside of this range. Acetaminophen concentrations >150 ug/mL at 4 hours after ingestion  and >50 ug/mL at 12 hours after ingestion are often associated with  toxic reactions.  Performed at [facility], [address] [place], [place] [address]   Gamma GT     Status: Abnormal   Collection Time: 03/27/22  9:50 PM  Result Value Ref Range   GGT 415 (H) 7  - 50 U/L    Comment: Performed at [facility], [place] [address], [place], [place] [address]  Lactate dehydrogenase     Status: Abnormal   Collection Time: 03/27/22  9:50 PM  Result Value Ref Range   LDH 1,637 (H) 98 - 192 U/L    Comment: Performed at [facility], [place] [address] [place], [place] [address]  Glucose, capillary     Status: Abnormal   Collection Time: 03/28/22 10:56 AM  Result Value Ref Range   Glucose-Capillary 191 (H) 70 - 99 mg/dL    Comment: Glucose reference range applies only to samples taken after fasting for at least 8 hours.  Comprehensive metabolic panel     Status: Abnormal   Collection Time: 03/28/22 11:29 AM  Result Value Ref Range   Sodium 133 (L) 135 - 145 mmol/L   Potassium 2.9 (L) 3.5 - 5.1 mmol/L   Chloride 98 98 - 111 mmol/L   CO2 20 (L) 22 - 32 mmol/L   Glucose, Bld 197 (H) 70 - 99 mg/dL    Comment: Glucose reference range applies only to samples taken after fasting for at least 8 hours.   BUN 25 (H) 8 - 23 mg/dL   Creatinine, Ser 1.00 0.44 - 1.00 mg/dL   Calcium 8.3 (L) 8.9 - 10.3 mg/dL   Total Protein 5.7 (L) 6.5 - 8.1 g/dL   Albumin 3.1 (L) 3.5 - 5.0 g/dL   AST 1,332 (H) 15 - 41 U/L   ALT 752 (H) 0 - 44 U/L   Alkaline Phosphatase 176 (H) 38 - 126 U/L   Total Bilirubin 3.4 (H) 0.3 - 1.2 mg/dL   GFR, Estimated 56 (L) >60 mL/min  Comment: (NOTE) Calculated using the CKD-EPI Creatinine Equation (2021)    Anion gap 15 5 - 15    Comment: Performed at [facility], [place] [address] [place], [place] [address]  CBC     Status: Abnormal   Collection Time: 03/28/22 11:29 AM  Result Value Ref Range   WBC 9.7 4.0 - 10.5 K/uL   RBC 3.50 (L) 3.87 - 5.11 MIL/uL   Hemoglobin 10.9 (L) 12.0 - 15.0 g/dL   HCT 32.7 (L) 36.0 - 46.0 %   MCV 93.4 80.0 - 100.0 fL   MCH 31.1 26.0 - 34.0 pg   MCHC 33.3 30.0 - 36.0 g/dL   RDW 15.0 11.5 - 15.5 %   Platelets 118 (L) 150 - 400 K/uL   nRBC 0.0 0.0 - 0.2 %    Comment: Performed at  [facility], [place] [address], [place], [place] [address]  Lipase, blood     Status: Abnormal   Collection Time: 03/28/22 11:29 AM  Result Value Ref Range   Lipase 93 (H) 11 - 51 U/L    Comment: Performed at [facility], [place] [address], [place], [place] [address]  Ammonia     Status: None   Collection Time: 03/28/22 11:29 AM  Result Value Ref Range   Ammonia 26 9 - 35 umol/L    Comment: HEMOLYSIS AT THIS LEVEL MAY AFFECT RESULT Performed at [place] [address] [place], [place] [address]   Magnesium     Status: None   Collection Time: 03/28/22 11:29 AM  Result Value Ref Range   Magnesium 1.9 1.7 - 2.4 mg/dL    Comment: Performed at [facility], [place] [address] [place], [place] [address]  Phosphorus     Status: None   Collection Time: 03/28/22 11:29 AM  Result Value Ref Range   Phosphorus 3.8 2.5 - 4.6 mg/dL    Comment: HEMOLYSIS AT THIS LEVEL MAY AFFECT RESULT Performed at [place] [address], [place], [place] [address]     Imaging / Studies: MR ABDOMEN MRCP W WO CONTAST  Result Date: 03/28/2022 CLINICAL DATA:  Severe epigastric pain. Elevated liver function tests. Biliary ductal dilatation on recent ultrasound. EXAM: MRI ABDOMEN WITHOUT AND WITH CONTRAST (INCLUDING MRCP) TECHNIQUE: Multiplanar multisequence MR imaging of the abdomen was performed both before and after the administration of intravenous contrast. Heavily T2-weighted images of the biliary and pancreatic ducts were obtained, and three-dimensional MRCP images were rendered by post processing. CONTRAST:  21m GADAVIST GADOBUTROL 1 MMOL/ML IV SOLN COMPARISON:  Ultrasound on 03/28/2022 and CT on 03/27/2022 FINDINGS: Lower chest: Mild dependent bibasilar atelectasis. Hepatobiliary: Several small subcapsular wedge-shaped signal abnormalities are seen in the anterior segment of the right hepatic lobe and medial segment  of the left hepatic lobe, which show moderate T2 hyperintensity and lack of contrast enhancement. These are nonspecific and could represent small hepatic infarcts or abscesses. Mild diffuse hepatic steatosis is noted. Mild perihepatic ascites is seen. Gallbladder is mildly distended, and shows mild wall thickening and pericholecystic edema. These findings are suspicious for acute cholecystitis. Mild dilatation of the central intrahepatic bile ducts and common bile duct is seen the level of the ampulla, with common bile duct measuring up to 10 mm. Diffuse T2 hypointensity is noted within the common duct suspicious for sludge, however no discrete common duct calculi are identified. Pancreas: No mass or inflammatory changes. No evidence of pancreatic ductal dilatation or pancreas  divisum. Spleen:  Within normal limits in size and appearance. Adrenals/Urinary Tract: No suspicious masses identified. No evidence of hydronephrosis. Stomach/Bowel: Unremarkable. Vascular/Lymphatic: No pathologically enlarged lymph nodes identified. No acute vascular findings. Other:  None. Musculoskeletal:  No suspicious bone lesions identified. IMPRESSION: Mild gallbladder wall thickening and pericholecystic edema, highly suspicious for acute cholecystitis. Mild dilatation of the central intrahepatic bile ducts and common bile duct to the level of the ampulla. T2 hypointense sludge seen throughout the common bile duct, without definite calculi. Cholangitis cannot be excluded. Several small subcapsular wedge-shaped signal abnormalities in the peripheral right and left hepatic lobes. These are nonspecific, and differential diagnosis includes small hepatic infarcts or abscesses. Mild hepatic steatosis.  Mild perihepatic ascites. Electronically Signed   By: Marlaine Hind M.D.   On: 03/28/2022 15:17   MR 3D Recon At Scanner  Result Date: 03/28/2022 CLINICAL DATA:  Severe epigastric pain. Elevated liver function tests. Biliary ductal  dilatation on recent ultrasound. EXAM: MRI ABDOMEN WITHOUT AND WITH CONTRAST (INCLUDING MRCP) TECHNIQUE: Multiplanar multisequence MR imaging of the abdomen was performed both before and after the administration of intravenous contrast. Heavily T2-weighted images of the biliary and pancreatic ducts were obtained, and three-dimensional MRCP images were rendered by post processing. CONTRAST:  19m GADAVIST GADOBUTROL 1 MMOL/ML IV SOLN COMPARISON:  Ultrasound on 03/28/2022 and CT on 03/27/2022 FINDINGS: Lower chest: Mild dependent bibasilar atelectasis. Hepatobiliary: Several small subcapsular wedge-shaped signal abnormalities are seen in the anterior segment of the right hepatic lobe and medial segment of the left hepatic lobe, which show moderate T2 hyperintensity and lack of contrast enhancement. These are nonspecific and could represent small hepatic infarcts or abscesses. Mild diffuse hepatic steatosis is noted. Mild perihepatic ascites is seen. Gallbladder is mildly distended, and shows mild wall thickening and pericholecystic edema. These findings are suspicious for acute cholecystitis. Mild dilatation of the central intrahepatic bile ducts and common bile duct is seen the level of the ampulla, with common bile duct measuring up to 10 mm. Diffuse T2 hypointensity is noted within the common duct suspicious for sludge, however no discrete common duct calculi are identified. Pancreas: No mass or inflammatory changes. No evidence of pancreatic ductal dilatation or pancreas divisum. Spleen:  Within normal limits in size and appearance. Adrenals/Urinary Tract: No suspicious masses identified. No evidence of hydronephrosis. Stomach/Bowel: Unremarkable. Vascular/Lymphatic: No pathologically enlarged lymph nodes identified. No acute vascular findings. Other:  None. Musculoskeletal:  No suspicious bone lesions identified. IMPRESSION: Mild gallbladder wall thickening and pericholecystic edema, highly suspicious for acute  cholecystitis. Mild dilatation of the central intrahepatic bile ducts and common bile duct to the level of the ampulla. T2 hypointense sludge seen throughout the common bile duct, without definite calculi. Cholangitis cannot be excluded. Several small subcapsular wedge-shaped signal abnormalities in the peripheral right and left hepatic lobes. These are nonspecific, and differential diagnosis includes small hepatic infarcts or abscesses. Mild hepatic steatosis.  Mild perihepatic ascites. Electronically Signed   By: JMarlaine HindM.D.   On: 03/28/2022 15:17   UKoreaAbdomen Limited RUQ (LIVER/GB)  Result Date: 03/28/2022 CLINICAL DATA:  Acute severe epigastric pain. Elevated liver function tests. EXAM: ULTRASOUND ABDOMEN LIMITED RIGHT UPPER QUADRANT COMPARISON:  CTA on 03/27/2022 FINDINGS: Gallbladder: Small amount of echogenic sludge seen within the gallbladder, however no definite gallstones identified. Mild diffuse gallbladder wall thickening is seen measuring to 5 mm. No sonographic Murphy sign noted by sonographer. Common bile duct: Diameter: 10 mm, which is mildly dilated. Echogenic sludge noted within the common bile  duct, however no definite intraductal calculi are seen by ultrasound. No evidence of intrahepatic biliary ductal dilatation. Liver: Mildly increased echogenicity of the hepatic parenchyma, consistent with hepatic steatosis. No hepatic mass identified. Portal vein is patent on color Doppler imaging with normal direction of blood flow towards the liver. Other: None. IMPRESSION: Small amount of gallbladder sludge, however no definite gallstones identified. Mild diffuse gallbladder wall thickening, which is nonspecific. This could be secondary to hepatocellular disease, although cholecystitis cannot be excluded. Recommend clinical correlation and consider nuclear medicine hepatobiliary scan if clinically warranted. Mild dilatation of common bile duct measuring 10 mm, which contains sludge. Consider  further evaluation with abdomen MRI and MRCP without and with contrast to evaluate for choledocholithiasis if clinically warranted. Mild hepatic steatosis. Electronically Signed   By: Marlaine Hind M.D.   On: 03/28/2022 10:04   CT Angio Chest/Abd/Pel for Dissection W and/or Wo Contrast  Result Date: 03/27/2022 CLINICAL DATA:  Acute aortic syndrome (AAS) suspected severe epigastric pain EXAM: CT ANGIOGRAPHY CHEST, ABDOMEN AND PELVIS TECHNIQUE: Non-contrast CT of the chest was initially obtained. Multidetector CT imaging through the chest, abdomen and pelvis was performed using the standard protocol during bolus administration of intravenous contrast. Multiplanar reconstructed images and MIPs were obtained and reviewed to evaluate the vascular anatomy. RADIATION DOSE REDUCTION: This exam was performed according to the departmental dose-optimization program which includes automated exposure control, adjustment of the mA and/or kV according to patient size and/or use of iterative reconstruction technique. CONTRAST:  157m OMNIPAQUE IOHEXOL 350 MG/ML SOLN COMPARISON:  11/21/2020 FINDINGS: CTA CHEST FINDINGS Cardiovascular: Heart is normal size. Aorta is normal caliber. No evidence of aortic dissection. No pulmonary embolus. Scattered coronary artery and aortic calcifications. Mediastinum/Nodes: Calcified mediastinal lymph nodes. No mediastinal, hilar, or axillary adenopathy. Trachea and esophagus are unremarkable. Thyroid unremarkable. Lungs/Pleura: Lungs are clear. No focal airspace opacities or suspicious nodules. No effusions. Musculoskeletal: Chest wall soft tissues are unremarkable. No acute bony abnormality. Review of the MIP images confirms the above findings. CTA ABDOMEN AND PELVIS FINDINGS VASCULAR Aorta: Aortic calcifications.  No aneurysm or dissection. Celiac: Mild narrowing at the origin.  No aneurysm or dissection. SMA: Widely patent Renals: Widely patent IMA: Widely patent Inflow: Atherosclerotic  calcifications.  No aneurysm or dissection. Veins: No obvious venous abnormality within the limitations of this arterial phase study. Review of the MIP images confirms the above findings. NON-VASCULAR Hepatobiliary: Low-density throughout the liver compatible with fatty infiltration. Gallbladder unremarkable. No biliary ductal dilatation. Pancreas: No focal abnormality or ductal dilatation. Spleen: Calcifications throughout the spleen compatible with old granulomatous disease. Normal size. Adrenals/Urinary Tract: No adrenal abnormality. No focal renal abnormality. No stones or hydronephrosis. Urinary bladder is unremarkable. Stomach/Bowel: Stomach, large and small bowel grossly unremarkable. Lymphatic: No adenopathy Reproductive: Uterus and adnexa unremarkable.  No mass. Other: No free fluid or free air. Musculoskeletal: No acute bony abnormality. Review of the MIP images confirms the above findings. IMPRESSION: No evidence of aortic aneurysm or dissection. Aortic atherosclerosis. Old granulomatous disease. No acute findings in the chest, abdomen or pelvis. Hepatic steatosis. Electronically Signed   By: KRolm BaptiseM.D.   On: 03/27/2022 21:14   CT Head Wo Contrast  Result Date: 03/12/2022 CLINICAL DATA:  New onset headache EXAM: CT HEAD WITHOUT CONTRAST TECHNIQUE: Contiguous axial images were obtained from the base of the skull through the vertex without intravenous contrast. RADIATION DOSE REDUCTION: This exam was performed according to the departmental dose-optimization program which includes automated exposure control, adjustment of the mA  and/or kV according to patient size and/or use of iterative reconstruction technique. COMPARISON:  12/13/2014 FINDINGS: Brain: No mass, hemorrhage or extra-axial collection. Mild volume loss. There is periventricular hypoattenuation compatible with chronic microvascular disease. Vascular: Calcific atherosclerosis at the skull base. Skull: Normal. Negative for fracture or  focal lesion. Sinuses/Orbits: No acute finding. Other: None IMPRESSION: 1. No acute intracranial abnormality. 2. Mild volume loss and chronic microvascular disease. Electronically Signed   By: Ulyses Jarred M.D.   On: 03/12/2022 02:34    Medications / Allergies: per chart  Antibiotics: Anti-infectives (From admission, onward)    Start     Dose/Rate Route Frequency Ordered Stop   03/29/22 0800  cefTRIAXone (ROCEPHIN) 2 g in sodium chloride 0.9 % 100 mL IVPB  Status:  Discontinued        2 g 200 mL/hr over 30 Minutes Intravenous Every 24 hours 03/28/22 1058 03/28/22 1212   03/29/22 0800  cefTRIAXone (ROCEPHIN) 1 g in sodium chloride 0.9 % 100 mL IVPB  Status:  Discontinued        1 g 200 mL/hr over 30 Minutes Intravenous Every 24 hours 03/28/22 1212 03/28/22 1603   03/28/22 1700  piperacillin-tazobactam (ZOSYN) IVPB 3.375 g        3.375 g 12.5 mL/hr over 240 Minutes Intravenous Every 8 hours 03/28/22 1603 04/02/22 1659   03/28/22 1115  metroNIDAZOLE (FLAGYL) IVPB 500 mg  Status:  Discontinued        500 mg 100 mL/hr over 60 Minutes Intravenous 2 times daily 03/28/22 1058 03/28/22 1609   03/28/22 0645  cefTRIAXone (ROCEPHIN) 1 g in sodium chloride 0.9 % 100 mL IVPB        1 g 200 mL/hr over 30 Minutes Intravenous  Once 03/28/22 7654 03/28/22 0801         Note: Portions of this report may have been transcribed using voice recognition software. Every effort was made to ensure accuracy; however, inadvertent computerized transcription errors may be present.   Any transcriptional errors that result from this process are unintentional.    Adin Hector, MD, FACS, MASCRS Esophageal, Gastrointestinal & Colorectal Surgery Robotic and Minimally Invasive Surgery  [facility]. [address] [place], [place] [address] [phone] Fax [phone] Main  CONTACT INFORMATION:  Weekday (9AM-5PM): Call [facility] main office at  [phone]  Weeknight (5PM-9AM) or Weekend/Holiday: Check [email or web address] (password " [email or web address]") for General Surgery [facility] coverage  (Please, do not use SecureChat as it is not reliable communication to reach operating surgeons for immediate patient care given surgeries/outpatient duties/clinic/cross-coverage/off post-call which would lead to a delay in care.  Epic staff messaging available for outptient concerns, but may not be answered for 48 hours or more).      03/28/2022  4:19 PM

## 2022-03-28 NOTE — ED Notes (Signed)
[facility] at bedside 

## 2022-03-28 NOTE — H&P (View-Only) (Signed)
Reason for Consult: Elevated liver enzymes and dilated CBD at 10 mm Referring Physician: [facility]  Lauren [name] HPI: This is an 82 year old female with multiple medical problems admitted for complaints of severe epigastric pain.  Her pain started acutely after eating some rice and bacon.  Her symptoms were not relieved with over the counter medications and she was then brought to the hospital.  Evaluation in the ER showed that she had a marked elevation in her liver enzymes:  AST 1803, ALT 546, AP 194, and TB 1.3.  Today her liver enzymes show an increase in her ALT to 752, but a declin of her AST and AP to 1332 and 176, respectively.  The TB did increase up to 3.4.  Imaging with a CT angio was negative for any evidence of vascular source of her pain, but she was noted to have hepatic steatosis.  There was no report of any biliary ductal dilation.  An ultrasound was performed and it reported sludge and a dilated CBD at 10 mm with sludge.  The current MRCP is pending at this time.  Her acetaminophen level was <10 and acute viral hepatidities were negative for HAV, HBV, and HCV.  With the CTA no vascular issues were noted in the liver.  The patient's son reports that on the same day she was taking some type of supplement from [place] her pain was very severe, but now her pain has improved greatly.  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 She has never used smokeless tobacco. She reports that she does not drink alcohol and does not use drugs.  Allergies:  Allergies  Allergen Reactions   Pyridium [Phenazopyridine Hcl] Other (See Comments)    Hemolytic anemia   Sulfa Antibiotics     Potential allergy: oxidizing drug: methemoglobinemia/hemolysis on pyridium   Ciprofloxacin Hcl     Sx's almost like a heart attack   Esomeprazole Magnesium Nausea Only    Pain and nausea   Macrodantin [Nitrofurantoin Macrocrystal] Other (See Comments)    GI Upset   Pantoprazole Sodium Nausea Only    abd pain and nausea   Tramadol Nausea Only    Medications: Scheduled: Continuous:  0.9 % NaCl with KCl 20 mEq / L     [START ON 03/29/2022] cefTRIAXone (ROCEPHIN)  IV  famotidine (PEPCID) IV     magnesium sulfate bolus IVPB     metronidazole     potassium chloride      Results for orders placed or performed during the hospital encounter of 03/27/22 (from the past 24 hour(s))  Lipase, blood     Status: Abnormal   Collection Time: 03/27/22  7:45 PM  Result Value Ref Range   Lipase 61 (H) 11 - 51 U/L  Comprehensive metabolic panel     Status: Abnormal   Collection Time: 03/27/22  7:45 PM  Result Value Ref Range   Sodium 131 (L) 135 - 145 mmol/L   Potassium 3.4 (L) 3.5 - 5.1 mmol/L   Chloride 97 (L) 98 - 111 mmol/L   CO2 22 22 - 32 mmol/L   Glucose, Bld 156 (H) 70 - 99 mg/dL   BUN 17 8 - 23 mg/dL   Creatinine, Ser 0.52 0.44 - 1.00 mg/dL   Calcium 9.1 8.9 - 10.3 mg/dL   Total Protein 6.9 6.5 - 8.1 g/dL   Albumin 3.6 3.5 - 5.0 g/dL    AST 1,803 (H) 15 - 41 U/L   ALT 546 (H) 0 - 44 U/L   Alkaline Phosphatase 194 (H) 38 - 126 U/L   Total Bilirubin 1.3 (H) 0.3 - 1.2 mg/dL   GFR, Estimated >60 >60 mL/min   Anion gap 12 5 - 15  CBC     Status: Abnormal   Collection Time: 03/27/22  7:45 PM  Result Value Ref Range   WBC 5.2 4.0 - 10.5 K/uL   RBC 3.53 (L) 3.87 - 5.11 MIL/uL   Hemoglobin 10.7 (L) 12.0 - 15.0 g/dL   HCT 31.6 (L) 36.0 - 46.0 %   MCV 89.5 80.0 - 100.0 fL   MCH 30.3 26.0 - 34.0 pg   MCHC 33.9 30.0 - 36.0 g/dL   RDW 14.6 11.5 - 15.5 %   Platelets 218 150 - 400 K/uL   nRBC 0.0 0.0 - 0.2 %  Urinalysis, Routine w reflex microscopic Urine, Catheterized     Status: Abnormal   Collection Time: 03/27/22  7:45 PM  Result Value Ref Range   Color, Urine YELLOW YELLOW   APPearance HAZY (A) CLEAR   Specific Gravity, Urine 1.015 1.005 - 1.030   pH 8.5 (H) 5.0 - 8.0   Glucose, UA NEGATIVE NEGATIVE mg/dL   Hgb urine dipstick NEGATIVE NEGATIVE   Bilirubin Urine NEGATIVE NEGATIVE   Ketones, ur 15 (A) NEGATIVE mg/dL   Protein, ur 30 (A) NEGATIVE mg/dL   Nitrite POSITIVE (A) NEGATIVE   Leukocytes,Ua NEGATIVE NEGATIVE  Troponin I (High Sensitivity)     Status: None   Collection Time: 03/27/22  7:45 PM  Result Value Ref Range   Troponin I (High Sensitivity) 7 <18 ng/L  Lactic acid, plasma     Status: Abnormal   Collection Time: 03/27/22  7:45 PM  Result Value Ref Range   Lactic Acid, Venous 2.1 (HH) 0.5 - 1.9 mmol/L  Urinalysis, Microscopic (reflex)     Status: Abnormal   Collection Time: 03/27/22  7:45 PM  Result Value Ref Range   RBC / HPF 0-5 0 - 5 RBC/hpf   WBC, UA 0-5 0 - 5 WBC/hpf   Bacteria, UA MANY (A) NONE SEEN   Squamous Epithelial / HPF 0-5 0 - 5 /HPF   WBC Clumps PRESENT   Troponin I (High Sensitivity)     Status: None   Collection Time: 03/27/22  9:40 PM  Result Value Ref Range   Troponin I (High Sensitivity) 7 <18 ng/L  Lactic acid, plasma     Status: None   Collection Time: 03/27/22  9:40 PM   Result Value Ref Range   Lactic Acid, Venous 1.4 0.5 - 1.9 mmol/L  Hepatitis panel, acute     Status: None   Collection Time: 03/27/22  9:50 PM  Result Value Ref Range   Hepatitis B Surface Ag NON REACTIVE NON REACTIVE   HCV Ab NON REACTIVE NON REACTIVE   Hep A IgM NON REACTIVE NON REACTIVE   Hep B C IgM NON REACTIVE NON REACTIVE  Acetaminophen level     Status: Abnormal   Collection Time: 03/27/22  9:50 PM  Result Value Ref Range   Acetaminophen (Tylenol), Serum <10 (L) 10 - 30 ug/mL  Gamma GT     Status: Abnormal   Collection Time: 03/27/22  9:50 PM  Result Value Ref Range   GGT 415 (H) 7 - 50 U/L  Lactate dehydrogenase     Status: Abnormal   Collection Time: 03/27/22  9:50 PM  Result Value Ref Range   LDH 1,637 (H) 98 - 192 U/L  Glucose, capillary     Status: Abnormal   Collection Time: 03/28/22 10:56 AM  Result Value Ref Range   Glucose-Capillary 191 (H) 70 - 99 mg/dL  Comprehensive metabolic panel     Status: Abnormal   Collection Time: 03/28/22 11:29 AM  Result Value Ref Range   Sodium 133 (L) 135 - 145 mmol/L   Potassium 2.9 (L) 3.5 - 5.1 mmol/L   Chloride 98 98 - 111 mmol/L   CO2 20 (L) 22 - 32 mmol/L   Glucose, Bld 197 (H) 70 - 99 mg/dL   BUN 25 (H) 8 - 23 mg/dL   Creatinine, Ser 1.00 0.44 - 1.00 mg/dL   Calcium 8.3 (L) 8.9 - 10.3 mg/dL   Total Protein 5.7 (L) 6.5 - 8.1 g/dL   Albumin 3.1 (L) 3.5 - 5.0 g/dL   AST 1,332 (H) 15 - 41 U/L   ALT 752 (H) 0 - 44 U/L   Alkaline Phosphatase 176 (H) 38 - 126 U/L   Total Bilirubin 3.4 (H) 0.3 - 1.2 mg/dL   GFR, Estimated 56 (L) >60 mL/min   Anion gap 15 5 - 15  CBC     Status: Abnormal   Collection Time: 03/28/22 11:29 AM  Result Value Ref Range   WBC 9.7 4.0 - 10.5 K/uL   RBC 3.50 (L) 3.87 - 5.11 MIL/uL   Hemoglobin 10.9 (L) 12.0 - 15.0 g/dL   HCT 32.7 (L) 36.0 - 46.0 %   MCV 93.4 80.0 - 100.0 fL   MCH 31.1 26.0 - 34.0 pg   MCHC 33.3 30.0 - 36.0 g/dL   RDW 15.0 11.5 - 15.5 %   Platelets 118 (L) 150 - 400 K/uL    nRBC 0.0 0.0 - 0.2 %  Lipase, blood     Status: Abnormal   Collection Time: 03/28/22 11:29 AM  Result Value Ref Range   Lipase 93 (H) 11 - 51 U/L  Ammonia     Status: None   Collection Time: 03/28/22 11:29 AM  Result Value Ref Range   Ammonia 26 9 - 35 umol/L  Magnesium     Status: None   Collection Time: 03/28/22 11:29 AM  Result Value Ref Range   Magnesium 1.9 1.7 - 2.4 mg/dL  Phosphorus     Status: None  Collection Time: 03/28/22 11:29 AM  Result Value Ref Range   Phosphorus 3.8 2.5 - 4.6 mg/dL     US Abdomen Limited RUQ (LIVER/GB)  Result Date: 03/28/2022 CLINICAL DATA:  Acute severe epigastric pain. Elevated liver function tests. EXAM: ULTRASOUND ABDOMEN LIMITED RIGHT UPPER QUADRANT COMPARISON:  CTA on 03/27/2022 FINDINGS: Gallbladder: Small amount of echogenic sludge seen within the gallbladder, however no definite gallstones identified. Mild diffuse gallbladder wall thickening is seen measuring to 5 mm. No sonographic Murphy sign noted by sonographer. Common bile duct: Diameter: 10 mm, which is mildly dilated. Echogenic sludge noted within the common bile duct, however no definite intraductal calculi are seen by ultrasound. No evidence of intrahepatic biliary ductal dilatation. Liver: Mildly increased echogenicity of the hepatic parenchyma, consistent with hepatic steatosis. No hepatic mass identified. Portal vein is patent on color Doppler imaging with normal direction of blood flow towards the liver. Other: None. IMPRESSION: Small amount of gallbladder sludge, however no definite gallstones identified. Mild diffuse gallbladder wall thickening, which is nonspecific. This could be secondary to hepatocellular disease, although cholecystitis cannot be excluded. Recommend clinical correlation and consider nuclear medicine hepatobiliary scan if clinically warranted. Mild dilatation of common bile duct measuring 10 mm, which contains sludge. Consider further evaluation with abdomen MRI and  MRCP without and with contrast to evaluate for choledocholithiasis if clinically warranted. Mild hepatic steatosis. Electronically Signed   By: Marlaine Hind M.D.   On: 03/28/2022 10:04   CT Angio Chest/Abd/Pel for Dissection W and/or Wo Contrast  Result Date: 03/27/2022 CLINICAL DATA:  Acute aortic syndrome (AAS) suspected severe epigastric pain EXAM: CT ANGIOGRAPHY CHEST, ABDOMEN AND PELVIS TECHNIQUE: Non-contrast CT of the chest was initially obtained. Multidetector CT imaging through the chest, abdomen and pelvis was performed using the standard protocol during bolus administration of intravenous contrast. Multiplanar reconstructed images and MIPs were obtained and reviewed to evaluate the vascular anatomy. RADIATION DOSE REDUCTION: This exam was performed according to the departmental dose-optimization program which includes automated exposure control, adjustment of the mA and/or kV according to patient size and/or use of iterative reconstruction technique. CONTRAST:  138m OMNIPAQUE IOHEXOL 350 MG/ML SOLN COMPARISON:  11/21/2020 FINDINGS: CTA CHEST FINDINGS Cardiovascular: Heart is normal size. Aorta is normal caliber. No evidence of aortic dissection. No pulmonary embolus. Scattered coronary artery and aortic calcifications. Mediastinum/Nodes: Calcified mediastinal lymph nodes. No mediastinal, hilar, or axillary adenopathy. Trachea and esophagus are unremarkable. Thyroid unremarkable. Lungs/Pleura: Lungs are clear. No focal airspace opacities or suspicious nodules. No effusions. Musculoskeletal: Chest wall soft tissues are unremarkable. No acute bony abnormality. Review of the MIP images confirms the above findings. CTA ABDOMEN AND PELVIS FINDINGS VASCULAR Aorta: Aortic calcifications.  No aneurysm or dissection. Celiac: Mild narrowing at the origin.  No aneurysm or dissection. SMA: Widely patent Renals: Widely patent IMA: Widely patent Inflow: Atherosclerotic calcifications.  No aneurysm or dissection.  Veins: No obvious venous abnormality within the limitations of this arterial phase study. Review of the MIP images confirms the above findings. NON-VASCULAR Hepatobiliary: Low-density throughout the liver compatible with fatty infiltration. Gallbladder unremarkable. No biliary ductal dilatation. Pancreas: No focal abnormality or ductal dilatation. Spleen: Calcifications throughout the spleen compatible with old granulomatous disease. Normal size. Adrenals/Urinary Tract: No adrenal abnormality. No focal renal abnormality. No stones or hydronephrosis. Urinary bladder is unremarkable. Stomach/Bowel: Stomach, large and small bowel grossly unremarkable. Lymphatic: No adenopathy Reproductive: Uterus and adnexa unremarkable.  No mass. Other: No free fluid or free air. Musculoskeletal: No acute bony abnormality. Review of the  MIP images confirms the above findings. IMPRESSION: No evidence of aortic aneurysm or dissection. Aortic atherosclerosis. Old granulomatous disease. No acute findings in the chest, abdomen or pelvis. Hepatic steatosis. Electronically Signed   By: Rolm Baptise M.D.   On: 03/27/2022 21:14    ROS:  As stated above in the HPI otherwise negative.  Blood pressure (!) 102/53, pulse 100, temperature 98 F (36.7 C), temperature source Oral, resp. rate 17, SpO2 97 %.    PE: Gen: NAD, Alert and Oriented HEENT:  [place]/AT, EOMI Neck: Supple, no LAD Lungs: CTA Bilaterally CV: RRR without M/G/R ABD: Soft, mild epigastric tenderness, +BS Ext: No C/C/E  Assessment/Plan: 1) Abnormal liver enzymes. 2) Cholelithiasis. 3) Choledocholithiasis.   The liver enzymes are markedly elevated and the MRCP does show sludge in the CBD.  It is not typical for the liver enzymes to be this high, but the imaging and clinical presentation are consistent with a biliary issue.  Plan: 1) ERCP tomorrow with Dr. Carlean Purl. 2) Continue with Zosyn.  Volanda Mangine D 03/28/2022, 1:35 PM

## 2022-03-29 ENCOUNTER — Inpatient Hospital Stay (HOSPITAL_COMMUNITY): Payer: Medicare Other

## 2022-03-29 ENCOUNTER — Encounter (HOSPITAL_COMMUNITY): Payer: Self-pay | Admitting: Family Medicine

## 2022-03-29 ENCOUNTER — Inpatient Hospital Stay (HOSPITAL_COMMUNITY): Payer: Medicare Other | Admitting: Anesthesiology

## 2022-03-29 ENCOUNTER — Encounter (HOSPITAL_COMMUNITY)
Admission: EM | Disposition: A | Discharge status: Home Health | Payer: Self-pay | Source: Home / Self Care | Attending: Internal Medicine

## 2022-03-29 DIAGNOSIS — E039 Hypothyroidism, unspecified: Secondary | ICD-10-CM

## 2022-03-29 DIAGNOSIS — K8309 Other cholangitis: Secondary | ICD-10-CM | POA: Diagnosis not present

## 2022-03-29 DIAGNOSIS — I10 Essential (primary) hypertension: Secondary | ICD-10-CM

## 2022-03-29 DIAGNOSIS — D638 Anemia in other chronic diseases classified elsewhere: Secondary | ICD-10-CM

## 2022-03-29 DIAGNOSIS — K8050 Calculus of bile duct without cholangitis or cholecystitis without obstruction: Secondary | ICD-10-CM

## 2022-03-29 DIAGNOSIS — R579 Shock, unspecified: Secondary | ICD-10-CM | POA: Diagnosis not present

## 2022-03-29 DIAGNOSIS — K759 Inflammatory liver disease, unspecified: Secondary | ICD-10-CM | POA: Diagnosis not present

## 2022-03-29 HISTORY — PX: REMOVAL OF STONES: SHX5545

## 2022-03-29 HISTORY — PX: SPHINCTEROTOMY: SHX5544

## 2022-03-29 HISTORY — PX: ERCP: SHX5425

## 2022-03-29 LAB — CBC
HCT: 27.7 % — ABNORMAL LOW (ref 36.0–46.0)
Hemoglobin: 9.3 g/dL — ABNORMAL LOW (ref 12.0–15.0)
MCH: 30.8 pg (ref 26.0–34.0)
MCHC: 33.6 g/dL (ref 30.0–36.0)
MCV: 91.7 fL (ref 80.0–100.0)
Platelets: 54 10*3/uL — ABNORMAL LOW (ref 150–400)
RBC: 3.02 MIL/uL — ABNORMAL LOW (ref 3.87–5.11)
RDW: 15.4 % (ref 11.5–15.5)
WBC: 12.8 10*3/uL — ABNORMAL HIGH (ref 4.0–10.5)
nRBC: 0 % (ref 0.0–0.2)

## 2022-03-29 LAB — COMPREHENSIVE METABOLIC PANEL
ALT: 582 U/L — ABNORMAL HIGH (ref 0–44)
AST: 649 U/L — ABNORMAL HIGH (ref 15–41)
Albumin: 3.7 g/dL (ref 3.5–5.0)
Alkaline Phosphatase: 114 U/L (ref 38–126)
Anion gap: 12 (ref 5–15)
BUN: 33 mg/dL — ABNORMAL HIGH (ref 8–23)
CO2: 18 mmol/L — ABNORMAL LOW (ref 22–32)
Calcium: 7.9 mg/dL — ABNORMAL LOW (ref 8.9–10.3)
Chloride: 104 mmol/L (ref 98–111)
Creatinine, Ser: 1.15 mg/dL — ABNORMAL HIGH (ref 0.44–1.00)
GFR, Estimated: 47 mL/min — ABNORMAL LOW (ref >60)
Glucose, Bld: 140 mg/dL — ABNORMAL HIGH (ref 70–99)
Potassium: 4 mmol/L (ref 3.5–5.1)
Sodium: 134 mmol/L — ABNORMAL LOW (ref 135–145)
Total Bilirubin: 4.4 mg/dL — ABNORMAL HIGH (ref 0.3–1.2)
Total Protein: 6 g/dL — ABNORMAL LOW (ref 6.5–8.1)

## 2022-03-29 LAB — IRON AND TIBC
Iron: 11 ug/dL — ABNORMAL LOW (ref 28–170)
Saturation Ratios: 6 % — ABNORMAL LOW (ref 10.4–31.8)
TIBC: 182 ug/dL — ABNORMAL LOW (ref 250–450)
UIBC: 171 ug/dL

## 2022-03-29 LAB — RETICULOCYTES
Immature Retic Fract: 11.2 % (ref 2.3–15.9)
RBC.: 3.03 MIL/uL — ABNORMAL LOW (ref 3.87–5.11)
Retic Count, Absolute: 60.3 10*3/uL (ref 19.0–186.0)
Retic Ct Pct: 2 % (ref 0.4–3.1)

## 2022-03-29 LAB — MRSA NEXT GEN BY PCR, NASAL: MRSA by PCR Next Gen: NOT DETECTED

## 2022-03-29 LAB — VITAMIN B12: Vitamin B-12: 3840 pg/mL — ABNORMAL HIGH (ref 180–914)

## 2022-03-29 LAB — GLUCOSE, CAPILLARY
Glucose-Capillary: 130 mg/dL — ABNORMAL HIGH (ref 70–99)
Glucose-Capillary: 101 mg/dL — ABNORMAL HIGH (ref 70–99)

## 2022-03-29 LAB — PREALBUMIN: Prealbumin: 14 mg/dL — ABNORMAL LOW (ref 18–38)

## 2022-03-29 LAB — FERRITIN: Ferritin: 466 ng/mL — ABNORMAL HIGH (ref 11–307)

## 2022-03-29 LAB — FOLATE: Folate: 10 ng/mL (ref >5.9)

## 2022-03-29 SURGERY — ERCP, WITH INTERVENTION IF INDICATED
Anesthesia: General

## 2022-03-29 MED ORDER — ONDANSETRON HCL 4 MG/2ML IJ SOLN
INTRAMUSCULAR | Status: DC | PRN
  Administered 2022-03-29: 4 mg via INTRAVENOUS

## 2022-03-29 MED ORDER — LIDOCAINE 2% (20 MG/ML) 5 ML SYRINGE
INTRAMUSCULAR | Status: DC | PRN
  Administered 2022-03-29: 60 mg via INTRAVENOUS

## 2022-03-29 MED ORDER — FENTANYL CITRATE (PF) 100 MCG/2ML IJ SOLN
INTRAMUSCULAR | Status: AC
  Filled 2022-03-29: qty 2

## 2022-03-29 MED ORDER — SODIUM CHLORIDE 0.9 % IV SOLN: INTRAVENOUS | Status: DC | PRN

## 2022-03-29 MED ORDER — GLUCAGON HCL RDNA (DIAGNOSTIC) 1 MG IJ SOLR
INTRAMUSCULAR | Status: AC
  Filled 2022-03-29: qty 2

## 2022-03-29 MED ORDER — PHENYLEPHRINE HCL (PRESSORS) 10 MG/ML IV SOLN
INTRAVENOUS | Status: AC
  Filled 2022-03-29: qty 1

## 2022-03-29 MED ORDER — PROPOFOL 10 MG/ML IV BOLUS
INTRAVENOUS | Status: AC
  Filled 2022-03-29: qty 20

## 2022-03-29 MED ORDER — PHENYLEPHRINE 80 MCG/ML (10ML) SYRINGE FOR IV PUSH (FOR BLOOD PRESSURE SUPPORT)
PREFILLED_SYRINGE | INTRAVENOUS | Status: DC | PRN
  Administered 2022-03-29: 80 ug via INTRAVENOUS
  Administered 2022-03-29 (×2): 160 ug via INTRAVENOUS
  Administered 2022-03-29: 80 ug via INTRAVENOUS

## 2022-03-29 MED ORDER — LACTATED RINGERS IV SOLN: INTRAVENOUS | Status: DC | PRN

## 2022-03-29 MED ORDER — LIDOCAINE HCL (PF) 2 % IJ SOLN
INTRAMUSCULAR | Status: AC
  Filled 2022-03-29: qty 5

## 2022-03-29 MED ORDER — POTASSIUM CHLORIDE 10 MEQ/100ML IV SOLN
10.0000 meq | INTRAVENOUS | Status: AC
  Administered 2022-03-29 (×2): 10 meq via INTRAVENOUS
  Filled 2022-03-29: qty 100

## 2022-03-29 MED ORDER — ROCURONIUM BROMIDE 10 MG/ML (PF) SYRINGE
PREFILLED_SYRINGE | INTRAVENOUS | Status: DC | PRN
  Administered 2022-03-29: 40 mg via INTRAVENOUS

## 2022-03-29 MED ORDER — PIPERACILLIN-TAZOBACTAM 3.375 G IVPB
3.3750 g | Freq: Three times a day (TID) | INTRAVENOUS | Status: DC
  Administered 2022-03-29 – 2022-04-02 (×13): 3.375 g via INTRAVENOUS
  Filled 2022-03-29 (×13): qty 50

## 2022-03-29 MED ORDER — LACTATED RINGERS IV BOLUS: 1000.0000 mL | Freq: Three times a day (TID) | INTRAVENOUS | Status: AC | PRN

## 2022-03-29 MED ORDER — SODIUM CHLORIDE 0.9 % IV SOLN
INTRAVENOUS | Status: DC | PRN
  Administered 2022-03-29: 32 mL

## 2022-03-29 MED ORDER — CHLORHEXIDINE GLUCONATE CLOTH 2 % EX PADS
6.0000 | MEDICATED_PAD | Freq: Every day | CUTANEOUS | Status: DC
  Administered 2022-03-30 – 2022-04-08 (×10): 6 via TOPICAL

## 2022-03-29 MED ORDER — SUGAMMADEX SODIUM 200 MG/2ML IV SOLN
INTRAVENOUS | Status: DC | PRN
  Administered 2022-03-29: 200 mg via INTRAVENOUS

## 2022-03-29 MED ORDER — SUCCINYLCHOLINE CHLORIDE 200 MG/10ML IV SOSY
PREFILLED_SYRINGE | INTRAVENOUS | Status: DC | PRN
  Administered 2022-03-29: 100 mg via INTRAVENOUS

## 2022-03-29 MED ORDER — DICLOFENAC SUPPOSITORY 100 MG
RECTAL | Status: AC
  Filled 2022-03-29: qty 1

## 2022-03-29 MED ORDER — LACTATED RINGERS IV BOLUS
1000.0000 mL | Freq: Once | INTRAVENOUS | Status: AC
  Administered 2022-03-29: 1000 mL via INTRAVENOUS

## 2022-03-29 MED ORDER — FENTANYL CITRATE (PF) 100 MCG/2ML IJ SOLN
INTRAMUSCULAR | Status: DC | PRN
  Administered 2022-03-29 (×2): 25 ug via INTRAVENOUS

## 2022-03-29 MED ORDER — PROPOFOL 10 MG/ML IV BOLUS
INTRAVENOUS | Status: DC | PRN
  Administered 2022-03-29: 50 mg via INTRAVENOUS

## 2022-03-29 MED ORDER — VASOPRESSIN 20 UNIT/ML IV SOLN
INTRAVENOUS | Status: AC
  Filled 2022-03-29: qty 1

## 2022-03-29 MED ORDER — CIPROFLOXACIN IN D5W 400 MG/200ML IV SOLN
INTRAVENOUS | Status: AC
  Filled 2022-03-29: qty 200

## 2022-03-29 MED ORDER — SODIUM CHLORIDE 0.9 % IV SOLN: INTRAVENOUS | Status: DC

## 2022-03-29 MED ORDER — DICLOFENAC SUPPOSITORY 100 MG
RECTAL | Status: DC | PRN
  Administered 2022-03-29: 100 mg via RECTAL

## 2022-03-29 NOTE — Anesthesia Procedure Notes (Signed)
Procedure Name: Intubation Date/Time: 03/29/2022 1:11 PM  Performed by: Niel Hummer, CRNAPre-anesthesia Checklist: Patient identified, Emergency Drugs available, Suction available and Patient being monitored Patient Re-evaluated:Patient Re-evaluated prior to induction Oxygen Delivery Method: Circle system utilized Preoxygenation: Pre-oxygenation with 100% oxygen Induction Type: IV induction and Rapid sequence Laryngoscope Size: Mac and 3 Grade View: Grade I Tube type: Oral Tube size: 7.0 mm Number of attempts: 1 Airway Equipment and Method: Stylet Placement Confirmation: ETT inserted through vocal cords under direct vision, positive ETCO2 and breath sounds checked- equal and bilateral Secured at: 22 cm Tube secured with: Tape Dental Injury: Teeth and Oropharynx as per pre-operative assessment

## 2022-03-29 NOTE — Transfer of Care (Signed)
Immediate Anesthesia Transfer of Care Note  Patient: Lauren [name]  Procedure(s) Performed: ENDOSCOPIC RETROGRADE CHOLANGIOPANCREATOGRAPHY (ERCP) SPHINCTEROTOMY REMOVAL OF STONES  Patient Location: PACU  Anesthesia Type:General  Level of Consciousness: drowsy  Airway & Oxygen Therapy: Patient Spontanous Breathing  Post-op Assessment: Report given to RN, Post -op Vital signs reviewed and stable, and Patient moving all extremities X 4  Post vital signs: Reviewed and stable  Last Vitals:  Vitals Value Taken Time  BP 120/44   Temp    Pulse 102 03/29/22 1409  Resp 17 03/29/22 1409  SpO2 97 % 03/29/22 1409  Vitals shown include unvalidated device data.  Last Pain:  Vitals:   03/29/22 1228  TempSrc: Temporal  PainSc:          Complications: No notable events documented.

## 2022-03-29 NOTE — Progress Notes (Signed)
Day of Surgery   Subjective/Chief Complaint: Pt with con't abd pain Con't to req Levo   Objective: Vital signs in last 24 hours: Temp:  [97.5 F (36.4 C)-98.8 F (37.1 C)] 98 F (36.7 C) (01/15 0300) Pulse Rate:  [91-106] 98 (01/15 0700) Resp:  [12-27] 25 (01/15 0700) BP: (70-138)/(33-70) 119/67 (01/15 0700) SpO2:  [95 %-100 %] 99 % (01/15 0700) Last BM Date : 03/27/22  Intake/Output from previous day: 01/14 0701 - 01/15 0700 In: 3742.6 [I.V.:748.3; IV Piggyback:2994.4] Out: 1180 [Urine:1180] Intake/Output this shift: No intake/output data recorded.  PE:  Constitutional: No acute distress, conversant, appears states age. Eyes: Anicteric sclerae, moist conjunctiva, no lid lag Lungs: Clear to auscultation bilaterally, normal respiratory effort CV: regular rate and rhythm, no murmurs, no peripheral edema, pedal pulses 2+ GI: Soft, no masses or hepatosplenomegaly, ttp Skin: No rashes, palpation reveals normal turgor Psychiatric: appropriate judgment and insight, oriented to person, place, and time   Lab Results:  Recent Labs    03/28/22 1922 03/29/22 0325  WBC 14.1* 12.8*  HGB 11.4* 9.3*  HCT 32.9* 27.7*  PLT 61* 54*   BMET Recent Labs    03/28/22 1129 03/29/22 0325  NA 133* 134*  K 2.9* 4.0  CL 98 104  CO2 20* 18*  GLUCOSE 197* 140*  BUN 25* 33*  CREATININE 1.00 1.15*  CALCIUM 8.3* 7.9*   PT/INR No results for input(s): "LABPROT", "INR" in the last 72 hours. ABG No results for input(s): "PHART", "HCO3" in the last 72 hours.  Invalid input(s): "PCO2", "PO2"  Studies/Results: MR ABDOMEN MRCP W WO CONTAST  Result Date: 03/28/2022 CLINICAL DATA:  Severe epigastric pain. Elevated liver function tests. Biliary ductal dilatation on recent ultrasound. EXAM: MRI ABDOMEN WITHOUT AND WITH CONTRAST (INCLUDING MRCP) TECHNIQUE: Multiplanar multisequence MR imaging of the abdomen was performed both before and after the administration of intravenous contrast.  Heavily T2-weighted images of the biliary and pancreatic ducts were obtained, and three-dimensional MRCP images were rendered by post processing. CONTRAST:  71m GADAVIST GADOBUTROL 1 MMOL/ML IV SOLN COMPARISON:  Ultrasound on 03/28/2022 and CT on 03/27/2022 FINDINGS: Lower chest: Mild dependent bibasilar atelectasis. Hepatobiliary: Several small subcapsular wedge-shaped signal abnormalities are seen in the anterior segment of the right hepatic lobe and medial segment of the left hepatic lobe, which show moderate T2 hyperintensity and lack of contrast enhancement. These are nonspecific and could represent small hepatic infarcts or abscesses. Mild diffuse hepatic steatosis is noted. Mild perihepatic ascites is seen. Gallbladder is mildly distended, and shows mild wall thickening and pericholecystic edema. These findings are suspicious for acute cholecystitis. Mild dilatation of the central intrahepatic bile ducts and common bile duct is seen the level of the ampulla, with common bile duct measuring up to 10 mm. Diffuse T2 hypointensity is noted within the common duct suspicious for sludge, however no discrete common duct calculi are identified. Pancreas: No mass or inflammatory changes. No evidence of pancreatic ductal dilatation or pancreas divisum. Spleen:  Within normal limits in size and appearance. Adrenals/Urinary Tract: No suspicious masses identified. No evidence of hydronephrosis. Stomach/Bowel: Unremarkable. Vascular/Lymphatic: No pathologically enlarged lymph nodes identified. No acute vascular findings. Other:  None. Musculoskeletal:  No suspicious bone lesions identified. IMPRESSION: Mild gallbladder wall thickening and pericholecystic edema, highly suspicious for acute cholecystitis. Mild dilatation of the central intrahepatic bile ducts and common bile duct to the level of the ampulla. T2 hypointense sludge seen throughout the common bile duct, without definite calculi. Cholangitis cannot be excluded.  Several  small subcapsular wedge-shaped signal abnormalities in the peripheral right and left hepatic lobes. These are nonspecific, and differential diagnosis includes small hepatic infarcts or abscesses. Mild hepatic steatosis.  Mild perihepatic ascites. Electronically Signed   By: Marlaine Hind M.D.   On: 03/28/2022 15:17   MR 3D Recon At Scanner  Result Date: 03/28/2022 CLINICAL DATA:  Severe epigastric pain. Elevated liver function tests. Biliary ductal dilatation on recent ultrasound. EXAM: MRI ABDOMEN WITHOUT AND WITH CONTRAST (INCLUDING MRCP) TECHNIQUE: Multiplanar multisequence MR imaging of the abdomen was performed both before and after the administration of intravenous contrast. Heavily T2-weighted images of the biliary and pancreatic ducts were obtained, and three-dimensional MRCP images were rendered by post processing. CONTRAST:  86m GADAVIST GADOBUTROL 1 MMOL/ML IV SOLN COMPARISON:  Ultrasound on 03/28/2022 and CT on 03/27/2022 FINDINGS: Lower chest: Mild dependent bibasilar atelectasis. Hepatobiliary: Several small subcapsular wedge-shaped signal abnormalities are seen in the anterior segment of the right hepatic lobe and medial segment of the left hepatic lobe, which show moderate T2 hyperintensity and lack of contrast enhancement. These are nonspecific and could represent small hepatic infarcts or abscesses. Mild diffuse hepatic steatosis is noted. Mild perihepatic ascites is seen. Gallbladder is mildly distended, and shows mild wall thickening and pericholecystic edema. These findings are suspicious for acute cholecystitis. Mild dilatation of the central intrahepatic bile ducts and common bile duct is seen the level of the ampulla, with common bile duct measuring up to 10 mm. Diffuse T2 hypointensity is noted within the common duct suspicious for sludge, however no discrete common duct calculi are identified. Pancreas: No mass or inflammatory changes. No evidence of pancreatic ductal dilatation  or pancreas divisum. Spleen:  Within normal limits in size and appearance. Adrenals/Urinary Tract: No suspicious masses identified. No evidence of hydronephrosis. Stomach/Bowel: Unremarkable. Vascular/Lymphatic: No pathologically enlarged lymph nodes identified. No acute vascular findings. Other:  None. Musculoskeletal:  No suspicious bone lesions identified. IMPRESSION: Mild gallbladder wall thickening and pericholecystic edema, highly suspicious for acute cholecystitis. Mild dilatation of the central intrahepatic bile ducts and common bile duct to the level of the ampulla. T2 hypointense sludge seen throughout the common bile duct, without definite calculi. Cholangitis cannot be excluded. Several small subcapsular wedge-shaped signal abnormalities in the peripheral right and left hepatic lobes. These are nonspecific, and differential diagnosis includes small hepatic infarcts or abscesses. Mild hepatic steatosis.  Mild perihepatic ascites. Electronically Signed   By: JMarlaine HindM.D.   On: 03/28/2022 15:17   UKoreaAbdomen Limited RUQ (LIVER/GB)  Result Date: 03/28/2022 CLINICAL DATA:  Acute severe epigastric pain. Elevated liver function tests. EXAM: ULTRASOUND ABDOMEN LIMITED RIGHT UPPER QUADRANT COMPARISON:  CTA on 03/27/2022 FINDINGS: Gallbladder: Small amount of echogenic sludge seen within the gallbladder, however no definite gallstones identified. Mild diffuse gallbladder wall thickening is seen measuring to 5 mm. No sonographic Murphy sign noted by sonographer. Common bile duct: Diameter: 10 mm, which is mildly dilated. Echogenic sludge noted within the common bile duct, however no definite intraductal calculi are seen by ultrasound. No evidence of intrahepatic biliary ductal dilatation. Liver: Mildly increased echogenicity of the hepatic parenchyma, consistent with hepatic steatosis. No hepatic mass identified. Portal vein is patent on color Doppler imaging with normal direction of blood flow towards the  liver. Other: None. IMPRESSION: Small amount of gallbladder sludge, however no definite gallstones identified. Mild diffuse gallbladder wall thickening, which is nonspecific. This could be secondary to hepatocellular disease, although cholecystitis cannot be excluded. Recommend clinical correlation and consider nuclear medicine hepatobiliary  scan if clinically warranted. Mild dilatation of common bile duct measuring 10 mm, which contains sludge. Consider further evaluation with abdomen MRI and MRCP without and with contrast to evaluate for choledocholithiasis if clinically warranted. Mild hepatic steatosis. Electronically Signed   By: Marlaine Hind M.D.   On: 03/28/2022 10:04   CT Angio Chest/Abd/Pel for Dissection W and/or Wo Contrast  Result Date: 03/27/2022 CLINICAL DATA:  Acute aortic syndrome (AAS) suspected severe epigastric pain EXAM: CT ANGIOGRAPHY CHEST, ABDOMEN AND PELVIS TECHNIQUE: Non-contrast CT of the chest was initially obtained. Multidetector CT imaging through the chest, abdomen and pelvis was performed using the standard protocol during bolus administration of intravenous contrast. Multiplanar reconstructed images and MIPs were obtained and reviewed to evaluate the vascular anatomy. RADIATION DOSE REDUCTION: This exam was performed according to the departmental dose-optimization program which includes automated exposure control, adjustment of the mA and/or kV according to patient size and/or use of iterative reconstruction technique. CONTRAST:  18m OMNIPAQUE IOHEXOL 350 MG/ML SOLN COMPARISON:  11/21/2020 FINDINGS: CTA CHEST FINDINGS Cardiovascular: Heart is normal size. Aorta is normal caliber. No evidence of aortic dissection. No pulmonary embolus. Scattered coronary artery and aortic calcifications. Mediastinum/Nodes: Calcified mediastinal lymph nodes. No mediastinal, hilar, or axillary adenopathy. Trachea and esophagus are unremarkable. Thyroid unremarkable. Lungs/Pleura: Lungs are clear.  No focal airspace opacities or suspicious nodules. No effusions. Musculoskeletal: Chest wall soft tissues are unremarkable. No acute bony abnormality. Review of the MIP images confirms the above findings. CTA ABDOMEN AND PELVIS FINDINGS VASCULAR Aorta: Aortic calcifications.  No aneurysm or dissection. Celiac: Mild narrowing at the origin.  No aneurysm or dissection. SMA: Widely patent Renals: Widely patent IMA: Widely patent Inflow: Atherosclerotic calcifications.  No aneurysm or dissection. Veins: No obvious venous abnormality within the limitations of this arterial phase study. Review of the MIP images confirms the above findings. NON-VASCULAR Hepatobiliary: Low-density throughout the liver compatible with fatty infiltration. Gallbladder unremarkable. No biliary ductal dilatation. Pancreas: No focal abnormality or ductal dilatation. Spleen: Calcifications throughout the spleen compatible with old granulomatous disease. Normal size. Adrenals/Urinary Tract: No adrenal abnormality. No focal renal abnormality. No stones or hydronephrosis. Urinary bladder is unremarkable. Stomach/Bowel: Stomach, large and small bowel grossly unremarkable. Lymphatic: No adenopathy Reproductive: Uterus and adnexa unremarkable.  No mass. Other: No free fluid or free air. Musculoskeletal: No acute bony abnormality. Review of the MIP images confirms the above findings. IMPRESSION: No evidence of aortic aneurysm or dissection. Aortic atherosclerosis. Old granulomatous disease. No acute findings in the chest, abdomen or pelvis. Hepatic steatosis. Electronically Signed   By: KRolm BaptiseM.D.   On: 03/27/2022 21:14    Anti-infectives: Anti-infectives (From admission, onward)    Start     Dose/Rate Route Frequency Ordered Stop   03/29/22 2100  piperacillin-tazobactam (ZOSYN) IVPB 3.375 g        3.375 g 12.5 mL/hr over 240 Minutes Intravenous Every 8 hours 03/28/22 2051 04/03/22 1259   03/29/22 0800  cefTRIAXone (ROCEPHIN) 2 g in  sodium chloride 0.9 % 100 mL IVPB  Status:  Discontinued        2 g 200 mL/hr over 30 Minutes Intravenous Every 24 hours 03/28/22 1058 03/28/22 1212   03/29/22 0800  cefTRIAXone (ROCEPHIN) 1 g in sodium chloride 0.9 % 100 mL IVPB  Status:  Discontinued        1 g 200 mL/hr over 30 Minutes Intravenous Every 24 hours 03/28/22 1212 03/28/22 1603   03/28/22 1700  piperacillin-tazobactam (ZOSYN) IVPB 3.375 g  Status:  Discontinued        3.375 g 12.5 mL/hr over 240 Minutes Intravenous Every 8 hours 03/28/22 1603 03/28/22 2051   03/28/22 1115  metroNIDAZOLE (FLAGYL) IVPB 500 mg  Status:  Discontinued        500 mg 100 mL/hr over 60 Minutes Intravenous 2 times daily 03/28/22 1058 03/28/22 1609   03/28/22 0645  cefTRIAXone (ROCEPHIN) 1 g in sodium chloride 0.9 % 100 mL IVPB        1 g 200 mL/hr over 30 Minutes Intravenous  Once 03/28/22 0644 03/28/22 0801       Assessment/Plan: 10F with conern for cholangitis and cholecystitis -for ERCP today -con't medical support -will reeval lap chole after ERCP.  May need a few days to stabilize hemodynamically prior to OR  LOS: 1 day    Lauren [name] 03/29/2022

## 2022-03-29 NOTE — Interval H&P Note (Signed)
History and Physical Interval Note:  03/29/2022 12:58 PM  Lauren [name]  has presented today for surgery, with the diagnosis of Choledocholithiasis.  The various methods of treatment have been discussed with the patient and family. After consideration of risks, benefits and other options for treatment, the patient has consented to  Procedure(s): ENDOSCOPIC RETROGRADE CHOLANGIOPANCREATOGRAPHY (ERCP) (N/A) as a surgical intervention.  The patient's history has been reviewed, patient examined, no change in status, stable for surgery.  I have reviewed the patient's chart and labs.  Questions were answered to the patient's satisfaction.     Silvano Rusk

## 2022-03-29 NOTE — Progress Notes (Signed)
PT Cancellation Note  Patient Details Name: Lauren [name] MRN: [id number] DOB: December 13, 1940   Cancelled Treatment:    Reason Eval/Treat Not Completed: Patient not medically ready Scheduled for ERCP today and  on pressors. Continue to follow.  [facility] Office [phone] Weekend pager-[phone]   Claretha Cooper 03/29/2022, 7:49 AM

## 2022-03-29 NOTE — Anesthesia Preprocedure Evaluation (Signed)
Anesthesia Evaluation  Patient identified by MRN, date of birth, ID band Patient confused    Reviewed: Allergy & Precautions, NPO status , Patient's Chart, lab work & pertinent test results  Airway Mallampati: II  TM Distance: >3 FB Neck ROM: Full    Dental  (+) Teeth Intact, Dental Advisory Given   Pulmonary    Pulmonary exam normal breath sounds clear to auscultation       Cardiovascular hypertension, Pt. on medications Normal cardiovascular exam Rhythm:Regular Rate:Normal     Neuro/Psych    GI/Hepatic ,GERD  Medicated,,Choledocholithiasis   Endo/Other  diabetes, Type 2, Oral Hypoglycemic AgentsHypothyroidism    Renal/GU Renal InsufficiencyRenal disease     Musculoskeletal  (+) Arthritis ,    Abdominal   Peds  Hematology  (+) Blood dyscrasia (Thrombocytopenia), anemia   Anesthesia Other Findings   Reproductive/Obstetrics                              Anesthesia Physical Anesthesia Plan  ASA: 3  Anesthesia Plan: General   Post-op Pain Management: Minimal or no pain anticipated   Induction: Intravenous  PONV Risk Score and Plan: 3 and Dexamethasone and Ondansetron  Airway Management Planned: Oral ETT  Additional Equipment: Arterial line, CVP and Ultrasound Guidance Line Placement  Intra-op Plan:   Post-operative Plan: Possible Post-op intubation/ventilation  Informed Consent: I have reviewed the patients History and Physical, chart, labs and discussed the procedure including the risks, benefits and alternatives for the proposed anesthesia with the patient or authorized representative who has indicated his/her understanding and acceptance.     Dental advisory given and Interpreter used for interveiw  Plan Discussed with: CRNA  Anesthesia Plan Comments: (Consent with patient's son. All questions answered. On levophed, will place arterial line, CVL prior to procedure.)          Anesthesia Quick Evaluation

## 2022-03-29 NOTE — Op Note (Signed)
[facility] Patient Name: Lauren [name] Procedure Date: 03/29/2022 MRN: [id number] Attending MD: Gatha Mayer , MD, [id number] Date of Birth: [birth year] CSN: [id number] Age: 82 Admit Type: Inpatient Procedure:                ERCP Indications:              Suspected ascending cholangitis Providers:                Gatha Mayer, MD, Fanny Skates RN, RN, Dulcy Fanny, Darliss Cheney, Technician, Maudry Diego, CRNA Referring MD:             CCS /TRH/PCCM Medicines:                General Anesthesia, Zosyn intermittemt/continuous                            and diclofenac 100 mg per rectum Complications:            No immediate complications. Estimated Blood Loss:     Estimated blood loss: none. Procedure:                Pre-Anesthesia Assessment:                           - Prior to the procedure, a History and Physical                            was performed, and patient medications and                            allergies were reviewed. The patient's tolerance of                            previous anesthesia was also reviewed. The risks                            and benefits of the procedure and the sedation                            options and risks were discussed with the patient.                            All questions were answered, and informed consent                            was obtained. Prior Anticoagulants: The patient has                            taken no anticoagulant or antiplatelet agents. ASA  Grade Assessment: III - A patient with severe                            systemic disease. After reviewing the risks and                            benefits, the patient was deemed in satisfactory                            condition to undergo the procedure.                           After obtaining informed consent, the scope was                            passed under  direct vision. Throughout the                            procedure, the patient's blood pressure, pulse, and                            oxygen saturations were monitored continuously. The                            [employer] single use                            duodenoscope was introduced through the mouth, and                            used to inject contrast into and used to inject                            contrast into the bile duct. The ERCP was                            accomplished without difficulty. The patient                            tolerated the procedure well. Scope In: Scope Out: Findings:      The scout film was normal. Esophagus not seen well. Stomach and duodenum       grossly normal with dark bile drainming from a slightly edematous       papilla. Wire-guided deep cannulation of bile duct performed, contrast       injected. CBD/CHD maximum 10 mm, no discrete filling defeccts though did       suspect sludge as on MRI. very mild intrahepatic dilation. Gallbladder       not filled. Modreate biliary sphincterotomy performed and copiuous dark       bile and some sludge drained + some mucopurulent debris - with balloon       sweeps. Occlusion cholangiogram negative at end. Pancreas not entered by       intent. Impression:               -  Ascending cholangitis was found/suspected based                            upon dark bile and purulent debris Moderate Sedation:      Not Applicable - Patient had care per Anesthesia. Recommendation:           - return to ICU                           [facility] to decide timing of lap chole                           We will f/u tomorrow - clears now NPO after MN                           Continue Abx as we also suspect cholecystitis and                            that may be main issue, iIthink Procedure Code(s):        --- Professional ---                           [phone], Endoscopic retrograde                             cholangiopancreatography (ERCP); with                            sphincterotomy/papillotomy Diagnosis Code(s):        --- Professional ---                           K83.09, Other cholangitis CPT copyright 2022 [employer]. All rights reserved. The codes documented in this report are preliminary and upon coder review may  be revised to meet current compliance requirements. Gatha Mayer, MD 03/29/2022 2:03:59 PM This report has been signed electronically. Number of Addenda: 0

## 2022-03-29 NOTE — Progress Notes (Signed)
82 yo [place] female with abdominal pain / elevated LFT / concern for cholangitis and / or cholecystitis.. Sludge in CBD   MRI / MRCP Mild gallbladder wall thickening and pericholecystic edema, highly suspicious for acute cholecystitis. Mild dilatation of the central intrahepatic bile ducts and common bile duct to the level of the ampulla. T2 hypointense sludge seen throughout the common bile duct, without definite calculi. Cholangitis cannot be excluded.  On Zosyn  Surgery is following.  Plan is for ERCP today but there is concern about hypotension overnight. She became hypotensive during the night, got IVF and albumin. Currently on 6 mcg of Levophed and pressures better and stable 90s-120 / 40s-50s.  Requiring frequent pain meds but looks okay. Son in room, he has no questions remaining about ERCP. I will talk with Endo. She seems stable for ERCP

## 2022-03-29 NOTE — Anesthesia Procedure Notes (Signed)
Arterial Line Insertion Start/End1/15/2024 12:50 AM Performed by: Regan Mcbryar D, CRNA, CRNA  Patient location: Pre-op. Preanesthetic checklist: patient identified, IV checked, site marked, risks and benefits discussed, surgical consent, monitors and equipment checked, pre-op evaluation, timeout performed and anesthesia consent Lidocaine 1% used for infiltration radial was placed Catheter size: 20 G Hand hygiene performed  and maximum sterile barriers used   Attempts: 1 Procedure performed without using ultrasound guided technique. Following insertion, dressing applied and Biopatch. Post procedure assessment: normal and unchanged  Patient tolerated the procedure well with no immediate complications.

## 2022-03-29 NOTE — Anesthesia Procedure Notes (Signed)
Central Venous Catheter Insertion Performed by: Santa Lighter, MD, anesthesiologist Start/End1/15/2024 12:30 PM, 03/29/2022 12:40 PM Patient location: Pre-op. Preanesthetic checklist: patient identified, IV checked, site marked, risks and benefits discussed, surgical consent, monitors and equipment checked, pre-op evaluation, timeout performed and anesthesia consent Position: Trendelenburg Lidocaine 1% used for infiltration and patient sedated Hand hygiene performed , maximum sterile barriers used  and Seldinger technique used Catheter size: 8 Fr Total catheter length 16. Central line was placed.Double lumen Procedure performed using ultrasound guided technique. Ultrasound Notes:anatomy identified, needle tip was noted to be adjacent to the nerve/plexus identified, no ultrasound evidence of intravascular and/or intraneural injection and image(s) printed for medical record Attempts: 1 Following insertion, line sutured, dressing applied and Biopatch. Post procedure assessment: blood return through all ports, free fluid flow and no air  Patient tolerated the procedure well with no immediate complications.

## 2022-03-29 NOTE — Progress Notes (Signed)
  Transition of Care [facility]) Screening Note   Patient Details  Name: LTANYA BAYLEY Date of Birth: 01-Oct-1940   Transition of Care [facility]) CM/SW Contact:    Roseanne Kaufman, RN Phone Number: 03/29/2022, 5:21 PM    Transition of Care Department [facility]) has reviewed patient and no TOC needs have been identified at this time. We will continue to monitor patient advancement through interdisciplinary progression rounds. If new patient transition needs arise, please place a TOC consult.

## 2022-03-29 NOTE — Progress Notes (Signed)
NAME:  Lauren [name], MRN:  [id number], DOB:  [birth year], LOS: 1 ADMISSION DATE:  03/27/2022, CONSULTATION DATE:  03/29/22 REFERRING MD:  Dr. Olevia Bowens, CHIEF COMPLAINT:  abdominal pain   History of Present Illness:  82 year old woman admitted to hospital with abdominal pain, several days of poor p.o. intake found to have cholangitis versus cholecystitis.  History largely per son at bedside.  Several days of poor appetite.  Not eating very much.  Abdominal pain worsened after eating bacon.  Presented to the ED.  They are ultrasound with dilated bile duct, sludge, gallbladder thickening.  MRCP subsequently obtained.  This demonstrated concern for cholangitis versus cholecystitis.  Surgery and GI were consulted.  Felt cholecystitis was less likely, most likely cholangitis.  Plan for ERCP 1/15.  This afternoon, more hypotensive.  Not responding to fluids.  Transferred to the stepdown unit.  Labs notable for creatinine doubled.  LFTs essentially stable with rising T. bili.  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   Significant Hospital Events: Including procedures, antibiotic start and stop dates in addition to other pertinent events   03/28/2021 admitted to the hospital after a week of poor p.o. intake, with abdominal pain, likely cholangitis versus cholecystitis  Interim History / Subjective:  Pt required levophed overnight  Plan for ERCP today  Objective   Blood pressure (!) 120/52, pulse 95, temperature 97.7 F (36.5  C), temperature source Oral, resp. rate 16, SpO2 100 %.        Intake/Output Summary (Last 24 hours) at 03/29/2022 1051 Last data filed at 03/29/2022 0836 Gross per 24 hour  Intake 4857.33 ml  Output 180 ml  Net 4677.33 ml    There were no vitals filed for this visit.  General:  thin, elderly F resting in bed in no acute distress HEENT: MM pink/moist, sclera anicteric Neuro: awake, nodding to questions and moving all extremities CV: s1s2 rrr, no m/r/g PULM:  clear bilaterally with equal chest rise and no accessory muscle use  GI: soft, upper abdominal tenderness to palpation without peritoneal signs  Extremities: warm/dry, no edema  Skin: no rashes or lesions  Resolved Hospital Problem list     Assessment & Plan:    Acute Cholangitis vs Cholecystitis  Elevated transaminases Appreciate surgery, GI assistance.  Cholangitis felt to be more likely than cholecystitis. -continue Zosyn  -NPO, plan for ERCP today -surgery following, will re-eval for lab chole after ERCP -continue Zosyn -follow LFT's, down-trending today, bili up to 4.4  Septic Shock  Secondary to the above -lactic acid down-trending after 2L IVF and albumin overnight  -continue normal saline 50cc/hr -continue peripheral levophed, currently requiring 87mg, MAP goal >65  Acute renal failure Hypokalemia Creatinine up-trending from 0.52 to 1.15 -likely secondary to shock -continue following renal indices after IVF, trend UOP and avoid nephrotoxins -ensure renal perfusion -trend and replete electrolytes prn   Best Practice (right click and "Reselect all SmartList Selections" daily)   Per primary  Labs   CBC: Recent Labs  Lab 03/27/22 1945 03/28/22 1129 03/28/22 1922 03/29/22 0325  WBC 5.2 9.7 14.1* 12.8*  HGB 10.7* 10.9* 11.4* 9.3*  HCT 31.6* 32.7* 32.9* 27.7*  MCV 89.5  93.4 90.6 91.7  PLT 218 118* 61* 54*     Basic Metabolic Panel: Recent Labs  Lab 03/27/22 1945 03/28/22 1129  03/29/22 0325  NA 131* 133* 134*  K 3.4* 2.9* 4.0  CL 97* 98 104  CO2 22 20* 18*  GLUCOSE 156* 197* 140*  BUN 17 25* 33*  CREATININE 0.52 1.00 1.15*  CALCIUM 9.1 8.3* 7.9*  MG  --  1.9  --   PHOS  --  3.8  --     GFR: CrCl cannot be calculated (Unknown ideal weight.). Recent Labs  Lab 03/27/22 1945 03/27/22 2140 03/28/22 1129 03/28/22 1922 03/28/22 2050 03/29/22 0325  WBC 5.2  --  9.7 14.1*  --  12.8*  LATICACIDVEN 2.1* 1.4  --  2.6* 2.3*  --      Liver Function Tests: Recent Labs  Lab 03/27/22 1945 03/28/22 1129 03/29/22 0325  AST 1,803* 1,332* 649*  ALT 546* 752* 582*  ALKPHOS 194* 176* 114  BILITOT 1.3* 3.4* 4.4*  PROT 6.9 5.7* 6.0*  ALBUMIN 3.6 3.1* 3.7    Recent Labs  Lab 03/27/22 1945 03/28/22 1129  LIPASE 61* 93*    Recent Labs  Lab 03/28/22 1129  AMMONIA 26     ABG No results found for: "PHART", "PCO2ART", "PO2ART", "HCO3", "TCO2", "ACIDBASEDEF", "O2SAT"   Coagulation Profile: No results for input(s): "INR", "PROTIME" in the last 168 hours.  Cardiac Enzymes: No results for input(s): "CKTOTAL", "CKMB", "CKMBINDEX", "TROPONINI" in the last 168 hours.  HbA1C: Hgb A1c MFr Bld  Date/Time Value Ref Range Status  02/15/2020 08:27 AM 6.0 (H) 4.8 - 5.6 % Final    Comment:    (NOTE) Pre diabetes:          5.7%-6.4%  Diabetes:              >6.4%  Glycemic control for   <7.0% adults with diabetes     CBG: Recent Labs  Lab 03/28/22 1056 03/28/22 2330 03/29/22 0549  GLUCAP 191* 140* 130*     Review of Systems:   No chest pain.  No orthopnea or PND.  Comprehensive review of systems otherwise negative.  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   Surgical History:   Past Surgical History:  Procedure Laterality Date   COLONOSCOPY     PARTIAL KNEE ARTHROPLASTY Left 02/25/2020   Procedure: UNICOMPARTMENTAL KNEE;  Surgeon: Gaynelle Arabian, MD;  Location: [facility];  Service: Orthopedics;  Laterality: Left;  22mn     Social History:   reports that she has never smoked. She has never used smokeless tobacco. She reports that she does not drink alcohol and does not use drugs.   Family History:  Her family history is negative for Colon cancer.   Allergies Allergies  Allergen Reactions   Pyridium [Phenazopyridine Hcl] Other (See Comments)    Hemolytic anemia   Sulfa Antibiotics     Potential allergy: oxidizing drug: methemoglobinemia/hemolysis on pyridium   Ciprofloxacin Hcl     Sx's almost like a heart attack   Esomeprazole Magnesium Nausea Only    Pain and nausea   Macrodantin [Nitrofurantoin Macrocrystal] Other (See Comments)    GI  Upset   Pantoprazole Sodium Nausea Only    abd pain and nausea   Tramadol Nausea Only     Home Medications  Prior to Admission medications   Medication Sig Start Date End Date Taking? Authorizing Provider  ascorbic acid (VITAMIN C) 500 MG tablet Take 500 mg by mouth daily.    [provider]  cefdinir (OMNICEF) 300 MG capsule Take 1 capsule (300 mg total) by mouth 2 (two) times daily. 11/21/20   Quintella Reichert, MD  Cholecalciferol (VITAMIN D3) 2000 UNITS TABS Take 1 tablet by mouth daily.    [provider]  COVID-19 mRNA Vac-TriS, Pfizer, SUSP injection Inject into the muscle. 09/23/20   Carlyle Basques, MD  glucosamine-chondroitin 500-400 MG tablet Take 1 tablet by mouth 3 (three) times daily.    [provider]  glucose blood (ACCU-CHEK AVIVA PLUS) test strip Use to check blood  sugar 3 times daily. 01/20/22     levothyroxine (SYNTHROID, LEVOTHROID) 75 MCG tablet Take 75 mcg by mouth daily.    [provider]  losartan (COZAAR) 100 MG tablet Take 100 mg by mouth daily.    [provider]  methocarbamol (ROBAXIN) 500 MG tablet Take 1 tablet (500 mg total) by mouth every 6 (six) hours as needed for muscle spasms. 02/26/20   Edmisten, Ok Anis, PA  Multiple Vitamin (MULTIVITAMIN) tablet Take 1 tablet by mouth daily.    [provider]  Omega-3 Fatty Acids (FISH OIL) 500 MG CAPS Take 500 mg by mouth 2 (two) times daily.    [provider]  omeprazole (PRILOSEC) 40 MG capsule Take 1 capsule (40 mg total) by mouth daily. 03/12/15   Pyrtle, Lajuan Lines, MD  ondansetron (ZOFRAN-ODT) 4 MG disintegrating tablet Take 1 tablet (4 mg total) by mouth every 8 (eight) hours as needed for nausea or vomiting. 09/15/21   White, Leitha Schuller, NP  oxyCODONE (OXY IR/ROXICODONE) 5 MG immediate release tablet Take 1-2 tablets (5-10 mg total) by mouth every 6 (six) hours as needed for severe pain. 02/26/20   Edmisten, Kristie L, PA  pravastatin (PRAVACHOL) 40 MG tablet Take 40 mg by mouth daily. 01/12/20   [provider]  sitaGLIPtan-metformin (JANUMET) 50-500 MG per tablet Take 1 tablet by mouth 2 (two) times daily with a meal.    [provider]  vitamin B-12 (CYANOCOBALAMIN) 100 MCG tablet Take 100 mcg by mouth daily.    [provider]     Critical care time:  35 minutes    CRITICAL CARE Performed by: Otilio Carpen Hason Ofarrell   Total critical care time: 35 minutes  Critical care time was exclusive of separately billable procedures and treating other patients.  Critical care was necessary to treat or prevent imminent or life-threatening deterioration.  Critical care was time spent personally by me on the following activities: development of treatment plan with patient and/or surrogate as well as nursing, discussions with consultants,  evaluation of patient's response to treatment, examination of patient, obtaining history from patient or surrogate, ordering and performing treatments and interventions, ordering and review of laboratory studies, ordering and review of radiographic studies, pulse oximetry and re-evaluation of patient's condition.    Otilio Carpen Lalla Laham, PA-C [facility] See Amion for pager If no response to pager , please call [phone] until 7pm After 7:00 pm call Elink  [phone]

## 2022-03-30 DIAGNOSIS — K8309 Other cholangitis: Secondary | ICD-10-CM

## 2022-03-30 LAB — GLUCOSE, CAPILLARY
Glucose-Capillary: 107 mg/dL — ABNORMAL HIGH (ref 70–99)
Glucose-Capillary: 112 mg/dL — ABNORMAL HIGH (ref 70–99)
Glucose-Capillary: 131 mg/dL — ABNORMAL HIGH (ref 70–99)

## 2022-03-30 LAB — CBC
HCT: 25.6 % — ABNORMAL LOW (ref 36.0–46.0)
HCT: 25.9 % — ABNORMAL LOW (ref 36.0–46.0)
Hemoglobin: 8.7 g/dL — ABNORMAL LOW (ref 12.0–15.0)
Hemoglobin: 9 g/dL — ABNORMAL LOW (ref 12.0–15.0)
MCH: 30.5 pg (ref 26.0–34.0)
MCH: 31.1 pg (ref 26.0–34.0)
MCHC: 34 g/dL (ref 30.0–36.0)
MCHC: 34.7 g/dL (ref 30.0–36.0)
MCV: 89.8 fL (ref 80.0–100.0)
MCV: 89.6 fL (ref 80.0–100.0)
Platelets: 50 10*3/uL — ABNORMAL LOW (ref 150–400)
Platelets: 57 10*3/uL — ABNORMAL LOW (ref 150–400)
RBC: 2.85 MIL/uL — ABNORMAL LOW (ref 3.87–5.11)
RBC: 2.89 MIL/uL — ABNORMAL LOW (ref 3.87–5.11)
RDW: 15.8 % — ABNORMAL HIGH (ref 11.5–15.5)
RDW: 15.7 % — ABNORMAL HIGH (ref 11.5–15.5)
WBC: 12.9 10*3/uL — ABNORMAL HIGH (ref 4.0–10.5)
WBC: 15.3 10*3/uL — ABNORMAL HIGH (ref 4.0–10.5)
nRBC: 0 % (ref 0.0–0.2)
nRBC: 0 % (ref 0.0–0.2)

## 2022-03-30 LAB — COMPREHENSIVE METABOLIC PANEL
ALT: 350 U/L — ABNORMAL HIGH (ref 0–44)
AST: 258 U/L — ABNORMAL HIGH (ref 15–41)
Albumin: 2.8 g/dL — ABNORMAL LOW (ref 3.5–5.0)
Alkaline Phosphatase: 100 U/L (ref 38–126)
Anion gap: 9 (ref 5–15)
BUN: 27 mg/dL — ABNORMAL HIGH (ref 8–23)
CO2: 16 mmol/L — ABNORMAL LOW (ref 22–32)
Calcium: 7 mg/dL — ABNORMAL LOW (ref 8.9–10.3)
Chloride: 111 mmol/L (ref 98–111)
Creatinine, Ser: 1.15 mg/dL — ABNORMAL HIGH (ref 0.44–1.00)
GFR, Estimated: 47 mL/min — ABNORMAL LOW (ref >60)
Glucose, Bld: 116 mg/dL — ABNORMAL HIGH (ref 70–99)
Potassium: 3.4 mmol/L — ABNORMAL LOW (ref 3.5–5.1)
Sodium: 136 mmol/L (ref 135–145)
Total Bilirubin: 2.8 mg/dL — ABNORMAL HIGH (ref 0.3–1.2)
Total Protein: 4.5 g/dL — ABNORMAL LOW (ref 6.5–8.1)

## 2022-03-30 LAB — URINE CULTURE: Culture: >=100000 — AB

## 2022-03-30 LAB — MAGNESIUM: Magnesium: 1.8 mg/dL (ref 1.7–2.4)

## 2022-03-30 MED ORDER — MAGNESIUM SULFATE 2 GM/50ML IV SOLN
2.0000 g | Freq: Once | INTRAVENOUS | Status: AC
  Administered 2022-03-30: 2 g via INTRAVENOUS
  Filled 2022-03-30: qty 50

## 2022-03-30 MED ORDER — POTASSIUM CHLORIDE CRYS ER 20 MEQ PO TBCR
40.0000 meq | EXTENDED_RELEASE_TABLET | Freq: Once | ORAL | Status: AC
  Administered 2022-03-30: 40 meq via ORAL
  Filled 2022-03-30: qty 2

## 2022-03-30 MED ORDER — LEVOTHYROXINE SODIUM 75 MCG PO TABS
75.0000 ug | ORAL_TABLET | Freq: Every day | ORAL | Status: DC
  Administered 2022-03-30 – 2022-04-09 (×10): 75 ug via ORAL
  Filled 2022-03-30 (×11): qty 1

## 2022-03-30 MED ORDER — CLONAZEPAM 0.5 MG PO TBDP
0.5000 mg | ORAL_TABLET | Freq: Three times a day (TID) | ORAL | Status: DC | PRN
  Administered 2022-03-30: 0.5 mg via ORAL
  Filled 2022-03-30: qty 1

## 2022-03-30 NOTE — Anesthesia Postprocedure Evaluation (Signed)
Anesthesia Post Note  Patient: [name]  Procedure(s) Performed: ENDOSCOPIC RETROGRADE CHOLANGIOPANCREATOGRAPHY (ERCP) SPHINCTEROTOMY REMOVAL OF STONES     Patient location during evaluation: PACU Anesthesia Type: General Level of consciousness: awake and alert Pain management: pain level controlled Vital Signs Assessment: post-procedure vital signs reviewed and stable Respiratory status: spontaneous breathing, nonlabored ventilation, respiratory function stable and patient connected to nasal cannula oxygen Cardiovascular status: blood pressure returned to baseline and stable Postop Assessment: no apparent nausea or vomiting Anesthetic complications: no   No notable events documented.  Last Vitals:  Vitals:   03/30/22 0715 03/30/22 0800  BP:    Pulse: 100   Resp: (!) 21   Temp:  36.8 C  SpO2: 100%     Last Pain:  Vitals:   03/30/22 0800  TempSrc: Axillary  PainSc:                  Lauren [name]

## 2022-03-30 NOTE — Progress Notes (Addendum)
Daily Progress Note  Hospital Day: 4  Chief Complaint: cholangitis  Assessment   Brief Narrative:  Lauren [name] is a 82 y.o. [place] female with a pmh of aortic atherosclerosis, hyperlipidemia, osteoarthritis, type 2 diabetes, hypothyroidism, diverticulosis, GERD, IDA, history of hemolytic anemia, hypertension, hypothyroidism, osteoporosis, UTI, vitamin D deficiency  Admitted 1/14 with septic shock, abnormal liver chemistries, abdominal pain.  MRCP concerning for acute cholecystitis / biliary sludge with duct dilation. Picture concerning for cholangitis.   # Cholangitis. Resolving. She is s/p ERCP with sphincterotomy and removal of copious amount of dark sludge and mucopurulent debris from bile duct 03/29/22.  Improving. Off pressors now.  Afebrile. WBC stable at 12.9. Liver chemistries improving. On Zosyn. No abdominal pain today.   # AKI. Creatinine 1.15 up from baseline of 0.5  #Chronic [place] anemia. Previously evaluated by [place] in 2017 for IDA. Hgb down to 8.7 but suspect hemodilution as she received fluid resuscitation.   Plan:    Continue antibiotics Zofran as needed Surgery planning for lap chole tomorrow  ------------------------------------------------------------------------------------------------------------------ GI attending:  I have also seen and evaluated the patient.  She is improved after ERCP and sphincterotomy for treatment of cholangitis.  Cholecystectomy planned for tomorrow.  GI signing off.  Gatha Mayer, MD, [facility] See [name] on call - gastroenterology for best contact person 03/30/2022 4:13 PM   Subjective   No abdominal pain today. Taking clears, had mild nausea. Had BM earlier today per son   Objective   Imaging:  DG ERCP  Result Date: 03/29/2022 CLINICAL DATA:  ERCP EXAM: ERCP TECHNIQUE: Multiple spot images obtained with the fluoroscopic device and submitted for interpretation post-procedure. FLUOROSCOPY TIME:  FLUOROSCOPY TIME 1 minute, 10 seconds (30.7 mGy) COMPARISON:  MRCP-03/28/2022 FINDINGS: Six spot intraoperative fluoroscopic images of the right upper abdominal quadrant during ERCP are provided for review. Initial image demonstrates an ERCP probe overlying the right upper abdominal quadrant. Subsequent images demonstrate selective cannulation and opacification of the common bile duct which appears mild to moderately dilated and potentially slightly irregular though this is potentially artifactual due to opacification. Subsequent images demonstrate insufflation of a balloon within the central aspect of the CBD with subsequent biliary sweeping and presumed sphincterotomy. There is minimal opacification of the intrahepatic biliary tree which appears mildly dilated. There is no definitive opacification of either the cystic or pancreatic ducts. IMPRESSION: ERCP with biliary sweeping and presumed sphincterotomy as above. These images were submitted for radiologic interpretation only. Please see the procedural report for the amount of contrast and the fluoroscopy time utilized. Electronically Signed   By: Sandi Mariscal M.D.   On: 03/29/2022 14:13   MR ABDOMEN MRCP W WO CONTAST  Result Date: 03/28/2022 CLINICAL DATA:  Severe epigastric pain. Elevated liver function tests. Biliary ductal dilatation on recent ultrasound. EXAM: MRI ABDOMEN WITHOUT AND WITH CONTRAST (INCLUDING MRCP) TECHNIQUE: Multiplanar multisequence MR imaging of the abdomen was performed both before and after the administration of intravenous contrast. Heavily T2-weighted images of the biliary and pancreatic ducts were obtained, and three-dimensional MRCP images were rendered by post processing. CONTRAST:  42m GADAVIST GADOBUTROL 1 MMOL/ML IV SOLN COMPARISON:  Ultrasound on 03/28/2022 and CT on 03/27/2022 FINDINGS: Lower chest: Mild dependent bibasilar atelectasis. Hepatobiliary: Several small subcapsular wedge-shaped signal abnormalities are seen in the  anterior segment of the right hepatic lobe and medial segment of the left hepatic lobe, which show moderate T2 hyperintensity and lack of contrast enhancement. These are nonspecific and could represent  small hepatic infarcts or abscesses. Mild diffuse hepatic steatosis is noted. Mild perihepatic ascites is seen. Gallbladder is mildly distended, and shows mild wall thickening and pericholecystic edema. These findings are suspicious for acute cholecystitis. Mild dilatation of the central intrahepatic bile ducts and common bile duct is seen the level of the ampulla, with common bile duct measuring up to 10 mm. Diffuse T2 hypointensity is noted within the common duct suspicious for sludge, however no discrete common duct calculi are identified. Pancreas: No mass or inflammatory changes. No evidence of pancreatic ductal dilatation or pancreas divisum. Spleen:  Within normal limits in size and appearance. Adrenals/Urinary Tract: No suspicious masses identified. No evidence of hydronephrosis. Stomach/Bowel: Unremarkable. Vascular/Lymphatic: No pathologically enlarged lymph nodes identified. No acute vascular findings. Other:  None. Musculoskeletal:  No suspicious bone lesions identified. IMPRESSION: Mild gallbladder wall thickening and pericholecystic edema, highly suspicious for acute cholecystitis. Mild dilatation of the central intrahepatic bile ducts and common bile duct to the level of the ampulla. T2 hypointense sludge seen throughout the common bile duct, without definite calculi. Cholangitis cannot be excluded. Several small subcapsular wedge-shaped signal abnormalities in the peripheral right and left hepatic lobes. These are nonspecific, and differential diagnosis includes small hepatic infarcts or abscesses. Mild hepatic steatosis.  Mild perihepatic ascites. Electronically Signed   By: Marlaine Hind M.D.   On: 03/28/2022 15:17   MR 3D Recon At Scanner  Result Date: 03/28/2022 CLINICAL DATA:  Severe epigastric  pain. Elevated liver function tests. Biliary ductal dilatation on recent ultrasound. EXAM: MRI ABDOMEN WITHOUT AND WITH CONTRAST (INCLUDING MRCP) TECHNIQUE: Multiplanar multisequence MR imaging of the abdomen was performed both before and after the administration of intravenous contrast. Heavily T2-weighted images of the biliary and pancreatic ducts were obtained, and three-dimensional MRCP images were rendered by post processing. CONTRAST:  87m GADAVIST GADOBUTROL 1 MMOL/ML IV SOLN COMPARISON:  Ultrasound on 03/28/2022 and CT on 03/27/2022 FINDINGS: Lower chest: Mild dependent bibasilar atelectasis. Hepatobiliary: Several small subcapsular wedge-shaped signal abnormalities are seen in the anterior segment of the right hepatic lobe and medial segment of the left hepatic lobe, which show moderate T2 hyperintensity and lack of contrast enhancement. These are nonspecific and could represent small hepatic infarcts or abscesses. Mild diffuse hepatic steatosis is noted. Mild perihepatic ascites is seen. Gallbladder is mildly distended, and shows mild wall thickening and pericholecystic edema. These findings are suspicious for acute cholecystitis. Mild dilatation of the central intrahepatic bile ducts and common bile duct is seen the level of the ampulla, with common bile duct measuring up to 10 mm. Diffuse T2 hypointensity is noted within the common duct suspicious for sludge, however no discrete common duct calculi are identified. Pancreas: No mass or inflammatory changes. No evidence of pancreatic ductal dilatation or pancreas divisum. Spleen:  Within normal limits in size and appearance. Adrenals/Urinary Tract: No suspicious masses identified. No evidence of hydronephrosis. Stomach/Bowel: Unremarkable. Vascular/Lymphatic: No pathologically enlarged lymph nodes identified. No acute vascular findings. Other:  None. Musculoskeletal:  No suspicious bone lesions identified. IMPRESSION: Mild gallbladder wall thickening and  pericholecystic edema, highly suspicious for acute cholecystitis. Mild dilatation of the central intrahepatic bile ducts and common bile duct to the level of the ampulla. T2 hypointense sludge seen throughout the common bile duct, without definite calculi. Cholangitis cannot be excluded. Several small subcapsular wedge-shaped signal abnormalities in the peripheral right and left hepatic lobes. These are nonspecific, and differential diagnosis includes small hepatic infarcts or abscesses. Mild hepatic steatosis.  Mild perihepatic ascites. Electronically Signed  By: Marlaine Hind M.D.   On: 03/28/2022 15:17    Lab Results: Recent Labs    03/28/22 1922 03/29/22 0325 03/30/22 0500  WBC 14.1* 12.8* 12.9*  HGB 11.4* 9.3* 8.7*  HCT 32.9* 27.7* 25.6*  PLT 61* 54* 50*   BMET Recent Labs    03/28/22 1129 03/29/22 0325 03/30/22 0500  NA 133* 134* 136  K 2.9* 4.0 3.4*  CL 98 104 111  CO2 20* 18* 16*  GLUCOSE 197* 140* 116*  BUN 25* 33* 27*  CREATININE 1.00 1.15* 1.15*  CALCIUM 8.3* 7.9* 7.0*   LFT Recent Labs    03/30/22 0500  PROT 4.5*  ALBUMIN 2.8*  AST 258*  ALT 350*  ALKPHOS 100  BILITOT 2.8*     Scheduled inpatient medications:   Chlorhexidine Gluconate Cloth  6 each Topical Q2200   levothyroxine  75 mcg Oral Q0600   lip balm   Topical BID   polyethylene glycol  17 g Oral Daily   Continuous inpatient infusions:   sodium chloride     sodium chloride Stopped (03/29/22 1113)   0.9 % NaCl with KCl 20 mEq / L 50 mL/hr at 03/29/22 2144   famotidine (PEPCID) IV Stopped (03/29/22 2315)   lactated ringers     methocarbamol (ROBAXIN) IV     ondansetron (ZOFRAN) IV     piperacillin-tazobactam (ZOSYN)  IV Stopped (03/30/22 0641)   PRN inpatient medications: alum & mag hydroxide-simeth, bisacodyl, fentaNYL (SUBLIMAZE) injection, iohexol, lactated ringers, magic mouthwash, menthol-cetylpyridinium, methocarbamol (ROBAXIN) IV, naloxone, ondansetron (ZOFRAN) IV **OR** ondansetron  (ZOFRAN) IV, mouth rinse, phenol, prochlorperazine  Vital signs in last 24 hours: Temp:  [97.7 F (36.5 C)-98.6 F (37 C)] 98.3 F (36.8 C) (01/16 0800) Pulse Rate:  [84-115] 100 (01/16 0715) Resp:  [11-29] 21 (01/16 0715) BP: (118-160)/(41-65) 139/46 (01/15 1430) SpO2:  [93 %-100 %] 100 % (01/16 0715) Arterial Line BP: (90-287)/(42-283) 152/66 (01/16 0715) Last BM Date : 03/27/22  Intake/Output Summary (Last 24 hours) at 03/30/2022 1042 Last data filed at 03/29/2022 1543 Gross per 24 hour  Intake 1417.37 ml  Output 355 ml  Net 1062.37 ml    Intake/Output from previous day: 01/15 0701 - 01/16 0700 In: 2632.1 [I.V.:1582.5; IV Piggyback:1049.6] Out: 355 [Urine:355] Intake/Output this shift: No intake/output data recorded.   Physical Exam:  General: Alert female in NAD. Son in room. She looks  much better today.  Heart:  Regular rate and rhythm.  Pulmonary: Normal respiratory effort Abdomen: Soft, midly distended with tympany.  Normal bowel sounds. Extremities: No lower extremity edema  Neurologic: Alert and oriented Psych: Pleasant. Cooperative.    Principal Problem:   Ascending cholangitis Active Problems:   HTN (hypertension)   Hyperlipidemia   Type 2 diabetes mellitus (HCC)   GERD (gastroesophageal reflux disease)   Hypothyroidism   Nausea without vomiting   Chronic kidney disease, stage 2 (mild)   Iron deficiency anemia   Recurrent urinary tract infection   Vertigo   Hepatitis   Hypokalemia   LFT elevation   Shock ([place])     LOS: 2 days   Tye Savoy ,NP 03/30/2022, 10:42 AM

## 2022-03-30 NOTE — Progress Notes (Signed)
eLink Physician-Brief Progress Note Patient Name: Lauren [name] DOB: 1940/06/02 MRN: [id number]   Date of Service  03/30/2022  HPI/Events of Note  Patient admitted with septic shock of biliary tract origin, now has agitated delirium and has already pulled out an arterial line and several IV's, she needs restraints to prevent her from pulling out her central line.  eICU Interventions  Bilateral soft wrist restraints ordered.        [name] 03/30/2022, 7:58 PM

## 2022-03-30 NOTE — Progress Notes (Signed)
[facility] ADULT ICU REPLACEMENT PROTOCOL   The patient does apply for the [facility] Adult ICU Electrolyte Replacment Protocol based on the criteria listed below:   1.Exclusion criteria: TCTS, ECMO, Dialysis, and Myasthenia Gravis patients 2. Is GFR >/= 30 ml/min? Yes.    Patient's GFR today is 47 3. Is SCr </= 2? Yes.   Patient's SCr is 1.15 mg/dL 4. Did SCr increase >/= 0.5 in 24 hours? No. 5.Pt's weight >40kg  Yes.   6. Abnormal electrolyte(s): potassium 3.4, mag 1.8  7. Electrolytes replaced per protocol 8.  Call MD STAT for K+ </= 2.5, Phos </= 1, or Mag </= 1 Physician:  Theador Hawthorne 03/30/2022 6:36 AM

## 2022-03-30 NOTE — Progress Notes (Signed)
1 Day Post-Op   Subjective/Chief Complaint: Pt s/p ERCP note reviewed. Off Pressors   Objective: Vital signs in last 24 hours: Temp:  [97.7 F (36.5 C)-98.6 F (37 C)] 97.7 F (36.5 C) (01/16 0414) Pulse Rate:  [84-115] 100 (01/16 0715) Resp:  [11-29] 21 (01/16 0715) BP: (91-160)/(40-65) 139/46 (01/15 1430) SpO2:  [93 %-100 %] 100 % (01/16 0715) Arterial Line BP: (90-287)/(42-283) 152/66 (01/16 0715) Last BM Date : 03/27/22  Intake/Output from previous day: 01/15 0701 - 01/16 0700 In: 2632.1 [I.V.:1582.5; IV Piggyback:1049.6] Out: 355 [Urine:355] Intake/Output this shift: No intake/output data recorded.  PE:  Constitutional: No acute distress, conversant, appears states age. Eyes: Anicteric sclerae, moist conjunctiva, no lid lag Lungs: Clear to auscultation bilaterally, normal respiratory effort CV: regular rate and rhythm, no murmurs, no peripheral edema, pedal pulses 2+ GI: Soft, no masses or hepatosplenomegaly, non-tender to palpation Skin: No rashes, palpation reveals normal turgor Psychiatric: appropriate judgment and insight, oriented to person, place, and time   Lab Results:  Recent Labs    03/29/22 0325 03/30/22 0500  WBC 12.8* 12.9*  HGB 9.3* 8.7*  HCT 27.7* 25.6*  PLT 54* 50*   BMET Recent Labs    03/29/22 0325 03/30/22 0500  NA 134* 136  K 4.0 3.4*  CL 104 111  CO2 18* 16*  GLUCOSE 140* 116*  BUN 33* 27*  CREATININE 1.15* 1.15*  CALCIUM 7.9* 7.0*   PT/INR No results for input(s): "LABPROT", "INR" in the last 72 hours. ABG No results for input(s): "PHART", "HCO3" in the last 72 hours.  Invalid input(s): "PCO2", "PO2"  Studies/Results: DG ERCP  Result Date: 03/29/2022 CLINICAL DATA:  ERCP EXAM: ERCP TECHNIQUE: Multiple spot images obtained with the fluoroscopic device and submitted for interpretation post-procedure. FLUOROSCOPY TIME: FLUOROSCOPY TIME 1 minute, 10 seconds (30.7 mGy) COMPARISON:  MRCP-03/28/2022 FINDINGS: Six spot  intraoperative fluoroscopic images of the right upper abdominal quadrant during ERCP are provided for review. Initial image demonstrates an ERCP probe overlying the right upper abdominal quadrant. Subsequent images demonstrate selective cannulation and opacification of the common bile duct which appears mild to moderately dilated and potentially slightly irregular though this is potentially artifactual due to opacification. Subsequent images demonstrate insufflation of a balloon within the central aspect of the CBD with subsequent biliary sweeping and presumed sphincterotomy. There is minimal opacification of the intrahepatic biliary tree which appears mildly dilated. There is no definitive opacification of either the cystic or pancreatic ducts. IMPRESSION: ERCP with biliary sweeping and presumed sphincterotomy as above. These images were submitted for radiologic interpretation only. Please see the procedural report for the amount of contrast and the fluoroscopy time utilized. Electronically Signed   By: Sandi Mariscal M.D.   On: 03/29/2022 14:13   MR ABDOMEN MRCP W WO CONTAST  Result Date: 03/28/2022 CLINICAL DATA:  Severe epigastric pain. Elevated liver function tests. Biliary ductal dilatation on recent ultrasound. EXAM: MRI ABDOMEN WITHOUT AND WITH CONTRAST (INCLUDING MRCP) TECHNIQUE: Multiplanar multisequence MR imaging of the abdomen was performed both before and after the administration of intravenous contrast. Heavily T2-weighted images of the biliary and pancreatic ducts were obtained, and three-dimensional MRCP images were rendered by post processing. CONTRAST:  75m GADAVIST GADOBUTROL 1 MMOL/ML IV SOLN COMPARISON:  Ultrasound on 03/28/2022 and CT on 03/27/2022 FINDINGS: Lower chest: Mild dependent bibasilar atelectasis. Hepatobiliary: Several small subcapsular wedge-shaped signal abnormalities are seen in the anterior segment of the right hepatic lobe and medial segment of the left hepatic lobe, which  show moderate  T2 hyperintensity and lack of contrast enhancement. These are nonspecific and could represent small hepatic infarcts or abscesses. Mild diffuse hepatic steatosis is noted. Mild perihepatic ascites is seen. Gallbladder is mildly distended, and shows mild wall thickening and pericholecystic edema. These findings are suspicious for acute cholecystitis. Mild dilatation of the central intrahepatic bile ducts and common bile duct is seen the level of the ampulla, with common bile duct measuring up to 10 mm. Diffuse T2 hypointensity is noted within the common duct suspicious for sludge, however no discrete common duct calculi are identified. Pancreas: No mass or inflammatory changes. No evidence of pancreatic ductal dilatation or pancreas divisum. Spleen:  Within normal limits in size and appearance. Adrenals/Urinary Tract: No suspicious masses identified. No evidence of hydronephrosis. Stomach/Bowel: Unremarkable. Vascular/Lymphatic: No pathologically enlarged lymph nodes identified. No acute vascular findings. Other:  None. Musculoskeletal:  No suspicious bone lesions identified. IMPRESSION: Mild gallbladder wall thickening and pericholecystic edema, highly suspicious for acute cholecystitis. Mild dilatation of the central intrahepatic bile ducts and common bile duct to the level of the ampulla. T2 hypointense sludge seen throughout the common bile duct, without definite calculi. Cholangitis cannot be excluded. Several small subcapsular wedge-shaped signal abnormalities in the peripheral right and left hepatic lobes. These are nonspecific, and differential diagnosis includes small hepatic infarcts or abscesses. Mild hepatic steatosis.  Mild perihepatic ascites. Electronically Signed   By: Marlaine Hind M.D.   On: 03/28/2022 15:17   MR 3D Recon At Scanner  Result Date: 03/28/2022 CLINICAL DATA:  Severe epigastric pain. Elevated liver function tests. Biliary ductal dilatation on recent ultrasound. EXAM:  MRI ABDOMEN WITHOUT AND WITH CONTRAST (INCLUDING MRCP) TECHNIQUE: Multiplanar multisequence MR imaging of the abdomen was performed both before and after the administration of intravenous contrast. Heavily T2-weighted images of the biliary and pancreatic ducts were obtained, and three-dimensional MRCP images were rendered by post processing. CONTRAST:  59m GADAVIST GADOBUTROL 1 MMOL/ML IV SOLN COMPARISON:  Ultrasound on 03/28/2022 and CT on 03/27/2022 FINDINGS: Lower chest: Mild dependent bibasilar atelectasis. Hepatobiliary: Several small subcapsular wedge-shaped signal abnormalities are seen in the anterior segment of the right hepatic lobe and medial segment of the left hepatic lobe, which show moderate T2 hyperintensity and lack of contrast enhancement. These are nonspecific and could represent small hepatic infarcts or abscesses. Mild diffuse hepatic steatosis is noted. Mild perihepatic ascites is seen. Gallbladder is mildly distended, and shows mild wall thickening and pericholecystic edema. These findings are suspicious for acute cholecystitis. Mild dilatation of the central intrahepatic bile ducts and common bile duct is seen the level of the ampulla, with common bile duct measuring up to 10 mm. Diffuse T2 hypointensity is noted within the common duct suspicious for sludge, however no discrete common duct calculi are identified. Pancreas: No mass or inflammatory changes. No evidence of pancreatic ductal dilatation or pancreas divisum. Spleen:  Within normal limits in size and appearance. Adrenals/Urinary Tract: No suspicious masses identified. No evidence of hydronephrosis. Stomach/Bowel: Unremarkable. Vascular/Lymphatic: No pathologically enlarged lymph nodes identified. No acute vascular findings. Other:  None. Musculoskeletal:  No suspicious bone lesions identified. IMPRESSION: Mild gallbladder wall thickening and pericholecystic edema, highly suspicious for acute cholecystitis. Mild dilatation of the  central intrahepatic bile ducts and common bile duct to the level of the ampulla. T2 hypointense sludge seen throughout the common bile duct, without definite calculi. Cholangitis cannot be excluded. Several small subcapsular wedge-shaped signal abnormalities in the peripheral right and left hepatic lobes. These are nonspecific, and differential diagnosis includes small  hepatic infarcts or abscesses. Mild hepatic steatosis.  Mild perihepatic ascites. Electronically Signed   By: Marlaine Hind M.D.   On: 03/28/2022 15:17   US Abdomen Limited RUQ (LIVER/GB)  Result Date: 03/28/2022 CLINICAL DATA:  Acute severe epigastric pain. Elevated liver function tests. EXAM: ULTRASOUND ABDOMEN LIMITED RIGHT UPPER QUADRANT COMPARISON:  CTA on 03/27/2022 FINDINGS: Gallbladder: Small amount of echogenic sludge seen within the gallbladder, however no definite gallstones identified. Mild diffuse gallbladder wall thickening is seen measuring to 5 mm. No sonographic Murphy sign noted by sonographer. Common bile duct: Diameter: 10 mm, which is mildly dilated. Echogenic sludge noted within the common bile duct, however no definite intraductal calculi are seen by ultrasound. No evidence of intrahepatic biliary ductal dilatation. Liver: Mildly increased echogenicity of the hepatic parenchyma, consistent with hepatic steatosis. No hepatic mass identified. Portal vein is patent on color Doppler imaging with normal direction of blood flow towards the liver. Other: None. IMPRESSION: Small amount of gallbladder sludge, however no definite gallstones identified. Mild diffuse gallbladder wall thickening, which is nonspecific. This could be secondary to hepatocellular disease, although cholecystitis cannot be excluded. Recommend clinical correlation and consider nuclear medicine hepatobiliary scan if clinically warranted. Mild dilatation of common bile duct measuring 10 mm, which contains sludge. Consider further evaluation with abdomen MRI and  MRCP without and with contrast to evaluate for choledocholithiasis if clinically warranted. Mild hepatic steatosis. Electronically Signed   By: Marlaine Hind M.D.   On: 03/28/2022 10:04    Anti-infectives: Anti-infectives (From admission, onward)    Start     Dose/Rate Route Frequency Ordered Stop   03/29/22 2100  piperacillin-tazobactam (ZOSYN) IVPB 3.375 g  Status:  Discontinued        3.375 g 12.5 mL/hr over 240 Minutes Intravenous Every 8 hours 03/28/22 2051 03/29/22 0947   03/29/22 1000  piperacillin-tazobactam (ZOSYN) IVPB 3.375 g        3.375 g 12.5 mL/hr over 240 Minutes Intravenous Every 8 hours 03/29/22 0947     03/29/22 0800  cefTRIAXone (ROCEPHIN) 2 g in sodium chloride 0.9 % 100 mL IVPB  Status:  Discontinued        2 g 200 mL/hr over 30 Minutes Intravenous Every 24 hours 03/28/22 1058 03/28/22 1212   03/29/22 0800  cefTRIAXone (ROCEPHIN) 1 g in sodium chloride 0.9 % 100 mL IVPB  Status:  Discontinued        1 g 200 mL/hr over 30 Minutes Intravenous Every 24 hours 03/28/22 1212 03/28/22 1603   03/28/22 1700  piperacillin-tazobactam (ZOSYN) IVPB 3.375 g  Status:  Discontinued        3.375 g 12.5 mL/hr over 240 Minutes Intravenous Every 8 hours 03/28/22 1603 03/28/22 2051   03/28/22 1115  metroNIDAZOLE (FLAGYL) IVPB 500 mg  Status:  Discontinued        500 mg 100 mL/hr over 60 Minutes Intravenous 2 times daily 03/28/22 1058 03/28/22 1609   03/28/22 0645  cefTRIAXone (ROCEPHIN) 1 g in sodium chloride 0.9 % 100 mL IVPB        1 g 200 mL/hr over 30 Minutes Intravenous  Once 03/28/22 8295 03/28/22 0801       Assessment/Plan: 1F with cholangitis and choledocholithiasis -plan for OR on Wed, lap chole -con't abx   LOS: 2 days    Ralene Ok 03/30/2022

## 2022-03-30 NOTE — Evaluation (Signed)
Physical Therapy Evaluation Patient Details Name: Lauren [name] MRN: [id number] DOB: [birth year] Today's Date: 03/30/2022  History of Present Illness  Patient is 82 y.o. female admitted to hospital with abdominal pain, several days of poor p.o. intake found to have cholangitis versus cholecystitis. Pt s/p ERCP on 03/29/22 with plan for lap chole on 03/31/22. PMH significant for OA, DM, GERD, HLD, HTN, hypothyroidism, osteoporosis, Lt [facility] in 2021.   Clinical Impression  Lauren [name] is 82 y.o. female admitted with above HPI and diagnosis. Patient is currently limited by functional impairments below (see PT problem list). Patient lives alone and is independent at baseline. Currently she is slightly impulsive and requires cues for safety with lines and Mod assist to steady balance with stand pivot to move bed<>BSC and to recliner at EOS. Posture crouched in standing and knees flexed due to weakness but no buckling with small side steps bed>chair. Son present for translation. Patient will benefit from continued skilled PT interventions to address impairments and progress independence with mobility, recommending HHPT with assist from family. Acute PT will follow and progress as able.        Recommendations for follow up therapy are one component of a multi-disciplinary discharge planning process, led by the attending physician.  Recommendations may be updated based on patient status, additional functional criteria and insurance authorization.  Follow Up Recommendations Home health PT      Assistance Recommended at Discharge Frequent or constant Supervision/Assistance  Patient can return home with the following  A lot of help with walking and/or transfers;A lot of help with bathing/dressing/bathroom;Assistance with cooking/housework;Direct supervision/assist for medications management;Assist for transportation;Help with stairs or ramp for entrance    Equipment Recommendations None recommended by PT   Recommendations for Other Services       Functional Status Assessment Patient has had a recent decline in their functional status and demonstrates the ability to make significant improvements in function in a reasonable and predictable amount of time.     Precautions / Restrictions Precautions Precautions: Fall Precaution Comments: CVC, IV, foley Restrictions Weight Bearing Restrictions: No      Mobility  Bed Mobility Overal bed mobility: Needs Assistance Bed Mobility: Supine to Sit     Supine to sit: Min assist, Mod assist, +2 for safety/equipment     General bed mobility comments: multimodal cues needed for safety with supine>sit, pt required assist and cues for safety with lines frequently. pt initaited bring LE's to EOB, min-mod assist to pivot and scoot to EOB.    Transfers Overall transfer level: Needs assistance Equipment used: 1 person hand held assist Transfers: Sit to/from Stand, Bed to chair/wheelchair/BSC Sit to Stand: Mod assist   Step pivot transfers: Mod assist       General transfer comment: Mod Assist for rise and steady from EOB. Pt with crouched posture and reaching to move BSC and lines with multimodal cues needed for safety. Mod assist to guide pivot to sit on BSC for BM. Mod Assist with rise to complete pericare and to guide turn back to EOB. Pt required direction to transfer to recliner for OOB and Mos assist provided to take small pivot steps.    Ambulation/Gait                  Stairs            Wheelchair Mobility    Modified Rankin (Stroke Patients Only)       Balance Overall balance assessment: Needs assistance Sitting-balance support:  Feet supported Sitting balance-Leahy Scale: Fair     Standing balance support: During functional activity, Bilateral upper extremity supported Standing balance-Leahy Scale: Poor Standing balance comment: reliant on support of therapist                              Pertinent Vitals/Pain Pain Assessment Pain Assessment: No/denies pain    Home Living Family/patient expects to be discharged to:: Private residence Living Arrangements: Alone Available Help at Discharge: Family Type of Home: House Home Access: Level entry       Home Layout: One level Home Equipment: Conservation officer, nature (2 wheels);Cane - single point      Prior Function Prior Level of Function : Independent/Modified Independent;Driving                     Hand Dominance        Extremity/Trunk Assessment   Upper Extremity Assessment Upper Extremity Assessment: Defer to OT evaluation    Lower Extremity Assessment Lower Extremity Assessment: Generalized weakness    Cervical / Trunk Assessment Cervical / Trunk Assessment: Normal  Communication   Communication: Prefers language other than [place];Interpreter utilized (pt's son translating)  Cognition Arousal/Alertness: Awake/alert Behavior During Therapy: WFL for tasks assessed/performed Overall Cognitive Status: Difficult to assess                                 General Comments: pt overall status seems WFL's, son does not express concerns. pt is slightly impulsive and figets with lines requirign cues for safety.        General Comments      Exercises     Assessment/Plan    PT Assessment Patient needs continued PT services  PT Problem List Decreased strength;Decreased activity tolerance;Decreased balance;Decreased mobility;Decreased knowledge of use of DME;Decreased safety awareness;Decreased knowledge of precautions       PT Treatment Interventions DME instruction;Neuromuscular re-education;Balance training;Therapeutic exercise;Therapeutic activities;Functional mobility training;Stair training;Gait training;Patient/family education    PT Goals (Current goals can be found in the Care Plan section)  Acute Rehab PT Goals Patient Stated Goal: get better after surgery and get home PT  Goal Formulation: With patient Time For Goal Achievement: 04/13/22 Potential to Achieve Goals: Good    Frequency Min 3X/week     Co-evaluation               AM-PAC PT "6 Clicks" Mobility  Outcome Measure Help needed turning from your back to your side while in a flat bed without using bedrails?: A Little Help needed moving from lying on your back to sitting on the side of a flat bed without using bedrails?: A Little Help needed moving to and from a bed to a chair (including a wheelchair)?: A Lot Help needed standing up from a chair using your arms (e.g., wheelchair or bedside chair)?: A Lot Help needed to walk in hospital room?: A Lot Help needed climbing 3-5 steps with a railing? : A Lot 6 Click Score: 14    End of Session Equipment Utilized During Treatment: Gait belt Activity Tolerance: Patient tolerated treatment well Patient left: in chair;with call bell/phone within reach;with chair alarm set;with family/visitor present Nurse Communication: Mobility status PT Visit Diagnosis: Unsteadiness on feet (R26.81);Muscle weakness (generalized) (M62.81);Difficulty in walking, not elsewhere classified (R26.2)    Time: 0623-7628 PT Time Calculation (min) (ACUTE ONLY): 33 min   Charges:  PT Evaluation $PT Eval Moderate Complexity: 1 Mod PT Treatments $Therapeutic Activity: 8-22 mins        Verner Mould, DPT Acute Rehabilitation Services Office [phone]  03/30/22 12:57 PM

## 2022-03-30 NOTE — Progress Notes (Addendum)
NAME:  Lauren [name], MRN:  [id number], DOB:  November 10, 1940, LOS: 2 ADMISSION DATE:  03/27/2022, CONSULTATION DATE:  03/30/22 REFERRING MD:  Dr. Olevia Bowens, CHIEF COMPLAINT:  abdominal pain   History of Present Illness:  82 year old woman admitted to hospital with abdominal pain, several days of poor p.o. intake found to have cholangitis versus cholecystitis.  History largely per son at bedside.  Several days of poor appetite.  Not eating very much.  Abdominal pain worsened after eating bacon.  Presented to the ED.  They are ultrasound with dilated bile duct, sludge, gallbladder thickening.  MRCP subsequently obtained.  This demonstrated concern for cholangitis versus cholecystitis.  Surgery and GI were consulted.  Felt cholecystitis was less likely, most likely cholangitis.  Plan for ERCP 1/15.  This afternoon, more hypotensive.  Not responding to fluids.  Transferred to the stepdown unit.  Labs notable for creatinine doubled.  LFTs essentially stable with rising T. bili.  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   Significant Hospital Events: Including procedures, antibiotic start and stop dates in addition to other pertinent events   03/28/2021 admitted to the hospital after a week of poor p.o. intake, with abdominal pain, likely cholangitis versus cholecystitis, on levophed 1/16 Off levophed, plan for lap chole tomorrow  Interim History / Subjective:  Blood pressure improved and off levophed ERCP consistent with ascending  cholangitis, surgery plans lap chole tomorrow  Episode of dyspnea with exertion overnight now resolved  Objective   Blood pressure (!) 139/46, pulse 100, temperature 98.3 F (36.8 C), temperature source Axillary, resp. rate (!) 21, SpO2 100 %.        Intake/Output Summary (Last 24 hours) at 03/30/2022 0934 Last data filed at 03/29/2022 1543 Gross per 24 hour  Intake 1417.37 ml  Output 355 ml  Net 1062.37 ml    There were no vitals filed for this visit.  General:  thin, elderly F resting in bed in no acute distress HEENT: MM pink/moist, sclera anicteric Neuro: awake, nodding to questions and moving all extremities CV: s1s2 rrr, no m/r/g PULM:  clear bilaterally with equal chest rise and no accessory muscle use  GI: soft, upper abdominal tenderness to palpation without peritoneal signs  Extremities: warm/dry, no edema  Skin: no rashes or lesions  Resolved Hospital Problem list     Assessment & Plan:    Acute Cholangitis  Elevated transaminases Appreciate surgery, GI assistance LFT's and bili improving -plan for lap chole 1/17 -continue Zosyn  -NPO  Septic Shock  Secondary to the above Resolved -continue normal saline 50cc/hr -off levophed, no longer requiring ICU level of care, please re-engage if her clinical status worsens   Acute renal failure Hypokalemia Creatinine stable at 1.15, ~300cc UOP -ensure renal perfusion -trend and replete electrolytes prn   Thrombocytopenia Acute, platelets 50k today Likely secondary to sepsis -check repeat CBC this afternoon to ensure does not need transfusion before surgery tomorrow   Best Practice (right click and "Reselect all SmartList Selections" daily)   Per primary  Labs   CBC: Recent Labs  Lab 03/27/22 1945 03/28/22 1129 03/28/22 1922 03/29/22 0325 03/30/22 0500  WBC 5.2 9.7 14.1* 12.8* 12.9*  HGB 10.7* 10.9* 11.4* 9.3* 8.7*  HCT 31.6* 32.7* 32.9* 27.7* 25.6*  MCV 89.5 93.4 90.6 91.7 89.8  PLT 218  118* 61* 54* 50*     Basic Metabolic Panel: Recent Labs  Lab 03/27/22 1945 03/28/22 1129 03/29/22 0325 03/30/22 0500  NA 131* 133* 134* 136  K 3.4* 2.9* 4.0 3.4*  CL 97* 98 104 111  CO2 22 20* 18* 16*  GLUCOSE 156* 197* 140* 116*  BUN 17 25* 33* 27*  CREATININE 0.52 1.00 1.15* 1.15*  CALCIUM 9.1 8.3* 7.9* 7.0*  MG  --  1.9  --  1.8  PHOS  --  3.8  --   --     GFR: CrCl cannot be calculated (Unknown ideal weight.). Recent Labs  Lab 03/27/22 1945 03/27/22 2140 03/28/22 1129 03/28/22 1922 03/28/22 2050 03/29/22 0325 03/30/22 0500  WBC 5.2  --  9.7 14.1*  --  12.8* 12.9*  LATICACIDVEN 2.1* 1.4  --  2.6* 2.3*  --   --      Liver Function Tests: Recent Labs  Lab 03/27/22 1945 03/28/22 1129 03/29/22 0325 03/30/22 0500  AST 1,803* 1,332* 649* 258*  ALT 546* 752* 582* 350*  ALKPHOS 194* 176* 114 100  BILITOT 1.3* 3.4* 4.4* 2.8*  PROT 6.9 5.7* 6.0* 4.5*  ALBUMIN 3.6 3.1* 3.7 2.8*    Recent Labs  Lab 03/27/22 1945 03/28/22 1129  LIPASE 61* 93*    Recent Labs  Lab 03/28/22 1129  AMMONIA 26     ABG No results found for: "PHART", "PCO2ART", "PO2ART", "HCO3", "TCO2", "ACIDBASEDEF", "O2SAT"   Coagulation Profile: No results for input(s): "INR", "PROTIME" in the last 168 hours.  Cardiac Enzymes: No results for input(s): "CKTOTAL", "CKMB", "CKMBINDEX", "TROPONINI" in the last 168 hours.  HbA1C: Hgb A1c MFr Bld  Date/Time Value Ref Range Status  02/15/2020 08:27 AM 6.0 (H) 4.8 - 5.6 % Final    Comment:    (NOTE) Pre diabetes:          5.7%-6.4%  Diabetes:              >6.4%  Glycemic control for   <7.0% adults with diabetes     CBG: Recent Labs  Lab 03/28/22 2330 03/29/22 0549 03/29/22 1301 03/30/22 0001 03/30/22 0648  GLUCAP 140* 130* 101* 107* 112*     Review of Systems:   No chest pain.  No orthopnea or PND.  Comprehensive review of systems otherwise negative.  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   Surgical History:   Past Surgical History:  Procedure Laterality Date   COLONOSCOPY     PARTIAL KNEE ARTHROPLASTY Left 02/25/2020   Procedure: UNICOMPARTMENTAL KNEE;  Surgeon: Gaynelle Arabian, MD;  Location: [facility];  Service: Orthopedics;  Laterality: Left;  44mn     Social History:   reports that she has never smoked. She has never used smokeless tobacco. She reports that she does not drink alcohol and does not use drugs.   Family History:  Her family history is negative for Colon cancer.   Allergies Allergies  Allergen Reactions   Pyridium [Phenazopyridine Hcl] Other (See Comments)    Hemolytic anemia  Sulfa Antibiotics     Potential allergy: oxidizing drug: methemoglobinemia/hemolysis on pyridium   Ciprofloxacin Hcl     Sx's almost like a heart attack   Esomeprazole Magnesium Nausea Only    Pain and nausea   Macrodantin [Nitrofurantoin Macrocrystal] Other (See Comments)    GI Upset   Pantoprazole Sodium Nausea Only    abd pain and nausea   Tramadol Nausea Only     Home Medications  Prior to Admission medications   Medication Sig Start Date End Date Taking? Authorizing Provider  ascorbic acid (VITAMIN C) 500 MG tablet Take 500 mg by mouth daily.    [provider]  cefdinir (OMNICEF) 300 MG capsule Take 1 capsule (300 mg total) by mouth 2 (two) times daily. 11/21/20   Quintella Reichert, MD  Cholecalciferol (VITAMIN D3) 2000 UNITS TABS Take 1 tablet by mouth daily.    [provider]  COVID-19  mRNA Vac-TriS, Pfizer, SUSP injection Inject into the muscle. 09/23/20   Carlyle Basques, MD  glucosamine-chondroitin 500-400 MG tablet Take 1 tablet by mouth 3 (three) times daily.    [provider]  glucose blood (ACCU-CHEK AVIVA PLUS) test strip Use to check blood sugar 3 times daily. 01/20/22     levothyroxine (SYNTHROID, LEVOTHROID) 75 MCG tablet Take 75 mcg by mouth daily.    [provider]  losartan (COZAAR) 100 MG tablet Take 100 mg by mouth daily.    [provider]  methocarbamol (ROBAXIN) 500 MG tablet Take 1 tablet (500 mg total) by mouth every 6 (six) hours as needed for muscle spasms. 02/26/20   Edmisten, Ok Anis, PA  Multiple Vitamin (MULTIVITAMIN) tablet Take 1 tablet by mouth daily.    [provider]  Omega-3 Fatty Acids (FISH OIL) 500 MG CAPS Take 500 mg by mouth 2 (two) times daily.    [provider]  omeprazole (PRILOSEC) 40 MG capsule Take 1 capsule (40 mg total) by mouth daily. 03/12/15   Pyrtle, Lajuan Lines, MD  ondansetron (ZOFRAN-ODT) 4 MG disintegrating tablet Take 1 tablet (4 mg total) by mouth every 8 (eight) hours as needed for nausea or vomiting. 09/15/21   White, Leitha Schuller, NP  oxyCODONE (OXY IR/ROXICODONE) 5 MG immediate release tablet Take 1-2 tablets (5-10 mg total) by mouth every 6 (six) hours as needed for severe pain. 02/26/20   Edmisten, Kristie L, PA  pravastatin (PRAVACHOL) 40 MG tablet Take 40 mg by mouth daily. 01/12/20   [provider]  sitaGLIPtan-metformin (JANUMET) 50-500 MG per tablet Take 1 tablet by mouth 2 (two) times daily with a meal.    [provider]  vitamin B-12 (CYANOCOBALAMIN) 100 MCG tablet Take 100 mcg by mouth daily.    [provider]     Critical care time:  n/a       Otilio Carpen Ralyn Stlaurent, PA-C [facility] See Amion for pager If no response to pager , please call [phone] until 7pm After 7:00 pm call Elink  [phone]

## 2022-03-30 NOTE — Progress Notes (Signed)
eLink Physician-Brief Progress Note Patient Name: DAUNA ZISKA DOB: 08/04/1940 MRN: [id number]   Date of Service  03/30/2022  HPI/Events of Note  pt had increased work of breathing, complaining of shortness of breath, anxious.  Nurse gave fentynl, now pt is calm, but this was a change in her status  Camera:  Discussed with RN. [place] speaking. Looks more like anxiety. Now calm and resting post fentanyl for pain. HR 88, sats 100% on 2 lit o2. MAP good. BM +.   eICU Interventions  Watch for now, if increasing HR, or hypoxemia to call bed side team.      Intervention Category Intermediate Interventions: Pain - evaluation and management  Elmer Sow 03/30/2022, 6:51 AM

## 2022-03-31 ENCOUNTER — Inpatient Hospital Stay (HOSPITAL_COMMUNITY): Payer: Medicare Other | Admitting: Anesthesiology

## 2022-03-31 ENCOUNTER — Encounter (HOSPITAL_COMMUNITY)
Admission: EM | Disposition: A | Discharge status: Home Health | Payer: Self-pay | Source: Home / Self Care | Attending: Internal Medicine

## 2022-03-31 ENCOUNTER — Encounter (HOSPITAL_COMMUNITY): Payer: Self-pay | Admitting: Internal Medicine

## 2022-03-31 ENCOUNTER — Other Ambulatory Visit: Payer: Self-pay

## 2022-03-31 DIAGNOSIS — E872 Acidosis, unspecified: Secondary | ICD-10-CM | POA: Diagnosis not present

## 2022-03-31 DIAGNOSIS — R7401 Elevation of levels of liver transaminase levels: Secondary | ICD-10-CM

## 2022-03-31 DIAGNOSIS — K449 Diaphragmatic hernia without obstruction or gangrene: Secondary | ICD-10-CM

## 2022-03-31 DIAGNOSIS — K8309 Other cholangitis: Secondary | ICD-10-CM | POA: Diagnosis not present

## 2022-03-31 DIAGNOSIS — I10 Essential (primary) hypertension: Secondary | ICD-10-CM

## 2022-03-31 DIAGNOSIS — R6521 Severe sepsis with septic shock: Secondary | ICD-10-CM

## 2022-03-31 DIAGNOSIS — K810 Acute cholecystitis: Secondary | ICD-10-CM

## 2022-03-31 DIAGNOSIS — E039 Hypothyroidism, unspecified: Secondary | ICD-10-CM

## 2022-03-31 DIAGNOSIS — E119 Type 2 diabetes mellitus without complications: Secondary | ICD-10-CM

## 2022-03-31 DIAGNOSIS — E876 Hypokalemia: Secondary | ICD-10-CM | POA: Diagnosis not present

## 2022-03-31 DIAGNOSIS — D638 Anemia in other chronic diseases classified elsewhere: Secondary | ICD-10-CM

## 2022-03-31 DIAGNOSIS — N390 Urinary tract infection, site not specified: Secondary | ICD-10-CM

## 2022-03-31 DIAGNOSIS — A419 Sepsis, unspecified organism: Principal | ICD-10-CM

## 2022-03-31 DIAGNOSIS — D696 Thrombocytopenia, unspecified: Secondary | ICD-10-CM

## 2022-03-31 DIAGNOSIS — K819 Cholecystitis, unspecified: Secondary | ICD-10-CM

## 2022-03-31 HISTORY — PX: CHOLECYSTECTOMY: SHX55

## 2022-03-31 LAB — COMPREHENSIVE METABOLIC PANEL
ALT: 267 U/L — ABNORMAL HIGH (ref 0–44)
AST: 126 U/L — ABNORMAL HIGH (ref 15–41)
Albumin: 2.4 g/dL — ABNORMAL LOW (ref 3.5–5.0)
Alkaline Phosphatase: 109 U/L (ref 38–126)
Anion gap: 9 (ref 5–15)
BUN: 20 mg/dL (ref 8–23)
CO2: 16 mmol/L — ABNORMAL LOW (ref 22–32)
Calcium: 7.2 mg/dL — ABNORMAL LOW (ref 8.9–10.3)
Chloride: 113 mmol/L — ABNORMAL HIGH (ref 98–111)
Creatinine, Ser: 1.01 mg/dL — ABNORMAL HIGH (ref 0.44–1.00)
GFR, Estimated: 55 mL/min — ABNORMAL LOW (ref >60)
Glucose, Bld: 98 mg/dL (ref 70–99)
Potassium: 3.9 mmol/L (ref 3.5–5.1)
Sodium: 138 mmol/L (ref 135–145)
Total Bilirubin: 2.2 mg/dL — ABNORMAL HIGH (ref 0.3–1.2)
Total Protein: 4.7 g/dL — ABNORMAL LOW (ref 6.5–8.1)

## 2022-03-31 LAB — CBC WITH DIFFERENTIAL/PLATELET
Abs Immature Granulocytes: 0.22 10*3/uL — ABNORMAL HIGH (ref 0.00–0.07)
Basophils Absolute: 0.1 10*3/uL (ref 0.0–0.1)
Basophils Relative: 0 %
Eosinophils Absolute: 0.1 10*3/uL (ref 0.0–0.5)
Eosinophils Relative: 1 %
HCT: 24.3 % — ABNORMAL LOW (ref 36.0–46.0)
Hemoglobin: 8.3 g/dL — ABNORMAL LOW (ref 12.0–15.0)
Immature Granulocytes: 2 %
Lymphocytes Relative: 6 %
Lymphs Abs: 0.9 10*3/uL (ref 0.7–4.0)
MCH: 31.2 pg (ref 26.0–34.0)
MCHC: 34.2 g/dL (ref 30.0–36.0)
MCV: 91.4 fL (ref 80.0–100.0)
Monocytes Absolute: 0.6 10*3/uL (ref 0.1–1.0)
Monocytes Relative: 4 %
Neutro Abs: 13.3 10*3/uL — ABNORMAL HIGH (ref 1.7–7.7)
Neutrophils Relative %: 87 %
Platelets: 59 10*3/uL — ABNORMAL LOW (ref 150–400)
RBC: 2.66 MIL/uL — ABNORMAL LOW (ref 3.87–5.11)
RDW: 16.2 % — ABNORMAL HIGH (ref 11.5–15.5)
WBC: 15.2 10*3/uL — ABNORMAL HIGH (ref 4.0–10.5)
nRBC: 0 % (ref 0.0–0.2)

## 2022-03-31 LAB — GLUCOSE, CAPILLARY
Glucose-Capillary: 105 mg/dL — ABNORMAL HIGH (ref 70–99)
Glucose-Capillary: 110 mg/dL — ABNORMAL HIGH (ref 70–99)
Glucose-Capillary: 124 mg/dL — ABNORMAL HIGH (ref 70–99)
Glucose-Capillary: 126 mg/dL — ABNORMAL HIGH (ref 70–99)
Glucose-Capillary: 93 mg/dL (ref 70–99)

## 2022-03-31 LAB — TYPE AND SCREEN
ABO/RH(D): B POS
Antibody Screen: NEGATIVE

## 2022-03-31 LAB — MAGNESIUM: Magnesium: 2.4 mg/dL (ref 1.7–2.4)

## 2022-03-31 SURGERY — LAPAROSCOPIC CHOLECYSTECTOMY
Anesthesia: General

## 2022-03-31 MED ORDER — DEXAMETHASONE SODIUM PHOSPHATE 10 MG/ML IJ SOLN
INTRAMUSCULAR | Status: AC
  Filled 2022-03-31: qty 1

## 2022-03-31 MED ORDER — FENTANYL CITRATE (PF) 100 MCG/2ML IJ SOLN
INTRAMUSCULAR | Status: DC | PRN
  Administered 2022-03-31 (×2): 50 ug via INTRAVENOUS

## 2022-03-31 MED ORDER — CHLORHEXIDINE GLUCONATE 0.12 % MT SOLN
15.0000 mL | Freq: Once | OROMUCOSAL | Status: AC
  Administered 2022-03-31: 15 mL via OROMUCOSAL

## 2022-03-31 MED ORDER — OLANZAPINE 5 MG PO TBDP: 5.0000 mg | ORAL_TABLET | Freq: Every evening | ORAL | Status: DC | PRN

## 2022-03-31 MED ORDER — ONDANSETRON HCL 4 MG/2ML IJ SOLN
INTRAMUSCULAR | Status: DC | PRN
  Administered 2022-03-31: 4 mg via INTRAVENOUS

## 2022-03-31 MED ORDER — FENTANYL CITRATE PF 50 MCG/ML IJ SOSY: 25.0000 ug | PREFILLED_SYRINGE | INTRAMUSCULAR | Status: DC | PRN

## 2022-03-31 MED ORDER — LIDOCAINE 2% (20 MG/ML) 5 ML SYRINGE
INTRAMUSCULAR | Status: DC | PRN
  Administered 2022-03-31: 60 mg via INTRAVENOUS

## 2022-03-31 MED ORDER — INSULIN ASPART 100 UNIT/ML IJ SOLN
0.0000 [IU] | Freq: Three times a day (TID) | INTRAMUSCULAR | Status: DC
  Administered 2022-04-01 (×2): 2 [IU] via SUBCUTANEOUS
  Administered 2022-04-01 – 2022-04-03 (×5): 1 [IU] via SUBCUTANEOUS
  Administered 2022-04-03: 2 [IU] via SUBCUTANEOUS
  Administered 2022-04-03: 1 [IU] via SUBCUTANEOUS
  Administered 2022-04-04 (×2): 2 [IU] via SUBCUTANEOUS
  Administered 2022-04-04 – 2022-04-06 (×5): 1 [IU] via SUBCUTANEOUS
  Administered 2022-04-06: 2 [IU] via SUBCUTANEOUS
  Administered 2022-04-07: 5 [IU] via SUBCUTANEOUS
  Administered 2022-04-07 – 2022-04-08 (×2): 1 [IU] via SUBCUTANEOUS
  Administered 2022-04-08: 5 [IU] via SUBCUTANEOUS
  Administered 2022-04-08: 3 [IU] via SUBCUTANEOUS
  Administered 2022-04-09: 2 [IU] via SUBCUTANEOUS
  Administered 2022-04-09: 1 [IU] via SUBCUTANEOUS

## 2022-03-31 MED ORDER — STERILE WATER FOR INJECTION IV SOLN
INTRAVENOUS | Status: DC
  Filled 2022-03-31 (×4): qty 150

## 2022-03-31 MED ORDER — LACTATED RINGERS IR SOLN
Status: DC | PRN
  Administered 2022-03-31: 1000 mL

## 2022-03-31 MED ORDER — LACTATED RINGERS IV SOLN: INTRAVENOUS | Status: DC

## 2022-03-31 MED ORDER — LABETALOL HCL 5 MG/ML IV SOLN
INTRAVENOUS | Status: AC
  Filled 2022-03-31: qty 4

## 2022-03-31 MED ORDER — ONDANSETRON HCL 4 MG/2ML IJ SOLN
INTRAMUSCULAR | Status: AC
  Filled 2022-03-31: qty 2

## 2022-03-31 MED ORDER — ROCURONIUM BROMIDE 10 MG/ML (PF) SYRINGE
PREFILLED_SYRINGE | INTRAVENOUS | Status: DC | PRN
  Administered 2022-03-31: 60 mg via INTRAVENOUS

## 2022-03-31 MED ORDER — ROCURONIUM BROMIDE 10 MG/ML (PF) SYRINGE
PREFILLED_SYRINGE | INTRAVENOUS | Status: AC
  Filled 2022-03-31: qty 10

## 2022-03-31 MED ORDER — ACETAMINOPHEN 10 MG/ML IV SOLN: 1000.0000 mg | Freq: Once | INTRAVENOUS | Status: DC | PRN

## 2022-03-31 MED ORDER — OXYCODONE HCL 5 MG PO TABS: 5.0000 mg | ORAL_TABLET | Freq: Once | ORAL | Status: DC | PRN

## 2022-03-31 MED ORDER — PROPOFOL 10 MG/ML IV BOLUS
INTRAVENOUS | Status: DC | PRN
  Administered 2022-03-31: 80 mg via INTRAVENOUS

## 2022-03-31 MED ORDER — ONDANSETRON HCL 4 MG/2ML IJ SOLN: 4.0000 mg | Freq: Once | INTRAMUSCULAR | Status: DC | PRN

## 2022-03-31 MED ORDER — LACTATED RINGERS IV SOLN: INTRAVENOUS | Status: DC | PRN

## 2022-03-31 MED ORDER — ORAL CARE MOUTH RINSE: 15.0000 mL | Freq: Once | OROMUCOSAL | Status: AC

## 2022-03-31 MED ORDER — SUGAMMADEX SODIUM 200 MG/2ML IV SOLN
INTRAVENOUS | Status: DC | PRN
  Administered 2022-03-31: 100 mg via INTRAVENOUS

## 2022-03-31 MED ORDER — 0.9 % SODIUM CHLORIDE (POUR BTL) OPTIME
TOPICAL | Status: DC | PRN
  Administered 2022-03-31: 1000 mL

## 2022-03-31 MED ORDER — LIDOCAINE HCL (PF) 2 % IJ SOLN
INTRAMUSCULAR | Status: AC
  Filled 2022-03-31: qty 5

## 2022-03-31 MED ORDER — DEXAMETHASONE SODIUM PHOSPHATE 10 MG/ML IJ SOLN
INTRAMUSCULAR | Status: DC | PRN
  Administered 2022-03-31: 10 mg via INTRAVENOUS

## 2022-03-31 MED ORDER — PROPOFOL 10 MG/ML IV BOLUS
INTRAVENOUS | Status: AC
  Filled 2022-03-31: qty 20

## 2022-03-31 MED ORDER — FENTANYL CITRATE (PF) 100 MCG/2ML IJ SOLN
INTRAMUSCULAR | Status: AC
  Filled 2022-03-31: qty 2

## 2022-03-31 MED ORDER — LABETALOL HCL 5 MG/ML IV SOLN
INTRAVENOUS | Status: DC | PRN
  Administered 2022-03-31: 5 mg via INTRAVENOUS

## 2022-03-31 MED ORDER — BUPIVACAINE-EPINEPHRINE 0.5% -1:200000 IJ SOLN
INTRAMUSCULAR | Status: DC | PRN
  Administered 2022-03-31: 30 mL

## 2022-03-31 MED ORDER — OLANZAPINE 5 MG PO TBDP: 5.0000 mg | ORAL_TABLET | Freq: Every day | ORAL | Status: DC | PRN

## 2022-03-31 MED ORDER — OXYCODONE HCL 5 MG/5ML PO SOLN: 5.0000 mg | Freq: Once | ORAL | Status: DC | PRN

## 2022-03-31 SURGICAL SUPPLY — 42 items
ADH SKN CLS APL DERMABOND .7 (GAUZE/BANDAGES/DRESSINGS) ×1
APL PRP STRL LF DISP 70% ISPRP (MISCELLANEOUS) ×1
APPLIER CLIP 5 13 M/L LIGAMAX5 (MISCELLANEOUS)
APR CLP MED LRG 5 ANG JAW (MISCELLANEOUS)
BAG COUNTER SPONGE SURGICOUNT (BAG) IMPLANT
BAG SPEC RTRVL 10 TROC 200 (ENDOMECHANICALS)
BAG SPNG CNTER NS LX DISP (BAG)
CABLE HIGH FREQUENCY MONO STRZ (ELECTRODE) ×1 IMPLANT
CHLORAPREP W/TINT 26 (MISCELLANEOUS) ×1 IMPLANT
CLIP APPLIE 5 13 M/L LIGAMAX5 (MISCELLANEOUS) IMPLANT
CLIP LIGATING HEMO O LOK GREEN (MISCELLANEOUS) ×1 IMPLANT
COVER MAYO STAND XLG (MISCELLANEOUS) ×1 IMPLANT
COVER TRANSDUCER ULTRASND (DRAPES) IMPLANT
DERMABOND ADVANCED .7 DNX12 (GAUZE/BANDAGES/DRESSINGS) ×1 IMPLANT
DRAPE C-ARM 42X120 X-RAY (DRAPES) ×1 IMPLANT
ELECT REM PT RETURN 15FT ADLT (MISCELLANEOUS) ×1 IMPLANT
ENDOLOOP SUT PDS II  0 18 (SUTURE) ×2
ENDOLOOP SUT PDS II 0 18 (SUTURE) IMPLANT
GAUZE SPONGE 2X2 STRL 8-PLY (GAUZE/BANDAGES/DRESSINGS) ×1 IMPLANT
GLOVE BIO SURGEON STRL SZ7.5 (GLOVE) ×1 IMPLANT
GOWN STRL REUS W/ TWL XL LVL3 (GOWN DISPOSABLE) ×3 IMPLANT
GOWN STRL REUS W/TWL XL LVL3 (GOWN DISPOSABLE) ×3
GRASPER SUT TROCAR 14GX15 (MISCELLANEOUS) IMPLANT
IRRIG SUCT STRYKERFLOW 2 WTIP (MISCELLANEOUS) ×1
IRRIGATION SUCT STRKRFLW 2 WTP (MISCELLANEOUS) ×1 IMPLANT
KIT BASIN OR (CUSTOM PROCEDURE TRAY) ×1 IMPLANT
KIT TURNOVER KIT A (KITS) IMPLANT
NDL INSUFFLATION 14GA 120MM (NEEDLE) ×1 IMPLANT
NEEDLE INSUFFLATION 14GA 120MM (NEEDLE) ×1 IMPLANT
PENCIL SMOKE EVACUATOR (MISCELLANEOUS) IMPLANT
POUCH RETRIEVAL ECOSAC 10 (ENDOMECHANICALS) IMPLANT
POUCH RETRIEVAL ECOSAC 10MM (ENDOMECHANICALS)
SCISSORS LAP 5X35 DISP (ENDOMECHANICALS) ×1 IMPLANT
SET CHOLANGIOGRAPH MIX (MISCELLANEOUS) ×1 IMPLANT
SET TUBE SMOKE EVAC HIGH FLOW (TUBING) ×1 IMPLANT
SLEEVE Z-THREAD 5X100MM (TROCAR) ×1 IMPLANT
SPIKE FLUID TRANSFER (MISCELLANEOUS) ×1 IMPLANT
SUT MNCRL AB 4-0 PS2 18 (SUTURE) ×1 IMPLANT
TOWEL OR 17X26 10 PK STRL BLUE (TOWEL DISPOSABLE) ×1 IMPLANT
TRAY LAPAROSCOPIC (CUSTOM PROCEDURE TRAY) ×1 IMPLANT
TROCAR 11X100 Z THREAD (TROCAR) ×1 IMPLANT
TROCAR Z-THREAD OPTICAL 5X100M (TROCAR) ×1 IMPLANT

## 2022-03-31 NOTE — Progress Notes (Signed)
2 Days Post-Op   Subjective/Chief Complaint: PT doing well this AM Min abd pain   Objective: Vital signs in last 24 hours: Temp:  [97.6 F (36.4 C)-98.1 F (36.7 C)] 98.1 F (36.7 C) (01/17 0626) Pulse Rate:  [68-114] 93 (01/17 0626) Resp:  [13-27] 18 (01/17 0626) BP: (103-171)/(51-98) 145/77 (01/17 0626) SpO2:  [88 %-100 %] 94 % (01/17 0626) Arterial Line BP: (121-147)/(24-64) 128/46 (01/16 1500) Last BM Date : 03/27/22  Intake/Output from previous day: 01/16 0701 - 01/17 0700 In: 1275 [I.V.:1024.3; IV Piggyback:250.7] Out: 900 [Urine:900] Intake/Output this shift: Total I/O In: 716.2 [I.V.:716.2] Out: 1100 [Urine:1100]  PE:  Constitutional: No acute distress, conversant, appears states age. Eyes: Anicteric sclerae, moist conjunctiva, no lid lag Lungs: Clear to auscultation bilaterally, normal respiratory effort CV: regular rate and rhythm, no murmurs, no peripheral edema, pedal pulses 2+ GI: Soft, no masses or hepatosplenomegaly, non-tender to palpation Skin: No rashes, palpation reveals normal turgor Psychiatric: appropriate judgment and insight, oriented to person, place, and time   Lab Results:  Recent Labs    03/30/22 1534 03/31/22 0500  WBC 15.3* 15.2*  HGB 9.0* 8.3*  HCT 25.9* 24.3*  PLT 57* 59*   BMET Recent Labs    03/30/22 0500 03/31/22 0500  NA 136 138  K 3.4* 3.9  CL 111 113*  CO2 16* 16*  GLUCOSE 116* 98  BUN 27* 20  CREATININE 1.15* 1.01*  CALCIUM 7.0* 7.2*   PT/INR No results for input(s): "LABPROT", "INR" in the last 72 hours. ABG No results for input(s): "PHART", "HCO3" in the last 72 hours.  Invalid input(s): "PCO2", "PO2"  Studies/Results: DG ERCP  Result Date: 03/29/2022 CLINICAL DATA:  ERCP EXAM: ERCP TECHNIQUE: Multiple spot images obtained with the fluoroscopic device and submitted for interpretation post-procedure. FLUOROSCOPY TIME: FLUOROSCOPY TIME 1 minute, 10 seconds (30.7 mGy) COMPARISON:  MRCP-03/28/2022 FINDINGS:  Six spot intraoperative fluoroscopic images of the right upper abdominal quadrant during ERCP are provided for review. Initial image demonstrates an ERCP probe overlying the right upper abdominal quadrant. Subsequent images demonstrate selective cannulation and opacification of the common bile duct which appears mild to moderately dilated and potentially slightly irregular though this is potentially artifactual due to opacification. Subsequent images demonstrate insufflation of a balloon within the central aspect of the CBD with subsequent biliary sweeping and presumed sphincterotomy. There is minimal opacification of the intrahepatic biliary tree which appears mildly dilated. There is no definitive opacification of either the cystic or pancreatic ducts. IMPRESSION: ERCP with biliary sweeping and presumed sphincterotomy as above. These images were submitted for radiologic interpretation only. Please see the procedural report for the amount of contrast and the fluoroscopy time utilized. Electronically Signed   By: Sandi Mariscal M.D.   On: 03/29/2022 14:13    Anti-infectives: Anti-infectives (From admission, onward)    Start     Dose/Rate Route Frequency Ordered Stop   03/29/22 2100  piperacillin-tazobactam (ZOSYN) IVPB 3.375 g  Status:  Discontinued        3.375 g 12.5 mL/hr over 240 Minutes Intravenous Every 8 hours 03/28/22 2051 03/29/22 0947   03/29/22 1000  piperacillin-tazobactam (ZOSYN) IVPB 3.375 g        3.375 g 12.5 mL/hr over 240 Minutes Intravenous Every 8 hours 03/29/22 0947     03/29/22 0800  cefTRIAXone (ROCEPHIN) 2 g in sodium chloride 0.9 % 100 mL IVPB  Status:  Discontinued        2 g 200 mL/hr over 30 Minutes Intravenous Every  24 hours 03/28/22 1058 03/28/22 1212   03/29/22 0800  cefTRIAXone (ROCEPHIN) 1 g in sodium chloride 0.9 % 100 mL IVPB  Status:  Discontinued        1 g 200 mL/hr over 30 Minutes Intravenous Every 24 hours 03/28/22 1212 03/28/22 1603   03/28/22 1700   piperacillin-tazobactam (ZOSYN) IVPB 3.375 g  Status:  Discontinued        3.375 g 12.5 mL/hr over 240 Minutes Intravenous Every 8 hours 03/28/22 1603 03/28/22 2051   03/28/22 1115  metroNIDAZOLE (FLAGYL) IVPB 500 mg  Status:  Discontinued        500 mg 100 mL/hr over 60 Minutes Intravenous 2 times daily 03/28/22 1058 03/28/22 1609   03/28/22 0645  cefTRIAXone (ROCEPHIN) 1 g in sodium chloride 0.9 % 100 mL IVPB        1 g 200 mL/hr over 30 Minutes Intravenous  Once 03/28/22 0644 03/28/22 0801       Assessment/Plan: 9F with cholangitis and choledocholithiasis -plan for OR today for lap chole All risks and benefits were discussed with the patient and her son to generally include: infection, bleeding, possible need for post op ERCP, damage to the bile ducts, and bile leak. Alternatives were offered and described.  All questions were answered and the patient voiced understanding of the procedure and wishes to proceed at this point with a laparoscopic cholecystectomy    LOS: 3 days    Lauren [name] 03/31/2022

## 2022-03-31 NOTE — Anesthesia Procedure Notes (Signed)
Procedure Name: Intubation Date/Time: 03/31/2022 1:54 PM  Performed by: Dennice Tindol D, CRNAPre-anesthesia Checklist: Patient identified, Emergency Drugs available, Suction available and Patient being monitored Patient Re-evaluated:Patient Re-evaluated prior to induction Oxygen Delivery Method: Circle system utilized Preoxygenation: Pre-oxygenation with 100% oxygen Induction Type: IV induction Ventilation: Mask ventilation without difficulty Laryngoscope Size: Mac and 3 Grade View: Grade I Tube type: Oral Tube size: 7.0 mm Number of attempts: 1 Airway Equipment and Method: Stylet and Oral airway Placement Confirmation: ETT inserted through vocal cords under direct vision, positive ETCO2 and breath sounds checked- equal and bilateral Secured at: 21 cm Tube secured with: Tape Dental Injury: Teeth and Oropharynx as per pre-operative assessment

## 2022-03-31 NOTE — Care Management Important Message (Signed)
Important Message  Patient Details IM Letter given. Name: Lauren [name] MRN: [id number] Date of Birth: 02/22/1941   Medicare Important Message Given:  Yes     Kerin Salen 03/31/2022, 10:58 AM

## 2022-03-31 NOTE — Progress Notes (Signed)
PROGRESS NOTE    Lauren [name]  IOX:[id number] DOB: 05/23/1940 DOA: 03/27/2022 PCP: Jani Gravel, MD    Chief Complaint  Patient presents with   Abdominal Pain    Brief Narrative:  82 year old woman admitted to hospital with abdominal pain, several days of poor p.o. intake found to have cholangitis versus cholecystitis.   History largely per son at bedside.  Several days of poor appetite.  Not eating very much.  Abdominal pain worsened after eating bacon.  Presented to the ED.  They are ultrasound with dilated bile duct, sludge, gallbladder thickening.  MRCP subsequently obtained.  This demonstrated concern for cholangitis versus cholecystitis.  Surgery and GI were consulted.  Felt cholecystitis was less likely, most likely cholangitis.  Plan for ERCP 1/15 which was concerning for ascending cholangitis and acute cholecystitis.  Patient noted on the afternoon of 03/28/2022 to be, more hypotensive.  Not responding to fluids.  Transferred to the stepdown unit.  Patient required pressors.  Labs notable for creatinine doubled.  LFTs essentially stable with rising T. bili.  General surgery assessed patient as well.  Patient weaned off pressors transferred to the floor and transferred to [facility] service.  Patient for laparoscopic cholecystectomy 03/31/2022.   Assessment & Plan:   Principal Problem:   Ascending cholangitis Active Problems:   HTN (hypertension)   Hyperlipidemia   Type 2 diabetes mellitus (HCC)   GERD (gastroesophageal reflux disease)   Hypothyroidism   Nausea without vomiting   Chronic kidney disease, stage 2 (mild)   Iron deficiency anemia   Recurrent urinary tract infection   Vertigo   Hepatitis   Hypokalemia   LFT elevation   Shock (HCC)   Metabolic acidosis   Acute cholecystitis   Transaminitis   Anemia of chronic disease   Thrombocytopenia (HCC)   Septic shock (HCC)   Urinary tract infection without hematuria  #1 septic shock secondary to acute cholangitis and  probable acute cholecystitis,/transaminitis.  POA -Patient noted during the hospitalization to be persistently hypotensive despite IV fluid resuscitation requiring pressors. -CT angiogram abdomen and pelvis done with no evidence of aortic aneurysm or dissection.  Aortic atherosclerosis.  Old granulomatous disease.  Hepatic steatosis. -Right upper quadrant ultrasound done with small amount of gallbladder sludge however no definite gallstones identified.  Mild diffuse gallbladder gallbladder wall thickening which is nonspecific.  Mild dilatation of CBD measuring 10 mm containing sludge. -Workup done including MRCP with mild gallbladder wall thickening, pericholecystic edema highly suspicious for acute cholecystitis.  Mild dilatation of central intrahepatic bile ducts and CBD to the level of the ampulla.  T2 hypointense/seen throughout the common bile duct without definite calculi.  Cholangitis cannot be excluded.  Small several subscapular wedge-shaped signal abnormalities and there are peripheral right and left hepatic lobes noted nonspecific.  Mild hepatic steatosis.  Mild perihepatic ascites. -ERCP done by GI concerning for ascending cholangitis based upon dark bile and purulent debris.  Concern for cholecystitis as well. -Patient slowly improving clinically, currently on IV Zosyn. -Patient seen by general surgery and patient for laparoscopic cholecystectomy today. -IV fluids, pain management.  2.  Metabolic acidosis -Likely second to acute infection. -Continue empiric IV antibiotics. -Change IV fluids to bicarb drip.  3.  Anemia of chronic disease -Hemoglobin currently at 8.3, likely dilutional, patient with no overt bleeding. -Anemia panel consistent with anemia of chronic disease. -Follow H&H. -Transfusing transfused hemoglobin < 7.  4.  Acute renal failure -Likely secondary to a prerenal azotemia and in the setting of ARB. -Urine  output of 900 cc over the past 24 hours.  5.   Hypokalemia -Repleted. -Magnesium at 2.4.  6.  Thrombocytopenia -Likely secondary to acute infection. -Patient with no overt bleeding. -Platelet count stable. -Follow. -Renal liver function improving with hydration.  7.  E. coli UTI -On IV Zosyn.  8.  Hypothyroidism -Continue home dose Synthroid.  9.  Hypertension -Hold home antihypertensive medications as patient had presented with septic shock.  Follow BP.  10.  Diabetes mellitus type 2 -Last hemoglobin A1c 6.0 on 02/15/2020. -Repeat hemoglobin A1c. -Hold home oral hypoglycemic agents. -SSI.    DVT prophylaxis: SCDs.  Patient for surgery today.  Postop DVT prophylaxis per general surgery. Code Status: Full Family Communication: Updated patient and son at bedside. Disposition: Likely home with home health once medically improved and stable.  And cleared by GI and general surgery.  Status is: Inpatient Remains inpatient appropriate because: Severity of illness.   Consultants:  Gastroenterology: Dr. Benson Norway 03/28/2022 General surgery: Dr. Johney Maine 03/28/2022 PCCM: Dr. Silas Flood 03/28/2022  Procedures:  ERCP with sphincterotomy/papillotomy per GI, Dr. Carlean Purl 03/29/2022 CT angiogram chest abdomen and pelvis 03/27/2022 MRCP 03/28/2022 Right upper quadrant ultrasound 03/28/2022  Significant Hospital Events: Including procedures, antibiotic start and stop dates in addition to other pertinent events   03/28/2021 admitted to the hospital after a week of poor p.o. intake, with abdominal pain, likely cholangitis versus cholecystitis, on levophed 1/16 Off levophed,  Antimicrobials:  IV Rocephin 1/14/ 2024 x 1 dose IV Zosyn 03/28/2022>>>>>>>   Subjective: Patient laying in bed.  IV team at bedside and drawing blood.  Patient denies any chest pain, no shortness of breath.  States abdominal pain improving.  Alert and oriented.  Events overnight of agitation noted.  Patient back at baseline breasts per son who is at  bedside.  Objective: Vitals:   03/31/22 0330 03/31/22 0345 03/31/22 0400 03/31/22 0626  BP: (!) 103/51 (!) 132/91 (!) 171/98 (!) 145/77  Pulse: 86 95 (!) 109 93  Resp: 19 (!) 23 (!) 21 18  Temp:    98.1 F (36.7 C)  TempSrc:    Axillary  SpO2: 93% 94% 90% 94%    Intake/Output Summary (Last 24 hours) at 03/31/2022 1138 Last data filed at 03/31/2022 0705 Gross per 24 hour  Intake 1056.23 ml  Output 2000 ml  Net -943.77 ml   There were no vitals filed for this visit.  Examination:  General exam: Appears calm and comfortable  Respiratory system: Clear to auscultation.  No wheezes, no crackles, no rhonchi.  Fair air movement.  Speaking in full sentences.  Respiratory effort normal. Cardiovascular system: S1 & S2 heard, RRR. No JVD, murmurs, rubs, gallops or clicks. No pedal edema. Gastrointestinal system: Abdomen is nondistended, soft and some tenderness to palpation upper abdominal region.  Positive bowel sounds.  No rebound.  No guarding.  Central nervous system: Alert and oriented. No focal neurological deficits. Extremities: Symmetric 5 x 5 power. Skin: No rashes, lesions or ulcers Psychiatry: Judgement and insight appear normal. Mood & affect appropriate.     Data Reviewed: I have personally reviewed following labs and imaging studies  CBC: Recent Labs  Lab 03/28/22 1922 03/29/22 0325 03/30/22 0500 03/30/22 1534 03/31/22 0500  WBC 14.1* 12.8* 12.9* 15.3* 15.2*  NEUTROABS  --   --   --   --  13.3*  HGB 11.4* 9.3* 8.7* 9.0* 8.3*  HCT 32.9* 27.7* 25.6* 25.9* 24.3*  MCV 90.6 91.7 89.8 89.6 91.4  PLT 61* 54* 50* 57*  59*    Basic Metabolic Panel: Recent Labs  Lab 03/27/22 1945 03/28/22 1129 03/29/22 0325 03/30/22 0500 03/31/22 0500  NA 131* 133* 134* 136 138  K 3.4* 2.9* 4.0 3.4* 3.9  CL 97* 98 104 111 113*  CO2 22 20* 18* 16* 16*  GLUCOSE 156* 197* 140* 116* 98  BUN 17 25* 33* 27* 20  CREATININE 0.52 1.00 1.15* 1.15* 1.01*  CALCIUM 9.1 8.3* 7.9* 7.0* 7.2*   MG  --  1.9  --  1.8 2.4  PHOS  --  3.8  --   --   --     GFR: CrCl cannot be calculated (Unknown ideal weight.).  Liver Function Tests: Recent Labs  Lab 03/27/22 1945 03/28/22 1129 03/29/22 0325 03/30/22 0500 03/31/22 0500  AST 1,803* 1,332* 649* 258* 126*  ALT 546* 752* 582* 350* 267*  ALKPHOS 194* 176* 114 100 109  BILITOT 1.3* 3.4* 4.4* 2.8* 2.2*  PROT 6.9 5.7* 6.0* 4.5* 4.7*  ALBUMIN 3.6 3.1* 3.7 2.8* 2.4*    CBG: Recent Labs  Lab 03/29/22 1709 03/30/22 0001 03/30/22 0648 03/30/22 1303 03/31/22 0011  GLUCAP 124* 107* 112* 131* 93     Recent Results (from the past 240 hour(s))  Urine Culture     Status: Abnormal   Collection Time: 03/27/22  7:45 PM   Specimen: Urine, Clean Catch  Result Value Ref Range Status   Specimen Description   Final    URINE, CLEAN CATCH Performed at [facility], [address] [place], [place] [address]    Special Requests   Final    NONE Performed at [facility], [address] [place], [place] [address]    Culture >=100,000 COLONIES/mL ESCHERICHIA COLI (A)  Final   Report Status 03/30/2022 FINAL  Final   Organism ID, Bacteria ESCHERICHIA COLI (A)  Final      Susceptibility   Escherichia coli - MIC*    AMPICILLIN <=2 SENSITIVE Sensitive     CEFAZOLIN <=4 SENSITIVE Sensitive     CEFEPIME <=0.12 SENSITIVE Sensitive     CEFTRIAXONE <=0.25 SENSITIVE Sensitive     CIPROFLOXACIN <=0.25 SENSITIVE Sensitive     GENTAMICIN <=1 SENSITIVE Sensitive     IMIPENEM <=0.25 SENSITIVE Sensitive     NITROFURANTOIN <=16 SENSITIVE Sensitive     TRIMETH/SULFA <=20 SENSITIVE Sensitive     AMPICILLIN/SULBACTAM <=2 SENSITIVE Sensitive     PIP/TAZO <=4 SENSITIVE Sensitive     * >=100,000 COLONIES/mL ESCHERICHIA COLI  MRSA Next Gen by PCR, Nasal     Status: None   Collection Time: 03/28/22  9:39 PM   Specimen: Nasal Mucosa; Nasal Swab  Result Value Ref Range Status   MRSA by PCR Next Gen NOT DETECTED NOT DETECTED  Final    Comment: (NOTE) The GeneXpert MRSA Assay (FDA approved for NASAL specimens only), is one component of a comprehensive MRSA colonization surveillance program. It is not intended to diagnose MRSA infection nor to guide or monitor treatment for MRSA infections. Test performance is not FDA approved in patients less than 47 years old. Performed at [facility], [place] [address] [place], [place] [address]          Radiology Studies: DG ERCP  Result Date: 03/29/2022 CLINICAL DATA:  ERCP EXAM: ERCP TECHNIQUE: Multiple spot images obtained with the fluoroscopic device and submitted for interpretation post-procedure. FLUOROSCOPY TIME: FLUOROSCOPY TIME 1 minute, 10 seconds (30.7 mGy) COMPARISON:  MRCP-03/28/2022 FINDINGS: Six spot intraoperative fluoroscopic  images of the right upper abdominal quadrant during ERCP are provided for review. Initial image demonstrates an ERCP probe overlying the right upper abdominal quadrant. Subsequent images demonstrate selective cannulation and opacification of the common bile duct which appears mild to moderately dilated and potentially slightly irregular though this is potentially artifactual due to opacification. Subsequent images demonstrate insufflation of a balloon within the central aspect of the CBD with subsequent biliary sweeping and presumed sphincterotomy. There is minimal opacification of the intrahepatic biliary tree which appears mildly dilated. There is no definitive opacification of either the cystic or pancreatic ducts. IMPRESSION: ERCP with biliary sweeping and presumed sphincterotomy as above. These images were submitted for radiologic interpretation only. Please see the procedural report for the amount of contrast and the fluoroscopy time utilized. Electronically Signed   By: Sandi Mariscal M.D.   On: 03/29/2022 14:13        Scheduled Meds:  Chlorhexidine Gluconate Cloth  6 each Topical Q2200   levothyroxine  75 mcg Oral  Q0600   lip balm   Topical BID   polyethylene glycol  17 g Oral Daily   Continuous Infusions:  sodium chloride     sodium chloride Stopped (03/29/22 1113)   famotidine (PEPCID) IV Stopped (03/31/22 0012)   methocarbamol (ROBAXIN) IV     ondansetron (ZOFRAN) IV     piperacillin-tazobactam (ZOSYN)  IV 3.375 g (03/31/22 0930)   sodium bicarbonate 150 mEq in sterile water 1,150 mL infusion 75 mL/hr at 03/31/22 1110     LOS: 3 days    Time spent: 40 minutes    Irine Seal, MD [facility]   To contact the attending provider between 7A-7P or the covering provider during after hours 7P-7A, please log into the web site [email or web address] and access using universal [place] password for that web site. If you do not have the password, please call the hospital operator.  03/31/2022, 11:38 AM

## 2022-03-31 NOTE — Op Note (Signed)
Operative Note   Lauren [name] 82 y.o. female  [id number]  03/31/2022   Surgeon: Ralene Ok MD   Assistant: Eben Burow MD ([id number])    Procedure performed: Laparoscopic Partial Cholecystectomy    Preop diagnosis: Cholangiitis, choledocholithiasis Post-op diagnosis/intraop findings: same   Specimens: gallbladder   Retained items: no   EBL: minimal   Complications: none   Description of procedure: After obtaining informed consent the patient was brought to the operating room. Antibiotics were administered. SCD's were applied. General endotracheal anesthesia was initiated and a formal time-out was performed. The abdomen was prepped and draped in the usual sterile fashion and the abdomen was entered using  a right subcostal Veress needle  after instilling the site with local. Insufflation to 5mHg was obtained, infraumbilical 11 mm trocar and camera inserted, and gross inspection revealed no evidence of injury from our entry or other intraabdominal abnormalities.  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  Two 557mtrocars were introduced in the right midclavicular and right anterior axillary lines under direct visualization and following infiltration with local. A 5 mm trocar was placed in the epigastrium. The gallbladder was distended and severely inflamed. A needle decompression of the gallbladder revealed purulent fluid inside the gallbladder. The gallbladder was decompressed. The gallbladder was retracted cephalad and the infundibulum was retracted laterally. The infundibulum was fused with the cystic duct and common bile duct. Attempts to circumferentially dissect around the cystic duct were not successful and the critical view of safety could not be safely achieved. At that point a top-down approach with the goal to perform a partial cholecystectomy was  attempted. The gallbladder was dissected from the liver plate using electrocautery and the gallbladder was amputated at the infundibulum leaving a cuff. Two endoloops were used to ligate the cuff of the infundibulum.   Once freed the gallbladder was placed in an endocatch bag and removed intact through the infraumbilical trocar site. A minimal amount of bleeding on the liver bed was controlled with cautery.  The right upper quadrant was then irrigated, the effluent was clear. Hemostasis was once again confirmed, and reinspection of the abdomen revealed no injuries.. The 1155mrocar site in the infraumbilical area was closed with a 0 vicryl in the fascia under direct visualization using a Carter-Thomson device. The abdomen was desufflated and all trocars removed. The skin incisions were closed with subcuticular 4-0 monocryl and Dermabond. The patient was awakened, extubated and transported to the recovery room in stable condition.    All counts were correct at the completion of the case.

## 2022-03-31 NOTE — Progress Notes (Signed)
Moved to room 1410. Son Barnabas Lister notified by phone

## 2022-03-31 NOTE — Discharge Instructions (Signed)
[facility]  Please arrive at least 30 min before your appointment to complete your check in paperwork.  If you are unable to arrive 30 min prior to your appointment time we may have to cancel or reschedule you. LAPAROSCOPIC SURGERY: POST OP INSTRUCTIONS Always review your discharge instruction sheet given to you by the facility where your surgery was performed. IF YOU HAVE DISABILITY OR FAMILY LEAVE FORMS, YOU MUST BRING THEM TO THE OFFICE FOR PROCESSING.   DO NOT GIVE THEM TO YOUR DOCTOR.  PAIN CONTROL  First take acetaminophen (Tylenol) AND/or ibuprofen (Advil) to control your pain after surgery.  Follow directions on package.  Taking acetaminophen (Tylenol) and/or ibuprofen (Advil) regularly after surgery will help to control your pain and lower the amount of prescription pain medication you may need.  You should not take more than 4,000 mg (4 grams) of acetaminophen (Tylenol) in 24 hours.  You should not take ibuprofen (Advil), aleve, motrin, naprosyn or other NSAIDS if you have a history of stomach ulcers or chronic kidney disease.  A prescription for pain medication may be given to you upon discharge.  Take your pain medication as prescribed, if you still have uncontrolled pain after taking acetaminophen (Tylenol) or ibuprofen (Advil). Use ice packs to help control pain. If you need a refill on your pain medication, please contact your pharmacy.  They will contact our office to request authorization. Prescriptions will not be filled after 5pm or on week-ends.  HOME MEDICATIONS Take your usually prescribed medications unless otherwise directed.  DIET You should follow a light diet the first few days after arrival home.  Be sure to include lots of fluids daily. Avoid fatty, fried foods.   CONSTIPATION It is common to experience some constipation after surgery and if you are taking pain medication.  Increasing fluid intake and taking a stool softener (such as Colace)  will usually help or prevent this problem from occurring.  A mild laxative (Milk of Magnesia or Miralax) should be taken according to package instructions if there are no bowel movements after 48 hours.  WOUND/INCISION CARE Most patients will experience some swelling and bruising in the area of the incisions.  Ice packs will help.  Swelling and bruising can take several days to resolve.  Unless discharge instructions indicate otherwise, follow guidelines below  STERI-STRIPS - you may remove your outer bandages 48 hours after surgery, and you may shower at that time.  You have steri-strips (small skin tapes) in place directly over the incision.  These strips should be left on the skin for 7-10 days.   DERMABOND/SKIN GLUE - you may shower in 24 hours.  The glue will flake off over the next 2-3 weeks. Any sutures or staples will be removed at the office during your follow-up visit.  ACTIVITIES You may resume regular (light) daily activities beginning the next day--such as daily self-care, walking, climbing stairs--gradually increasing activities as tolerated.  You may have sexual intercourse when it is comfortable.  Refrain from any heavy lifting or straining until approved by your doctor. You may drive when you are no longer taking prescription pain medication, you can comfortably wear a seatbelt, and you can safely maneuver your car and apply brakes.  FOLLOW-UP You should see your doctor in the office for a follow-up appointment approximately 2-3 weeks after your surgery.  You should have been given your post-op/follow-up appointment when your surgery was scheduled.  If you did not receive a post-op/follow-up appointment, make sure   that you call for this appointment within a day or two after you arrive home to insure a convenient appointment time.   WHEN TO CALL YOUR DOCTOR: Fever over 101.0 Inability to urinate Continued bleeding from incision. Increased pain, redness, or drainage from the  incision. Increasing abdominal pain  The clinic staff is available to answer your questions during regular business hours.  Please don't hesitate to call and ask to speak to one of the nurses for clinical concerns.  If you have a medical emergency, go to the nearest emergency room or call 911.  A surgeon from [facility] is always on call at the hospital. [address], [address], [place], [place]  [address] ? [address] [address], [place], [place]   [address] [phone] ? [phone] ? FAX [phone]  

## 2022-03-31 NOTE — Anesthesia Preprocedure Evaluation (Signed)
Anesthesia Evaluation  Patient identified by MRN, date of birth, ID band Patient awake    Reviewed: Allergy & Precautions, H&P , NPO status , Patient's Chart, lab work & pertinent test results  Airway Mallampati: II  TM Distance: <3 FB Neck ROM: Full    Dental no notable dental hx.    Pulmonary neg pulmonary ROS   Pulmonary exam normal breath sounds clear to auscultation       Cardiovascular hypertension, Pt. on medications Normal cardiovascular exam Rhythm:Regular Rate:Normal     Neuro/Psych negative neurological ROS  negative psych ROS   GI/Hepatic Neg liver ROS, hiatal hernia,GERD  Medicated,,  Endo/Other  diabetes, Type 2Hypothyroidism    Renal/GU   negative genitourinary   Musculoskeletal negative musculoskeletal ROS (+)    Abdominal   Peds negative pediatric ROS (+)  Hematology  (+) Blood dyscrasia, anemia thrombocytopenia   Anesthesia Other Findings   Reproductive/Obstetrics negative OB ROS                             Anesthesia Physical Anesthesia Plan  ASA: 3  Anesthesia Plan: General   Post-op Pain Management:    Induction: Intravenous  PONV Risk Score and Plan: Ondansetron, Dexamethasone and Treatment may vary due to age or medical condition  Airway Management Planned: Oral ETT  Additional Equipment:   Intra-op Plan:   Post-operative Plan: Extubation in OR  Informed Consent: I have reviewed the patients History and Physical, chart, labs and discussed the procedure including the risks, benefits and alternatives for the proposed anesthesia with the patient or authorized representative who has indicated his/her understanding and acceptance.     Dental advisory given  Plan Discussed with: CRNA and Surgeon  Anesthesia Plan Comments:        Anesthesia Quick Evaluation

## 2022-03-31 NOTE — Transfer of Care (Signed)
Immediate Anesthesia Transfer of Care Note  Patient: Lauren [name]  Procedure(s) Performed: LAPAROSCOPIC CHOLECYSTECTOMY  Patient Location: PACU  Anesthesia Type:General  Level of Consciousness: awake, alert , and oriented  Airway & Oxygen Therapy: Patient Spontanous Breathing and Patient connected to face mask oxygen  Post-op Assessment: Report given to RN and Post -op Vital signs reviewed and stable  Post vital signs: Reviewed and stable  Last Vitals:  Vitals Value Taken Time  BP 125/57 03/31/22 1511  Temp    Pulse 77 03/31/22 1513  Resp 16 03/31/22 1513  SpO2 98 % 03/31/22 1513  Vitals shown include unvalidated device data.  Last Pain:  Vitals:   03/31/22 1511  TempSrc: (P) Skin  PainSc:          Complications: No notable events documented.

## 2022-04-01 ENCOUNTER — Encounter (HOSPITAL_COMMUNITY): Payer: Self-pay | Admitting: General Surgery

## 2022-04-01 DIAGNOSIS — K8309 Other cholangitis: Secondary | ICD-10-CM | POA: Diagnosis not present

## 2022-04-01 DIAGNOSIS — N390 Urinary tract infection, site not specified: Secondary | ICD-10-CM | POA: Diagnosis not present

## 2022-04-01 DIAGNOSIS — E872 Acidosis, unspecified: Secondary | ICD-10-CM | POA: Diagnosis not present

## 2022-04-01 DIAGNOSIS — E876 Hypokalemia: Secondary | ICD-10-CM | POA: Diagnosis not present

## 2022-04-01 LAB — COMPREHENSIVE METABOLIC PANEL
ALT: 193 U/L — ABNORMAL HIGH (ref 0–44)
AST: 73 U/L — ABNORMAL HIGH (ref 15–41)
Albumin: 2.1 g/dL — ABNORMAL LOW (ref 3.5–5.0)
Alkaline Phosphatase: 95 U/L (ref 38–126)
Anion gap: 16 — ABNORMAL HIGH (ref 5–15)
BUN: 23 mg/dL (ref 8–23)
CO2: 17 mmol/L — ABNORMAL LOW (ref 22–32)
Calcium: 7 mg/dL — ABNORMAL LOW (ref 8.9–10.3)
Chloride: 105 mmol/L (ref 98–111)
Creatinine, Ser: 1.01 mg/dL — ABNORMAL HIGH (ref 0.44–1.00)
GFR, Estimated: 55 mL/min — ABNORMAL LOW (ref >60)
Glucose, Bld: 163 mg/dL — ABNORMAL HIGH (ref 70–99)
Potassium: 3.8 mmol/L (ref 3.5–5.1)
Sodium: 138 mmol/L (ref 135–145)
Total Bilirubin: 2.3 mg/dL — ABNORMAL HIGH (ref 0.3–1.2)
Total Protein: 4.5 g/dL — ABNORMAL LOW (ref 6.5–8.1)

## 2022-04-01 LAB — GLUCOSE, CAPILLARY
Glucose-Capillary: 145 mg/dL — ABNORMAL HIGH (ref 70–99)
Glucose-Capillary: 147 mg/dL — ABNORMAL HIGH (ref 70–99)
Glucose-Capillary: 149 mg/dL — ABNORMAL HIGH (ref 70–99)
Glucose-Capillary: 155 mg/dL — ABNORMAL HIGH (ref 70–99)
Glucose-Capillary: 159 mg/dL — ABNORMAL HIGH (ref 70–99)
Glucose-Capillary: 169 mg/dL — ABNORMAL HIGH (ref 70–99)

## 2022-04-01 LAB — CBC WITH DIFFERENTIAL/PLATELET
Abs Immature Granulocytes: 0.44 10*3/uL — ABNORMAL HIGH (ref 0.00–0.07)
Basophils Absolute: 0 10*3/uL (ref 0.0–0.1)
Basophils Relative: 0 %
Eosinophils Absolute: 0 10*3/uL (ref 0.0–0.5)
Eosinophils Relative: 0 %
HCT: 24.8 % — ABNORMAL LOW (ref 36.0–46.0)
Hemoglobin: 8.4 g/dL — ABNORMAL LOW (ref 12.0–15.0)
Immature Granulocytes: 5 %
Lymphocytes Relative: 10 %
Lymphs Abs: 1 10*3/uL (ref 0.7–4.0)
MCH: 30.9 pg (ref 26.0–34.0)
MCHC: 33.9 g/dL (ref 30.0–36.0)
MCV: 91.2 fL (ref 80.0–100.0)
Monocytes Absolute: 0.4 10*3/uL (ref 0.1–1.0)
Monocytes Relative: 4 %
Neutro Abs: 8 10*3/uL — ABNORMAL HIGH (ref 1.7–7.7)
Neutrophils Relative %: 81 %
Platelets: 71 10*3/uL — ABNORMAL LOW (ref 150–400)
RBC: 2.72 MIL/uL — ABNORMAL LOW (ref 3.87–5.11)
RDW: 16.6 % — ABNORMAL HIGH (ref 11.5–15.5)
WBC: 9.9 10*3/uL (ref 4.0–10.5)
nRBC: 0.2 % (ref 0.0–0.2)

## 2022-04-01 LAB — HEMOGLOBIN A1C
Hgb A1c MFr Bld: 6.3 % — ABNORMAL HIGH (ref 4.8–5.6)
Mean Plasma Glucose: 134.11 mg/dL

## 2022-04-01 MED ORDER — ACETAMINOPHEN 500 MG PO TABS
1000.0000 mg | ORAL_TABLET | Freq: Four times a day (QID) | ORAL | Status: DC
  Administered 2022-04-01 – 2022-04-07 (×10): 1000 mg via ORAL
  Filled 2022-04-01 (×17): qty 2

## 2022-04-01 MED ORDER — SODIUM CHLORIDE 0.9% FLUSH
10.0000 mL | Freq: Two times a day (BID) | INTRAVENOUS | Status: DC
  Administered 2022-04-05 – 2022-04-09 (×5): 10 mL

## 2022-04-01 MED ORDER — TRAMADOL HCL 50 MG PO TABS: 50.0000 mg | ORAL_TABLET | Freq: Four times a day (QID) | ORAL | Status: DC | PRN

## 2022-04-01 MED ORDER — CALCIUM GLUCONATE-NACL 1-0.675 GM/50ML-% IV SOLN
1.0000 g | Freq: Once | INTRAVENOUS | Status: AC
  Administered 2022-04-01: 1000 mg via INTRAVENOUS
  Filled 2022-04-01: qty 50

## 2022-04-01 MED ORDER — ENOXAPARIN SODIUM 40 MG/0.4ML IJ SOSY: 40.0000 mg | PREFILLED_SYRINGE | INTRAMUSCULAR | Status: DC

## 2022-04-01 NOTE — Progress Notes (Signed)
PROGRESS NOTE    MELAINIE [name]  NLG:[id number] DOB: 04/13/1940 DOA: 03/27/2022 PCP: Jani Gravel, MD    Chief Complaint  Patient presents with   Abdominal Pain    Brief Narrative:  82 year old woman admitted to hospital with abdominal pain, several days of poor p.o. intake found to have cholangitis versus cholecystitis.   History largely per son at bedside.  Several days of poor appetite.  Not eating very much.  Abdominal pain worsened after eating bacon.  Presented to the ED.  They are ultrasound with dilated bile duct, sludge, gallbladder thickening.  MRCP subsequently obtained.  This demonstrated concern for cholangitis versus cholecystitis.  Surgery and GI were consulted.  Felt cholecystitis was less likely, most likely cholangitis.  Plan for ERCP 1/15 which was concerning for ascending cholangitis and acute cholecystitis.  Patient noted on the afternoon of 03/28/2022 to be, more hypotensive.  Not responding to fluids.  Transferred to the stepdown unit.  Patient required pressors.  Labs notable for creatinine doubled.  LFTs essentially stable with rising T. bili.  General surgery assessed patient as well.  Patient weaned off pressors transferred to the floor and transferred to [facility] service.  Patient for laparoscopic cholecystectomy 03/31/2022.   Assessment & Plan:   Principal Problem:   Ascending cholangitis Active Problems:   HTN (hypertension)   Hyperlipidemia   Type 2 diabetes mellitus (HCC)   GERD (gastroesophageal reflux disease)   Hypothyroidism   Nausea without vomiting   Chronic kidney disease, stage 2 (mild)   Iron deficiency anemia   Recurrent urinary tract infection   Vertigo   Hepatitis   Hypokalemia   LFT elevation   Shock (HCC)   Metabolic acidosis   Acute cholecystitis   Transaminitis   Anemia of chronic disease   Thrombocytopenia (HCC)   Septic shock (HCC)   Urinary tract infection without hematuria  #1 septic shock secondary to acute cholangitis and  probable acute cholecystitis,/transaminitis.  POA -Patient noted during the hospitalization to be persistently hypotensive despite IV fluid resuscitation requiring pressors. -CT angiogram abdomen and pelvis done with no evidence of aortic aneurysm or dissection.  Aortic atherosclerosis.  Old granulomatous disease.  Hepatic steatosis. -Right upper quadrant ultrasound done with small amount of gallbladder sludge however no definite gallstones identified.  Mild diffuse gallbladder gallbladder wall thickening which is nonspecific.  Mild dilatation of CBD measuring 10 mm containing sludge. -Workup done including MRCP with mild gallbladder wall thickening, pericholecystic edema highly suspicious for acute cholecystitis.  Mild dilatation of central intrahepatic bile ducts and CBD to the level of the ampulla.  T2 hypointense/seen throughout the common bile duct without definite calculi.  Cholangitis cannot be excluded.  Small several subscapular wedge-shaped signal abnormalities and there are peripheral right and left hepatic lobes noted nonspecific.  Mild hepatic steatosis.  Mild perihepatic ascites. -ERCP done by GI concerning for ascending cholangitis based upon dark bile and purulent debris.  Concern for cholecystitis as well. -Patient slowly improving clinically, currently on IV Zosyn. -Patient seen by general surgery and patient s/p laparoscopic partial cholecystectomy on 03/31/2022 purulent fluid noted inside gallbladder.  -IV fluids, pain management. -Patient on clear liquids and tolerating. -Per general surgery and GI. -Mobilize.  2.  Metabolic acidosis -Likely second to acute infection. -Continue empiric IV antibiotics. -Continue bicarb drip.   3.  Anemia of chronic disease -Hemoglobin currently at 8.4, likely dilutional, patient with no overt bleeding. -Anemia panel consistent with anemia of chronic disease. -Follow H&H. -Transfusion hemoglobin < 7.  4.  Acute renal failure -Likely  secondary to a prerenal azotemia and in the setting of ARB. -Urine output of 2.450 L over the past 24 hours. -Renal function improving.  5.  Hypokalemia -Repleted. -Potassium at 3.8. -Magnesium at 2.4.  6.  Thrombocytopenia -Likely secondary to acute infection. -Patient with no overt bleeding. -Platelet count stable and slowly trending back up. -Follow. - liver function improving with hydration.  7.  E. coli UTI -On IV Zosyn.  8.  Hypothyroidism -Synthroid.   9.  Hypertension -Continue to hold home antihypertensive medications as patient had presented with septic shock.  Follow BP.  10.  Diabetes mellitus type 2 -Last hemoglobin A1c 6.0 on 02/15/2020. -Repeat hemoglobin A1c 6.3 (04/01/2022). -CBG 169 this morning. -Hold home oral hypoglycemic agents. -SSI.  11.  Hypocalcemia -Corrected calcium of 8.52. -Calcium gluconate 1 g IV x 1. -Check a vitamin D 25-hydroxy level. -Repeat labs in the AM.    DVT prophylaxis: SCDs.  Postop DVT prophylaxis per general surgery. Code Status: Full Family Communication: Updated patient and granddaughter at bedside. Disposition: Likely home with home health once medically improved and stable.  And cleared by GI and general surgery.  Status is: Inpatient Remains inpatient appropriate because: Severity of illness.   Consultants:  Gastroenterology: Dr. Benson Norway 03/28/2022 General surgery: Dr. Johney Maine 03/28/2022 PCCM: Dr. Silas Flood 03/28/2022  Procedures:  ERCP with sphincterotomy/papillotomy per GI, Dr. Carlean Purl 03/29/2022 CT angiogram chest abdomen and pelvis 03/27/2022 MRCP 03/28/2022 Right upper quadrant ultrasound 03/28/2022 Laparoscopic partial cholecystectomy per Dr. Rosendo Gros, general surgery 03/31/2022  Significant Hospital Events: Including procedures, antibiotic start and stop dates in addition to other pertinent events   03/28/2021 admitted to the hospital after a week of poor p.o. intake, with abdominal pain, likely cholangitis versus  cholecystitis, on levophed 1/16 Off levophed,  Antimicrobials:  IV Rocephin 1/14/ 2024 x 1 dose IV Zosyn 03/28/2022>>>>>>>   Subjective: Patient sitting up in chair.  Sleeping but easily arousable.  Granddaughter at bedside and interpreting.  Patient denies any chest pain.  No shortness of breath.  Improvement with abdominal pain.  Foley catheter in place.  Noted to have difficulty moving.  Tolerating clears.  Objective: Vitals:   03/31/22 2047 03/31/22 2356 04/01/22 0306 04/01/22 0807  BP: (!) 142/66 135/62 129/61 135/60  Pulse: 77 78 97 74  Resp: '15 17 17 18  '$ Temp: (!) 97.5 F (36.4 C) (!) 97.3 F (36.3 C) 97.6 F (36.4 C) (!) 97.3 F (36.3 C)  TempSrc:      SpO2: 100% 100% 100% 96%    Intake/Output Summary (Last 24 hours) at 04/01/2022 1116 Last data filed at 04/01/2022 0620 Gross per 24 hour  Intake 1923.8 ml  Output 1400 ml  Net 523.8 ml    There were no vitals filed for this visit.  Examination:  General exam: NAD. Respiratory system: Lungs CTA B anterior lung fields.  No wheezes, no crackles, no rhonchi.  Fair air movement.  Speaking in full sentences.  Normal respiratory effort.  Cardiovascular system: Regular rate rhythm no murmurs rubs or gallops.  No JVD.  No lower extremity edema.  Gastrointestinal system: Abdomen soft, mildly distended, no significant tenderness to palpation palpation, incision site c/d/i.  Positive bowel sounds.  No rebound.  No guarding.  Central nervous system: Alert and oriented. No focal neurological deficits. Extremities: Symmetric 5 x 5 power. Skin: No rashes, lesions or ulcers Psychiatry: Judgement and insight appear normal. Mood & affect appropriate.     Data Reviewed: I have personally reviewed  following labs and imaging studies  CBC: Recent Labs  Lab 03/29/22 0325 03/30/22 0500 03/30/22 1534 03/31/22 0500 04/01/22 0528  WBC 12.8* 12.9* 15.3* 15.2* 9.9  NEUTROABS  --   --   --  13.3* 8.0*  HGB 9.3* 8.7* 9.0* 8.3* 8.4*   HCT 27.7* 25.6* 25.9* 24.3* 24.8*  MCV 91.7 89.8 89.6 91.4 91.2  PLT 54* 50* 57* 59* 71*     Basic Metabolic Panel: Recent Labs  Lab 03/28/22 1129 03/29/22 0325 03/30/22 0500 03/31/22 0500 04/01/22 0528  NA 133* 134* 136 138 138  K 2.9* 4.0 3.4* 3.9 3.8  CL 98 104 111 113* 105  CO2 20* 18* 16* 16* 17*  GLUCOSE 197* 140* 116* 98 163*  BUN 25* 33* 27* 20 23  CREATININE 1.00 1.15* 1.15* 1.01* 1.01*  CALCIUM 8.3* 7.9* 7.0* 7.2* 7.0*  MG 1.9  --  1.8 2.4  --   PHOS 3.8  --   --   --   --      GFR: CrCl cannot be calculated (Unknown ideal weight.).  Liver Function Tests: Recent Labs  Lab 03/28/22 1129 03/29/22 0325 03/30/22 0500 03/31/22 0500 04/01/22 0528  AST 1,332* 649* 258* 126* 73*  ALT 752* 582* 350* 267* 193*  ALKPHOS 176* 114 100 109 95  BILITOT 3.4* 4.4* 2.8* 2.2* 2.3*  PROT 5.7* 6.0* 4.5* 4.7* 4.5*  ALBUMIN 3.1* 3.7 2.8* 2.4* 2.1*     CBG: Recent Labs  Lab 03/31/22 1518 03/31/22 1827 03/31/22 2359 04/01/22 0615 04/01/22 0736  GLUCAP 110* 126* 145* 155* 169*      Recent Results (from the past 240 hour(s))  Urine Culture     Status: Abnormal   Collection Time: 03/27/22  7:45 PM   Specimen: Urine, Clean Catch  Result Value Ref Range Status   Specimen Description   Final    URINE, CLEAN CATCH Performed at [facility], [address] [place],  [address]    Special Requests   Final    NONE Performed at [facility], [address] [place], [place] [address]    Culture >=100,000 COLONIES/mL ESCHERICHIA COLI (A)  Final   Report Status 03/30/2022 FINAL  Final   Organism ID, Bacteria ESCHERICHIA COLI (A)  Final      Susceptibility   Escherichia coli - MIC*    AMPICILLIN <=2 SENSITIVE Sensitive     CEFAZOLIN <=4 SENSITIVE Sensitive     CEFEPIME <=0.12 SENSITIVE Sensitive     CEFTRIAXONE <=0.25 SENSITIVE Sensitive     CIPROFLOXACIN <=0.25 SENSITIVE Sensitive     GENTAMICIN <=1 SENSITIVE Sensitive      IMIPENEM <=0.25 SENSITIVE Sensitive     NITROFURANTOIN <=16 SENSITIVE Sensitive     TRIMETH/SULFA <=20 SENSITIVE Sensitive     AMPICILLIN/SULBACTAM <=2 SENSITIVE Sensitive     PIP/TAZO <=4 SENSITIVE Sensitive     * >=100,000 COLONIES/mL ESCHERICHIA COLI  MRSA Next Gen by PCR, Nasal     Status: None   Collection Time: 03/28/22  9:39 PM   Specimen: Nasal Mucosa; Nasal Swab  Result Value Ref Range Status   MRSA by PCR Next Gen NOT DETECTED NOT DETECTED Final    Comment: (NOTE) The GeneXpert MRSA Assay (FDA approved for NASAL specimens only), is one component of a comprehensive MRSA colonization surveillance program. It is not intended to diagnose MRSA infection nor to guide or monitor treatment for MRSA infections. Test performance is not FDA approved in patients less  than 74 years old. Performed at [facility], [place] [address] [place], [place] [address]          Radiology Studies: No results found.      Scheduled Meds:  acetaminophen  1,000 mg Oral Q6H   Chlorhexidine Gluconate Cloth  6 each Topical Q2200   insulin aspart  0-9 Units Subcutaneous TID WC   levothyroxine  75 mcg Oral Q0600   lip balm   Topical BID   polyethylene glycol  17 g Oral Daily   sodium chloride flush  10-40 mL Intracatheter Q12H   Continuous Infusions:  sodium chloride Stopped (03/29/22 1113)   famotidine (PEPCID) IV 20 mg (03/31/22 2029)   methocarbamol (ROBAXIN) IV     ondansetron (ZOFRAN) IV     piperacillin-tazobactam (ZOSYN)  IV 3.375 g (04/01/22 1058)   sodium bicarbonate 150 mEq in sterile water 1,150 mL infusion 75 mL/hr at 04/01/22 0033     LOS: 4 days    Time spent: 40 minutes    Irine Seal, MD [facility]   To contact the attending provider between 7A-7P or the covering provider during after hours 7P-7A, please log into the web site [email or web address] and access using universal [place] password for that web site. If you do not have the  password, please call the hospital operator.  04/01/2022, 11:16 AM

## 2022-04-01 NOTE — Progress Notes (Addendum)
1 Day Post-Op   Subjective/Chief Complaint: Sleepy this am. Has worked with therapies and been up to bedside commode for Bm. Tolerating CLD. Denies significant abdominal pain  Granddaughter bedside  Objective: Vital signs in last 24 hours: Temp:  [95.8 F (35.4 C)-98 F (36.7 C)] 97.3 F (36.3 C) (01/18 0807) Pulse Rate:  [72-97] 74 (01/18 0807) Resp:  [11-20] 18 (01/18 0807) BP: (124-148)/(57-77) 135/60 (01/18 0807) SpO2:  [94 %-100 %] 96 % (01/18 0807) Last BM Date : 03/30/22  Intake/Output from previous day: 01/17 0701 - 01/18 0700 In: 2640 [I.V.:2342.5; IV Piggyback:297.5] Out: 2500 [Urine:2450; Blood:50] Intake/Output this shift: No intake/output data recorded.  PE:  Constitutional: No acute distress, conversant, appears stated age. CV: regular rate and rhythm GI: Soft, very mild TTP over incisions which are c/d/i Skin: No rashes, palpation reveals normal turgor Psychiatric: appropriate judgment and insight, oriented to person, place, and time   Lab Results:  Recent Labs    03/31/22 0500 04/01/22 0528  WBC 15.2* 9.9  HGB 8.3* 8.4*  HCT 24.3* 24.8*  PLT 59* 71*    BMET Recent Labs    03/31/22 0500 04/01/22 0528  NA 138 138  K 3.9 3.8  CL 113* 105  CO2 16* 17*  GLUCOSE 98 163*  BUN 20 23  CREATININE 1.01* 1.01*  CALCIUM 7.2* 7.0*    PT/INR No results for input(s): "LABPROT", "INR" in the last 72 hours. ABG No results for input(s): "PHART", "HCO3" in the last 72 hours.  Invalid input(s): "PCO2", "PO2"  Studies/Results: No results found.  Anti-infectives: Anti-infectives (From admission, onward)    Start     Dose/Rate Route Frequency Ordered Stop   03/29/22 2100  piperacillin-tazobactam (ZOSYN) IVPB 3.375 g  Status:  Discontinued        3.375 g 12.5 mL/hr over 240 Minutes Intravenous Every 8 hours 03/28/22 2051 03/29/22 0947   03/29/22 1000  piperacillin-tazobactam (ZOSYN) IVPB 3.375 g        3.375 g 12.5 mL/hr over 240 Minutes  Intravenous Every 8 hours 03/29/22 0947     03/29/22 0800  cefTRIAXone (ROCEPHIN) 2 g in sodium chloride 0.9 % 100 mL IVPB  Status:  Discontinued        2 g 200 mL/hr over 30 Minutes Intravenous Every 24 hours 03/28/22 1058 03/28/22 1212   03/29/22 0800  cefTRIAXone (ROCEPHIN) 1 g in sodium chloride 0.9 % 100 mL IVPB  Status:  Discontinued        1 g 200 mL/hr over 30 Minutes Intravenous Every 24 hours 03/28/22 1212 03/28/22 1603   03/28/22 1700  piperacillin-tazobactam (ZOSYN) IVPB 3.375 g  Status:  Discontinued        3.375 g 12.5 mL/hr over 240 Minutes Intravenous Every 8 hours 03/28/22 1603 03/28/22 2051   03/28/22 1115  metroNIDAZOLE (FLAGYL) IVPB 500 mg  Status:  Discontinued        500 mg 100 mL/hr over 60 Minutes Intravenous 2 times daily 03/28/22 1058 03/28/22 1609   03/28/22 0645  cefTRIAXone (ROCEPHIN) 1 g in sodium chloride 0.9 % 100 mL IVPB        1 g 200 mL/hr over 30 Minutes Intravenous  Once 03/28/22 0644 03/28/22 0801       Assessment/Plan: 18F with cholangitis and choledocholithiasis - POD1 s/p laparoscopic partial cholecystectomy 1/17 AR - leukocytosis resolved, LFTs remain elevated but improving. T bili 2.3 - added tramadol for pain control (reaction of nausea per chart, antiemetics available and discussed with RN  giving prior to pain med if needed) - tolerating CLD, advance to FLD - IS, PT/OT, mobilize  FEN: FLD ID: [email or web address]  VTE: SCDs, hold LMWH for thrombocytopenia Foley: placed 1/14   LOS: 4 days    Winferd Humphrey, [facility] 04/01/2022, 10:29 AM Please see Amion for pager number during day hours 7:00am-4:30pm

## 2022-04-01 NOTE — Anesthesia Postprocedure Evaluation (Signed)
Anesthesia Post Note  Patient: Lauren [name]  Procedure(s) Performed: LAPAROSCOPIC CHOLECYSTECTOMY     Patient location during evaluation: PACU Anesthesia Type: General Level of consciousness: awake and alert Pain management: pain level controlled Vital Signs Assessment: post-procedure vital signs reviewed and stable Respiratory status: spontaneous breathing, nonlabored ventilation, respiratory function stable and patient connected to nasal cannula oxygen Cardiovascular status: blood pressure returned to baseline and stable Postop Assessment: no apparent nausea or vomiting Anesthetic complications: no  No notable events documented.  Last Vitals:  Vitals:   04/01/22 0306 04/01/22 0807  BP: 129/61 135/60  Pulse: 97 74  Resp: 17 18  Temp: 36.4 C (!) 36.3 C  SpO2: 100% 96%    Last Pain:  Vitals:   03/31/22 2006  TempSrc:   PainSc: Asleep                 Lanina Larranaga S

## 2022-04-01 NOTE — Progress Notes (Signed)
Physical Therapy Treatment Patient Details Name: Lauren [name] MRN: [id number] DOB: 19-Oct-1940 Today's Date: 04/01/2022   History of Present Illness Patient is 82 y.o. female admitted to hospital with abdominal pain, several days of poor p.o. intake found to have cholangitis versus cholecystitis. Pt s/p ERCP on 03/29/22 with plan for lap chole on 03/31/22. PMH significant for OA, DM, GERD, HLD, HTN, hypothyroidism, osteoporosis, Lt [facility] in 2021.    PT Comments    Pt is slowly progressing toward acute PT goals this session. Pt performed supine to sit, sit to stand, and step pivot transfers with MIN A. Repeated cuing for initiation and increased time for processing, pt very lethargic throughout session. [facility] MIN A and use of RW. Pt will benefit from continued skilled PT to increase their independence and maximize safety with mobility.     Recommendations for follow up therapy are one component of a multi-disciplinary discharge planning process, led by the attending physician.  Recommendations may be updated based on patient status, additional functional criteria and insurance authorization.  Follow Up Recommendations  Home health PT     Assistance Recommended at Discharge Frequent or constant Supervision/Assistance  Patient can return home with the following A lot of help with walking and/or transfers;A lot of help with bathing/dressing/bathroom;Assistance with cooking/housework;Direct supervision/assist for medications management;Assist for transportation;Help with stairs or ramp for entrance   Equipment Recommendations  None recommended by PT    Recommendations for Other Services       Precautions / Restrictions Precautions Precautions: Fall Restrictions Weight Bearing Restrictions: No     Mobility  Bed Mobility Overal bed mobility: Needs Assistance Bed Mobility: Supine to Sit     Supine to sit: Min assist, HOB elevated     General bed  mobility comments: Required multimodal cues for initation of bringing LE's to EOB. Able to scoot to EOB with significantly increased time    Transfers Overall transfer level: Needs assistance Equipment used: Rolling walker (2 wheels) Transfers: Sit to/from Stand, Bed to chair/wheelchair/BSC Sit to Stand: Mod assist, Min assist   Step pivot transfers: Min assist       General transfer comment: x2; MIN assist for rise and steady from EOB. MOD [facility].  Able to take steps over to [facility] E[facility] with MIn A. Able to stand for prolonged period at EOB and upon standing from [facility] with [facility]are. Pt's granddaughter and RN present throughout to assist as needed.    Ambulation/Gait Ambulation/Gait assistance: Min assist Gait Distance (Feet): 6 Feet Assistive device: Rolling walker (2 wheels) Gait Pattern/deviations: Step-through pattern, Decreased stride length, Narrow base of support, Trunk flexed Gait velocity: decreased     General Gait Details: slow speed and small shuffling steps. cues for upright posture and RW management to navigate around tight spaces with turns.   Stairs             Wheelchair Mobility    Modified Rankin (Stroke Patients Only)       Balance Overall balance assessment: Needs assistance Sitting-balance support: Feet supported, Single extremity supported Sitting balance-Leahy Scale: Fair     Standing balance support: During functional activity, Bilateral upper extremity supported, Reliant on assistive device for balance Standing balance-Leahy Scale: Poor Standing balance comment: reliant on support of therapist  Cognition Arousal/Alertness: Awake/alert Behavior During Therapy: WFL for tasks assessed/performed Overall Cognitive Status: Difficult to assess                                 General Comments: lethargic and increased time for processing/following  commands, and mobility initiation potentially due to increased lethargy this session.        Exercises      General Comments General comments (skin integrity, edema, etc.): Granddaughter present and able to interpret throughout session. Per family and RN, have been having difficulty with translation with use of virtual interpreters. granddaughter preferred to provide.      Pertinent Vitals/Pain Pain Assessment Pain Assessment: No/denies pain    Home Living                          Prior Function            PT Goals (current goals can now be found in the care plan section) Acute Rehab PT Goals Patient Stated Goal: get better after surgery and get home PT Goal Formulation: With patient Time For Goal Achievement: 04/13/22 Potential to Achieve Goals: Good Progress towards PT goals: Progressing toward goals    Frequency    Min 3X/week      PT Plan Current plan remains appropriate    Co-evaluation              AM-PAC PT "6 Clicks" Mobility   Outcome Measure  Help needed turning from your back to your side while in a flat bed without using bedrails?: A Little Help needed moving from lying on your back to sitting on the side of a flat bed without using bedrails?: A Little Help needed moving to and from a bed to a chair (including a wheelchair)?: A Little Help needed standing up from a chair using your arms (e.g., wheelchair or bedside chair)?: A Lot Help needed to walk in hospital room?: A Little Help needed climbing 3-5 steps with a railing? : A Lot 6 Click Score: 16    End of Session Equipment Utilized During Treatment: Gait belt Activity Tolerance: Patient tolerated treatment well Patient left: in chair;with call bell/phone within reach;with chair alarm set;with family/visitor present;with nursing/sitter in room Nurse Communication: Mobility status PT Visit Diagnosis: Unsteadiness on feet (R26.81);Muscle weakness (generalized) (M62.81);Difficulty  in walking, not elsewhere classified (R26.2)     Time: 8841-6606 PT Time Calculation (min) (ACUTE ONLY): 47 min  Charges:  $Therapeutic Activity: 38-52 mins                     Festus Barren PT, DPT  Acute Rehabilitation Services  Office [phone]  04/01/2022, 11:58 AM

## 2022-04-02 ENCOUNTER — Other Ambulatory Visit: Payer: Self-pay

## 2022-04-02 DIAGNOSIS — E8770 Fluid overload, unspecified: Secondary | ICD-10-CM

## 2022-04-02 DIAGNOSIS — N390 Urinary tract infection, site not specified: Secondary | ICD-10-CM | POA: Diagnosis not present

## 2022-04-02 DIAGNOSIS — E872 Acidosis, unspecified: Secondary | ICD-10-CM | POA: Diagnosis not present

## 2022-04-02 DIAGNOSIS — E876 Hypokalemia: Secondary | ICD-10-CM | POA: Diagnosis not present

## 2022-04-02 DIAGNOSIS — K8309 Other cholangitis: Secondary | ICD-10-CM | POA: Diagnosis not present

## 2022-04-02 LAB — MAGNESIUM: Magnesium: 1.9 mg/dL (ref 1.7–2.4)

## 2022-04-02 LAB — COMPREHENSIVE METABOLIC PANEL
ALT: 149 U/L — ABNORMAL HIGH (ref 0–44)
AST: 47 U/L — ABNORMAL HIGH (ref 15–41)
Albumin: 2.3 g/dL — ABNORMAL LOW (ref 3.5–5.0)
Alkaline Phosphatase: 106 U/L (ref 38–126)
Anion gap: 12 (ref 5–15)
BUN: 24 mg/dL — ABNORMAL HIGH (ref 8–23)
CO2: 27 mmol/L (ref 22–32)
Calcium: 7.1 mg/dL — ABNORMAL LOW (ref 8.9–10.3)
Chloride: 99 mmol/L (ref 98–111)
Creatinine, Ser: 0.83 mg/dL (ref 0.44–1.00)
GFR, Estimated: >60 mL/min (ref >60)
Glucose, Bld: 149 mg/dL — ABNORMAL HIGH (ref 70–99)
Potassium: 2.8 mmol/L — ABNORMAL LOW (ref 3.5–5.1)
Sodium: 138 mmol/L (ref 135–145)
Total Bilirubin: 1.2 mg/dL (ref 0.3–1.2)
Total Protein: 4.6 g/dL — ABNORMAL LOW (ref 6.5–8.1)

## 2022-04-02 LAB — CBC WITH DIFFERENTIAL/PLATELET
Abs Immature Granulocytes: 0.54 10*3/uL — ABNORMAL HIGH (ref 0.00–0.07)
Basophils Absolute: 0.1 10*3/uL (ref 0.0–0.1)
Basophils Relative: 0 %
Eosinophils Absolute: 0.1 10*3/uL (ref 0.0–0.5)
Eosinophils Relative: 1 %
HCT: 24.6 % — ABNORMAL LOW (ref 36.0–46.0)
Hemoglobin: 8.6 g/dL — ABNORMAL LOW (ref 12.0–15.0)
Immature Granulocytes: 4 %
Lymphocytes Relative: 13 %
Lymphs Abs: 1.9 10*3/uL (ref 0.7–4.0)
MCH: 30.7 pg (ref 26.0–34.0)
MCHC: 35 g/dL (ref 30.0–36.0)
MCV: 87.9 fL (ref 80.0–100.0)
Monocytes Absolute: 1 10*3/uL (ref 0.1–1.0)
Monocytes Relative: 7 %
Neutro Abs: 11.3 10*3/uL — ABNORMAL HIGH (ref 1.7–7.7)
Neutrophils Relative %: 75 %
Platelets: 91 10*3/uL — ABNORMAL LOW (ref 150–400)
RBC: 2.8 MIL/uL — ABNORMAL LOW (ref 3.87–5.11)
RDW: 15.6 % — ABNORMAL HIGH (ref 11.5–15.5)
WBC: 14.9 10*3/uL — ABNORMAL HIGH (ref 4.0–10.5)
nRBC: 0 % (ref 0.0–0.2)

## 2022-04-02 LAB — GLUCOSE, CAPILLARY
Glucose-Capillary: 139 mg/dL — ABNORMAL HIGH (ref 70–99)
Glucose-Capillary: 128 mg/dL — ABNORMAL HIGH (ref 70–99)
Glucose-Capillary: 146 mg/dL — ABNORMAL HIGH (ref 70–99)
Glucose-Capillary: 128 mg/dL — ABNORMAL HIGH (ref 70–99)

## 2022-04-02 LAB — VITAMIN D 25 HYDROXY (VIT D DEFICIENCY, FRACTURES): Vit D, 25-Hydroxy: 41.82 ng/mL (ref 30–100)

## 2022-04-02 MED ORDER — ALBUMIN HUMAN 25 % IV SOLN
25.0000 g | Freq: Two times a day (BID) | INTRAVENOUS | Status: AC
  Administered 2022-04-02 (×2): 25 g via INTRAVENOUS
  Filled 2022-04-02 (×2): qty 100

## 2022-04-02 MED ORDER — SODIUM CHLORIDE 0.9 % IV SOLN: INTRAVENOUS | Status: DC

## 2022-04-02 MED ORDER — POTASSIUM CHLORIDE CRYS ER 10 MEQ PO TBCR
40.0000 meq | EXTENDED_RELEASE_TABLET | ORAL | Status: AC
  Administered 2022-04-02 (×2): 40 meq via ORAL
  Filled 2022-04-02 (×2): qty 4

## 2022-04-02 MED ORDER — CALCIUM GLUCONATE-NACL 2-0.675 GM/100ML-% IV SOLN
2.0000 g | Freq: Once | INTRAVENOUS | Status: AC
  Administered 2022-04-02: 2000 mg via INTRAVENOUS
  Filled 2022-04-02: qty 100

## 2022-04-02 MED ORDER — AMOXICILLIN-POT CLAVULANATE 875-125 MG PO TABS
1.0000 | ORAL_TABLET | Freq: Two times a day (BID) | ORAL | Status: DC
  Administered 2022-04-02 – 2022-04-04 (×5): 1 via ORAL
  Filled 2022-04-02 (×5): qty 1

## 2022-04-02 MED ORDER — FUROSEMIDE 10 MG/ML IJ SOLN
20.0000 mg | Freq: Two times a day (BID) | INTRAMUSCULAR | Status: AC
  Administered 2022-04-02 – 2022-04-03 (×4): 20 mg via INTRAVENOUS
  Filled 2022-04-02 (×3): qty 2

## 2022-04-02 MED ORDER — FUROSEMIDE 10 MG/ML IJ SOLN
20.0000 mg | Freq: Two times a day (BID) | INTRAMUSCULAR | Status: DC
  Filled 2022-04-02: qty 2

## 2022-04-02 NOTE — Progress Notes (Signed)
2 Days Post-Op   Subjective/Chief Complaint: Had a better night last night. Did not eat much yesterday but tolerated ensure. No abdominal pain, nausea, emesis   Granddaughter bedside  Objective: Vital signs in last 24 hours: Temp:  [95.6 F (35.3 C)-98.1 F (36.7 C)] 97.5 F (36.4 C) (01/19 4818) Pulse Rate:  [64-74] 67 (01/19 0632) Resp:  [16-20] 20 (01/19 5631) BP: (129-149)/(55-65) 149/65 (01/19 4970) SpO2:  [97 %-99 %] 98 % (01/19 2637) Last BM Date : 03/30/22  Intake/Output from previous day: 01/18 0701 - 01/19 0700 In: 2544.3 [P.O.:340; I.V.:1977.4; IV Piggyback:226.9] Out: 801 [Urine:800; Stool:1] Intake/Output this shift: No intake/output data recorded.  PE:  Constitutional: No acute distress, conversant, appears stated age. CV: regular rate and rhythm GI: Soft, NT, incisions which are c/d/i Skin: No rashes, palpation reveals normal turgor    Lab Results:  Recent Labs    04/01/22 0528 04/02/22 0449  WBC 9.9 14.9*  HGB 8.4* 8.6*  HCT 24.8* 24.6*  PLT 71* 91*    BMET Recent Labs    04/01/22 0528 04/02/22 0449  NA 138 138  K 3.8 2.8*  CL 105 99  CO2 17* 27  GLUCOSE 163* 149*  BUN 23 24*  CREATININE 1.01* 0.83  CALCIUM 7.0* 7.1*    PT/INR No results for input(s): "LABPROT", "INR" in the last 72 hours. ABG No results for input(s): "PHART", "HCO3" in the last 72 hours.  Invalid input(s): "PCO2", "PO2"  Studies/Results: No results found.  Anti-infectives: Anti-infectives (From admission, onward)    Start     Dose/Rate Route Frequency Ordered Stop   03/29/22 2100  piperacillin-tazobactam (ZOSYN) IVPB 3.375 g  Status:  Discontinued        3.375 g 12.5 mL/hr over 240 Minutes Intravenous Every 8 hours 03/28/22 2051 03/29/22 0947   03/29/22 1000  piperacillin-tazobactam (ZOSYN) IVPB 3.375 g        3.375 g 12.5 mL/hr over 240 Minutes Intravenous Every 8 hours 03/29/22 0947     03/29/22 0800  cefTRIAXone (ROCEPHIN) 2 g in sodium chloride 0.9  % 100 mL IVPB  Status:  Discontinued        2 g 200 mL/hr over 30 Minutes Intravenous Every 24 hours 03/28/22 1058 03/28/22 1212   03/29/22 0800  cefTRIAXone (ROCEPHIN) 1 g in sodium chloride 0.9 % 100 mL IVPB  Status:  Discontinued        1 g 200 mL/hr over 30 Minutes Intravenous Every 24 hours 03/28/22 1212 03/28/22 1603   03/28/22 1700  piperacillin-tazobactam (ZOSYN) IVPB 3.375 g  Status:  Discontinued        3.375 g 12.5 mL/hr over 240 Minutes Intravenous Every 8 hours 03/28/22 1603 03/28/22 2051   03/28/22 1115  metroNIDAZOLE (FLAGYL) IVPB 500 mg  Status:  Discontinued        500 mg 100 mL/hr over 60 Minutes Intravenous 2 times daily 03/28/22 1058 03/28/22 1609   03/28/22 0645  cefTRIAXone (ROCEPHIN) 1 g in sodium chloride 0.9 % 100 mL IVPB        1 g 200 mL/hr over 30 Minutes Intravenous  Once 03/28/22 0644 03/28/22 0801       Assessment/Plan: 69F with cholangitis and choledocholithiasis - POD2 s/p laparoscopic partial cholecystectomy 1/17 AR - WBC 14.9 (afebrile) - LFTs improving. T bili normal - added tramadol for pain control (reaction of nausea per chart, antiemetics available and discussed with RN giving prior to pain med if needed) - tolerating FLD, advance soft but go  slowly, cont ensure - IS, PT/OT, mobilize  FEN: soft ID: zosyn (UTI) VTE: SCDs, hold LMWH for thrombocytopenia Foley: placed 1/14   LOS: 5 days    Winferd Humphrey, [facility] Surgery 04/02/2022, 8:38 AM Please see Amion for pager number during day hours 7:00am-4:30pm

## 2022-04-02 NOTE — Progress Notes (Signed)
OT Cancellation Note  Patient Details Name: MILLETTE HALBERSTAM MRN: [id number] DOB: 04-Nov-1940   Cancelled Treatment:    Reason Eval/Treat Not Completed: Other (comment). Per pt's nurse, the pt is reporting feelings of chest pain and shortness of breath. Further work-up, including EKG pending. Will hold for now and follow up as appropriate.     Leota Sauers, OTR/L 04/02/2022, 3:38 PM

## 2022-04-02 NOTE — Progress Notes (Signed)
PROGRESS NOTE    Lauren [name]  OTL:[id number] DOB: 03/03/1941 DOA: 03/27/2022 PCP: Jani Gravel, MD    Chief Complaint  Patient presents with   Abdominal Pain    Brief Narrative:  82 year old woman admitted to hospital with abdominal pain, several days of poor p.o. intake found to have cholangitis versus cholecystitis.   History largely per son at bedside.  Several days of poor appetite.  Not eating very much.  Abdominal pain worsened after eating bacon.  Presented to the ED.  They are ultrasound with dilated bile duct, sludge, gallbladder thickening.  MRCP subsequently obtained.  This demonstrated concern for cholangitis versus cholecystitis.  Surgery and GI were consulted.  Felt cholecystitis was less likely, most likely cholangitis.  Plan for ERCP 1/15 which was concerning for ascending cholangitis and acute cholecystitis.  Patient noted on the afternoon of 03/28/2022 to be, more hypotensive.  Not responding to fluids.  Transferred to the stepdown unit.  Patient required pressors.  Labs notable for creatinine doubled.  LFTs essentially stable with rising T. bili.  General surgery assessed patient as well.  Patient weaned off pressors transferred to the floor and transferred to [facility] service.  Patient for laparoscopic cholecystectomy 03/31/2022.   Assessment & Plan:   Principal Problem:   Ascending cholangitis Active Problems:   HTN (hypertension)   Hyperlipidemia   Type 2 diabetes mellitus (HCC)   GERD (gastroesophageal reflux disease)   Hypothyroidism   Nausea without vomiting   Chronic kidney disease, stage 2 (mild)   Iron deficiency anemia   Recurrent urinary tract infection   Vertigo   Hepatitis   Hypokalemia   LFT elevation   Shock (HCC)   Metabolic acidosis   Acute cholecystitis   Transaminitis   Anemia of chronic disease   Thrombocytopenia (HCC)   Septic shock (HCC)   Urinary tract infection without hematuria  #1 septic shock secondary to acute cholangitis and  probable acute cholecystitis,/transaminitis.  POA -Patient noted during the hospitalization to be persistently hypotensive despite IV fluid resuscitation requiring pressors. -CT angiogram abdomen and pelvis done with no evidence of aortic aneurysm or dissection.  Aortic atherosclerosis.  Old granulomatous disease.  Hepatic steatosis. -Right upper quadrant ultrasound done with small amount of gallbladder sludge however no definite gallstones identified.  Mild diffuse gallbladder gallbladder wall thickening which is nonspecific.  Mild dilatation of CBD measuring 10 mm containing sludge. -Workup done including MRCP with mild gallbladder wall thickening, pericholecystic edema highly suspicious for acute cholecystitis.  Mild dilatation of central intrahepatic bile ducts and CBD to the level of the ampulla.  T2 hypointense/seen throughout the common bile duct without definite calculi.  Cholangitis cannot be excluded.  Small several subscapular wedge-shaped signal abnormalities and there are peripheral right and left hepatic lobes noted nonspecific.  Mild hepatic steatosis.  Mild perihepatic ascites. -ERCP done by GI concerning for ascending cholangitis based upon dark bile and purulent debris.  Concern for cholecystitis as well. -Patient slowly improving clinically, currently on IV Zosyn. -Patient seen by general surgery and patient s/p laparoscopic partial cholecystectomy on 03/31/2022 purulent fluid noted inside gallbladder.  -IV fluids, pain management. -Patient tolerated full liquid diet and diet being advanced to soft diet per general surgery.   -Consult with ID for antibiotic duration and recommendations.  -Per general surgery and GI. -Mobilize.  2.  Metabolic acidosis -Likely second to acute infection. -Improved on bicarb drip. -Continue empiric IV antibiotics. -Discontinue bicarb drip.  3.  Anemia of chronic disease -Hemoglobin currently at 8.6,  likely dilutional, patient with no overt  bleeding. -Anemia panel consistent with anemia of chronic disease. -Follow H&H. -Transfusion hemoglobin < 7.  4.  Acute renal failure -Likely secondary to a prerenal azotemia and in the setting of ARB. -Urine output of 800 cc recorded over the past 24 hours.  -Renal function improving.  5.  Hypokalemia -Potassium at 2.8 this morning.   -Magnesium at 1.9.   -K-Dur 40 mEq p.o. every 4 hours x 2 doses.   -Repeat labs in the AM.   6.  Thrombocytopenia -Likely secondary to acute infection. -Patient with no overt bleeding. -Platelet count stable and slowly trending back up. -Follow. - liver function improving with hydration. -Patient now with concerns for volume overload and as such we will saline lock IV fluids.  7.  E. coli UTI -On IV Zosyn.  8.  Hypothyroidism -Continue Synthroid.   9.  Hypertension -Continue to hold home antihypertensive medications as patient had presented with septic shock.  Blood pressure improved.  -Patient to receive IV Lasix today.  - Follow BP.  10.  Diabetes mellitus type 2 -Last hemoglobin A1c 6.0 on 02/15/2020. -Repeat hemoglobin A1c 6.3 (04/01/2022). -CBG 139 this morning. -Continue to hold home oral hypoglycemic agents.   -SSI.   11.  Hypocalcemia -Corrected calcium of 8.46. -Status post calcium gluconate 1 g IV x 1. -Will give another dose of calcium gluconate x 1. -Vitamin D level at 41.82.   -Repeat labs in the AM.  12.  Volume overload -Patient noted to be volume overloaded on examination, decreased breath sounds in the bases, some scattered crackles, 1-2+ lower extremity edema, edema in hands. -Patient with complaints of some shortness of breath. -Patient noted to be +8 L during this hospitalization. -Placed on Lasix 20 mg IV every 12 hours x 4 doses, albumin 25 g IV every 12 hours x 2 doses. -Strict I's and O's, daily weights. -Saline lock IV fluids.    DVT prophylaxis: SCDs.  Postop DVT prophylaxis per general surgery. Code  Status: Full Family Communication: Updated patient and granddaughter at bedside. Disposition: Likely home with home health once medically improved and stable.  And cleared by GI and general surgery.  Status is: Inpatient Remains inpatient appropriate because: Severity of illness.   Consultants:  Gastroenterology: Dr. Benson Norway 03/28/2022 General surgery: Dr. Johney Maine 03/28/2022 PCCM: Dr. Silas Flood 03/28/2022 ID pending  Procedures:  ERCP with sphincterotomy/papillotomy per GI, Dr. Carlean Purl 03/29/2022 CT angiogram chest abdomen and pelvis 03/27/2022 MRCP 03/28/2022 Right upper quadrant ultrasound 03/28/2022 Laparoscopic partial cholecystectomy per Dr. Rosendo Gros, general surgery 03/31/2022  Significant Hospital Events: Including procedures, antibiotic start and stop dates in addition to other pertinent events   03/28/2021 admitted to the hospital after a week of poor p.o. intake, with abdominal pain, likely cholangitis versus cholecystitis, on levophed 1/16 Off levophed,  Antimicrobials:  IV Rocephin 1/14/ 2024 x 1 dose IV Zosyn 03/28/2022>>>>>>>   Subjective: Patient sitting up in chair.  Granddaughter at bedside.  Patient with some complaints of shortness of breath.  Patient also with complaints of swelling in her hands.  Denies any chest pain.  Denies any significant abdominal pain.   Objective: Vitals:   04/01/22 1155 04/01/22 1649 04/01/22 2202 04/02/22 0632  BP: 138/63 (!) 129/56 (!) 142/55 (!) 149/65  Pulse: 74 66 64 67  Resp: '18 18 16 20  '$ Temp: (!) 97.5 F (36.4 C) (!) 95.6 F (35.3 C) 98.1 F (36.7 C) (!) 97.5 F (36.4 C)  TempSrc:   Oral Oral  SpO2: 99% 99% 97% 98%    Intake/Output Summary (Last 24 hours) at 04/02/2022 1128 Last data filed at 04/02/2022 0600 Gross per 24 hour  Intake 2544.3 ml  Output 801 ml  Net 1743.3 ml    There were no vitals filed for this visit.  Examination:  General exam: NAD. Respiratory system: Some decreased breath sounds in the bases.   Scattered crackles.  No wheezing, no rhonchi.  Fair air movement.  Speaking in full sentences.  Normal respiratory effort.  Cardiovascular system: RRR no murmurs rubs or gallops.  No JVD.  2+ bilateral lower extremity edema. Gastrointestinal system: Abdomen mildly distended, soft, no significant tenderness to palpation.  Positive bowel sounds.  Some diffuse swelling noted.  No rebound.  No guarding.  Incision sites c/d/I.  Central nervous system: Alert and oriented. No focal neurological deficits. Extremities: Symmetric 5 x 5 power. Skin: No rashes, lesions or ulcers Psychiatry: Judgement and insight appear normal. Mood & affect appropriate.     Data Reviewed: I have personally reviewed following labs and imaging studies  CBC: Recent Labs  Lab 03/30/22 0500 03/30/22 1534 03/31/22 0500 04/01/22 0528 04/02/22 0449  WBC 12.9* 15.3* 15.2* 9.9 14.9*  NEUTROABS  --   --  13.3* 8.0* 11.3*  HGB 8.7* 9.0* 8.3* 8.4* 8.6*  HCT 25.6* 25.9* 24.3* 24.8* 24.6*  MCV 89.8 89.6 91.4 91.2 87.9  PLT 50* 57* 59* 71* 91*     Basic Metabolic Panel: Recent Labs  Lab 03/28/22 1129 03/29/22 0325 03/30/22 0500 03/31/22 0500 04/01/22 0528 04/02/22 0449  NA 133* 134* 136 138 138 138  K 2.9* 4.0 3.4* 3.9 3.8 2.8*  CL 98 104 111 113* 105 99  CO2 20* 18* 16* 16* 17* 27  GLUCOSE 197* 140* 116* 98 163* 149*  BUN 25* 33* 27* 20 23 24*  CREATININE 1.00 1.15* 1.15* 1.01* 1.01* 0.83  CALCIUM 8.3* 7.9* 7.0* 7.2* 7.0* 7.1*  MG 1.9  --  1.8 2.4  --  1.9  PHOS 3.8  --   --   --   --   --      GFR: CrCl cannot be calculated (Unknown ideal weight.).  Liver Function Tests: Recent Labs  Lab 03/29/22 0325 03/30/22 0500 03/31/22 0500 04/01/22 0528 04/02/22 0449  AST 649* 258* 126* 73* 47*  ALT 582* 350* 267* 193* 149*  ALKPHOS 114 100 109 95 106  BILITOT 4.4* 2.8* 2.2* 2.3* 1.2  PROT 6.0* 4.5* 4.7* 4.5* 4.6*  ALBUMIN 3.7 2.8* 2.4* 2.1* 2.3*     CBG: Recent Labs  Lab 04/01/22 0736  04/01/22 1151 04/01/22 1644 04/01/22 2155 04/02/22 0645  GLUCAP 169* 149* 159* 147* 139*      Recent Results (from the past 240 hour(s))  Urine Culture     Status: Abnormal   Collection Time: 03/27/22  7:45 PM   Specimen: Urine, Clean Catch  Result Value Ref Range Status   Specimen Description   Final    URINE, CLEAN CATCH Performed at [facility], [address] [place], [place] [address]    Special Requests   Final    NONE Performed at [facility], [address] [place], [place] [address]    Culture >=100,000 COLONIES/mL ESCHERICHIA COLI (A)  Final   Report Status 03/30/2022 FINAL  Final   Organism ID, Bacteria ESCHERICHIA COLI (A)  Final      Susceptibility   Escherichia coli - MIC*    AMPICILLIN <=2  SENSITIVE Sensitive     CEFAZOLIN <=4 SENSITIVE Sensitive     CEFEPIME <=0.12 SENSITIVE Sensitive     CEFTRIAXONE <=0.25 SENSITIVE Sensitive     CIPROFLOXACIN <=0.25 SENSITIVE Sensitive     GENTAMICIN <=1 SENSITIVE Sensitive     IMIPENEM <=0.25 SENSITIVE Sensitive     NITROFURANTOIN <=16 SENSITIVE Sensitive     TRIMETH/SULFA <=20 SENSITIVE Sensitive     AMPICILLIN/SULBACTAM <=2 SENSITIVE Sensitive     PIP/TAZO <=4 SENSITIVE Sensitive     * >=100,000 COLONIES/mL ESCHERICHIA COLI  MRSA Next Gen by PCR, Nasal     Status: None   Collection Time: 03/28/22  9:39 PM   Specimen: Nasal Mucosa; Nasal Swab  Result Value Ref Range Status   MRSA by PCR Next Gen NOT DETECTED NOT DETECTED Final    Comment: (NOTE) The GeneXpert MRSA Assay (FDA approved for NASAL specimens only), is one component of a comprehensive MRSA colonization surveillance program. It is not intended to diagnose MRSA infection nor to guide or monitor treatment for MRSA infections. Test performance is not FDA approved in patients less than 43 years old. Performed at [facility], [place] [address], [place], [place] [address]          Radiology Studies: No  results found.      Scheduled Meds:  acetaminophen  1,000 mg Oral Q6H   Chlorhexidine Gluconate Cloth  6 each Topical Q2200   insulin aspart  0-9 Units Subcutaneous TID WC   levothyroxine  75 mcg Oral Q0600   lip balm   Topical BID   polyethylene glycol  17 g Oral Daily   potassium chloride  40 mEq Oral Q4H   sodium chloride flush  10-40 mL Intracatheter Q12H   Continuous Infusions:  sodium chloride Stopped (03/29/22 1113)   sodium chloride     calcium gluconate     famotidine (PEPCID) IV 20 mg (04/01/22 2150)   methocarbamol (ROBAXIN) IV     ondansetron (ZOFRAN) IV     piperacillin-tazobactam (ZOSYN)  IV 3.375 g (04/02/22 1108)     LOS: 5 days    Time spent: 40 minutes    Irine Seal, MD [facility]   To contact the attending provider between 7A-7P or the covering provider during after hours 7P-7A, please log into the web site [email or web address] and access using universal [place] password for that web site. If you do not have the password, please call the hospital operator.  04/02/2022, 11:28 AM

## 2022-04-02 NOTE — Consult Note (Signed)
[name]    Date of Admission:  03/27/2022   Total days of inpatient antibiotics 5        Reason for Consult: Ascending cholangitis    Principal Problem:   Ascending cholangitis Active Problems:   HTN (hypertension)   Hyperlipidemia   Type 2 diabetes mellitus (HCC)   GERD (gastroesophageal reflux disease)   Hypothyroidism   Nausea without vomiting   Chronic kidney disease, stage 2 (mild)   Iron deficiency anemia   Recurrent urinary tract infection   Vertigo   Hepatitis   Hypokalemia   LFT elevation   Shock (HCC)   Metabolic acidosis   Acute cholecystitis   Transaminitis   Anemia of chronic disease   Thrombocytopenia (HCC)   Septic shock (HCC)   Urinary tract infection without hematuria   Assessment: 69 YF with Hx of recurrent UTI, DM2 admitted with elevated LFTs, found to have ascending cholangitis.  #Ascending cholangitis status post ERCP with sphincterotomy  on 1/15 followed by lap chole on 1/17 #Elevated LFTs-trending down #Cipro intolerance #Disoriented - MRCP showed mild gallbladder thickening and pericholecystic edema highly suspicious for acute cholecystitis - GI engaged patient underwent ERCP findings notable ascending cholangitis based on dark bile and purulent debris.Pt underwent moderate biliary sphincterectomy performed.  No cultures obtained.   - General surgery engaged and patient underwent lap chole on 1/17 -ID engaged for antibiotic recommendations. Acute hep panel on 1/13 NR. Noted that she has a cipro allergy, she is not oriented to provide adequate Hx. Given that I did not find evidence of past pseudomonal infection and Ecoli(Urine Cx) is pan sens, can transition to Augmentin.  Recommendations:  -D/C pip tazo -Start Augmentin to complete 2 weeks of antibiotics from Lap chol EOT 1/30.  -F/U with ID on 1/30  #Urine Cx+ Ecoli -pan sens, covered by abx as above  #DM2 -A1c 6.3 on 1/18   ID will sign  off  I have personally spent 95 minutes involved in face-to-face and non-face-to-face activities for this patient on the day of the visit. Professional time spent includes the following activities: Preparing to see the patient (review of tests), Obtaining and/or reviewing separately obtained history (admission/discharge record), Performing a medically appropriate examination and/or evaluation , Ordering medications/tests/procedures, referring and communicating with other health care professionals, Documenting clinical information in the EMR, Independently interpreting results (not separately reported), Communicating results to the patient/family/caregiver, Counseling and educating the patient/family/caregiver and Care coordination (not separately reported).   Microbiology:   Antibiotics: 1/14 ctx 1/14 pip-tazo-  Cultures: Blood  Urine 1/13 Ecoli   HPI: Lauren [name] is a 82 y.o. female with past medical history of seasonal allergies, aortic arthrosclerosis, hyperlipidemia, stroke osteoarthritis, diabetes type 2, diverticulosis, dyspepsia, gastritis, GERD, hiatal hernia, history of hemolytic anemia, hemorrhoids, hypertension, hypothyroidism, iron overload, microcytic anemia, osteoporosis, UTI, vitamin D deficiency admitted with hepatitis.  Patient presented to the ED with epigastric abdominal pain started earlier in the afternoon after she ate chicken and rice.  On arrival to the ED patient had temp of 97.9, WBC 5.2 AST/ALT/ALP 75170, T. bili 1.3./546/194.  CTA did not show aortic aneurysm, did show hepatic steatosis, old granulomatous disease and spleen.  Right upper quadrant ultrasound showed gallbladder sludge, diffuse gallbladder thickening, cholecystitis cannot be ruled out.  Mild dilatation of common bile duct recommended MRI MRCP. In the interim patient started on pip-tazo.  MRCP showed mild gallbladder thickening and pericholecystic edema highly suspicious for acute  cholecystitis.  Patient  underwent ERCP with GI on 1/15, findings significant for copious dark bile  and some sludge with mucopurulent debris, moderate biliary sphincterotomy performed.  No cultures obtained.  Patient taken to the OR for lap chole on 1/17.  LFTs started trending down.  ID engaged for antibiotic recommendations. Review of Systems: Review of Systems  All other systems reviewed and are negative.   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   Osteoporosis    UTI (lower urinary tract infection)    Vitamin D deficiency     Social History   Tobacco Use   Smoking status: Never   Smokeless tobacco: Never  Substance Use Topics   Alcohol use: No   Drug use: No    Family History  Problem Relation Age of Onset   Colon cancer Neg Hx    Scheduled Meds:  acetaminophen  1,000 mg Oral Q6H   Chlorhexidine Gluconate Cloth  6 each Topical Q2200   insulin aspart  0-9 Units Subcutaneous TID WC   levothyroxine  75 mcg Oral Q0600   lip balm   Topical BID   polyethylene glycol  17 g Oral Daily   potassium chloride  40 mEq Oral Q4H   sodium chloride flush  10-40 mL Intracatheter Q12H   Continuous Infusions:  sodium chloride Stopped (03/29/22 1113)   sodium chloride     famotidine (PEPCID) IV 20 mg (04/01/22 2150)   methocarbamol (ROBAXIN) IV     ondansetron (ZOFRAN) IV     piperacillin-tazobactam (ZOSYN)  IV 3.375 g (04/02/22 0135)   PRN Meds:.alum & mag hydroxide-simeth, bisacodyl, clonazepam, fentaNYL (SUBLIMAZE)  injection, iohexol, magic mouthwash, menthol-cetylpyridinium, methocarbamol (ROBAXIN) IV, naloxone, OLANZapine zydis, ondansetron (ZOFRAN) IV **OR** ondansetron (ZOFRAN) IV, mouth rinse, phenol, prochlorperazine, traMADol Allergies  Allergen Reactions   Pyridium [Phenazopyridine Hcl] Other (See Comments)    Hemolytic anemia   Sulfa Antibiotics     Potential allergy: oxidizing drug: methemoglobinemia/hemolysis on pyridium   Ciprofloxacin Hcl     Sx's almost like a heart attack   Esomeprazole Magnesium Nausea Only    Pain and nausea   Macrodantin [Nitrofurantoin Macrocrystal] Other (See Comments)    GI Upset   Pantoprazole Sodium Nausea Only    abd pain and nausea   Tramadol Nausea Only    OBJECTIVE: Blood pressure (!) 149/65, pulse 67, temperature (!) 97.5 F (36.4 C), temperature source Oral, resp. rate 20, SpO2 98 %.  Physical Exam Constitutional:      Appearance: Normal appearance.  HENT:     Head: Normocephalic and atraumatic.     Right Ear: Tympanic membrane normal.     Left Ear: Tympanic membrane normal.     Nose: Nose normal.     Mouth/Throat:     Mouth: Mucous membranes are moist.  Eyes:     Extraocular Movements: Extraocular movements intact.  Conjunctiva/sclera: Conjunctivae normal.     Pupils: Pupils are equal, round, and reactive to light.  Cardiovascular:     Rate and Rhythm: Normal rate and regular rhythm.     Heart sounds: No murmur heard.    No friction rub. No gallop.  Pulmonary:     Effort: Pulmonary effort is normal.     Breath sounds: Normal breath sounds.  Abdominal:     General: Abdomen is flat.     Comments: Mid abdominal wound   Musculoskeletal:        General: Normal range of motion.  Skin:    General: Skin is warm and dry.  Neurological:     General: No focal deficit present.     Mental Status: She is alert and oriented to person, place, and time.  Psychiatric:        Mood and Affect: Mood normal.     Lab Results Lab Results   Component Value Date   WBC 14.9 (H) 04/02/2022   HGB 8.6 (L) 04/02/2022   HCT 24.6 (L) 04/02/2022   MCV 87.9 04/02/2022   PLT 91 (L) 04/02/2022    Lab Results  Component Value Date   CREATININE 0.83 04/02/2022   BUN 24 (H) 04/02/2022   NA 138 04/02/2022   K 2.8 (L) 04/02/2022   CL 99 04/02/2022   CO2 27 04/02/2022    Lab Results  Component Value Date   ALT 149 (H) 04/02/2022   AST 47 (H) 04/02/2022   GGT 415 (H) 03/27/2022   ALKPHOS 106 04/02/2022   BILITOT 1.2 04/02/2022       Laurice Record, [facility] 04/02/2022, 8:33 AM

## 2022-04-03 DIAGNOSIS — K219 Gastro-esophageal reflux disease without esophagitis: Secondary | ICD-10-CM

## 2022-04-03 DIAGNOSIS — D509 Iron deficiency anemia, unspecified: Secondary | ICD-10-CM

## 2022-04-03 DIAGNOSIS — E039 Hypothyroidism, unspecified: Secondary | ICD-10-CM

## 2022-04-03 DIAGNOSIS — N182 Chronic kidney disease, stage 2 (mild): Secondary | ICD-10-CM

## 2022-04-03 DIAGNOSIS — K8309 Other cholangitis: Secondary | ICD-10-CM | POA: Diagnosis not present

## 2022-04-03 DIAGNOSIS — N390 Urinary tract infection, site not specified: Secondary | ICD-10-CM | POA: Diagnosis not present

## 2022-04-03 DIAGNOSIS — K810 Acute cholecystitis: Secondary | ICD-10-CM | POA: Diagnosis not present

## 2022-04-03 LAB — CBC WITH DIFFERENTIAL/PLATELET
Abs Immature Granulocytes: 0.6 10*3/uL — ABNORMAL HIGH (ref 0.00–0.07)
Basophils Absolute: 0.1 10*3/uL (ref 0.0–0.1)
Basophils Relative: 0 %
Eosinophils Absolute: 0.1 10*3/uL (ref 0.0–0.5)
Eosinophils Relative: 1 %
HCT: 25 % — ABNORMAL LOW (ref 36.0–46.0)
Hemoglobin: 8.9 g/dL — ABNORMAL LOW (ref 12.0–15.0)
Immature Granulocytes: 4 %
Lymphocytes Relative: 12 %
Lymphs Abs: 1.9 10*3/uL (ref 0.7–4.0)
MCH: 30.6 pg (ref 26.0–34.0)
MCHC: 35.6 g/dL (ref 30.0–36.0)
MCV: 85.9 fL (ref 80.0–100.0)
Monocytes Absolute: 0.8 10*3/uL (ref 0.1–1.0)
Monocytes Relative: 5 %
Neutro Abs: 11.9 10*3/uL — ABNORMAL HIGH (ref 1.7–7.7)
Neutrophils Relative %: 78 %
Platelets: 76 10*3/uL — ABNORMAL LOW (ref 150–400)
RBC: 2.91 MIL/uL — ABNORMAL LOW (ref 3.87–5.11)
RDW: 15.6 % — ABNORMAL HIGH (ref 11.5–15.5)
WBC: 15.3 10*3/uL — ABNORMAL HIGH (ref 4.0–10.5)
nRBC: 0 % (ref 0.0–0.2)

## 2022-04-03 LAB — COMPREHENSIVE METABOLIC PANEL
ALT: 169 U/L — ABNORMAL HIGH (ref 0–44)
AST: 144 U/L — ABNORMAL HIGH (ref 15–41)
Albumin: 3.5 g/dL (ref 3.5–5.0)
Alkaline Phosphatase: 94 U/L (ref 38–126)
Anion gap: 12 (ref 5–15)
BUN: 15 mg/dL (ref 8–23)
CO2: 29 mmol/L (ref 22–32)
Calcium: 7.9 mg/dL — ABNORMAL LOW (ref 8.9–10.3)
Chloride: 95 mmol/L — ABNORMAL LOW (ref 98–111)
Creatinine, Ser: 0.78 mg/dL (ref 0.44–1.00)
GFR, Estimated: >60 mL/min (ref >60)
Glucose, Bld: 128 mg/dL — ABNORMAL HIGH (ref 70–99)
Potassium: 2.5 mmol/L — CL (ref 3.5–5.1)
Sodium: 136 mmol/L (ref 135–145)
Total Bilirubin: 2.5 mg/dL — ABNORMAL HIGH (ref 0.3–1.2)
Total Protein: 5.1 g/dL — ABNORMAL LOW (ref 6.5–8.1)

## 2022-04-03 LAB — GLUCOSE, CAPILLARY
Glucose-Capillary: 142 mg/dL — ABNORMAL HIGH (ref 70–99)
Glucose-Capillary: 124 mg/dL — ABNORMAL HIGH (ref 70–99)
Glucose-Capillary: 166 mg/dL — ABNORMAL HIGH (ref 70–99)
Glucose-Capillary: 120 mg/dL — ABNORMAL HIGH (ref 70–99)

## 2022-04-03 LAB — MAGNESIUM: Magnesium: 1.5 mg/dL — ABNORMAL LOW (ref 1.7–2.4)

## 2022-04-03 MED ORDER — MAGNESIUM SULFATE 4 GM/100ML IV SOLN
4.0000 g | Freq: Once | INTRAVENOUS | Status: AC
  Administered 2022-04-03: 4 g via INTRAVENOUS
  Filled 2022-04-03: qty 100

## 2022-04-03 MED ORDER — POTASSIUM CHLORIDE CRYS ER 10 MEQ PO TBCR: 40.0000 meq | EXTENDED_RELEASE_TABLET | ORAL | Status: DC

## 2022-04-03 MED ORDER — POTASSIUM CHLORIDE CRYS ER 10 MEQ PO TBCR
40.0000 meq | EXTENDED_RELEASE_TABLET | ORAL | Status: AC
  Administered 2022-04-03 – 2022-04-04 (×3): 40 meq via ORAL
  Filled 2022-04-03 (×3): qty 4

## 2022-04-03 NOTE — Evaluation (Signed)
Occupational Therapy Evaluation Patient Details Name: Lauren [name] MRN: [id number] DOB: [birth year] Today's Date: 04/03/2022   History of Present Illness Patient is 82 y.o. female admitted to hospital with abdominal pain, several days of poor p.o. intake found to have cholangitis versus cholecystitis. Pt s/p ERCP on 03/29/22 with plan for lap chole on 03/31/22. PMH significant for OA, DM, GERD, HLD, HTN, hypothyroidism, osteoporosis, Lt [facility] in 2021.   Clinical Impression   Patient is a 82 year old female who was admitted for above. Patients granddaughter was present and patient preferred to use her as Optometrist. Patient was living at home alone prior level with independence in ADLs.  Currently, patient is max A for hygiene and clothing management. Patient was min A for transfers and functional mobility with RW in room and through doorway into hallway. Patient was noted to have decreased functional activity tolerance, decreased endurance, decreased standing balance, decreased safety awareness, and decreased knowledge of AD/AE impacting participation in ADLs. Family plans to transition with patient back home if patient is able to improve functional activity tolerance. Patient would continue to benefit from skilled OT services at this time while admitted and after d/c to address noted deficits in order to improve overall safety and independence in ADLs.       Recommendations for follow up therapy are one component of a multi-disciplinary discharge planning process, led by the attending physician.  Recommendations may be updated based on patient status, additional functional criteria and insurance authorization.   Follow Up Recommendations  Skilled nursing-short term rehab (<3 hours/day)     Assistance Recommended at Discharge Frequent or constant Supervision/Assistance  Patient can return home with the following A lot of help with bathing/dressing/bathroom;A lot of help with walking and/or  transfers;Assistance with cooking/housework;Direct supervision/assist for medications management;Assist for transportation;Help with stairs or ramp for entrance;Direct supervision/assist for financial management;Assistance with feeding    Functional Status Assessment  Patient has had a recent decline in their functional status and demonstrates the ability to make significant improvements in function in a reasonable and predictable amount of time.  Equipment Recommendations  BSC/3in1;Wheelchair cushion (measurements OT);Wheelchair (measurements OT)    Recommendations for Other Services       Precautions / Restrictions Precautions Precautions: Fall Precaution Comments: CVC, IV, foley Restrictions Weight Bearing Restrictions: No      Mobility Bed Mobility               General bed mobility comments: patient in reclinter at start of session and returned to the same. patient prefers recliner v.s. hospital bed.        Balance Overall balance assessment: Needs assistance Sitting-balance support: Feet supported, Single extremity supported Sitting balance-Leahy Scale: Fair     Standing balance support: During functional activity, Bilateral upper extremity supported, Reliant on assistive device for balance Standing balance-Leahy Scale: Poor Standing balance comment: reliant on support of therapist                           ADL either performed or assessed with clinical judgement   ADL Overall ADL's : Needs assistance/impaired   Eating/Feeding Details (indicate cue type and reason): unknown patient declined to eat or drink while therapist is present. patient was educated on importance of eating with granddaughter as Optometrist. Grooming: Minimal assistance;Sitting   Upper Body Bathing: Moderate assistance;Sitting;Bed level   Lower Body Bathing: Maximal assistance;Bed level   Upper Body Dressing : Maximal assistance;Sitting   Lower Body  Dressing: Total  assistance;Bed level   Toilet Transfer: Minimal assistance;Rolling walker (2 wheels);Ambulation Toilet Transfer Details (indicate cue type and reason): to transfer to [facility] with increased time. patient was able to take some steps from [facility] to wall in room and then out into hallway about 20 feet over all with RW. patient wanted to keep moving once up after toileting attempt. patient HR noted to increase to 120bpm. Toileting- Clothing Manipulation and Hygiene: Total assistance;Sit to/from stand               Vision   Vision Assessment?: No apparent visual deficits            Pertinent Vitals/Pain Pain Assessment Pain Assessment: Faces Faces Pain Scale: No hurt Pain Location: just feels weak        Extremity/Trunk Assessment Upper Extremity Assessment Upper Extremity Assessment: Generalized weakness (limited ROM assessment with IVs)   Lower Extremity Assessment Lower Extremity Assessment: Defer to PT evaluation   Cervical / Trunk Assessment Cervical / Trunk Assessment: Normal   Communication Communication Communication: Prefers language other than [place];Interpreter utilized   Cognition Arousal/Alertness: Awake/alert Behavior During Therapy: WFL for tasks assessed/performed Overall Cognitive Status: Difficult to assess                      Home Living Family/patient expects to be discharged to:: Private residence Living Arrangements: Alone Available Help at Discharge: Family;Available 24 hours/day Type of Home: House Home Access: Stairs to enter [employer] of Steps: 3   Home Layout: One level     Bathroom Shower/Tub: Tub/shower unit         Home Equipment: Conservation officer, nature (2 wheels);Cane - single point          Prior Functioning/Environment Prior Level of Function : Independent/Modified Independent;Driving              OT Problem List: Decreased activity tolerance;Impaired balance (sitting and/or standing);Decreased  coordination;Decreased safety awareness;Decreased knowledge of precautions;Decreased knowledge of use of DME or AE;Cardiopulmonary status limiting activity      OT Treatment/Interventions: Self-care/ADL training;Energy conservation;Therapeutic exercise;DME and/or AE instruction;Therapeutic activities;Patient/family education;Balance training    OT Goals(Current goals can be found in the care plan section) Acute Rehab OT Goals Patient Stated Goal: to walk OT Goal Formulation: With family Time For Goal Achievement: 04/17/22 Potential to Achieve Goals: Fair  OT Frequency: Min 2X/week       AM-PAC OT "6 Clicks" Daily Activity     Outcome Measure Help from another person eating meals?: A Little Help from another person taking care of personal grooming?: A Little Help from another person toileting, which includes using toliet, bedpan, or urinal?: A Lot Help from another person bathing (including washing, rinsing, drying)?: A Lot Help from another person to put on and taking off regular upper body clothing?: A Lot Help from another person to put on and taking off regular lower body clothing?: A Lot 6 Click Score: 14   End of Session Equipment Utilized During Treatment: Gait belt;Rolling walker (2 wheels) Nurse Communication: Other (comment) (ok to participate in session)  Activity Tolerance: Patient tolerated treatment well;Patient limited by fatigue Patient left: in chair;with call bell/phone within reach;with chair alarm set;with family/visitor present  OT Visit Diagnosis: Unsteadiness on feet (R26.81);Other abnormalities of gait and mobility (R26.89);Muscle weakness (generalized) (M62.81)                Time: 5053-9767 OT Time Calculation (min): 37 min Charges:  OT General Charges $OT Visit:  1 Visit OT Evaluation $OT Eval Moderate Complexity: 1 Mod OT Treatments $Self Care/Home Management : 8-22 mins  Rennie Plowman, MS Acute Rehabilitation Department Office#  [phone]   Willa Rough 04/03/2022, 4:42 PM

## 2022-04-03 NOTE — Progress Notes (Signed)
   04/03/22 1423  Provider Notification  Provider Name/Title Grandville Silos  Date Provider Notified 04/03/22  Time Provider Notified [id number]  Method of Notification Page  Notification Reason Critical Result  Test performed and critical result K 2.5  Date Critical Result Received 04/03/22  Time Critical Result Received 1422  Provider response Other (Comment) (pending)

## 2022-04-03 NOTE — Plan of Care (Signed)
  Problem: Clinical Measurements: Goal: Respiratory complications will improve Outcome: Progressing

## 2022-04-03 NOTE — Progress Notes (Addendum)
3 Days Post-Op   Subjective/Chief Complaint: Daughter at bedside  Translates to pt per request  Feels ok appetite poor  Pain controlled better    Objective: Vital signs in last 24 hours: Temp:  [97.6 F (36.4 C)-98.6 F (37 C)] 98.6 F (37 C) (01/20 1128) Pulse Rate:  [68-72] 72 (01/20 1128) Resp:  [14-20] 20 (01/20 1128) BP: (155-161)/(60-68) 157/63 (01/20 1128) SpO2:  [96 %-98 %] 97 % (01/20 1128) Last BM Date : 04/02/22  Intake/Output from previous day: 01/19 0701 - 01/20 0700 In: 693.2 [P.O.:450; IV Piggyback:243.2] Out: 4950 [Urine:4950] Intake/Output this shift: No intake/output data recorded.   Constitutional: No acute distress, conversant, appears stated age. CV: regular rate and rhythm GI: Soft, NT, incisions which are c/d/i Skin: No rashes, palpation reveals normal turgor Lab Results:  Recent Labs    04/02/22 0449 04/03/22 0445  WBC 14.9* 15.3*  HGB 8.6* 8.9*  HCT 24.6* 25.0*  PLT 91* 76*   BMET Recent Labs    04/01/22 0528 04/02/22 0449  NA 138 138  K 3.8 2.8*  CL 105 99  CO2 17* 27  GLUCOSE 163* 149*  BUN 23 24*  CREATININE 1.01* 0.83  CALCIUM 7.0* 7.1*   PT/INR No results for input(s): "LABPROT", "INR" in the last 72 hours. ABG No results for input(s): "PHART", "HCO3" in the last 72 hours.  Invalid input(s): "PCO2", "PO2"  Studies/Results: No results found.  Anti-infectives: Anti-infectives (From admission, onward)    Start     Dose/Rate Route Frequency Ordered Stop   04/02/22 2000  amoxicillin-clavulanate (AUGMENTIN) 875-125 MG per tablet 1 tablet        1 tablet Oral Every 12 hours 04/02/22 1202 04/14/22 2159   03/29/22 2100  piperacillin-tazobactam (ZOSYN) IVPB 3.375 g  Status:  Discontinued        3.375 g 12.5 mL/hr over 240 Minutes Intravenous Every 8 hours 03/28/22 2051 03/29/22 0947   03/29/22 1000  piperacillin-tazobactam (ZOSYN) IVPB 3.375 g  Status:  Discontinued        3.375 g 12.5 mL/hr over 240 Minutes Intravenous  Every 8 hours 03/29/22 0947 04/02/22 1202   03/29/22 0800  cefTRIAXone (ROCEPHIN) 2 g in sodium chloride 0.9 % 100 mL IVPB  Status:  Discontinued        2 g 200 mL/hr over 30 Minutes Intravenous Every 24 hours 03/28/22 1058 03/28/22 1212   03/29/22 0800  cefTRIAXone (ROCEPHIN) 1 g in sodium chloride 0.9 % 100 mL IVPB  Status:  Discontinued        1 g 200 mL/hr over 30 Minutes Intravenous Every 24 hours 03/28/22 1212 03/28/22 1603   03/28/22 1700  piperacillin-tazobactam (ZOSYN) IVPB 3.375 g  Status:  Discontinued        3.375 g 12.5 mL/hr over 240 Minutes Intravenous Every 8 hours 03/28/22 1603 03/28/22 2051   03/28/22 1115  metroNIDAZOLE (FLAGYL) IVPB 500 mg  Status:  Discontinued        500 mg 100 mL/hr over 60 Minutes Intravenous 2 times daily 03/28/22 1058 03/28/22 1609   03/28/22 0645  cefTRIAXone (ROCEPHIN) 1 g in sodium chloride 0.9 % 100 mL IVPB        1 g 200 mL/hr over 30 Minutes Intravenous  Once 03/28/22 0644 03/28/22 0801       Assessment/Plan: s/p Procedure(s): LAPAROSCOPIC CHOLECYSTECTOMY (N/A)   32F with cholangitis and choledocholithiasis - POD 3 s/p laparoscopic partial cholecystectomy 1/17 AR - WBC 15  (afebrile) follow for now - recheck  in am  - LFTs improving. T bili normal - added tramadol for pain control (reaction of nausea per chart, antiemetics available and discussed with RN giving prior to pain med if needed) - tolerating FLD, advance soft but go slowly, cont ensure - IS, PT/OT, mobilize   FEN: soft ID: zosyn (UTI) VTE: SCDs, hold LMWH for thrombocytopenia Foley: placed 1/14     LOS: 6 days    Marcello Moores A Joh Rao 04/03/2022

## 2022-04-03 NOTE — Progress Notes (Signed)
PROGRESS NOTE    Lauren [name]  ZOX:[id number] DOB: 1940/08/23 DOA: 03/27/2022 PCP: Jani Gravel, MD    Chief Complaint  Patient presents with   Abdominal Pain    Brief Narrative:  82 year old woman admitted to hospital with abdominal pain, several days of poor p.o. intake found to have cholangitis versus cholecystitis.   History largely per son at bedside.  Several days of poor appetite.  Not eating very much.  Abdominal pain worsened after eating bacon.  Presented to the ED.  They are ultrasound with dilated bile duct, sludge, gallbladder thickening.  MRCP subsequently obtained.  This demonstrated concern for cholangitis versus cholecystitis.  Surgery and GI were consulted.  Felt cholecystitis was less likely, most likely cholangitis.  Plan for ERCP 1/15 which was concerning for ascending cholangitis and acute cholecystitis.  Patient noted on the afternoon of 03/28/2022 to be, more hypotensive.  Not responding to fluids.  Transferred to the stepdown unit.  Patient required pressors.  Labs notable for creatinine doubled.  LFTs essentially stable with rising T. bili.  General surgery assessed patient as well.  Patient weaned off pressors transferred to the floor and transferred to [facility] service.  Patient for laparoscopic cholecystectomy 03/31/2022.   Assessment & Plan:   Principal Problem:   Ascending cholangitis Active Problems:   HTN (hypertension)   Hyperlipidemia   Type 2 diabetes mellitus (HCC)   GERD (gastroesophageal reflux disease)   Hypothyroidism   Nausea without vomiting   Chronic kidney disease, stage 2 (mild)   Iron deficiency anemia   Recurrent urinary tract infection   Vertigo   Hepatitis   Hypokalemia   LFT elevation   Shock (HCC)   Metabolic acidosis   Acute cholecystitis   Transaminitis   Anemia of chronic disease   Thrombocytopenia (HCC)   Septic shock (HCC)   Urinary tract infection without hematuria  #1 septic shock secondary to acute cholangitis and  probable acute cholecystitis,/transaminitis.  POA -Patient noted during the hospitalization to be persistently hypotensive despite IV fluid resuscitation requiring pressors. -CT angiogram abdomen and pelvis done with no evidence of aortic aneurysm or dissection.  Aortic atherosclerosis.  Old granulomatous disease.  Hepatic steatosis. -Right upper quadrant ultrasound done with small amount of gallbladder sludge however no definite gallstones identified.  Mild diffuse gallbladder gallbladder wall thickening which is nonspecific.  Mild dilatation of CBD measuring 10 mm containing sludge. -Workup done including MRCP with mild gallbladder wall thickening, pericholecystic edema highly suspicious for acute cholecystitis.  Mild dilatation of central intrahepatic bile ducts and CBD to the level of the ampulla.  T2 hypointense/seen throughout the common bile duct without definite calculi.  Cholangitis cannot be excluded.  Small several subscapular wedge-shaped signal abnormalities and there are peripheral right and left hepatic lobes noted nonspecific.  Mild hepatic steatosis.  Mild perihepatic ascites. -ERCP done by GI concerning for ascending cholangitis based upon dark bile and purulent debris.  Concern for cholecystitis as well. -Patient slowly improving clinically, currently on IV Zosyn. -Patient seen by general surgery and patient s/p laparoscopic partial cholecystectomy on 03/31/2022 purulent fluid noted inside gallbladder.  -IV fluids discontinued.   -Continue current pain management. -Patient tolerated full liquid diet and diet being advanced to soft diet per general surgery.   -ID consulted and assessed patient and IV Zosyn has been transition to oral Augmentin to complete a 2-week course of antibiotic treatment from lap cholecystectomy with EOT of 04/13/2022.  Per ID recommendations.   -Will need outpatient follow-up with ID on  04/13/2022..  -Per general surgery and GI. -Mobilize.  2.  Metabolic  acidosis -Likely second to acute infection. -Improved on bicarb drip. -Continue empiric IV antibiotics. -Bicarb drip discontinued.    3.  Anemia of chronic disease -Hemoglobin currently at 8.9, likely dilutional, patient with no overt bleeding. -Anemia panel consistent with anemia of chronic disease. -Follow H&H. -Transfusion hemoglobin < 7.  4.  Acute renal failure -Likely secondary to a prerenal azotemia and in the setting of ARB. -Urine output of 4.950 L over the past 24 hours after being placed on IV Lasix.  -Renal function improving.  5.  Hypokalemia/hypomagnesemia -Potassium at 2.5 this morning.   -Magnesium at 1.5.   -Magnesium sulfate 4 g IV x 1.  K-Dur 40 mEq p.o. every 4 hours x 3 doses. -Repeat labs in the AM.   6.  Thrombocytopenia -Likely secondary to acute infection. -Patient with no overt bleeding. -Platelet count stable and slowly trending back up. -Follow. - liver function initially was improving with hydration, starting to trend back up.  -Patient now with concerns for volume overload and as such IV fluids have been discontinued and patient being diuresed.   7.  E. coli UTI -Was on IV Zosyn and transitioned to Augmentin per ID recommendations.   8.  Hypothyroidism -Synthroid.  9.  Hypertension -Continue to hold home antihypertensive medications as patient had presented with septic shock.  Blood pressure improved.  -Patient receiving IV Lasix.   -Follow blood pressure.   10.  Diabetes mellitus type 2 -Last hemoglobin A1c 6.0 on 02/15/2020. -Repeat hemoglobin A1c 6.3 (04/01/2022). -CBG 142 this morning. -Continue to hold home oral hypoglycemic agents.   -SSI.   11.  Hypocalcemia -Corrected calcium of 8.30. -Status post calcium gluconate 1 g IV x 2. -Will give another dose of calcium gluconate x 1. -Vitamin D 25-hydroxy level at 41.82.   -Replete magnesium. -Repeat labs in the AM.  12.  Volume overload -Patient noted to be volume overloaded on  examination on 04/02/2022, decreased breath sounds in the bases, some scattered crackles, 1-2+ lower extremity edema, edema in hands. -Patient with complaints of some shortness of breath on 04/02/2022. -Patient noted to be +8 L during this hospitalization. -Patient placed on IV Lasix x 4 doses in addition to IV albumin with significant urine output of 4.950 L over the past 24 hours.   -Patient with clinical improvement.  -Discontinue IV Lasix.   -Strict I's and O's, daily weights. -Saline lock IV fluids.    DVT prophylaxis: SCDs.  Postop DVT prophylaxis per general surgery. Code Status: Full Family Communication: Updated patient and granddaughter at bedside. Disposition: Likely home with home health once medically improved and stable.  And cleared by GI and general surgery.  Status is: Inpatient Remains inpatient appropriate because: Severity of illness.   Consultants:  Gastroenterology: Dr. Benson Norway 03/28/2022 General surgery: Dr. Johney Maine 03/28/2022 PCCM: Dr. Silas Flood 03/28/2022 ID pending  Procedures:  ERCP with sphincterotomy/papillotomy per GI, Dr. Carlean Purl 03/29/2022 CT angiogram chest abdomen and pelvis 03/27/2022 MRCP 03/28/2022 Right upper quadrant ultrasound 03/28/2022 Laparoscopic partial cholecystectomy per Dr. Rosendo Gros, general surgery 03/31/2022  Significant Hospital Events: Including procedures, antibiotic start and stop dates in addition to other pertinent events   03/28/2021 admitted to the hospital after a week of poor p.o. intake, with abdominal pain, likely cholangitis versus cholecystitis, on levophed 1/16 Off levophed,  Antimicrobials:  IV Rocephin 1/14/ 2024 x 1 dose IV Zosyn 03/28/2022>>>>>>> 04/02/2020 Augmentin 04/02/2022>>>> 04/14/2020   Subjective: Sitting up in chair  sleeping but easily arousable.  Denies any significant shortness of breath.  Feels swelling in her hands have improved since yesterday.  No chest pain.  Denies any abdominal pain.  Tolerating full  liquids.  Continue at bedside.  Objective: Vitals:   04/02/22 0632 04/02/22 1209 04/02/22 2217 04/03/22 0527  BP: (!) 149/65 (!) 155/63 (!) 159/60 (!) 161/68  Pulse: 67 68 68 69  Resp: '20 14 20 16  '$ Temp: (!) 97.5 F (36.4 C) 97.6 F (36.4 C) 97.9 F (36.6 C) 97.8 F (36.6 C)  TempSrc: Oral Oral Oral Oral  SpO2: 98% 98% 97% 96%    Intake/Output Summary (Last 24 hours) at 04/03/2022 1105 Last data filed at 04/03/2022 0600 Gross per 24 hour  Intake 693.17 ml  Output 4950 ml  Net -4256.83 ml    There were no vitals filed for this visit.  Examination:  General exam: NAD. Respiratory system: Lungs clear to auscultation bilaterally.  Decreased breath sounds in the bases.  No crackles noted.  No wheezing.  Fair air movement.  Speaking in full sentences. Cardiovascular system: Regular rate rhythm no murmurs rubs or gallops.  No JVD.  Trace bilateral lower extremity edema.  Gastrointestinal system: Abdomen is soft, less distended, nontender to palpation, positive bowel sounds, decreased diffuse swelling.  No rebound.  No guarding.  Incision sites c/d/I,  Central nervous system: Alert and oriented. No focal neurological deficits. Extremities: Symmetric 5 x 5 power. Skin: No rashes, lesions or ulcers Psychiatry: Judgement and insight appear normal. Mood & affect appropriate.     Data Reviewed: I have personally reviewed following labs and imaging studies  CBC: Recent Labs  Lab 03/30/22 1534 03/31/22 0500 04/01/22 0528 04/02/22 0449 04/03/22 0445  WBC 15.3* 15.2* 9.9 14.9* 15.3*  NEUTROABS  --  13.3* 8.0* 11.3* 11.9*  HGB 9.0* 8.3* 8.4* 8.6* 8.9*  HCT 25.9* 24.3* 24.8* 24.6* 25.0*  MCV 89.6 91.4 91.2 87.9 85.9  PLT 57* 59* 71* 91* 76*     Basic Metabolic Panel: Recent Labs  Lab 03/28/22 1129 03/29/22 0325 03/30/22 0500 03/31/22 0500 04/01/22 0528 04/02/22 0449  NA 133* 134* 136 138 138 138  K 2.9* 4.0 3.4* 3.9 3.8 2.8*  CL 98 104 111 113* 105 99  CO2 20* 18*  16* 16* 17* 27  GLUCOSE 197* 140* 116* 98 163* 149*  BUN 25* 33* 27* 20 23 24*  CREATININE 1.00 1.15* 1.15* 1.01* 1.01* 0.83  CALCIUM 8.3* 7.9* 7.0* 7.2* 7.0* 7.1*  MG 1.9  --  1.8 2.4  --  1.9  PHOS 3.8  --   --   --   --   --      GFR: CrCl cannot be calculated (Unknown ideal weight.).  Liver Function Tests: Recent Labs  Lab 03/29/22 0325 03/30/22 0500 03/31/22 0500 04/01/22 0528 04/02/22 0449  AST 649* 258* 126* 73* 47*  ALT 582* 350* 267* 193* 149*  ALKPHOS 114 100 109 95 106  BILITOT 4.4* 2.8* 2.2* 2.3* 1.2  PROT 6.0* 4.5* 4.7* 4.5* 4.6*  ALBUMIN 3.7 2.8* 2.4* 2.1* 2.3*     CBG: Recent Labs  Lab 04/02/22 0645 04/02/22 1327 04/02/22 1746 04/02/22 2130 04/03/22 0716  GLUCAP 139* 128* 146* 128* 142*      Recent Results (from the past 240 hour(s))  Urine Culture     Status: Abnormal   Collection Time: 03/27/22  7:45 PM   Specimen: Urine, Clean Catch  Result Value Ref Range Status   Specimen Description  Final    URINE, CLEAN CATCH Performed at [facility], [address] [place], [place] [address]    Special Requests   Final    NONE Performed at [facility], [address] [place], [place] [address]    Culture >=100,000 COLONIES/mL ESCHERICHIA COLI (A)  Final   Report Status 03/30/2022 FINAL  Final   Organism ID, Bacteria ESCHERICHIA COLI (A)  Final      Susceptibility   Escherichia coli - MIC*    AMPICILLIN <=2 SENSITIVE Sensitive     CEFAZOLIN <=4 SENSITIVE Sensitive     CEFEPIME <=0.12 SENSITIVE Sensitive     CEFTRIAXONE <=0.25 SENSITIVE Sensitive     CIPROFLOXACIN <=0.25 SENSITIVE Sensitive     GENTAMICIN <=1 SENSITIVE Sensitive     IMIPENEM <=0.25 SENSITIVE Sensitive     NITROFURANTOIN <=16 SENSITIVE Sensitive     TRIMETH/SULFA <=20 SENSITIVE Sensitive     AMPICILLIN/SULBACTAM <=2 SENSITIVE Sensitive     PIP/TAZO <=4 SENSITIVE Sensitive     * >=100,000 COLONIES/mL ESCHERICHIA COLI  MRSA Next Gen by PCR, Nasal      Status: None   Collection Time: 03/28/22  9:39 PM   Specimen: Nasal Mucosa; Nasal Swab  Result Value Ref Range Status   MRSA by PCR Next Gen NOT DETECTED NOT DETECTED Final    Comment: (NOTE) The GeneXpert MRSA Assay (FDA approved for NASAL specimens only), is one component of a comprehensive MRSA colonization surveillance program. It is not intended to diagnose MRSA infection nor to guide or monitor treatment for MRSA infections. Test performance is not FDA approved in patients less than 37 years old. Performed at [facility], [place] [address] [place], [place] [address]          Radiology Studies: No results found.      Scheduled Meds:  acetaminophen  1,000 mg Oral Q6H   amoxicillin-clavulanate  1 tablet Oral Q12H   Chlorhexidine Gluconate Cloth  6 each Topical Q2200   furosemide  20 mg Intravenous BID   insulin aspart  0-9 Units Subcutaneous TID WC   levothyroxine  75 mcg Oral Q0600   lip balm   Topical BID   polyethylene glycol  17 g Oral Daily   sodium chloride flush  10-40 mL Intracatheter Q12H   Continuous Infusions:  sodium chloride Stopped (03/29/22 1113)   famotidine (PEPCID) IV Stopped (04/03/22 0955)   methocarbamol (ROBAXIN) IV     ondansetron (ZOFRAN) IV       LOS: 6 days    Time spent: 40 minutes    Irine Seal, MD [facility]   To contact the attending provider between 7A-7P or the covering provider during after hours 7P-7A, please log into the web site [email or web address] and access using universal  password for that web site. If you do not have the password, please call the hospital operator.  04/03/2022, 11:05 AM

## 2022-04-04 ENCOUNTER — Inpatient Hospital Stay (HOSPITAL_COMMUNITY): Payer: Medicare Other

## 2022-04-04 DIAGNOSIS — N390 Urinary tract infection, site not specified: Secondary | ICD-10-CM | POA: Diagnosis not present

## 2022-04-04 DIAGNOSIS — K8309 Other cholangitis: Secondary | ICD-10-CM | POA: Diagnosis not present

## 2022-04-04 DIAGNOSIS — N182 Chronic kidney disease, stage 2 (mild): Secondary | ICD-10-CM | POA: Diagnosis not present

## 2022-04-04 DIAGNOSIS — K810 Acute cholecystitis: Secondary | ICD-10-CM | POA: Diagnosis not present

## 2022-04-04 LAB — CBC WITH DIFFERENTIAL/PLATELET
Abs Immature Granulocytes: 0.86 10*3/uL — ABNORMAL HIGH (ref 0.00–0.07)
Basophils Absolute: 0.1 10*3/uL (ref 0.0–0.1)
Basophils Relative: 0 %
Eosinophils Absolute: 0 10*3/uL (ref 0.0–0.5)
Eosinophils Relative: 0 %
HCT: 28.7 % — ABNORMAL LOW (ref 36.0–46.0)
Hemoglobin: 10.1 g/dL — ABNORMAL LOW (ref 12.0–15.0)
Immature Granulocytes: 4 %
Lymphocytes Relative: 9 %
Lymphs Abs: 1.9 10*3/uL (ref 0.7–4.0)
MCH: 30.5 pg (ref 26.0–34.0)
MCHC: 35.2 g/dL (ref 30.0–36.0)
MCV: 86.7 fL (ref 80.0–100.0)
Monocytes Absolute: 0.8 10*3/uL (ref 0.1–1.0)
Monocytes Relative: 4 %
Neutro Abs: 17.9 10*3/uL — ABNORMAL HIGH (ref 1.7–7.7)
Neutrophils Relative %: 83 %
Platelets: 120 10*3/uL — ABNORMAL LOW (ref 150–400)
RBC: 3.31 MIL/uL — ABNORMAL LOW (ref 3.87–5.11)
RDW: 15.4 % (ref 11.5–15.5)
WBC: 21.5 10*3/uL — ABNORMAL HIGH (ref 4.0–10.5)
nRBC: 0.1 % (ref 0.0–0.2)

## 2022-04-04 LAB — COMPREHENSIVE METABOLIC PANEL
ALT: 517 U/L — ABNORMAL HIGH (ref 0–44)
AST: 742 U/L — ABNORMAL HIGH (ref 15–41)
Albumin: 3 g/dL — ABNORMAL LOW (ref 3.5–5.0)
Alkaline Phosphatase: 114 U/L (ref 38–126)
Anion gap: 11 (ref 5–15)
BUN: 15 mg/dL (ref 8–23)
CO2: 26 mmol/L (ref 22–32)
Calcium: 7.2 mg/dL — ABNORMAL LOW (ref 8.9–10.3)
Chloride: 93 mmol/L — ABNORMAL LOW (ref 98–111)
Creatinine, Ser: 0.86 mg/dL (ref 0.44–1.00)
GFR, Estimated: >60 mL/min (ref >60)
Glucose, Bld: 153 mg/dL — ABNORMAL HIGH (ref 70–99)
Potassium: 3.6 mmol/L (ref 3.5–5.1)
Sodium: 130 mmol/L — ABNORMAL LOW (ref 135–145)
Total Bilirubin: 2.3 mg/dL — ABNORMAL HIGH (ref 0.3–1.2)
Total Protein: 5.4 g/dL — ABNORMAL LOW (ref 6.5–8.1)

## 2022-04-04 LAB — GLUCOSE, CAPILLARY
Glucose-Capillary: 137 mg/dL — ABNORMAL HIGH (ref 70–99)
Glucose-Capillary: 176 mg/dL — ABNORMAL HIGH (ref 70–99)
Glucose-Capillary: 157 mg/dL — ABNORMAL HIGH (ref 70–99)
Glucose-Capillary: 151 mg/dL — ABNORMAL HIGH (ref 70–99)

## 2022-04-04 LAB — MAGNESIUM: Magnesium: 2.1 mg/dL (ref 1.7–2.4)

## 2022-04-04 MED ORDER — IOHEXOL 9 MG/ML PO SOLN
500.0000 mL | ORAL | Status: AC
  Administered 2022-04-04 (×2): 500 mL via ORAL

## 2022-04-04 MED ORDER — IOHEXOL 300 MG/ML  SOLN
100.0000 mL | Freq: Once | INTRAMUSCULAR | Status: AC | PRN
  Administered 2022-04-04: 100 mL via INTRAVENOUS

## 2022-04-04 MED ORDER — FUROSEMIDE 10 MG/ML IJ SOLN
20.0000 mg | Freq: Two times a day (BID) | INTRAMUSCULAR | Status: AC
  Administered 2022-04-04 – 2022-04-06 (×4): 20 mg via INTRAVENOUS
  Filled 2022-04-04 (×4): qty 2

## 2022-04-04 MED ORDER — POTASSIUM CHLORIDE CRYS ER 10 MEQ PO TBCR
40.0000 meq | EXTENDED_RELEASE_TABLET | Freq: Once | ORAL | Status: AC
  Administered 2022-04-04: 40 meq via ORAL
  Filled 2022-04-04: qty 4

## 2022-04-04 NOTE — Progress Notes (Signed)
   CT  scan w/o cause of problems unless the heterogeneity of live indicates some ischemic changes.  We will see tomorrow  Gatha Mayer, MD, [facility] Gastroenterology See [name] on call - gastroenterology for best contact person 04/04/2022 2:42 PM

## 2022-04-04 NOTE — Progress Notes (Signed)
4 Days Post-Op   Subjective/Chief Complaint: SLEEPY THIS AM  No major changes    Objective: Vital signs in last 24 hours: Temp:  [97.6 F (36.4 C)-98.6 F (37 C)] 97.8 F (36.6 C) (01/21 0419) Pulse Rate:  [72-83] 73 (01/21 0419) Resp:  [14-20] 14 (01/21 0419) BP: (136-157)/(60-63) 136/63 (01/21 0419) SpO2:  [96 %-98 %] 98 % (01/21 0419) Last BM Date : 04/03/22  Intake/Output from previous day: 01/20 0701 - 01/21 0700 In: 28 [IV Piggyback:28] Out: 2400 [Urine:2400] Intake/Output this shift: No intake/output data recorded.  CDI port sites soft   Lab Results:  Recent Labs    04/03/22 0445 04/04/22 0256  WBC 15.3* 21.5*  HGB 8.9* 10.1*  HCT 25.0* 28.7*  PLT 76* 120*   BMET Recent Labs    04/03/22 0445 04/04/22 0256  NA 136 130*  K 2.5* 3.6  CL 95* 93*  CO2 29 26  GLUCOSE 128* 153*  BUN 15 15  CREATININE 0.78 0.86  CALCIUM 7.9* 7.2*   PT/INR No results for input(s): "LABPROT", "INR" in the last 72 hours. ABG No results for input(s): "PHART", "HCO3" in the last 72 hours.  Invalid input(s): "PCO2", "PO2"  Studies/Results: No results found.  Anti-infectives: Anti-infectives (From admission, onward)    Start     Dose/Rate Route Frequency Ordered Stop   04/02/22 2000  amoxicillin-clavulanate (AUGMENTIN) 875-125 MG per tablet 1 tablet        1 tablet Oral Every 12 hours 04/02/22 1202 04/14/22 2159   03/29/22 2100  piperacillin-tazobactam (ZOSYN) IVPB 3.375 g  Status:  Discontinued        3.375 g 12.5 mL/hr over 240 Minutes Intravenous Every 8 hours 03/28/22 2051 03/29/22 0947   03/29/22 1000  piperacillin-tazobactam (ZOSYN) IVPB 3.375 g  Status:  Discontinued        3.375 g 12.5 mL/hr over 240 Minutes Intravenous Every 8 hours 03/29/22 0947 04/02/22 1202   03/29/22 0800  cefTRIAXone (ROCEPHIN) 2 g in sodium chloride 0.9 % 100 mL IVPB  Status:  Discontinued        2 g 200 mL/hr over 30 Minutes Intravenous Every 24 hours 03/28/22 1058 03/28/22 1212    03/29/22 0800  cefTRIAXone (ROCEPHIN) 1 g in sodium chloride 0.9 % 100 mL IVPB  Status:  Discontinued        1 g 200 mL/hr over 30 Minutes Intravenous Every 24 hours 03/28/22 1212 03/28/22 1603   03/28/22 1700  piperacillin-tazobactam (ZOSYN) IVPB 3.375 g  Status:  Discontinued        3.375 g 12.5 mL/hr over 240 Minutes Intravenous Every 8 hours 03/28/22 1603 03/28/22 2051   03/28/22 1115  metroNIDAZOLE (FLAGYL) IVPB 500 mg  Status:  Discontinued        500 mg 100 mL/hr over 60 Minutes Intravenous 2 times daily 03/28/22 1058 03/28/22 1609   03/28/22 0645  cefTRIAXone (ROCEPHIN) 1 g in sodium chloride 0.9 % 100 mL IVPB        1 g 200 mL/hr over 30 Minutes Intravenous  Once 03/28/22 0644 03/28/22 0801       Assessment/Plan: s/p Procedure(s): LAPAROSCOPIC CHOLECYSTECTOMY (N/A) POD 4  s/p laparoscopic partial cholecystectomy 1/17 AR - WBC 21    and transaminases up as well - CT scan abdomen pelvis today  - LFTs worse- see above . T bili stable  - - , advance soft but go slowly, cont ensure - IS, PT/OT, mobilize   FEN: soft ID: zosyn (UTI) VTE: SCDs,  hold LMWH for thrombocytopenia  LOS: 7 days    Turner Daniels MD  04/04/2022

## 2022-04-04 NOTE — Progress Notes (Signed)
PROGRESS NOTE    Lauren [name]  XIH:[id number] DOB: Aug 18, 1940 DOA: 03/27/2022 PCP: Jani Gravel, MD    Chief Complaint  Patient presents with   Abdominal Pain    Brief Narrative:  82 year old woman admitted to hospital with abdominal pain, several days of poor p.o. intake found to have cholangitis versus cholecystitis.   History largely per son at bedside.  Several days of poor appetite.  Not eating very much.  Abdominal pain worsened after eating bacon.  Presented to the ED.  They are ultrasound with dilated bile duct, sludge, gallbladder thickening.  MRCP subsequently obtained.  This demonstrated concern for cholangitis versus cholecystitis.  Surgery and GI were consulted.  Felt cholecystitis was less likely, most likely cholangitis.  Plan for ERCP 1/15 which was concerning for ascending cholangitis and acute cholecystitis.  Patient noted on the afternoon of 03/28/2022 to be, more hypotensive.  Not responding to fluids.  Transferred to the stepdown unit.  Patient required pressors.  Labs notable for creatinine doubled.  LFTs essentially stable with rising T. bili.  General surgery assessed patient as well.  Patient weaned off pressors transferred to the floor and transferred to [facility] service.  Patient for laparoscopic cholecystectomy 03/31/2022.   Assessment & Plan:   Principal Problem:   Ascending cholangitis Active Problems:   HTN (hypertension)   Hyperlipidemia   Type 2 diabetes mellitus (HCC)   GERD (gastroesophageal reflux disease)   Hypothyroidism   Nausea without vomiting   Chronic kidney disease, stage 2 (mild)   Iron deficiency anemia   Recurrent urinary tract infection   Vertigo   Hepatitis   Hypokalemia   LFT elevation   Shock (HCC)   Metabolic acidosis   Acute cholecystitis   Transaminitis   Anemia of chronic disease   Thrombocytopenia (HCC)   Septic shock (HCC)   Urinary tract infection without hematuria  #1 septic shock secondary to acute cholangitis and  probable acute cholecystitis,/transaminitis.  POA -Patient noted during the hospitalization to be persistently hypotensive despite IV fluid resuscitation requiring pressors. -CT angiogram abdomen and pelvis done with no evidence of aortic aneurysm or dissection.  Aortic atherosclerosis.  Old granulomatous disease.  Hepatic steatosis. -Right upper quadrant ultrasound done with small amount of gallbladder sludge however no definite gallstones identified.  Mild diffuse gallbladder gallbladder wall thickening which is nonspecific.  Mild dilatation of CBD measuring 10 mm containing sludge. -Workup done including MRCP with mild gallbladder wall thickening, pericholecystic edema highly suspicious for acute cholecystitis.  Mild dilatation of central intrahepatic bile ducts and CBD to the level of the ampulla.  T2 hypointense/seen throughout the common bile duct without definite calculi.  Cholangitis cannot be excluded.  Small several subscapular wedge-shaped signal abnormalities and there are peripheral right and left hepatic lobes noted nonspecific.  Mild hepatic steatosis.  Mild perihepatic ascites. -ERCP done by GI concerning for ascending cholangitis based upon dark bile and purulent debris.  Concern for cholecystitis as well. -Patient slowly improving clinically, currently on IV Zosyn. -Patient seen by general surgery and patient s/p laparoscopic partial cholecystectomy on 03/31/2022 purulent fluid noted inside gallbladder.  -IV fluids discontinued.   -Continue current pain management. -Patient tolerated full liquid diet and diet being advanced to soft diet per general surgery.   -ID consulted and assessed patient and IV Zosyn has been transition to oral Augmentin to complete a 2-week course of antibiotic treatment from lap cholecystectomy with EOT of 04/13/2022.  Per ID recommendations.   -Patient now with worsening leukocytosis, worsening transaminitis,  afebrile. -Patient being treated for E. coli UTI on  Augmentin. -Repeat blood cultures x 2, check a chest x-ray. -CT abdomen and pelvis ordered per general surgery for further evaluation. -Continue Augmentin for now. -Repeat labs in the AM. -Will need outpatient follow-up with ID on 04/13/2022..  -Per general surgery and GI. -Mobilize.  2.  Metabolic acidosis -Likely second to acute infection. -Improved on bicarb drip. -Continue empiric IV antibiotics. -Bicarb drip discontinued.    3.  Anemia of chronic disease -Hemoglobin currently at 10.1, likely dilutional, patient with no overt bleeding. -Anemia panel consistent with anemia of chronic disease. -Follow H&H. -Transfusion hemoglobin < 7.  4.  Acute renal failure -Likely secondary to a prerenal azotemia and in the setting of ARB. -Urine output of 2.4 L over the past 24 hours after being placed on IV Lasix.  -Renal function improved.  5.  Hypokalemia/hypomagnesemia -Potassium repleted currently at 3.6.  Magnesium at 2.1 today from 1.5.   -Will give a dose of K-Dur 40 mEq p.o. x 1.   -Repeat labs in the AM.    6.  Thrombocytopenia -Likely secondary to acute infection. -Patient with no overt bleeding. -Platelet count stable and slowly trending back up. -Follow. - liver function initially was improving with hydration, starting to trend back up.  -Patient with concerns for volume overload and as such IV fluids have been discontinued and patient status post IV Lasix.   7.  E. coli UTI -Was on IV Zosyn and transitioned to Augmentin per ID recommendations.   8.  Hypothyroidism -Continue Synthroid.  9.  Hypertension -Continue to hold home antihypertensive medications as patient had presented with septic shock.  Blood pressure improved.  -Patient s/p IV Lasix.   -Follow blood pressure.   10.  Diabetes mellitus type 2 -Last hemoglobin A1c 6.0 on 02/15/2020. -Repeat hemoglobin A1c 6.3 (04/01/2022). -CBG 137 this morning. -Continue to hold home oral hypoglycemic agents.   -SSI.    11.  Hypocalcemia -Corrected calcium of 8.10. -Status post calcium gluconate 1 g IV x 3. -Vitamin D 25-hydroxy level at 41.82.   -Magnesium at 2.1. -Repeat labs in the AM.  12.  Volume overload -Patient noted to be volume overloaded on examination on 04/02/2022, decreased breath sounds in the bases, some scattered crackles, 1-2+ lower extremity edema, edema in hands. -Patient with complaints of some shortness of breath on 04/02/2022. -Patient noted to be +8 L during this hospitalization. -Patient placed on IV Lasix x 4 doses in addition to IV albumin with significant urine output of 2.4 L over the past 24 hours.   -Patient with clinical improvement.  -Discontinued IV Lasix.   -Strict I's and O's, daily weights. -Saline lock IV fluids.    DVT prophylaxis: SCDs.  Postop DVT prophylaxis per general surgery. Code Status: Full Family Communication: Updated patient and granddaughter at bedside. Disposition: Likely home with home health once medically improved and stable.  And cleared by GI and general surgery.  Status is: Inpatient Remains inpatient appropriate because: Severity of illness.   Consultants:  Gastroenterology: Dr. Benson Norway 03/28/2022 General surgery: Dr. Johney Maine 03/28/2022 PCCM: Dr. Silas Flood 03/28/2022 ID pending  Procedures:  ERCP with sphincterotomy/papillotomy per GI, Dr. Carlean Purl 03/29/2022 CT angiogram chest abdomen and pelvis 03/27/2022 MRCP 03/28/2022 Right upper quadrant ultrasound 03/28/2022 Laparoscopic partial cholecystectomy per Dr. Rosendo Gros, general surgery 03/31/2022  Significant Hospital Events: Including procedures, antibiotic start and stop dates in addition to other pertinent events   03/28/2021 admitted to the hospital after a week of poor  p.o. intake, with abdominal pain, likely cholangitis versus cholecystitis, on levophed 1/16 Off levophed,  Antimicrobials:  IV Rocephin 1/14/ 2024 x 1 dose IV Zosyn 03/28/2022>>>>>>> 04/02/2020 Augmentin 04/02/2022>>>>  04/14/2020   Subjective: Sitting up in chair drinking contrast.  Feels swelling in the hands have improved however states has a little bit of swelling in the left hand.  No chest pain.  No significant shortness of breath.  No abdominal pain.  Tolerating soft diet.  Granddaughter at bedside.    Objective: Vitals:   04/03/22 0527 04/03/22 1128 04/03/22 2024 04/04/22 0419  BP: (!) 161/68 (!) 157/63 (!) 142/60 136/63  Pulse: 69 72 83 73  Resp: '16 20 14 14  '$ Temp: 97.8 F (36.6 C) 98.6 F (37 C) 97.6 F (36.4 C) 97.8 F (36.6 C)  TempSrc: Oral Oral Oral Oral  SpO2: 96% 97% 96% 98%    Intake/Output Summary (Last 24 hours) at 04/04/2022 1018 Last data filed at 04/04/2022 0500 Gross per 24 hour  Intake 27.97 ml  Output 2400 ml  Net -2372.03 ml    There were no vitals filed for this visit.  Examination:  General exam: NAD. Respiratory system: Some decreased bibasilar breath sounds with some bibasilar crackles.  No wheezing.  Fair air movement.  Speaking in full sentences.  Cardiovascular system: RRR no murmurs rubs or gallops.  No JVD.  No lower extremity edema.   Gastrointestinal system: Abdomen is soft, mildly distended, nontender to palpation, decreased diffuse swelling, positive bowel sounds.  No rebound.  No guarding.  Incision sites c/d/I,  Central nervous system: Alert and oriented. No focal neurological deficits. Extremities: Symmetric 5 x 5 power. Skin: No rashes, lesions or ulcers Psychiatry: Judgement and insight appear normal. Mood & affect appropriate.     Data Reviewed: I have personally reviewed following labs and imaging studies  CBC: Recent Labs  Lab 03/31/22 0500 04/01/22 0528 04/02/22 0449 04/03/22 0445 04/04/22 0256  WBC 15.2* 9.9 14.9* 15.3* 21.5*  NEUTROABS 13.3* 8.0* 11.3* 11.9* 17.9*  HGB 8.3* 8.4* 8.6* 8.9* 10.1*  HCT 24.3* 24.8* 24.6* 25.0* 28.7*  MCV 91.4 91.2 87.9 85.9 86.7  PLT 59* 71* 91* 76* 120*     Basic Metabolic Panel: Recent Labs   Lab 03/28/22 1129 03/29/22 0325 03/30/22 0500 03/31/22 0500 04/01/22 0528 04/02/22 0449 04/03/22 0445 04/04/22 0256  NA 133*   < > 136 138 138 138 136 130*  K 2.9*   < > 3.4* 3.9 3.8 2.8* 2.5* 3.6  CL 98   < > 111 113* 105 99 95* 93*  CO2 20*   < > 16* 16* 17* '27 29 26  '$ GLUCOSE 197*   < > 116* 98 163* 149* 128* 153*  BUN 25*   < > 27* 20 23 24* 15 15  CREATININE 1.00   < > 1.15* 1.01* 1.01* 0.83 0.78 0.86  CALCIUM 8.3*   < > 7.0* 7.2* 7.0* 7.1* 7.9* 7.2*  MG 1.9  --  1.8 2.4  --  1.9 1.5* 2.1  PHOS 3.8  --   --   --   --   --   --   --    < > = values in this interval not displayed.     GFR: CrCl cannot be calculated (Unknown ideal weight.).  Liver Function Tests: Recent Labs  Lab 03/31/22 0500 04/01/22 0528 04/02/22 0449 04/03/22 0445 04/04/22 0256  AST 126* 73* 47* 144* 742*  ALT 267* 193* 149* 169* 517*  ALKPHOS  109 95 106 94 114  BILITOT 2.2* 2.3* 1.2 2.5* 2.3*  PROT 4.7* 4.5* 4.6* 5.1* 5.4*  ALBUMIN 2.4* 2.1* 2.3* 3.5 3.0*     CBG: Recent Labs  Lab 04/03/22 0716 04/03/22 1123 04/03/22 1620 04/03/22 2020 04/04/22 0733  GLUCAP 142* 124* 166* 120* 137*      Recent Results (from the past 240 hour(s))  Urine Culture     Status: Abnormal   Collection Time: 03/27/22  7:45 PM   Specimen: Urine, Clean Catch  Result Value Ref Range Status   Specimen Description   Final    URINE, CLEAN CATCH Performed at [facility], [address] [place], [place] [address]    Special Requests   Final    NONE Performed at [facility], [address] [place], [place] [address]    Culture >=100,000 COLONIES/mL ESCHERICHIA COLI (A)  Final   Report Status 03/30/2022 FINAL  Final   Organism ID, Bacteria ESCHERICHIA COLI (A)  Final      Susceptibility   Escherichia coli - MIC*    AMPICILLIN <=2 SENSITIVE Sensitive     CEFAZOLIN <=4 SENSITIVE Sensitive     CEFEPIME <=0.12 SENSITIVE Sensitive     CEFTRIAXONE <=0.25 SENSITIVE Sensitive      CIPROFLOXACIN <=0.25 SENSITIVE Sensitive     GENTAMICIN <=1 SENSITIVE Sensitive     IMIPENEM <=0.25 SENSITIVE Sensitive     NITROFURANTOIN <=16 SENSITIVE Sensitive     TRIMETH/SULFA <=20 SENSITIVE Sensitive     AMPICILLIN/SULBACTAM <=2 SENSITIVE Sensitive     PIP/TAZO <=4 SENSITIVE Sensitive     * >=100,000 COLONIES/mL ESCHERICHIA COLI  MRSA Next Gen by PCR, Nasal     Status: None   Collection Time: 03/28/22  9:39 PM   Specimen: Nasal Mucosa; Nasal Swab  Result Value Ref Range Status   MRSA by PCR Next Gen NOT DETECTED NOT DETECTED Final    Comment: (NOTE) The GeneXpert MRSA Assay (FDA approved for NASAL specimens only), is one component of a comprehensive MRSA colonization surveillance program. It is not intended to diagnose MRSA infection nor to guide or monitor treatment for MRSA infections. Test performance is not FDA approved in patients less than 69 years old. Performed at [facility] [address] [place], [place] [address]          Radiology Studies: No results found.      Scheduled Meds:  acetaminophen  1,000 mg Oral Q6H   amoxicillin-clavulanate  1 tablet Oral Q12H   Chlorhexidine Gluconate Cloth  6 each Topical Q2200   insulin aspart  0-9 Units Subcutaneous TID WC   iohexol  500 mL Oral Q1H   levothyroxine  75 mcg Oral Q0600   lip balm   Topical BID   polyethylene glycol  17 g Oral Daily   sodium chloride flush  10-40 mL Intracatheter Q12H   Continuous Infusions:  sodium chloride Stopped (03/29/22 1113)   famotidine (PEPCID) IV 20 mg (04/03/22 2226)   methocarbamol (ROBAXIN) IV     ondansetron (ZOFRAN) IV       LOS: 7 days    Time spent: 40 minutes    Irine Seal, MD [facility]   To contact the attending provider between 7A-7P or the covering provider during after hours 7P-7A, please log into the web site [email or web address] and access using universal [place] password for that web site. If you do not have the  password, please call the hospital operator.  04/04/2022, 10:18 AM

## 2022-04-05 ENCOUNTER — Inpatient Hospital Stay (HOSPITAL_COMMUNITY): Payer: Medicare Other

## 2022-04-05 DIAGNOSIS — N182 Chronic kidney disease, stage 2 (mild): Secondary | ICD-10-CM | POA: Diagnosis not present

## 2022-04-05 DIAGNOSIS — K810 Acute cholecystitis: Secondary | ICD-10-CM | POA: Diagnosis not present

## 2022-04-05 DIAGNOSIS — R7989 Other specified abnormal findings of blood chemistry: Secondary | ICD-10-CM | POA: Diagnosis not present

## 2022-04-05 DIAGNOSIS — N390 Urinary tract infection, site not specified: Secondary | ICD-10-CM | POA: Diagnosis not present

## 2022-04-05 DIAGNOSIS — K8309 Other cholangitis: Secondary | ICD-10-CM | POA: Diagnosis not present

## 2022-04-05 LAB — CBC WITH DIFFERENTIAL/PLATELET
Abs Immature Granulocytes: 0.8 10*3/uL — ABNORMAL HIGH (ref 0.00–0.07)
Basophils Absolute: 0.1 10*3/uL (ref 0.0–0.1)
Basophils Relative: 0 %
Eosinophils Absolute: 0.1 10*3/uL (ref 0.0–0.5)
Eosinophils Relative: 1 %
HCT: 25.2 % — ABNORMAL LOW (ref 36.0–46.0)
Hemoglobin: 8.9 g/dL — ABNORMAL LOW (ref 12.0–15.0)
Immature Granulocytes: 5 %
Lymphocytes Relative: 9 %
Lymphs Abs: 1.6 10*3/uL (ref 0.7–4.0)
MCH: 30.3 pg (ref 26.0–34.0)
MCHC: 35.3 g/dL (ref 30.0–36.0)
MCV: 85.7 fL (ref 80.0–100.0)
Monocytes Absolute: 0.6 10*3/uL (ref 0.1–1.0)
Monocytes Relative: 3 %
Neutro Abs: 14.8 10*3/uL — ABNORMAL HIGH (ref 1.7–7.7)
Neutrophils Relative %: 82 %
Platelets: 141 10*3/uL — ABNORMAL LOW (ref 150–400)
RBC: 2.94 MIL/uL — ABNORMAL LOW (ref 3.87–5.11)
RDW: 15.6 % — ABNORMAL HIGH (ref 11.5–15.5)
WBC: 17.9 10*3/uL — ABNORMAL HIGH (ref 4.0–10.5)
nRBC: 0 % (ref 0.0–0.2)

## 2022-04-05 LAB — COMPREHENSIVE METABOLIC PANEL
ALT: 1283 U/L — ABNORMAL HIGH (ref 0–44)
AST: 1983 U/L — ABNORMAL HIGH (ref 15–41)
Albumin: 2.7 g/dL — ABNORMAL LOW (ref 3.5–5.0)
Alkaline Phosphatase: 108 U/L (ref 38–126)
Anion gap: 11 (ref 5–15)
BUN: 14 mg/dL (ref 8–23)
CO2: 26 mmol/L (ref 22–32)
Calcium: 7.3 mg/dL — ABNORMAL LOW (ref 8.9–10.3)
Chloride: 94 mmol/L — ABNORMAL LOW (ref 98–111)
Creatinine, Ser: 0.81 mg/dL (ref 0.44–1.00)
GFR, Estimated: >60 mL/min (ref >60)
Glucose, Bld: 128 mg/dL — ABNORMAL HIGH (ref 70–99)
Potassium: 3.7 mmol/L (ref 3.5–5.1)
Sodium: 131 mmol/L — ABNORMAL LOW (ref 135–145)
Total Bilirubin: 1.8 mg/dL — ABNORMAL HIGH (ref 0.3–1.2)
Total Protein: 5 g/dL — ABNORMAL LOW (ref 6.5–8.1)

## 2022-04-05 LAB — MAGNESIUM: Magnesium: 1.8 mg/dL (ref 1.7–2.4)

## 2022-04-05 LAB — GLUCOSE, CAPILLARY
Glucose-Capillary: 125 mg/dL — ABNORMAL HIGH (ref 70–99)
Glucose-Capillary: 135 mg/dL — ABNORMAL HIGH (ref 70–99)
Glucose-Capillary: 142 mg/dL — ABNORMAL HIGH (ref 70–99)
Glucose-Capillary: 173 mg/dL — ABNORMAL HIGH (ref 70–99)

## 2022-04-05 LAB — SURGICAL PATHOLOGY

## 2022-04-05 LAB — PROTIME-INR
INR: 1.5 — ABNORMAL HIGH (ref 0.8–1.2)
Prothrombin Time: 17.6 seconds — ABNORMAL HIGH (ref 11.4–15.2)

## 2022-04-05 LAB — LIPASE, BLOOD: Lipase: 389 U/L — ABNORMAL HIGH (ref 11–51)

## 2022-04-05 MED ORDER — GADOBUTROL 1 MMOL/ML IV SOLN
5.0000 mL | Freq: Once | INTRAVENOUS | Status: AC | PRN
  Administered 2022-04-05: 5 mL via INTRAVENOUS

## 2022-04-05 MED ORDER — MAGNESIUM SULFATE 2 GM/50ML IV SOLN
2.0000 g | Freq: Once | INTRAVENOUS | Status: AC
  Administered 2022-04-05: 2 g via INTRAVENOUS
  Filled 2022-04-05: qty 50

## 2022-04-05 MED ORDER — ALBUMIN HUMAN 25 % IV SOLN
25.0000 g | Freq: Once | INTRAVENOUS | Status: AC
  Administered 2022-04-05: 25 g via INTRAVENOUS
  Filled 2022-04-05: qty 100

## 2022-04-05 MED ORDER — AMOXICILLIN-POT CLAVULANATE 875-125 MG PO TABS
1.0000 | ORAL_TABLET | Freq: Two times a day (BID) | ORAL | Status: DC
  Administered 2022-04-05 – 2022-04-09 (×9): 1 via ORAL
  Filled 2022-04-05 (×9): qty 1

## 2022-04-05 MED ORDER — FAMOTIDINE 20 MG PO TABS
20.0000 mg | ORAL_TABLET | Freq: Every day | ORAL | Status: DC
  Administered 2022-04-05 – 2022-04-08 (×4): 20 mg via ORAL
  Filled 2022-04-05 (×4): qty 1

## 2022-04-05 MED ORDER — PIPERACILLIN-TAZOBACTAM 3.375 G IVPB
3.3750 g | Freq: Three times a day (TID) | INTRAVENOUS | Status: DC
  Administered 2022-04-05: 3.375 g via INTRAVENOUS
  Filled 2022-04-05: qty 50

## 2022-04-05 NOTE — Care Management Important Message (Signed)
Important Message  Patient Details IM Letter given. Name: Lauren [name] MRN: [id number] Date of Birth: 1941/01/22   Medicare Important Message Given:  Yes     Kerin Salen 04/05/2022, 1:44 PM

## 2022-04-05 NOTE — Progress Notes (Addendum)
PROGRESS NOTE    Lauren [name]  FEO:[id number] DOB: 08/19/1940 DOA: 03/27/2022 PCP: Jani Gravel, MD    Chief Complaint  Patient presents with   Abdominal Pain    Brief Narrative:  82 year old woman admitted to hospital with abdominal pain, several days of poor p.o. intake found to have cholangitis versus cholecystitis.   History largely per son at bedside.  Several days of poor appetite.  Not eating very much.  Abdominal pain worsened after eating bacon.  Presented to the ED.  They are ultrasound with dilated bile duct, sludge, gallbladder thickening.  MRCP subsequently obtained.  This demonstrated concern for cholangitis versus cholecystitis.  Surgery and GI were consulted.  Felt cholecystitis was less likely, most likely cholangitis.  Plan for ERCP 1/15 which was concerning for ascending cholangitis and acute cholecystitis.  Patient noted on the afternoon of 03/28/2022 to be, more hypotensive.  Not responding to fluids.  Transferred to the stepdown unit.  Patient required pressors.  Labs notable for creatinine doubled.  LFTs essentially stable with rising T. bili.  General surgery assessed patient as well.  Patient weaned off pressors transferred to the floor and transferred to [facility] service.  Patient for laparoscopic cholecystectomy 03/31/2022.   Assessment & Plan:   Principal Problem:   Ascending cholangitis Active Problems:   HTN (hypertension)   Hyperlipidemia   Type 2 diabetes mellitus (HCC)   GERD (gastroesophageal reflux disease)   Hypothyroidism   Nausea without vomiting   Chronic kidney disease, stage 2 (mild)   Iron deficiency anemia   Recurrent urinary tract infection   Vertigo   Hepatitis   Hypokalemia   LFT elevation   Shock (HCC)   Metabolic acidosis   Acute cholecystitis   Transaminitis   Anemia of chronic disease   Thrombocytopenia (HCC)   Septic shock (HCC)   Urinary tract infection without hematuria  #1 septic shock secondary to acute cholangitis and  probable acute cholecystitis,/transaminitis.  POA -Patient noted during the hospitalization to be persistently hypotensive despite IV fluid resuscitation requiring pressors. -CT angiogram abdomen and pelvis done with no evidence of aortic aneurysm or dissection.  Aortic atherosclerosis.  Old granulomatous disease.  Hepatic steatosis. -Right upper quadrant ultrasound done with small amount of gallbladder sludge however no definite gallstones identified.  Mild diffuse gallbladder gallbladder wall thickening which is nonspecific.  Mild dilatation of CBD measuring 10 mm containing sludge. -Workup done including MRCP with mild gallbladder wall thickening, pericholecystic edema highly suspicious for acute cholecystitis.  Mild dilatation of central intrahepatic bile ducts and CBD to the level of the ampulla.  T2 hypointense/seen throughout the common bile duct without definite calculi.  Cholangitis cannot be excluded.  Small several subscapular wedge-shaped signal abnormalities and there are peripheral right and left hepatic lobes noted nonspecific.  Mild hepatic steatosis.  Mild perihepatic ascites. -ERCP done by GI concerning for ascending cholangitis based upon dark bile and purulent debris.  Concern for cholecystitis as well. -Patient slowly improving clinically, currently on IV Zosyn. -Patient seen by general surgery and patient s/p laparoscopic partial cholecystectomy on 03/31/2022 purulent fluid noted inside gallbladder.  -IV fluids discontinued.   -Continue current pain management. -Patient tolerated full liquid diet and diet being advanced to soft diet per general surgery.   -ID consulted and assessed patient and IV Zosyn has been transition to oral Augmentin to complete a 2-week course of antibiotic treatment from lap cholecystectomy with EOT of 04/13/2022.  Per ID recommendations.   -Patient now with fluctuating leukocytosis, worsening transaminitis  with AST at 1983, ALT at 1283.   -Patient afebrile.   -Patient being treated for E. coli UTI on Augmentin. -Repeat blood cultures x 2 with no growth to date, repeat chest x-ray with moderate layering bilateral pleural effusions with underlying consolidation.  Patient with no respiratory symptoms.   -Repeat CT abdomen and pelvis done with postsurgical changes compatible recent cholecystectomy, 2 small peripheral round/oval hypodensities over the right lobe of the liver with the largest measuring 1.6 cm as these were not seen on previous CT, although present on recent MRI.  Indeterminate may be small abscesses.  New moderate-sized bilateral pleural effusion with associated bilateral basilar atelectasis.  -Check a lipase level.  -Patient placed on IV Lasix x 4 doses. -Continue Augmentin for now. -Repeat labs in the AM. -Will need outpatient follow-up with ID on 04/13/2022..  -Per general surgery and GI. -Mobilize.  2.  Metabolic acidosis -Likely second to acute infection. -Improved on bicarb drip. -Continue empiric antibiotics. -Bicarb drip discontinued.    3.  Anemia of chronic disease -Hemoglobin currently at 8.9, likely dilutional, patient with no overt bleeding. -Anemia panel consistent with anemia of chronic disease. -Follow H&H. -Transfusion hemoglobin < 7.  4.  Acute renal failure -Likely secondary to a prerenal azotemia and in the setting of ARB. -Urine output of 2 L over the past 24 hours after being placed on IV Lasix.  -Renal function improved. -Monitor renal function with diuresis.  5.  Hypokalemia/hypomagnesemia -Potassium repleted currently at 3.7.  Magnesium at 1.8 from 2.1  from 1.5.   -Magnesium sulfate 2 g IV x 1.   -Repeat labs in the AM.    6.  Thrombocytopenia -Likely secondary to acute infection. -Patient with no overt bleeding. -Platelet count stable and slowly trending back up. -Follow. - liver function initially was improving with hydration, starting to trend back up.  -Patient with concerns for volume  overload and as such IV fluids have been discontinued and patient status post IV Lasix.   7.  E. coli UTI -Was on IV Zosyn and transitioned to Augmentin per ID recommendations.   8.  Hypothyroidism -Synthroid.  9.  Hypertension -Continue to hold home antihypertensive medications as patient had presented with septic shock.  Blood pressure improved.  -Patient s/p IV Lasix.   -Patient with concerns for volume overload, new moderate-sized bilateral pleural effusions and is-placed on IV Lasix. -Follow blood pressure.   10.  Diabetes mellitus type 2 -Last hemoglobin A1c 6.0 on 02/15/2020. -Repeat hemoglobin A1c 6.3 (04/01/2022). -CBG 125 this morning. -Continue to hold home oral hypoglycemic agents.   -SSI.   11.  Hypocalcemia -Corrected calcium of 8.34. -Status post calcium gluconate 1 g IV x 3. -Vitamin D 25-hydroxy level at 41.82.   -Magnesium at 2.1. -Repeat labs in the AM.  12.  Volume overload -Patient noted to be volume overloaded on examination on 04/02/2022, decreased breath sounds in the bases, some scattered crackles, 1-2+ lower extremity edema, edema in hands. -Patient with complaints of some shortness of breath on 04/02/2022. -Patient noted to be +8 L during this hospitalization. -Patient placed on IV Lasix x 4 doses in addition to IV albumin with significant urine output of 2 L over the past 24 hours.   -Patient with clinical improvement.  -Repeat CT abdomen and pelvis done with new moderate-sized bilateral pleural effusions with atelectasis. -Patient also with significant rise in LFTs. -Resume Lasix 20 mg IV every 12 hours x 4 doses. -IV albumin x 1. -Strict I's and  O's, daily weights. -Saline lock IV fluids.  13.  Cough -Patient noted coughing when drinking water per granddaughter. -SLP evaluation to rule out aspiration.    DVT prophylaxis: SCDs.  Postop DVT prophylaxis per general surgery. Code Status: Full Family Communication: Updated patient and  granddaughter at bedside. Disposition: Likely home with home health once medically improved and stable.  Improvement with LFTs.  And cleared by GI and general surgery.  Status is: Inpatient Remains inpatient appropriate because: Severity of illness.   Consultants:  Gastroenterology: Dr. Benson Norway 03/28/2022 General surgery: Dr. Johney Maine 03/28/2022 PCCM: Dr. Silas Flood 03/28/2022 ID: Dr. Candiss Norse: 04/02/2022  Procedures:  ERCP with sphincterotomy/papillotomy per GI, Dr. Carlean Purl 03/29/2022 CT angiogram chest abdomen and pelvis 03/27/2022 MRCP 03/28/2022 Right upper quadrant ultrasound 03/28/2022 Laparoscopic partial cholecystectomy per Dr. Rosendo Gros, general surgery 03/31/2022 CT abdomen and pelvis 04/04/2022  Significant Hospital Events: Including procedures, antibiotic start and stop dates in addition to other pertinent events   03/28/2021 admitted to the hospital after a week of poor p.o. intake, with abdominal pain, likely cholangitis versus cholecystitis, on levophed 1/16 Off levophed,  Antimicrobials:  IV Rocephin 1/14/ 2024 x 1 dose IV Zosyn 03/28/2022>>>>>>> 04/02/2020 Augmentin 04/02/2022>>>> 04/14/2020   Subjective: Sitting up in chair, noted to have a cough.  Granddaughter at bedside states patient occasionally has some aspiration with water.  Patient denies any chest pain.  No significant shortness of breath.  Denies any abdominal pain.  States sometimes she feels well and sometimes she does not.  Granddaughter at bedside and translating.   Objective: Vitals:   04/03/22 2024 04/04/22 0419 04/04/22 1252 04/04/22 2020  BP: (!) 142/60 136/63 (!) 149/63 (!) 139/57  Pulse: 83 73 72 80  Resp: '14 14 16 20  '$ Temp: 97.6 F (36.4 C) 97.8 F (36.6 C) (!) 97.4 F (36.3 C) 97.6 F (36.4 C)  TempSrc: Oral Oral Oral Oral  SpO2: 96% 98% 100% 99%    Intake/Output Summary (Last 24 hours) at 04/05/2022 1159 Last data filed at 04/05/2022 1030 Gross per 24 hour  Intake 100 ml  Output 2791 ml  Net -2691 ml     There were no vitals filed for this visit.  Examination:  General exam: NAD. Respiratory system: Decreased bibasilar breath sounds.  No crackles.  No wheezing.  Fair air movement.  Speaking in full sentences.   Cardiovascular system: Regular rate rhythm no murmurs rubs or gallops.  No JVD.  Trace to 1+ bilateral lower extremity edema.  Gastrointestinal system: Abdomen soft, mildly distended, nontender to palpation.  Decreased diffuse swelling.  Positive bowel sounds.  No rebound.  No guarding.   Incision sites c/d/I,  Central nervous system: Alert and oriented. No focal neurological deficits. Extremities: Trace to 1+ bilateral lower extremity edema. Skin: No rashes, lesions or ulcers Psychiatry: Judgement and insight appear normal. Mood & affect appropriate.     Data Reviewed: I have personally reviewed following labs and imaging studies  CBC: Recent Labs  Lab 04/01/22 0528 04/02/22 0449 04/03/22 0445 04/04/22 0256 04/05/22 0350  WBC 9.9 14.9* 15.3* 21.5* 17.9*  NEUTROABS 8.0* 11.3* 11.9* 17.9* 14.8*  HGB 8.4* 8.6* 8.9* 10.1* 8.9*  HCT 24.8* 24.6* 25.0* 28.7* 25.2*  MCV 91.2 87.9 85.9 86.7 85.7  PLT 71* 91* 76* 120* 141*     Basic Metabolic Panel: Recent Labs  Lab 03/31/22 0500 04/01/22 0528 04/02/22 0449 04/03/22 0445 04/04/22 0256 04/05/22 0350  NA 138 138 138 136 130* 131*  K 3.9 3.8 2.8* 2.5* 3.6 3.7  CL 113* 105 99 95* 93* 94*  CO2 16* 17* '27 29 26 26  '$ GLUCOSE 98 163* 149* 128* 153* 128*  BUN 20 23 24* '15 15 14  '$ CREATININE 1.01* 1.01* 0.83 0.78 0.86 0.81  CALCIUM 7.2* 7.0* 7.1* 7.9* 7.2* 7.3*  MG 2.4  --  1.9 1.5* 2.1 1.8     GFR: CrCl cannot be calculated (Unknown ideal weight.).  Liver Function Tests: Recent Labs  Lab 04/01/22 0528 04/02/22 0449 04/03/22 0445 04/04/22 0256 04/05/22 0350  AST 73* 47* 144* 742* 1,983*  ALT 193* 149* 169* 517* 1,283*  ALKPHOS 95 106 94 114 108  BILITOT 2.3* 1.2 2.5* 2.3* 1.8*  PROT 4.5* 4.6* 5.1* 5.4*  5.0*  ALBUMIN 2.1* 2.3* 3.5 3.0* 2.7*     CBG: Recent Labs  Lab 04/04/22 1141 04/04/22 1626 04/04/22 2016 04/05/22 0737 04/05/22 1123  GLUCAP 176* 157* 151* 125* 135*      Recent Results (from the past 240 hour(s))  Urine Culture     Status: Abnormal   Collection Time: 03/27/22  7:45 PM   Specimen: Urine, Clean Catch  Result Value Ref Range Status   Specimen Description   Final    URINE, CLEAN CATCH Performed at [facility], [address] [place], [place] [address]    Special Requests   Final    NONE Performed at [facility], [address] [place], [place] [address]    Culture >=100,000 COLONIES/mL ESCHERICHIA COLI (A)  Final   Report Status 03/30/2022 FINAL  Final   Organism ID, Bacteria ESCHERICHIA COLI (A)  Final      Susceptibility   Escherichia coli - MIC*    AMPICILLIN <=2 SENSITIVE Sensitive     CEFAZOLIN <=4 SENSITIVE Sensitive     CEFEPIME <=0.12 SENSITIVE Sensitive     CEFTRIAXONE <=0.25 SENSITIVE Sensitive     CIPROFLOXACIN <=0.25 SENSITIVE Sensitive     GENTAMICIN <=1 SENSITIVE Sensitive     IMIPENEM <=0.25 SENSITIVE Sensitive     NITROFURANTOIN <=16 SENSITIVE Sensitive     TRIMETH/SULFA <=20 SENSITIVE Sensitive     AMPICILLIN/SULBACTAM <=2 SENSITIVE Sensitive     PIP/TAZO <=4 SENSITIVE Sensitive     * >=100,000 COLONIES/mL ESCHERICHIA COLI  MRSA Next Gen by PCR, Nasal     Status: None   Collection Time: 03/28/22  9:39 PM   Specimen: Nasal Mucosa; Nasal Swab  Result Value Ref Range Status   MRSA by PCR Next Gen NOT DETECTED NOT DETECTED Final    Comment: (NOTE) The GeneXpert MRSA Assay (FDA approved for NASAL specimens only), is one component of a comprehensive MRSA colonization surveillance program. It is not intended to diagnose MRSA infection nor to guide or monitor treatment for MRSA infections. Test performance is not FDA approved in patients less than 46 years old. Performed at [facility],  [place] [address] [place], [place] [address]   Culture, blood (Routine X 2) w Reflex to ID Panel     Status: None (Preliminary result)   Collection Time: 04/04/22 10:27 AM   Specimen: BLOOD  Result Value Ref Range Status   Specimen Description   Final    BLOOD LEFT ANTECUBITAL Performed at [place] [address] [place], [place] [address]    Special Requests   Final    BOTTLES DRAWN AEROBIC ONLY Blood Culture adequate volume Performed at [facility] [address] [place], [place] [address]  Culture   Final    NO GROWTH < 24 HOURS Performed at [facility], [place] [address] [place], [place] [address]    Report Status PENDING  Incomplete  Culture, blood (Routine X 2) w Reflex to ID Panel     Status: None (Preliminary result)   Collection Time: 04/04/22 10:27 AM   Specimen: BLOOD  Result Value Ref Range Status   Specimen Description   Final    BLOOD RIGHT ANTECUBITAL Performed at [place] [address] [place], [place] [address]    Special Requests   Final    BOTTLES DRAWN AEROBIC ONLY Blood Culture adequate volume Performed at [facility] [address] [place], [place] [address]    Culture   Final    NO GROWTH < 24 HOURS Performed at [place] [facility] [address], [place], [place] [address]    Report Status PENDING  Incomplete         Radiology Studies: CT ABDOMEN PELVIS W CONTRAST  Result Date: 04/04/2022 CLINICAL DATA:  Abdominal pain.  Postop cholecystectomy. EXAM: CT ABDOMEN AND PELVIS WITH CONTRAST TECHNIQUE: Multidetector CT imaging of the abdomen and pelvis was performed using the standard protocol following bolus administration of intravenous contrast. RADIATION DOSE REDUCTION: This exam was performed according to the departmental dose-optimization program which includes automated exposure control, adjustment of the mA and/or kV according to patient size  and/or use of iterative reconstruction technique. CONTRAST:  147m OMNIPAQUE IOHEXOL 300 MG/ML  SOLN COMPARISON:  03/27/2022, 11/21/2020, MRI 03/28/2022 FINDINGS: Lower chest: 3 mm nodule over the right middle lobe unchanged. Calcified mediastinal and right hilar lymph nodes. New moderate size bilateral pleural effusions with associated bibasilar atelectasis. Calcified plaque over the descending thoracic aorta. Heart is normal size. Hepatobiliary: Postsurgical change compatible with recent cholecystectomy. Minimal pneumobilia over the central and left liver. Air over the common bile duct. Mild diffuse low-attenuation of the liver with geographic peripheral areas of slight increased density possibly due to differential perfusion. Two small peripheral round/oval hypodensities over the right lobe of the liver with the larger measuring 1.6 cm as these were not seen on previous CTs, although present on recent MRI. These are indeterminate and may represent small abscesses. Pancreas: Normal. Spleen: Multiple calcified granulomas, otherwise unremarkable. Adrenals/Urinary Tract: Adrenal glands are normal. Kidneys are normal in size without hydronephrosis or nephrolithiasis. Bladder is decompressed with presence of Foley catheter. Visualized ureters are unremarkable. Stomach/Bowel: Stomach and small bowel are normal. Appendix is unremarkable. Colon is unremarkable. Vascular/Lymphatic: Mild calcified plaque over the abdominal aorta which is normal in caliber. Remaining vascular structures are unremarkable. No adenopathy. Reproductive: Normal. Other: Mild free fluid in the abdomen/pelvis likely postsurgical. Mild diffuse subcutaneous edema over the abdominal/pelvic wall. Musculoskeletal: No focal abnormality. IMPRESSION: 1. Postsurgical change compatible with recent cholecystectomy. Two small peripheral round/oval hypodensities over the right lobe of the liver with the larger measuring 1.6 cm as these were not seen on previous  CTS, although present on recent MRI. These are indeterminate and may represent small abscesses. Recommend attention on follow-up. 2. New moderate size bilateral pleural effusions with associated bibasilar atelectasis. 3. Aortic atherosclerosis. Aortic Atherosclerosis (ICD10-I70.0). Electronically Signed   By: DMarin OlpM.D.   On: 04/04/2022 13:36   DG Chest 2 View  Result Date: 04/04/2022 CLINICAL DATA:  Leukocytosis.  Prior laparoscopic cholecystectomy. EXAM: CHEST - 2 VIEW COMPARISON:  None FINDINGS: Central venous catheter tip projects over the superior vena cava. Monitoring leads  overlie the patient. Cardiac contours upper limits of normal. Probable small to moderate layering bilateral pleural effusions and underlying consolidation. No pneumothorax. Thoracic spine degenerative changes. Probable calcified nodes within the mediastinum. IMPRESSION: Probable small to moderate layering bilateral pleural effusions with underlying consolidation. Electronically Signed   By: Lovey Newcomer M.D.   On: 04/04/2022 13:22        Scheduled Meds:  acetaminophen  1,000 mg Oral Q6H   amoxicillin-clavulanate  1 tablet Oral Q12H   Chlorhexidine Gluconate Cloth  6 each Topical Q2200   famotidine  20 mg Oral QHS   furosemide  20 mg Intravenous Q12H   insulin aspart  0-9 Units Subcutaneous TID WC   levothyroxine  75 mcg Oral Q0600   lip balm   Topical BID   polyethylene glycol  17 g Oral Daily   sodium chloride flush  10-40 mL Intracatheter Q12H   Continuous Infusions:  sodium chloride Stopped (03/29/22 1113)   albumin human     methocarbamol (ROBAXIN) IV     ondansetron (ZOFRAN) IV       LOS: 8 days    Time spent: 40 minutes    Irine Seal, MD [facility]   To contact the attending provider between 7A-7P or the covering provider during after hours 7P-7A, please log into the web site [email or web address] and access using universal [place] password for that web site. If you do not have  the password, please call the hospital operator.  04/05/2022, 11:59 AM

## 2022-04-05 NOTE — Evaluation (Signed)
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   Possible related to pyridium  G6PD studies pending   Hemorrhoids    Hiatal hernia    Hyperlipidemia    Hyperlipidemia    Hypertension    Hypothyroidism    IBS (irritable bowel syndrome)    Iron overload 07/13/2016   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   Osteoporosis    UTI (lower urinary tract infection)    Vitamin D deficiency    Past Surgical History:  Past Surgical History:  Procedure Laterality Date   CHOLECYSTECTOMY N/A 03/31/2022   Procedure: LAPAROSCOPIC CHOLECYSTECTOMY;  Surgeon: Ralene Ok, MD;  Location: [facility];  Service: General;  Laterality: N/A;   COLONOSCOPY     ERCP N/A 03/29/2022   Procedure: ENDOSCOPIC RETROGRADE CHOLANGIOPANCREATOGRAPHY (ERCP);  Surgeon: Gatha Mayer, MD;  Location: [facility];  Service: Gastroenterology;  Laterality: N/A;   PARTIAL KNEE ARTHROPLASTY Left 02/25/2020   Procedure: UNICOMPARTMENTAL KNEE;  Surgeon: Gaynelle Arabian, MD;  Location: [facility];  Service: Orthopedics;  Laterality: Left;  65mn   REMOVAL OF STONES  03/29/2022   Procedure: REMOVAL OF STONES;  Surgeon: GGatha Mayer MD;  [facility];  Service: Gastroenterology;;   SJoan Mayans 03/29/2022   Procedure: SJoan Mayans  Surgeon: GGatha Mayer MD;  Location: [facility];  Service: Gastroenterology;;   HPI:  Patient is an 82y.o. female with PMH: OA, DM, GERD, HLD, HTN, hypothyroidism,  osteoporosis, Lt UKA in 2021. She presented to the hospital on 03/27/22 with abdominal pain and several days of poor PO intake. She was found to have cholangitis vs cholecystitis. She underwent an ERCP on 1/15 and a laparoscopic cholecystectomy on 03/31/22.    Assessment / Plan / Recommendation  Clinical Impression  Patient is presenting with clinical s/s of dysphagia as per findings from this bedside swallow evaluation as wella sn via patient/granddaughter interview. Per patient and confirmed by granddaughter, she is having incidents of "aspirating", coughing when drinking liquids and when having liquid based solids (soups, etc). Prior to surgeries on 1/15 and 1/17, patient reportedly did not have any observed dysphagia. Patient denies any difficulties with solid textures. SLP assessed patient's swallow function with PO intake of thin liquids (water) and aside from instances of multiple swallows, no overt s/s aspiration or penetration observed. SLP recommending objective swallow study to r/o aspiration. Test explained to patient and granddaughter with plan to comleted next date. SLP Visit Diagnosis: Dysphagia, unspecified (R13.10)    Aspiration Risk  Mild aspiration risk;No limitations    Diet Recommendation Dysphagia 3 (Mech soft);Thin liquid   Liquid Administration via: Cup;Straw Medication Administration: Whole meds with liquid Supervision: Patient able to self feed;Intermittent supervision to cue for compensatory strategies Compensations: Slow rate;Small sips/bites Postural Changes: Seated upright at 90 degrees;Remain upright for at least 30 minutes after po intake    Other  Recommendations Oral Care Recommendations: Oral care BID    Recommendations  for follow up therapy are one component of a multi-disciplinary discharge planning process, led by the attending physician.  Recommendations may be updated based on patient status, additional functional criteria and insurance  authorization.  Follow up Recommendations Other (comment) (TBD)      Assistance Recommended at Discharge    Functional Status Assessment Patient has had a recent decline in their functional status and demonstrates the ability to make significant improvements in function in a reasonable and predictable amount of time.  Frequency and Duration min 1 x/week          Prognosis Prognosis for Safe Diet Advancement: Good      Swallow Study   General Date of Onset: 03/31/22 HPI: Patient is an 82 y.o. female with PMH: OA, DM, GERD, HLD, HTN, hypothyroidism, osteoporosis, Lt UKA in 2021. She presented to the hospital on 03/27/22 with abdominal pain and several days of poor PO intake. She was found to have cholangitis vs cholecystitis. She underwent an ERCP on 1/15 and a laparoscopic cholecystectomy on 03/31/22. Type of Study: Bedside Swallow Evaluation Previous Swallow Assessment: none found Diet Prior to this Study: Dysphagia 3 (soft);Thin liquids Temperature Spikes Noted: No Respiratory Status: Room air History of Recent Intubation: No Behavior/Cognition: Cooperative;Alert;Pleasant mood Oral Cavity Assessment: Within Functional Limits Oral Care Completed by SLP: No Oral Cavity - Dentition: Adequate natural dentition Vision: Functional for self-feeding Self-Feeding Abilities: Able to feed self Patient Positioning: Upright in chair Baseline Vocal Quality: Normal Volitional Swallow: Able to elicit    Oral/Motor/Sensory Function Overall Oral Motor/Sensory Function: Within functional limits   Ice Chips     Thin Liquid Thin Liquid: Impaired Presentation: Straw;Self Fed Pharyngeal  Phase Impairments: Multiple swallows    Nectar Thick     Honey Thick     Puree Puree: Not tested   Solid     Solid: Not tested     Sonia Baller, MA, CCC-SLP Speech Therapy

## 2022-04-05 NOTE — Progress Notes (Signed)
Physical Therapy Treatment Patient Details Name: Lauren [name] MRN: [id number] DOB: 11/09/1940 Today's Date: 04/05/2022   History of Present Illness Patient is 82 y.o. female admitted to hospital with abdominal pain, several days of poor p.o. intake found to have cholangitis versus cholecystitis. Pt s/p ERCP on 03/29/22 with plan for lap chole on 03/31/22. PMH significant for OA, DM, GERD, HLD, HTN, hypothyroidism, osteoporosis, Lt UKA in 2021.    PT Comments    Pt assisted with standing however became dizzy which she states is from needing to eat.  Pt assisted to recliner and awaiting lunch.  Pt and family member request for PT to return to ambulate.    Recommendations for follow up therapy are one component of a multi-disciplinary discharge planning process, led by the attending physician.  Recommendations may be updated based on patient status, additional functional criteria and insurance authorization.  Follow Up Recommendations  Home health PT     Assistance Recommended at Discharge Frequent or constant Supervision/Assistance  Patient can return home with the following Assistance with cooking/housework;Direct supervision/assist for medications management;Assist for transportation;Help with stairs or ramp for entrance;A little help with walking and/or transfers;A little help with bathing/dressing/bathroom   Equipment Recommendations  None recommended by PT    Recommendations for Other Services       Precautions / Restrictions Precautions Precautions: Fall Precaution Comments: CVC Right IJ     Mobility  Bed Mobility Overal bed mobility: Needs Assistance Bed Mobility: Supine to Sit     Supine to sit: Min assist, HOB elevated     General bed mobility comments: verbal and tactile cues for self assist, assist for trunk upright    Transfers Overall transfer level: Needs assistance Equipment used: Rolling walker (2 wheels) Transfers: Sit to/from Stand Sit to Stand: Min  assist   Step pivot transfers: Min assist       General transfer comment: verbal and visual cues for positioning and hand placement; pt reported feeling dizzy from not eating up standing and requested to transfer to recliner prior to ambulating    Ambulation/Gait                   Stairs             Wheelchair Mobility    Modified Rankin (Stroke Patients Only)       Balance                                            Cognition Arousal/Alertness: Awake/alert Behavior During Therapy: WFL for tasks assessed/performed Overall Cognitive Status: Within Functional Limits for tasks assessed                                 General Comments: family member assisted with interpreting        Exercises      General Comments        Pertinent Vitals/Pain Pain Assessment Pain Assessment: No/denies pain    Home Living                          Prior Function            PT Goals (current goals can now be found in the care plan section) Progress towards PT goals: Progressing toward goals  Frequency    Min 3X/week      PT Plan Current plan remains appropriate    Co-evaluation              AM-PAC PT "6 Clicks" Mobility   Outcome Measure  Help needed turning from your back to your side while in a flat bed without using bedrails?: A Little Help needed moving from lying on your back to sitting on the side of a flat bed without using bedrails?: A Little Help needed moving to and from a bed to a chair (including a wheelchair)?: A Little Help needed standing up from a chair using your arms (e.g., wheelchair or bedside chair)?: A Little Help needed to walk in hospital room?: A Little Help needed climbing 3-5 steps with a railing? : A Lot 6 Click Score: 17    End of Session Equipment Utilized During Treatment: Gait belt Activity Tolerance: Other (comment) (dizzy, wants to eat first before  ambulating) Patient left: in chair;with call bell/phone within reach;with family/visitor present Nurse Communication: Mobility status PT Visit Diagnosis: Unsteadiness on feet (R26.81);Muscle weakness (generalized) (M62.81);Difficulty in walking, not elsewhere classified (R26.2)     Time: 5929-2446 PT Time Calculation (min) (ACUTE ONLY): 16 min  Charges:  $Therapeutic Activity: 8-22 mins                    Jannette Spanner PT, DPT Physical Therapist Acute Rehabilitation Services Preferred contact method: Secure Chat Weekend Pager Only: [phone] Office: [facility] 04/05/2022, 3:00 PM

## 2022-04-05 NOTE — Progress Notes (Signed)
Physical Therapy Treatment Patient Details Name: Lauren [name] MRN: [id number] DOB: 1940-04-09 Today's Date: 04/05/2022   History of Present Illness Patient is 82 y.o. female admitted to hospital with abdominal pain, several days of poor p.o. intake found to have cholangitis versus cholecystitis. Pt s/p ERCP on 03/29/22 with plan for lap chole on 03/31/22. PMH significant for OA, DM, GERD, HLD, HTN, hypothyroidism, osteoporosis, Lt UKA in 2021.    PT Comments    Pt fatigues quickly however able to progress ambulation distance today to 70 ft! Pt wished to remain in recliner end of session.  Family member present and assisted with translation as well as followed along with recliner for safety.   Recommendations for follow up therapy are one component of a multi-disciplinary discharge planning process, led by the attending physician.  Recommendations may be updated based on patient status, additional functional criteria and insurance authorization.  Follow Up Recommendations  Home health PT     Assistance Recommended at Discharge Frequent or constant Supervision/Assistance  Patient can return home with the following Assistance with cooking/housework;Direct supervision/assist for medications management;Assist for transportation;Help with stairs or ramp for entrance;A little help with walking and/or transfers;A little help with bathing/dressing/bathroom   Equipment Recommendations  None recommended by PT    Recommendations for Other Services       Precautions / Restrictions Precautions Precautions: Fall Precaution Comments: CVC Right IJ     Mobility  Bed Mobility Overal bed mobility: Needs Assistance Bed Mobility: Supine to Sit     Supine to sit: Min assist, HOB elevated     General bed mobility comments: pt in recliner    Transfers Overall transfer level: Needs assistance Equipment used: Rolling walker (2 wheels) Transfers: Sit to/from Stand Sit to Stand: Min guard   Step  pivot transfers: Min assist       General transfer comment: verbal and visual cues for positioning and hand placement    Ambulation/Gait Ambulation/Gait assistance: Min guard Gait Distance (Feet): 70 Feet Assistive device: Rolling walker (2 wheels) Gait Pattern/deviations: Step-through pattern, Decreased stride length, Narrow base of support, Trunk flexed Gait velocity: decreased     General Gait Details: multimodal cues for step length, RW positioning, posture; pt reports feeling "heavy" and "out of body" but able to continue; family member followed closely with recliner for safety   Stairs             Wheelchair Mobility    Modified Rankin (Stroke Patients Only)       Balance                                            Cognition Arousal/Alertness: Awake/alert Behavior During Therapy: WFL for tasks assessed/performed Overall Cognitive Status: Within Functional Limits for tasks assessed                                 General Comments: family member assisted with interpreting        Exercises      General Comments        Pertinent Vitals/Pain Pain Assessment Pain Assessment: No/denies pain    Home Living                          Prior Function  PT Goals (current goals can now be found in the care plan section) Progress towards PT goals: Progressing toward goals    Frequency    Min 3X/week      PT Plan Current plan remains appropriate    Co-evaluation              AM-PAC PT "6 Clicks" Mobility   Outcome Measure  Help needed turning from your back to your side while in a flat bed without using bedrails?: A Little Help needed moving from lying on your back to sitting on the side of a flat bed without using bedrails?: A Little Help needed moving to and from a bed to a chair (including a wheelchair)?: A Little Help needed standing up from a chair using your arms (e.g., wheelchair  or bedside chair)?: A Little Help needed to walk in hospital room?: A Little Help needed climbing 3-5 steps with a railing? : A Lot 6 Click Score: 17    End of Session Equipment Utilized During Treatment: Gait belt Activity Tolerance: Patient tolerated treatment well Patient left: in chair;with call bell/phone within reach;with family/visitor present Nurse Communication: Mobility status PT Visit Diagnosis: Unsteadiness on feet (R26.81);Muscle weakness (generalized) (M62.81);Difficulty in walking, not elsewhere classified (R26.2)     Time: 9470-9628 PT Time Calculation (min) (ACUTE ONLY): 12 min  Charges:  $Gait Training: 8-22 mins           Jannette Spanner PT, DPT Physical Therapist Acute Rehabilitation Services Preferred contact method: Secure Chat Weekend Pager Only: [phone] Office: [place] 04/05/2022, 3:04 PM

## 2022-04-05 NOTE — Plan of Care (Signed)
  Problem: Education: Goal: Knowledge of General Education information will improve Description: Including pain rating scale, medication(s)/side effects and non-pharmacologic comfort measures Outcome: Completed/Met   Problem: Nutrition: Goal: Adequate nutrition will be maintained Outcome: Progressing   Problem: Pain Managment: Goal: General experience of comfort will improve Outcome: Progressing

## 2022-04-05 NOTE — Progress Notes (Addendum)
Progress Note  Primary GI: Dr. Hilarie Fredrickson   Subjective  Chief Complaint: Admitted initially for cholangitis, status post ERCP 01/15 and lap chole 01/17 patient was having initial downtrending LFTs,  consulted again due to liver function elevation  Labs show sodium 131, glucose 128, albumin 2.7, AST 1983, ALT 1283, total albumin 1.8, total protein 5.0, WBC 17.9, Hgb 8.9, MCV 85, platelets 141 Hepatitis panel negative    granddaughter Apolonio Schneiders is in the room and translates.   Patient states she is not in any pain, no nausea or vomiting.  Had bowel movement yesterday brown no evidence of bleeding.  States she feels improved overall.    Objective   Vital signs in last 24 hours: Temp:  [97.4 F (36.3 C)-97.6 F (36.4 C)] 97.6 F (36.4 C) (01/21 2020) Pulse Rate:  [72-80] 80 (01/21 2020) Resp:  [16-20] 20 (01/21 2020) BP: (139-149)/(57-63) 139/57 (01/21 2020) SpO2:  [99 %-100 %] 99 % (01/21 2020) Last BM Date : 04/04/22 Last BM recorded by nurses in past 5 days Stool Type: Type 7 (Liquid consistency with no solid pieces) (04/04/2022  8:00 PM)  General:   female in no acute distress  Heart:  Regular rate and rhythm; no murmurs Pulm: Clear anteriorly; no wheezing Abdomen:  Soft, Non-distended AB, Active bowel sounds. No tenderness , No organomegaly appreciated. Extremities:  without  edema. Neurologic:  Alert and  oriented x4;  No focal deficits.  Psych:  Cooperative. Normal mood and affect.  Intake/Output from previous day: 01/21 0701 - 01/22 0700 In: 100 [IV Piggyback:100] Out: 2092 [Urine:2090; Stool:2] Intake/Output this shift: No intake/output data recorded.  Studies/Results: CT ABDOMEN PELVIS W CONTRAST  Result Date: 04/04/2022 CLINICAL DATA:  Abdominal pain.  Postop cholecystectomy. EXAM: CT ABDOMEN AND PELVIS WITH CONTRAST TECHNIQUE: Multidetector CT imaging of the abdomen and pelvis was performed using the standard protocol following bolus administration of intravenous  contrast. RADIATION DOSE REDUCTION: This exam was performed according to the departmental dose-optimization program which includes automated exposure control, adjustment of the mA and/or kV according to patient size and/or use of iterative reconstruction technique. CONTRAST:  149m OMNIPAQUE IOHEXOL 300 MG/ML  SOLN COMPARISON:  03/27/2022, 11/21/2020, MRI 03/28/2022 FINDINGS: Lower chest: 3 mm nodule over the right middle lobe unchanged. Calcified mediastinal and right hilar lymph nodes. New moderate size bilateral pleural effusions with associated bibasilar atelectasis. Calcified plaque over the descending thoracic aorta. Heart is normal size. Hepatobiliary: Postsurgical change compatible with recent cholecystectomy. Minimal pneumobilia over the central and left liver. Air over the common bile duct. Mild diffuse low-attenuation of the liver with geographic peripheral areas of slight increased density possibly due to differential perfusion. Two small peripheral round/oval hypodensities over the right lobe of the liver with the larger measuring 1.6 cm as these were not seen on previous CTs, although present on recent MRI. These are indeterminate and may represent small abscesses. Pancreas: Normal. Spleen: Multiple calcified granulomas, otherwise unremarkable. Adrenals/Urinary Tract: Adrenal glands are normal. Kidneys are normal in size without hydronephrosis or nephrolithiasis. Bladder is decompressed with presence of Foley catheter. Visualized ureters are unremarkable. Stomach/Bowel: Stomach and small bowel are normal. Appendix is unremarkable. Colon is unremarkable. Vascular/Lymphatic: Mild calcified plaque over the abdominal aorta which is normal in caliber. Remaining vascular structures are unremarkable. No adenopathy. Reproductive: Normal. Other: Mild free fluid in the abdomen/pelvis likely postsurgical. Mild diffuse subcutaneous edema over the abdominal/pelvic wall. Musculoskeletal: No focal abnormality.  IMPRESSION: 1. Postsurgical change compatible with recent cholecystectomy. Two small peripheral round/oval  hypodensities over the right lobe of the liver with the larger measuring 1.6 cm as these were not seen on previous CTS, although present on recent MRI. These are indeterminate and may represent small abscesses. Recommend attention on follow-up. 2. New moderate size bilateral pleural effusions with associated bibasilar atelectasis. 3. Aortic atherosclerosis. Aortic Atherosclerosis (ICD10-I70.0). Electronically Signed   By: Marin Olp M.D.   On: 04/04/2022 13:36   DG Chest 2 View  Result Date: 04/04/2022 CLINICAL DATA:  Leukocytosis.  Prior laparoscopic cholecystectomy. EXAM: CHEST - 2 VIEW COMPARISON:  None FINDINGS: Central venous catheter tip projects over the superior vena cava. Monitoring leads overlie the patient. Cardiac contours upper limits of normal. Probable small to moderate layering bilateral pleural effusions and underlying consolidation. No pneumothorax. Thoracic spine degenerative changes. Probable calcified nodes within the mediastinum. IMPRESSION: Probable small to moderate layering bilateral pleural effusions with underlying consolidation. Electronically Signed   By: Lovey Newcomer M.D.   On: 04/04/2022 13:22    Lab Results: Recent Labs    04/03/22 0445 04/04/22 0256 04/05/22 0350  WBC 15.3* 21.5* 17.9*  HGB 8.9* 10.1* 8.9*  HCT 25.0* 28.7* 25.2*  PLT 76* 120* 141*   BMET Recent Labs    04/03/22 0445 04/04/22 0256 04/05/22 0350  NA 136 130* 131*  K 2.5* 3.6 3.7  CL 95* 93* 94*  CO2 '29 26 26  '$ GLUCOSE 128* 153* 128*  BUN '15 15 14  '$ CREATININE 0.78 0.86 0.81  CALCIUM 7.9* 7.2* 7.3*   LFT Recent Labs    04/05/22 0350  PROT 5.0*  ALBUMIN 2.7*  AST 1,983*  ALT 1,283*  ALKPHOS 108  BILITOT 1.8*   PT/INR No results for input(s): "LABPROT", "INR" in the last 72 hours.   Scheduled Meds:  acetaminophen  1,000 mg Oral Q6H   Chlorhexidine Gluconate Cloth  6  each Topical Q2200   furosemide  20 mg Intravenous Q12H   insulin aspart  0-9 Units Subcutaneous TID WC   levothyroxine  75 mcg Oral Q0600   lip balm   Topical BID   polyethylene glycol  17 g Oral Daily   sodium chloride flush  10-40 mL Intracatheter Q12H   Continuous Infusions:  sodium chloride Stopped (03/29/22 1113)   albumin human     famotidine (PEPCID) IV 20 mg (04/04/22 2031)   magnesium sulfate bolus IVPB     methocarbamol (ROBAXIN) IV     ondansetron (ZOFRAN) IV     piperacillin-tazobactam (ZOSYN)  IV        Patient profile:   82 year old [place] female history of aortic atherosclerosis, hyperlipidemia, type II Beatties, GERD, IDA, 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  S/p lap cholecystectomy 01/17 ID signed off, currently on Augmentin for 2 weeks Initially liver enzymes were improving but increased  Leukocytosis On Augmentin for E. coli UTI Pending blood cultures Chest x-ray unremarkable CT possible new unknown lesions question abscess.  Elevated LFTs Recent Labs  Lab 04/01/22 0528 04/02/22 0449 04/03/22 0445 04/04/22 0256 04/05/22 0350  AST 73* 47* 144* 742* 1,983*  ALT 193* 149* 169* 517* 1,283*  ALKPHOS 95 106 94 114 108  BILITOT 2.3* 1.2 2.5* 2.3* 1.8*  PROT 4.5* 4.6* 5.1*  5.4* 5.0*  ALBUMIN 2.1* 2.3* 3.5 3.0* 2.7*   patient had downtrending LFTs these after ERCP and lap chole however starting from 01/19 to 01/20 had   Increasing elevation of transaminases, alk phos normal, total bili slight increase  Today AST 1983, ALT 1283 alk phos 108  previous negative hepatitis panel  no evidence of hepatic encephalopathy on exam, will  get INR to look for  hepatocellular dysfunction  CT abdomen pelvis with contrast showed 2 small peripheral round oval hypodensities right lobe of the liver largest measuring 1.6 indeterminate may represent small AB assesses recommend attention to follow-up  previous MRCP 03/28/2022   - consider repeating MRCP to evaluate indeterminate lesions  -  With severe elevation question if not shock liver, ? Dili.  -Trend LFTs/INR.  -Avoid hepatotoxic medications -  Further recommendations per Dr. Fuller Plan  Chronic normocytic anemia Previously evaluated 2017 for IDA Anemia panel consistent with anemia of chronic disease No overt GI bleeding    LOS: 8 days   Vladimir Crofts  04/05/2022, 8:25 AM   Attending Physician Note   I have taken an interval history, reviewed the chart and examined the patient. I performed a substantive portion of this encounter, including complete performance of at least one of the key components, in conjunction with the APP. I agree with the APP's note, impression and recommendations with my edits. My additional impressions and recommendations are as follows.   AST, ALT significantly increased several days after initially improving post ERCP, sphincterotomy and lap chole. CT AP shows 2 indeterminate right hepatic lobe lesions, may represent small abscesses. Recommend abd MRI/MRCP, trend LFTs.  Lucio Edward, MD [facility] See AMION, [facility], for our on call provider

## 2022-04-05 NOTE — Progress Notes (Signed)
5 Days Post-Op   Subjective/Chief Complaint: NAEON. Denies pain. Tolerating diet  Granddaughter bedside  Objective: Vital signs in last 24 hours: Temp:  [97.4 F (36.3 C)-97.6 F (36.4 C)] 97.6 F (36.4 C) (01/21 2020) Pulse Rate:  [72-80] 80 (01/21 2020) Resp:  [16-20] 20 (01/21 2020) BP: (139-149)/(57-63) 139/57 (01/21 2020) SpO2:  [99 %-100 %] 99 % (01/21 2020) Last BM Date : 04/04/22  Intake/Output from previous day: 01/21 0701 - 01/22 0700 In: 100 [IV Piggyback:100] Out: 2092 [Urine:2090; Stool:2] Intake/Output this shift: No intake/output data recorded.  Gen: laying comfortably in bed. NAD Lungs: resp effort unlabored on room air Abd: soft, NT, port sites c/d/I MSK: calves soft and NT  Lab Results:  Recent Labs    04/04/22 0256 04/05/22 0350  WBC 21.5* 17.9*  HGB 10.1* 8.9*  HCT 28.7* 25.2*  PLT 120* 141*    BMET Recent Labs    04/04/22 0256 04/05/22 0350  NA 130* 131*  K 3.6 3.7  CL 93* 94*  CO2 26 26  GLUCOSE 153* 128*  BUN 15 14  CREATININE 0.86 0.81  CALCIUM 7.2* 7.3*    PT/INR No results for input(s): "LABPROT", "INR" in the last 72 hours. ABG No results for input(s): "PHART", "HCO3" in the last 72 hours.  Invalid input(s): "PCO2", "PO2"  Studies/Results: CT ABDOMEN PELVIS W CONTRAST  Result Date: 04/04/2022 CLINICAL DATA:  Abdominal pain.  Postop cholecystectomy. EXAM: CT ABDOMEN AND PELVIS WITH CONTRAST TECHNIQUE: Multidetector CT imaging of the abdomen and pelvis was performed using the standard protocol following bolus administration of intravenous contrast. RADIATION DOSE REDUCTION: This exam was performed according to the departmental dose-optimization program which includes automated exposure control, adjustment of the mA and/or kV according to patient size and/or use of iterative reconstruction technique. CONTRAST:  113m OMNIPAQUE IOHEXOL 300 MG/ML  SOLN COMPARISON:  03/27/2022, 11/21/2020, MRI 03/28/2022 FINDINGS: Lower chest: 3  mm nodule over the right middle lobe unchanged. Calcified mediastinal and right hilar lymph nodes. New moderate size bilateral pleural effusions with associated bibasilar atelectasis. Calcified plaque over the descending thoracic aorta. Heart is normal size. Hepatobiliary: Postsurgical change compatible with recent cholecystectomy. Minimal pneumobilia over the central and left liver. Air over the common bile duct. Mild diffuse low-attenuation of the liver with geographic peripheral areas of slight increased density possibly due to differential perfusion. Two small peripheral round/oval hypodensities over the right lobe of the liver with the larger measuring 1.6 cm as these were not seen on previous CTs, although present on recent MRI. These are indeterminate and may represent small abscesses. Pancreas: Normal. Spleen: Multiple calcified granulomas, otherwise unremarkable. Adrenals/Urinary Tract: Adrenal glands are normal. Kidneys are normal in size without hydronephrosis or nephrolithiasis. Bladder is decompressed with presence of Foley catheter. Visualized ureters are unremarkable. Stomach/Bowel: Stomach and small bowel are normal. Appendix is unremarkable. Colon is unremarkable. Vascular/Lymphatic: Mild calcified plaque over the abdominal aorta which is normal in caliber. Remaining vascular structures are unremarkable. No adenopathy. Reproductive: Normal. Other: Mild free fluid in the abdomen/pelvis likely postsurgical. Mild diffuse subcutaneous edema over the abdominal/pelvic wall. Musculoskeletal: No focal abnormality. IMPRESSION: 1. Postsurgical change compatible with recent cholecystectomy. Two small peripheral round/oval hypodensities over the right lobe of the liver with the larger measuring 1.6 cm as these were not seen on previous CTS, although present on recent MRI. These are indeterminate and may represent small abscesses. Recommend attention on follow-up. 2. New moderate size bilateral pleural effusions  with associated bibasilar atelectasis. 3. Aortic atherosclerosis. Aortic  Atherosclerosis (ICD10-I70.0). Electronically Signed   By: Marin Olp M.D.   On: 04/04/2022 13:36   DG Chest 2 View  Result Date: 04/04/2022 CLINICAL DATA:  Leukocytosis.  Prior laparoscopic cholecystectomy. EXAM: CHEST - 2 VIEW COMPARISON:  None FINDINGS: Central venous catheter tip projects over the superior vena cava. Monitoring leads overlie the patient. Cardiac contours upper limits of normal. Probable small to moderate layering bilateral pleural effusions and underlying consolidation. No pneumothorax. Thoracic spine degenerative changes. Probable calcified nodes within the mediastinum. IMPRESSION: Probable small to moderate layering bilateral pleural effusions with underlying consolidation. Electronically Signed   By: Lovey Newcomer M.D.   On: 04/04/2022 13:22    Anti-infectives: Anti-infectives (From admission, onward)    Start     Dose/Rate Route Frequency Ordered Stop   04/05/22 0900  piperacillin-tazobactam (ZOSYN) IVPB 3.375 g        3.375 g 12.5 mL/hr over 240 Minutes Intravenous Every 8 hours 04/05/22 0745     04/02/22 2000  amoxicillin-clavulanate (AUGMENTIN) 875-125 MG per tablet 1 tablet  Status:  Discontinued        1 tablet Oral Every 12 hours 04/02/22 1202 04/05/22 0745   03/29/22 2100  piperacillin-tazobactam (ZOSYN) IVPB 3.375 g  Status:  Discontinued        3.375 g 12.5 mL/hr over 240 Minutes Intravenous Every 8 hours 03/28/22 2051 03/29/22 0947   03/29/22 1000  piperacillin-tazobactam (ZOSYN) IVPB 3.375 g  Status:  Discontinued        3.375 g 12.5 mL/hr over 240 Minutes Intravenous Every 8 hours 03/29/22 0947 04/02/22 1202   03/29/22 0800  cefTRIAXone (ROCEPHIN) 2 g in sodium chloride 0.9 % 100 mL IVPB  Status:  Discontinued        2 g 200 mL/hr over 30 Minutes Intravenous Every 24 hours 03/28/22 1058 03/28/22 1212   03/29/22 0800  cefTRIAXone (ROCEPHIN) 1 g in sodium chloride 0.9 % 100 mL IVPB   Status:  Discontinued        1 g 200 mL/hr over 30 Minutes Intravenous Every 24 hours 03/28/22 1212 03/28/22 1603   03/28/22 1700  piperacillin-tazobactam (ZOSYN) IVPB 3.375 g  Status:  Discontinued        3.375 g 12.5 mL/hr over 240 Minutes Intravenous Every 8 hours 03/28/22 1603 03/28/22 2051   03/28/22 1115  metroNIDAZOLE (FLAGYL) IVPB 500 mg  Status:  Discontinued        500 mg 100 mL/hr over 60 Minutes Intravenous 2 times daily 03/28/22 1058 03/28/22 1609   03/28/22 0645  cefTRIAXone (ROCEPHIN) 1 g in sodium chloride 0.9 % 100 mL IVPB        1 g 200 mL/hr over 30 Minutes Intravenous  Once 03/28/22 0644 03/28/22 0801       Assessment/Plan: s/p Procedure(s): LAPAROSCOPIC CHOLECYSTECTOMY (N/A) POD 4  s/p laparoscopic partial cholecystectomy 1/17 AR - WBC 17.9 (21), transaminases continue to increase today. T bili down to 1.8  - CT scan abdomen pelvis 1/21 with 2 new indeterminate hypodensities of the liver. GI following as well - advance soft but go slowly, cont ensure - IS, PT/OT, mobilize   FEN: soft ID: [email or web address] VTE: SCDs, hold LMWH for thrombocytopenia   LOS: 8 days   Winferd Humphrey, [facility] 04/05/2022, 9:00 AM Please see Amion for pager number during day hours 7:00am-4:30pm

## 2022-04-06 ENCOUNTER — Inpatient Hospital Stay (HOSPITAL_BASED_OUTPATIENT_CLINIC_OR_DEPARTMENT_OTHER): Payer: Medicare Other

## 2022-04-06 DIAGNOSIS — N390 Urinary tract infection, site not specified: Secondary | ICD-10-CM | POA: Diagnosis not present

## 2022-04-06 DIAGNOSIS — K8309 Other cholangitis: Secondary | ICD-10-CM | POA: Diagnosis not present

## 2022-04-06 DIAGNOSIS — R1310 Dysphagia, unspecified: Secondary | ICD-10-CM | POA: Diagnosis not present

## 2022-04-06 DIAGNOSIS — R7989 Other specified abnormal findings of blood chemistry: Secondary | ICD-10-CM | POA: Diagnosis not present

## 2022-04-06 DIAGNOSIS — K810 Acute cholecystitis: Secondary | ICD-10-CM | POA: Diagnosis not present

## 2022-04-06 DIAGNOSIS — K750 Abscess of liver: Secondary | ICD-10-CM | POA: Diagnosis not present

## 2022-04-06 DIAGNOSIS — N182 Chronic kidney disease, stage 2 (mild): Secondary | ICD-10-CM | POA: Diagnosis not present

## 2022-04-06 LAB — COMPREHENSIVE METABOLIC PANEL
ALT: 980 U/L — ABNORMAL HIGH (ref 0–44)
AST: 1256 U/L — ABNORMAL HIGH (ref 15–41)
Albumin: 2.7 g/dL — ABNORMAL LOW (ref 3.5–5.0)
Alkaline Phosphatase: 79 U/L (ref 38–126)
Anion gap: 10 (ref 5–15)
BUN: 11 mg/dL (ref 8–23)
CO2: 26 mmol/L (ref 22–32)
Calcium: 7.5 mg/dL — ABNORMAL LOW (ref 8.9–10.3)
Chloride: 96 mmol/L — ABNORMAL LOW (ref 98–111)
Creatinine, Ser: 0.74 mg/dL (ref 0.44–1.00)
GFR, Estimated: >60 mL/min (ref >60)
Glucose, Bld: 125 mg/dL — ABNORMAL HIGH (ref 70–99)
Potassium: 2.9 mmol/L — ABNORMAL LOW (ref 3.5–5.1)
Sodium: 132 mmol/L — ABNORMAL LOW (ref 135–145)
Total Bilirubin: 1.6 mg/dL — ABNORMAL HIGH (ref 0.3–1.2)
Total Protein: 5.1 g/dL — ABNORMAL LOW (ref 6.5–8.1)

## 2022-04-06 LAB — CBC WITH DIFFERENTIAL/PLATELET
Abs Immature Granulocytes: 0.26 10*3/uL — ABNORMAL HIGH (ref 0.00–0.07)
Basophils Absolute: 0.1 10*3/uL (ref 0.0–0.1)
Basophils Relative: 1 %
Eosinophils Absolute: 0.1 10*3/uL (ref 0.0–0.5)
Eosinophils Relative: 1 %
HCT: 23.3 % — ABNORMAL LOW (ref 36.0–46.0)
Hemoglobin: 8.3 g/dL — ABNORMAL LOW (ref 12.0–15.0)
Immature Granulocytes: 3 %
Lymphocytes Relative: 15 %
Lymphs Abs: 1.4 10*3/uL (ref 0.7–4.0)
MCH: 30.6 pg (ref 26.0–34.0)
MCHC: 35.6 g/dL (ref 30.0–36.0)
MCV: 86 fL (ref 80.0–100.0)
Monocytes Absolute: 0.5 10*3/uL (ref 0.1–1.0)
Monocytes Relative: 5 %
Neutro Abs: 7.4 10*3/uL (ref 1.7–7.7)
Neutrophils Relative %: 75 %
Platelets: 148 10*3/uL — ABNORMAL LOW (ref 150–400)
RBC: 2.71 MIL/uL — ABNORMAL LOW (ref 3.87–5.11)
RDW: 15.6 % — ABNORMAL HIGH (ref 11.5–15.5)
WBC: 9.7 10*3/uL (ref 4.0–10.5)
nRBC: 0 % (ref 0.0–0.2)

## 2022-04-06 LAB — GLUCOSE, CAPILLARY
Glucose-Capillary: 118 mg/dL — ABNORMAL HIGH (ref 70–99)
Glucose-Capillary: 145 mg/dL — ABNORMAL HIGH (ref 70–99)
Glucose-Capillary: 158 mg/dL — ABNORMAL HIGH (ref 70–99)
Glucose-Capillary: 106 mg/dL — ABNORMAL HIGH (ref 70–99)

## 2022-04-06 LAB — MAGNESIUM: Magnesium: 1.5 mg/dL — ABNORMAL LOW (ref 1.7–2.4)

## 2022-04-06 LAB — LIPASE, BLOOD: Lipase: 362 U/L — ABNORMAL HIGH (ref 11–51)

## 2022-04-06 MED ORDER — BISACODYL 5 MG PO TBEC
5.0000 mg | DELAYED_RELEASE_TABLET | Freq: Once | ORAL | Status: AC
  Administered 2022-04-06: 5 mg via ORAL
  Filled 2022-04-06: qty 1

## 2022-04-06 MED ORDER — MELATONIN 5 MG PO TABS: 5.0000 mg | ORAL_TABLET | Freq: Every evening | ORAL | Status: DC | PRN

## 2022-04-06 MED ORDER — PHYTONADIONE 5 MG PO TABS
5.0000 mg | ORAL_TABLET | Freq: Once | ORAL | Status: AC
  Administered 2022-04-06: 5 mg via ORAL
  Filled 2022-04-06: qty 1

## 2022-04-06 MED ORDER — ENOXAPARIN SODIUM 40 MG/0.4ML IJ SOSY
40.0000 mg | PREFILLED_SYRINGE | INTRAMUSCULAR | Status: DC
  Administered 2022-04-06 – 2022-04-09 (×4): 40 mg via SUBCUTANEOUS
  Filled 2022-04-06 (×4): qty 0.4

## 2022-04-06 MED ORDER — ALBUMIN HUMAN 25 % IV SOLN: 25.0000 g | Freq: Once | INTRAVENOUS | Status: DC

## 2022-04-06 MED ORDER — FUROSEMIDE 10 MG/ML IJ SOLN
20.0000 mg | Freq: Two times a day (BID) | INTRAMUSCULAR | Status: AC
  Administered 2022-04-06 – 2022-04-07 (×3): 20 mg via INTRAVENOUS
  Filled 2022-04-06 (×3): qty 2

## 2022-04-06 MED ORDER — POTASSIUM CHLORIDE CRYS ER 10 MEQ PO TBCR
40.0000 meq | EXTENDED_RELEASE_TABLET | ORAL | Status: AC
  Administered 2022-04-06 (×3): 40 meq via ORAL
  Filled 2022-04-06 (×3): qty 4

## 2022-04-06 MED ORDER — MAGNESIUM SULFATE 4 GM/100ML IV SOLN
4.0000 g | Freq: Once | INTRAVENOUS | Status: AC
  Administered 2022-04-06: 4 g via INTRAVENOUS
  Filled 2022-04-06: qty 100

## 2022-04-06 MED ORDER — ALBUMIN HUMAN 25 % IV SOLN
25.0000 g | Freq: Two times a day (BID) | INTRAVENOUS | Status: AC
  Administered 2022-04-06 (×2): 25 g via INTRAVENOUS
  Filled 2022-04-06 (×2): qty 100

## 2022-04-06 NOTE — Progress Notes (Addendum)
Progress Note  Primary GI: Dr. Hilarie Fredrickson   Subjective  Chief Complaint: Admitted initially for cholangitis, status post ERCP 01/15 and lap chole 01/17 patient was having initial downtrending LFTs,  consulted again due to liver function elevation  Granddaughter Apolonio Schneiders is in the room and translates. Patient was having some right lower quadrant abdominal discomfort near incision site, this is resolved.  No nausea or vomiting.   Has been having some coughing with liquids and her own spit, better in upright position had speech therapy consult today. Last bowel movement day before yesterday, possibly very small bowel movement yesterday.   States she feels improved overall.    Objective   Vital signs in last 24 hours: Temp:  [97.9 F (36.6 C)-98.4 F (36.9 C)] 97.9 F (36.6 C) (01/23 0835) Pulse Rate:  [63-79] 63 (01/23 0835) Resp:  [15-18] 18 (01/23 0835) BP: (136-159)/(55-64) 153/57 (01/23 0835) SpO2:  [98 %-100 %] 98 % (01/23 0835) Last BM Date : 04/05/22 Last BM recorded by nurses in past 5 days Stool Type: Type 7 (Liquid consistency with no solid pieces) (04/04/2022  8:00 PM)  General:   female in no acute distress  Heart:  Regular rate and rhythm; no murmurs Pulm: Clear anteriorly; no wheezing Abdomen:  Soft, Non-distended AB, Active bowel sounds. No tenderness , No organomegaly appreciated.  Well-healed right lower incision. Extremities:  without  edema. Neurologic:  Alert and  oriented x4;  No focal deficits.  Psych:  Cooperative. Normal mood and affect.  Intake/Output from previous day: 01/22 0701 - 01/23 0700 In: 350.1 [P.O.:120; I.V.:30; IV Piggyback:200.1] Out: 3375 [Urine:3375] Intake/Output this shift: Total I/O In: -  Out: 1800 [Urine:1800]  Studies/Results: MR ABDOMEN MRCP W WO CONTAST  Result Date: 04/06/2022 CLINICAL DATA:  82 year old female with history of elevated liver function tests. Status post cholecystectomy. EXAM: MRI ABDOMEN WITHOUT AND WITH  CONTRAST (INCLUDING MRCP) TECHNIQUE: Multiplanar multisequence MR imaging of the abdomen was performed both before and after the administration of intravenous contrast. Heavily T2-weighted images of the biliary and pancreatic ducts were obtained, and three-dimensional MRCP images were rendered by post processing. CONTRAST:  8m GADAVIST GADOBUTROL 1 MMOL/ML IV SOLN COMPARISON:  Abdominal MRI/MRCP 03/28/2022. CT of the abdomen and pelvis 04/04/2022. FINDINGS: Lower chest: Moderate bilateral pleural effusions lying dependently. Extensive passive subsegmental atelectasis noted in the lower lobes of the lungs bilaterally. Hepatobiliary: Scattered areas of loss of signal intensity throughout the hepatic parenchyma on out of phase dual echo images, indicative of heterogeneous hepatic steatosis. Heterogeneous perfusion of the liver noted during early post gadolinium phases, which normalize on delayed post gadolinium phases. In the periphery of the right lobe of the liver there are 2 lesions (axial image 43 of series 21) which demonstrate peripheral rim hyperenhancement and central hypoenhancement on post gadolinium imaging. The largest of these is in the inferior aspect of segment 5 (axial image 21 of series 7 and coronal image 10 of series 6) measures 2.2 x 1.6 x 2.1 cm, and is centrally slightly low T1 signal intensity, heterogeneous T2 signal intensity, with peripheral rim enhancement and diffusion restriction, larger than the prior study, compatible with an intrahepatic abscess. The smaller lesion is less well-defined (best appreciated on axial image 43 of series 21) measuring 1.1 x 0.6 cm, also in segment 5, demonstrating T1 hypointensity, mild diffuse T2 hyperintensity, and peripheral enhancement on post gadolinium imaging, also likely a small abscess. No other definite hepatic lesions are confidently identified. Pancreas: No pancreatic mass. No pancreatic  ductal dilatation on MRCP images. No pancreatic or  peripancreatic fluid collections or inflammatory changes. Spleen:  Unremarkable. Adrenals/Urinary Tract: 5 mm T1 hypointense, T2 hyperintense, nonenhancing lesion in the anterior aspect of the interpolar region of the left kidney, compatible with a simple (Bosniak class 1) cyst, which requires no imaging follow-up. Right kidney and bilateral adrenal glands are otherwise normal in appearance. No hydroureteronephrosis in the visualized portions of the abdomen. Stomach/Bowel: Visualized portions are unremarkable. Vascular/Lymphatic: No aneurysm identified in the visualized abdominal vasculature. No lymphadenopathy noted in the abdomen. Other:  Trace volume of perihepatic ascites. Musculoskeletal: No aggressive appearing osseous lesions are noted in the visualized portions of the skeleton. Diffuse body wall edema. IMPRESSION: 1. Two lesions in segment 5 of the liver, as above, with imaging characteristics compatible with intrahepatic abscesses. 2. Heterogeneous hepatic steatosis. 3. Moderate bilateral pleural effusions with extensive passive atelectasis in the lower lobes of the lungs bilaterally. 4. A trace volume of perihepatic ascites. 5. Diffuse body wall edema. 6. Additional incidental findings, as above. Electronically Signed   By: Vinnie Langton M.D.   On: 04/06/2022 08:17   MR 3D Recon At Scanner  Result Date: 04/06/2022 CLINICAL DATA:  82 year old female with history of elevated liver function tests. Status post cholecystectomy. EXAM: MRI ABDOMEN WITHOUT AND WITH CONTRAST (INCLUDING MRCP) TECHNIQUE: Multiplanar multisequence MR imaging of the abdomen was performed both before and after the administration of intravenous contrast. Heavily T2-weighted images of the biliary and pancreatic ducts were obtained, and three-dimensional MRCP images were rendered by post processing. CONTRAST:  46m GADAVIST GADOBUTROL 1 MMOL/ML IV SOLN COMPARISON:  Abdominal MRI/MRCP 03/28/2022. CT of the abdomen and pelvis  04/04/2022. FINDINGS: Lower chest: Moderate bilateral pleural effusions lying dependently. Extensive passive subsegmental atelectasis noted in the lower lobes of the lungs bilaterally. Hepatobiliary: Scattered areas of loss of signal intensity throughout the hepatic parenchyma on out of phase dual echo images, indicative of heterogeneous hepatic steatosis. Heterogeneous perfusion of the liver noted during early post gadolinium phases, which normalize on delayed post gadolinium phases. In the periphery of the right lobe of the liver there are 2 lesions (axial image 43 of series 21) which demonstrate peripheral rim hyperenhancement and central hypoenhancement on post gadolinium imaging. The largest of these is in the inferior aspect of segment 5 (axial image 21 of series 7 and coronal image 10 of series 6) measures 2.2 x 1.6 x 2.1 cm, and is centrally slightly low T1 signal intensity, heterogeneous T2 signal intensity, with peripheral rim enhancement and diffusion restriction, larger than the prior study, compatible with an intrahepatic abscess. The smaller lesion is less well-defined (best appreciated on axial image 43 of series 21) measuring 1.1 x 0.6 cm, also in segment 5, demonstrating T1 hypointensity, mild diffuse T2 hyperintensity, and peripheral enhancement on post gadolinium imaging, also likely a small abscess. No other definite hepatic lesions are confidently identified. Pancreas: No pancreatic mass. No pancreatic ductal dilatation on MRCP images. No pancreatic or peripancreatic fluid collections or inflammatory changes. Spleen:  Unremarkable. Adrenals/Urinary Tract: 5 mm T1 hypointense, T2 hyperintense, nonenhancing lesion in the anterior aspect of the interpolar region of the left kidney, compatible with a simple (Bosniak class 1) cyst, which requires no imaging follow-up. Right kidney and bilateral adrenal glands are otherwise normal in appearance. No hydroureteronephrosis in the visualized portions of  the abdomen. Stomach/Bowel: Visualized portions are unremarkable. Vascular/Lymphatic: No aneurysm identified in the visualized abdominal vasculature. No lymphadenopathy noted in the abdomen. Other:  Trace volume of perihepatic  ascites. Musculoskeletal: No aggressive appearing osseous lesions are noted in the visualized portions of the skeleton. Diffuse body wall edema. IMPRESSION: 1. Two lesions in segment 5 of the liver, as above, with imaging characteristics compatible with intrahepatic abscesses. 2. Heterogeneous hepatic steatosis. 3. Moderate bilateral pleural effusions with extensive passive atelectasis in the lower lobes of the lungs bilaterally. 4. A trace volume of perihepatic ascites. 5. Diffuse body wall edema. 6. Additional incidental findings, as above. Electronically Signed   By: Vinnie Langton M.D.   On: 04/06/2022 08:17   DG Chest 2 View  Result Date: 04/04/2022 CLINICAL DATA:  Leukocytosis.  Prior laparoscopic cholecystectomy. EXAM: CHEST - 2 VIEW COMPARISON:  None FINDINGS: Central venous catheter tip projects over the superior vena cava. Monitoring leads overlie the patient. Cardiac contours upper limits of normal. Probable small to moderate layering bilateral pleural effusions and underlying consolidation. No pneumothorax. Thoracic spine degenerative changes. Probable calcified nodes within the mediastinum. IMPRESSION: Probable small to moderate layering bilateral pleural effusions with underlying consolidation. Electronically Signed   By: Lovey Newcomer M.D.   On: 04/04/2022 13:22    Lab Results: Recent Labs    04/04/22 0256 04/05/22 0350 04/06/22 0408  WBC 21.5* 17.9* 9.7  HGB 10.1* 8.9* 8.3*  HCT 28.7* 25.2* 23.3*  PLT 120* 141* 148*   BMET Recent Labs    04/04/22 0256 04/05/22 0350 04/06/22 0408  NA 130* 131* 132*  K 3.6 3.7 2.9*  CL 93* 94* 96*  CO2 '26 26 26  '$ GLUCOSE 153* 128* 125*  BUN '15 14 11  '$ CREATININE 0.86 0.81 0.74  CALCIUM 7.2* 7.3* 7.5*   LFT Recent  Labs    04/06/22 0408  PROT 5.1*  ALBUMIN 2.7*  AST 1,256*  ALT 980*  ALKPHOS 79  BILITOT 1.6*   PT/INR Recent Labs    04/05/22 1400  LABPROT 17.6*  INR 1.5*     Scheduled Meds:  acetaminophen  1,000 mg Oral Q6H   amoxicillin-clavulanate  1 tablet Oral Q12H   Chlorhexidine Gluconate Cloth  6 each Topical Q2200   enoxaparin (LOVENOX) injection  40 mg Subcutaneous Q24H   famotidine  20 mg Oral QHS   furosemide  20 mg Intravenous Q12H   insulin aspart  0-9 Units Subcutaneous TID WC   levothyroxine  75 mcg Oral Q0600   lip balm   Topical BID   polyethylene glycol  17 g Oral Daily   potassium chloride  40 mEq Oral Q4H   sodium chloride flush  10-40 mL Intracatheter Q12H   Continuous Infusions:  sodium chloride Stopped (03/29/22 1113)   albumin human 25 g (04/06/22 0836)   magnesium sulfate bolus IVPB 4 g (04/06/22 1044)   methocarbamol (ROBAXIN) IV     ondansetron (ZOFRAN) IV        Patient profile:   82 year old [place] female history of aortic atherosclerosis, hyperlipidemia, type II DM, GERD, IDA, 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  S/p lap cholecystectomy 01/17 ID signed off, currently on Augmentin for 2 weeks Initially liver enzymes were improving but increased  Leukocytosis 04/06/2022 WBC 9.7  03/28/2022 Lactic Acid 2.3  Temp:  [97.9 F (36.6 C)-98.4 F (36.9 C)] 97.9 F (36.6  C) (01/23 0835) Pulse Rate:  [63-79] 63 (01/23 0835) Resp:  [15-18] 18 (01/23 0835) BP: (136-159)/(55-64) 153/57 (01/23 0835) SpO2:  [98 %-100 %] 98 % (01/23 0835) on Augmentin for E. coli UTI  blood cultures negative 2 days Chest x-ray unremarkable MRCP  confirmed 2 small liver abscesses -No leukocytosis, afebrile, downtrending LFTs, will reach out to ID to see if there is a better antibiotic on Augmentin.  Elevated LFTs Recent Labs  Lab 04/02/22 0449 04/03/22 0445 04/04/22 0256 04/05/22 0350 04/06/22 0408  AST 47* 144* 742* 1,983* 1,256*  ALT 149* 169* 517* 1,283* 980*  ALKPHOS 106 94 114 108 79  BILITOT 1.2 2.5* 2.3* 1.8* 1.6*  PROT 4.6* 5.1* 5.4* 5.0* 5.1*  ALBUMIN 2.3* 3.5 3.0* 2.7* 2.7*   Lab Results  Component Value Date   INR 1.5 (H) 04/05/2022   INR 1.0 02/15/2020   Down trending today.  negative hepatitis panel no evidence of hepatic encephalopathy on exam, INR 1.5, monitor daily CT abdomen pelvis with contrast showed 2 small peripheral round oval hypodensities right lobe of the liver largest measuring 1.6 indeterminate may represent small AB assesses recommend attention to follow-up  MRCP 2 lesion segment 5 of the liver with imaging characteristics compatible with intrahepatic abscesses, hepatic steatosis, moderate bilateral pleural effusions, trace volume.  Trace perihepatic ascites, diffuse body wall edema.   -Trend LFTs/INR.  -Avoid hepatotoxic medications -Further recommendations per Dr. Fuller Plan  Mild Coagulopathy 04/05/2022 INR 1.5 -Possible nutritional component we will do vitamin K  Chronic normocytic anemia Previously evaluated 2017 for IDA Anemia panel consistent with anemia of chronic disease No overt GI bleeding  Swallow eval today  for possible aspiration pneumonia   LOS: 9 days   Vladimir Crofts  04/06/2022, 12:38 PM   Attending Physician Note   I have taken an interval history, reviewed the chart and examined the patient. I performed a substantive portion of this encounter, including complete performance of at least one of the key components, in conjunction with the APP. I agree with the APP's note, impression and recommendations with my edits. My additional impressions and recommendations are  as follows.   Two intrahepatic abscesses noted on repeat MRI/MRCP. Elevated transaminases, etiology unclear, however LFTs are down trending. Discussed with ID and they recommend continuing Augmentin as previously planned. Trend LFTs.   Cholangitis, S/P ERCP sphincterotomy, resolved. Repeat MRCP shows no biliary abnormalities.   GI signing off. Available as needed. Outpatient GI follow up with Dr. Hilarie Fredrickson in 1 month.   Lucio Edward, MD [facility] See [id number], for our on call provider

## 2022-04-06 NOTE — Progress Notes (Signed)
PROGRESS NOTE    Lauren [name]  QXI:[id number] DOB: [birth year] DOA: 03/27/2022 PCP: Jani Gravel, MD    Chief Complaint  Patient presents with   Abdominal Pain    Brief Narrative:  82 year old woman admitted to hospital with abdominal pain, several days of poor p.o. intake found to have cholangitis versus cholecystitis.   History largely per son at bedside.  Several days of poor appetite.  Not eating very much.  Abdominal pain worsened after eating bacon.  Presented to the ED.  They are ultrasound with dilated bile duct, sludge, gallbladder thickening.  MRCP subsequently obtained.  This demonstrated concern for cholangitis versus cholecystitis.  Surgery and GI were consulted.  Felt cholecystitis was less likely, most likely cholangitis.  Plan for ERCP 1/15 which was concerning for ascending cholangitis and acute cholecystitis.  Patient noted on the afternoon of 03/28/2022 to be, more hypotensive.  Not responding to fluids.  Transferred to the stepdown unit.  Patient required pressors.  Labs notable for creatinine doubled.  LFTs essentially stable with rising T. bili.  General surgery assessed patient as well.  Patient weaned off pressors transferred to the floor and transferred to [facility] service.  Patient s/p laparoscopic cholecystectomy 03/31/2022. Patient also noted to be volume overloaded, placed on IV Lasix.  Patient with worsening transaminitis, CT was done, MRI abdomen done with concerns for intrahepatic abscesses.  GI and general surgery following.   Assessment & Plan:   Principal Problem:   Ascending cholangitis Active Problems:   HTN (hypertension)   Hyperlipidemia   Type 2 diabetes mellitus (HCC)   GERD (gastroesophageal reflux disease)   Hypothyroidism   Nausea without vomiting   Chronic kidney disease, stage 2 (mild)   Iron deficiency anemia   Recurrent urinary tract infection   Vertigo   Hepatitis   Hypokalemia   LFT elevation   Shock (HCC)   Metabolic acidosis   Acute  cholecystitis   Transaminitis   Anemia of chronic disease   Thrombocytopenia (HCC)   Septic shock (HCC)   Urinary tract infection without hematuria  #1 septic shock secondary to acute cholangitis and probable acute cholecystitis,/transaminitis.  POA -Patient noted during the hospitalization to be persistently hypotensive despite IV fluid resuscitation requiring pressors. -CT angiogram abdomen and pelvis done with no evidence of aortic aneurysm or dissection.  Aortic atherosclerosis.  Old granulomatous disease.  Hepatic steatosis. -Right upper quadrant ultrasound done with small amount of gallbladder sludge however no definite gallstones identified.  Mild diffuse gallbladder gallbladder wall thickening which is nonspecific.  Mild dilatation of CBD measuring 10 mm containing sludge. -Workup done including MRCP with mild gallbladder wall thickening, pericholecystic edema highly suspicious for acute cholecystitis.  Mild dilatation of central intrahepatic bile ducts and CBD to the level of the ampulla.  T2 hypointense/seen throughout the common bile duct without definite calculi.  Cholangitis cannot be excluded.  Small several subscapular wedge-shaped signal abnormalities and there are peripheral right and left hepatic lobes noted nonspecific.  Mild hepatic steatosis.  Mild perihepatic ascites. -ERCP done by GI concerning for ascending cholangitis based upon dark bile and purulent debris.  Concern for cholecystitis as well. -Patient slowly improving clinically, currently on IV Zosyn. -Patient seen by general surgery and patient s/p laparoscopic partial cholecystectomy on 03/31/2022 purulent fluid noted inside gallbladder.  -IV fluids discontinued.   -Continue current pain management. -Patient tolerated full liquid diet and diet being advanced to soft diet per general surgery which patient is tolerating..   -ID consulted and assessed patient  and IV Zosyn has been transitioned to oral Augmentin to complete  a 2-week course of antibiotic treatment from lap cholecystectomy with EOT of 04/13/2022.  Per ID recommendations.   -Patient now with fluctuating leukocytosis, worsening transaminitis which is slowly started to trend back down currently AST of 1256 from 1983, ALT of 984 from 1283. -Patient afebrile.  -Patient being treated for E. coli UTI on Augmentin. -Repeat blood cultures x 2 with no growth to date, repeat chest x-ray with moderate layering bilateral pleural effusions with underlying consolidation.  Patient with no respiratory symptoms.   -Repeat CT abdomen and pelvis done with postsurgical changes compatible recent cholecystectomy, 2 small peripheral round/oval hypodensities over the right lobe of the liver with the largest measuring 1.6 cm as these were not seen on previous CT, although present on recent MRI.  Indeterminate may be small abscesses.  New moderate-sized bilateral pleural effusion with associated bilateral basilar atelectasis.   -Lipase elevated. -MRCP with concerns for intrahepatic abscesses. -Continue diuresis with IV Lasix. -Continue Augmentin. -Will need outpatient follow-up with ID on 04/13/2022..  -Per general surgery and GI. -Mobilize.  2.  Metabolic acidosis -Likely second to acute infection. -Improved on bicarb drip. -Bicarb drip discontinued. -Continue empiric antibiotics.  3.  Anemia of chronic disease -Hemoglobin currently at 8.3, likely dilutional, patient with no overt bleeding. -Anemia panel consistent with anemia of chronic disease. -Follow H&H. -Transfusion hemoglobin < 7.  4.  Acute renal failure -Likely secondary to a prerenal azotemia and in the setting of ARB. -Urine output of 3.3 L over the past 24 hours after being placed on IV Lasix.  -Renal function improved. -Monitor renal function with diuresis.  5.  Hypokalemia/hypomagnesemia -Potassium intentionally repleted and potassium back down to 2.9 likely secondary to diuresis.  Magnesium of 1.5.    -Magnesium sulfate 4 g IV x 1, K-Dur 40 mEq p.o. every 4 hours x 3 doses.   -Repeat labs in the AM.   6.  Thrombocytopenia -Likely secondary to acute infection. -Patient with no overt bleeding. -Platelet count stable and slowly trending back up. -Follow. - liver function initially was improving with hydration, starting to trend back up.  -Patient with concerns for volume overload and as such IV fluids have been discontinued and patient status post IV Lasix.   7.  E. coli UTI -Was on IV Zosyn and transitioned to Augmentin per ID recommendations.   8.  Hypothyroidism -Synthroid.  9.  Hypertension -Continue to hold home antihypertensive medications as patient had presented with septic shock.  Blood pressure improved.  -Patient s/p IV Lasix.   -Patient with concerns for volume overload, new moderate-sized bilateral pleural effusions and is-placed on IV Lasix. -Follow blood pressure.   10.  Diabetes mellitus type 2 -Last hemoglobin A1c 6.0 on 02/15/2020. -Repeat hemoglobin A1c 6.3 (04/01/2022). -CBG 118 this morning. -Continue to hold home oral hypoglycemic agents.   -SSI.   11.  Hypocalcemia -Corrected calcium of 8.54 -Status post calcium gluconate 1 g IV x 3. -Vitamin D 25-hydroxy level at 41.82.   -Magnesium at 1.5. -Repeat labs in the AM.  12.  Volume overload -Patient noted to be volume overloaded on examination on 04/02/2022, decreased breath sounds in the bases, some scattered crackles, noted to have some swelling in the hands which has since improved, some lower extremity edema. -Patient with complaints of some shortness of breath on 04/02/2022. -Patient noted to be +8 L during this hospitalization. -Patient placed on IV Lasix as well as IV albumin with  urine output of 3.375 L over the past 24 hours.  Patient is now -5.871 L during this hospitalization.   -Improving clinically but still volume overloaded on examination.  -Repeat CT abdomen and pelvis done with new  moderate-sized bilateral pleural effusions with atelectasis. -Patient also with significant rise in LFTs. -Continue Lasix 20 mg IV every 12 hours x 4 doses in addition to IV albumin.   -Strict I's and O's, daily weights.  13.  Cough -Patient noted coughing when drinking water per granddaughter. -SLP assisted patient to rule out aspiration patient currently on a dysphagia 3 diet.     DVT prophylaxis: SCDs.  Postop DVT prophylaxis per general surgery. Code Status: Full Family Communication: Updated patient and granddaughter at bedside. Disposition: Likely home with home health once medically improved and stable.  Improvement with LFTs.  And cleared by GI and general surgery.  Status is: Inpatient Remains inpatient appropriate because: Severity of illness.   Consultants:  Gastroenterology: Dr. Benson Norway 03/28/2022 General surgery: Dr. Johney Maine 03/28/2022 PCCM: Dr. Silas Flood 03/28/2022 ID: Dr. Candiss Norse: 04/02/2022  Procedures:  ERCP with sphincterotomy/papillotomy per GI, Dr. Carlean Purl 03/29/2022 CT angiogram chest abdomen and pelvis 03/27/2022 MRCP 03/28/2022 Right upper quadrant ultrasound 03/28/2022 Laparoscopic partial cholecystectomy per Dr. Rosendo Gros, general surgery 03/31/2022 CT abdomen and pelvis 04/04/2022 MRCP 04/05/2022  Significant Hospital Events: Including procedures, antibiotic start and stop dates in addition to other pertinent events   03/28/2021 admitted to the hospital after a week of poor p.o. intake, with abdominal pain, likely cholangitis versus cholecystitis, on levophed 1/16 Off levophed,  Antimicrobials:  IV Rocephin 1/14/ 2024 x 1 dose IV Zosyn 03/28/2022>>>>>>> 04/02/2020 Augmentin 04/02/2022>>>> 04/14/2020   Subjective: Patient sleeping but easily arousable.  Denies any chest pain.  No significant shortness of breath.  Complaining of some intermittent right upper quadrant abdominal pain.  No nausea or vomiting.  Tolerating oral intake.  Granddaughter still feels patient is  aspirating.    Objective: Vitals:   04/05/22 1438 04/05/22 2111 04/06/22 0526 04/06/22 0835  BP: (!) 159/61 (!) 159/55 136/64 (!) 153/57  Pulse:  69 71 63  Resp: '18 15 17 18  '$ Temp: 98.4 F (36.9 C) 97.9 F (36.6 C) 98.1 F (36.7 C) 97.9 F (36.6 C)  TempSrc: Oral Oral Oral Oral  SpO2: 98% 99% 100% 98%  Height:        Intake/Output Summary (Last 24 hours) at 04/06/2022 1153 Last data filed at 04/06/2022 0544 Gross per 24 hour  Intake 270.12 ml  Output 2675 ml  Net -2404.88 ml    There were no vitals filed for this visit.  Examination:  General exam: NAD. Respiratory system: Decreased bibasilar breath sounds.  No crackles.  No wheezing.  Fair air movement.  Speaking in full sentences.  Cardiovascular system: RRR no murmurs rubs or gallops.  No JVD.  Trace to 1+ bilateral lower extremity edema. Gastrointestinal system: Abdomen is soft, mildly distended, tender to palpation right upper quadrant.  Decreased diffuse swelling.  Positive bowel sounds.  No rebound.  No guarding.  Incision sites c/d/I,  Central nervous system: Alert and oriented. No focal neurological deficits. Extremities: Trace to 1+ bilateral lower extremity edema. Skin: No rashes, lesions or ulcers Psychiatry: Judgement and insight appear normal. Mood & affect appropriate.     Data Reviewed: I have personally reviewed following labs and imaging studies  CBC: Recent Labs  Lab 04/02/22 0449 04/03/22 0445 04/04/22 0256 04/05/22 0350 04/06/22 0408  WBC 14.9* 15.3* 21.5* 17.9* 9.7  NEUTROABS 11.3* 11.9*  17.9* 14.8* 7.4  HGB 8.6* 8.9* 10.1* 8.9* 8.3*  HCT 24.6* 25.0* 28.7* 25.2* 23.3*  MCV 87.9 85.9 86.7 85.7 86.0  PLT 91* 76* 120* 141* 148*     Basic Metabolic Panel: Recent Labs  Lab 04/02/22 0449 04/03/22 0445 04/04/22 0256 04/05/22 0350 04/06/22 0408  NA 138 136 130* 131* 132*  K 2.8* 2.5* 3.6 3.7 2.9*  CL 99 95* 93* 94* 96*  CO2 '27 29 26 26 26  '$ GLUCOSE 149* 128* 153* 128* 125*  BUN 24*  '15 15 14 11  '$ CREATININE 0.83 0.78 0.86 0.81 0.74  CALCIUM 7.1* 7.9* 7.2* 7.3* 7.5*  MG 1.9 1.5* 2.1 1.8 1.5*     GFR: CrCl cannot be calculated (Unknown ideal weight.).  Liver Function Tests: Recent Labs  Lab 04/02/22 0449 04/03/22 0445 04/04/22 0256 04/05/22 0350 04/06/22 0408  AST 47* 144* 742* 1,983* 1,256*  ALT 149* 169* 517* 1,283* 980*  ALKPHOS 106 94 114 108 79  BILITOT 1.2 2.5* 2.3* 1.8* 1.6*  PROT 4.6* 5.1* 5.4* 5.0* 5.1*  ALBUMIN 2.3* 3.5 3.0* 2.7* 2.7*     CBG: Recent Labs  Lab 04/05/22 1123 04/05/22 1708 04/05/22 2108 04/06/22 0741 04/06/22 1122  GLUCAP 135* 142* 173* 118* 145*      Recent Results (from the past 240 hour(s))  Urine Culture     Status: Abnormal   Collection Time: 03/27/22  7:45 PM   Specimen: Urine, Clean Catch  Result Value Ref Range Status   Specimen Description   Final    URINE, CLEAN CATCH Performed at [facility], [address] [place], [place] [address]    Special Requests   Final    NONE Performed at [facility], [address] [place], [place] [address]    Culture >=100,000 COLONIES/mL ESCHERICHIA COLI (A)  Final   Report Status 03/30/2022 FINAL  Final   Organism ID, Bacteria ESCHERICHIA COLI (A)  Final      Susceptibility   Escherichia coli - MIC*    AMPICILLIN <=2 SENSITIVE Sensitive     CEFAZOLIN <=4 SENSITIVE Sensitive     CEFEPIME <=0.12 SENSITIVE Sensitive     CEFTRIAXONE <=0.25 SENSITIVE Sensitive     CIPROFLOXACIN <=0.25 SENSITIVE Sensitive     GENTAMICIN <=1 SENSITIVE Sensitive     IMIPENEM <=0.25 SENSITIVE Sensitive     NITROFURANTOIN <=16 SENSITIVE Sensitive     TRIMETH/SULFA <=20 SENSITIVE Sensitive     AMPICILLIN/SULBACTAM <=2 SENSITIVE Sensitive     PIP/TAZO <=4 SENSITIVE Sensitive     * >=100,000 COLONIES/mL ESCHERICHIA COLI  MRSA Next Gen by PCR, Nasal     Status: None   Collection Time: 03/28/22  9:39 PM   Specimen: Nasal Mucosa; Nasal Swab  Result Value Ref Range  Status   MRSA by PCR Next Gen NOT DETECTED NOT DETECTED Final    Comment: (NOTE) The GeneXpert MRSA Assay (FDA approved for NASAL specimens only), is one component of a comprehensive MRSA colonization surveillance program. It is not intended to diagnose MRSA infection nor to guide or monitor treatment for MRSA infections. Test performance is not FDA approved in patients less than 64 years old. Performed at [facility], [place] [address], [place], [place] [address]   Culture, blood (Routine X 2) w Reflex to ID Panel     Status: None (Preliminary result)   Collection Time: 04/04/22 10:27 AM   Specimen: BLOOD  Result Value Ref Range Status   Specimen  Description   Final    BLOOD LEFT ANTECUBITAL Performed at [facility] [address] [place], [place] [address]    Special Requests   Final    BOTTLES DRAWN AEROBIC ONLY Blood Culture adequate volume Performed at [facility] [address], [place], [place] [address]    Culture   Final    NO GROWTH 2 DAYS Performed at [facility] [address] [place], [place] [address]    Report Status PENDING  Incomplete  Culture, blood (Routine X 2) w Reflex to ID Panel     Status: None (Preliminary result)   Collection Time: 04/04/22 10:27 AM   Specimen: BLOOD  Result Value Ref Range Status   Specimen Description   Final    BLOOD RIGHT ANTECUBITAL Performed at [facility] [address] [place], [place] [address]    Special Requests   Final    BOTTLES DRAWN AEROBIC ONLY Blood Culture adequate volume Performed at [facility] [address], [place], [place] [address]    Culture   Final    NO GROWTH 2 DAYS Performed at [facility] [address], [place],  [address]    Report Status PENDING  Incomplete         Radiology Studies: MR ABDOMEN MRCP W WO CONTAST  Result Date: 04/06/2022 CLINICAL DATA:   82 year old female with history of elevated liver function tests. Status post cholecystectomy. EXAM: MRI ABDOMEN WITHOUT AND WITH CONTRAST (INCLUDING MRCP) TECHNIQUE: Multiplanar multisequence MR imaging of the abdomen was performed both before and after the administration of intravenous contrast. Heavily T2-weighted images of the biliary and pancreatic ducts were obtained, and three-dimensional MRCP images were rendered by post processing. CONTRAST:  30m GADAVIST GADOBUTROL 1 MMOL/ML IV SOLN COMPARISON:  Abdominal MRI/MRCP 03/28/2022. CT of the abdomen and pelvis 04/04/2022. FINDINGS: Lower chest: Moderate bilateral pleural effusions lying dependently. Extensive passive subsegmental atelectasis noted in the lower lobes of the lungs bilaterally. Hepatobiliary: Scattered areas of loss of signal intensity throughout the hepatic parenchyma on out of phase dual echo images, indicative of heterogeneous hepatic steatosis. Heterogeneous perfusion of the liver noted during early post gadolinium phases, which normalize on delayed post gadolinium phases. In the periphery of the right lobe of the liver there are 2 lesions (axial image 43 of series 21) which demonstrate peripheral rim hyperenhancement and central hypoenhancement on post gadolinium imaging. The largest of these is in the inferior aspect of segment 5 (axial image 21 of series 7 and coronal image 10 of series 6) measures 2.2 x 1.6 x 2.1 cm, and is centrally slightly low T1 signal intensity, heterogeneous T2 signal intensity, with peripheral rim enhancement and diffusion restriction, larger than the prior study, compatible with an intrahepatic abscess. The smaller lesion is less well-defined (best appreciated on axial image 43 of series 21) measuring 1.1 x 0.6 cm, also in segment 5, demonstrating T1 hypointensity, mild diffuse T2 hyperintensity, and peripheral enhancement on post gadolinium imaging, also likely a small abscess. No other definite hepatic lesions are  confidently identified. Pancreas: No pancreatic mass. No pancreatic ductal dilatation on MRCP images. No pancreatic or peripancreatic fluid collections or inflammatory changes. Spleen:  Unremarkable. Adrenals/Urinary Tract: 5 mm T1 hypointense, T2 hyperintense, nonenhancing lesion in the anterior aspect of the interpolar region of the left kidney, compatible with a simple (Bosniak class 1) cyst, which requires no imaging follow-up. Right kidney and bilateral adrenal glands are otherwise normal  in appearance. No hydroureteronephrosis in the visualized portions of the abdomen. Stomach/Bowel: Visualized portions are unremarkable. Vascular/Lymphatic: No aneurysm identified in the visualized abdominal vasculature. No lymphadenopathy noted in the abdomen. Other:  Trace volume of perihepatic ascites. Musculoskeletal: No aggressive appearing osseous lesions are noted in the visualized portions of the skeleton. Diffuse body wall edema. IMPRESSION: 1. Two lesions in segment 5 of the liver, as above, with imaging characteristics compatible with intrahepatic abscesses. 2. Heterogeneous hepatic steatosis. 3. Moderate bilateral pleural effusions with extensive passive atelectasis in the lower lobes of the lungs bilaterally. 4. A trace volume of perihepatic ascites. 5. Diffuse body wall edema. 6. Additional incidental findings, as above. Electronically Signed   By: Vinnie Langton M.D.   On: 04/06/2022 08:17   MR 3D Recon At Scanner  Result Date: 04/06/2022 CLINICAL DATA:  82 year old female with history of elevated liver function tests. Status post cholecystectomy. EXAM: MRI ABDOMEN WITHOUT AND WITH CONTRAST (INCLUDING MRCP) TECHNIQUE: Multiplanar multisequence MR imaging of the abdomen was performed both before and after the administration of intravenous contrast. Heavily T2-weighted images of the biliary and pancreatic ducts were obtained, and three-dimensional MRCP images were rendered by post processing. CONTRAST:  59m  GADAVIST GADOBUTROL 1 MMOL/ML IV SOLN COMPARISON:  Abdominal MRI/MRCP 03/28/2022. CT of the abdomen and pelvis 04/04/2022. FINDINGS: Lower chest: Moderate bilateral pleural effusions lying dependently. Extensive passive subsegmental atelectasis noted in the lower lobes of the lungs bilaterally. Hepatobiliary: Scattered areas of loss of signal intensity throughout the hepatic parenchyma on out of phase dual echo images, indicative of heterogeneous hepatic steatosis. Heterogeneous perfusion of the liver noted during early post gadolinium phases, which normalize on delayed post gadolinium phases. In the periphery of the right lobe of the liver there are 2 lesions (axial image 43 of series 21) which demonstrate peripheral rim hyperenhancement and central hypoenhancement on post gadolinium imaging. The largest of these is in the inferior aspect of segment 5 (axial image 21 of series 7 and coronal image 10 of series 6) measures 2.2 x 1.6 x 2.1 cm, and is centrally slightly low T1 signal intensity, heterogeneous T2 signal intensity, with peripheral rim enhancement and diffusion restriction, larger than the prior study, compatible with an intrahepatic abscess. The smaller lesion is less well-defined (best appreciated on axial image 43 of series 21) measuring 1.1 x 0.6 cm, also in segment 5, demonstrating T1 hypointensity, mild diffuse T2 hyperintensity, and peripheral enhancement on post gadolinium imaging, also likely a small abscess. No other definite hepatic lesions are confidently identified. Pancreas: No pancreatic mass. No pancreatic ductal dilatation on MRCP images. No pancreatic or peripancreatic fluid collections or inflammatory changes. Spleen:  Unremarkable. Adrenals/Urinary Tract: 5 mm T1 hypointense, T2 hyperintense, nonenhancing lesion in the anterior aspect of the interpolar region of the left kidney, compatible with a simple (Bosniak class 1) cyst, which requires no imaging follow-up. Right kidney and  bilateral adrenal glands are otherwise normal in appearance. No hydroureteronephrosis in the visualized portions of the abdomen. Stomach/Bowel: Visualized portions are unremarkable. Vascular/Lymphatic: No aneurysm identified in the visualized abdominal vasculature. No lymphadenopathy noted in the abdomen. Other:  Trace volume of perihepatic ascites. Musculoskeletal: No aggressive appearing osseous lesions are noted in the visualized portions of the skeleton. Diffuse body wall edema. IMPRESSION: 1. Two lesions in segment 5 of the liver, as above, with imaging characteristics compatible with intrahepatic abscesses. 2. Heterogeneous hepatic steatosis. 3. Moderate bilateral pleural effusions with extensive passive atelectasis in the lower lobes of the lungs bilaterally. 4. A  trace volume of perihepatic ascites. 5. Diffuse body wall edema. 6. Additional incidental findings, as above. Electronically Signed   By: Vinnie Langton M.D.   On: 04/06/2022 08:17   CT ABDOMEN PELVIS W CONTRAST  Result Date: 04/04/2022 CLINICAL DATA:  Abdominal pain.  Postop cholecystectomy. EXAM: CT ABDOMEN AND PELVIS WITH CONTRAST TECHNIQUE: Multidetector CT imaging of the abdomen and pelvis was performed using the standard protocol following bolus administration of intravenous contrast. RADIATION DOSE REDUCTION: This exam was performed according to the departmental dose-optimization program which includes automated exposure control, adjustment of the mA and/or kV according to patient size and/or use of iterative reconstruction technique. CONTRAST:  169m OMNIPAQUE IOHEXOL 300 MG/ML  SOLN COMPARISON:  03/27/2022, 11/21/2020, MRI 03/28/2022 FINDINGS: Lower chest: 3 mm nodule over the right middle lobe unchanged. Calcified mediastinal and right hilar lymph nodes. New moderate size bilateral pleural effusions with associated bibasilar atelectasis. Calcified plaque over the descending thoracic aorta. Heart is normal size. Hepatobiliary:  Postsurgical change compatible with recent cholecystectomy. Minimal pneumobilia over the central and left liver. Air over the common bile duct. Mild diffuse low-attenuation of the liver with geographic peripheral areas of slight increased density possibly due to differential perfusion. Two small peripheral round/oval hypodensities over the right lobe of the liver with the larger measuring 1.6 cm as these were not seen on previous CTs, although present on recent MRI. These are indeterminate and may represent small abscesses. Pancreas: Normal. Spleen: Multiple calcified granulomas, otherwise unremarkable. Adrenals/Urinary Tract: Adrenal glands are normal. Kidneys are normal in size without hydronephrosis or nephrolithiasis. Bladder is decompressed with presence of Foley catheter. Visualized ureters are unremarkable. Stomach/Bowel: Stomach and small bowel are normal. Appendix is unremarkable. Colon is unremarkable. Vascular/Lymphatic: Mild calcified plaque over the abdominal aorta which is normal in caliber. Remaining vascular structures are unremarkable. No adenopathy. Reproductive: Normal. Other: Mild free fluid in the abdomen/pelvis likely postsurgical. Mild diffuse subcutaneous edema over the abdominal/pelvic wall. Musculoskeletal: No focal abnormality. IMPRESSION: 1. Postsurgical change compatible with recent cholecystectomy. Two small peripheral round/oval hypodensities over the right lobe of the liver with the larger measuring 1.6 cm as these were not seen on previous CTS, although present on recent MRI. These are indeterminate and may represent small abscesses. Recommend attention on follow-up. 2. New moderate size bilateral pleural effusions with associated bibasilar atelectasis. 3. Aortic atherosclerosis. Aortic Atherosclerosis (ICD10-I70.0). Electronically Signed   By: DMarin OlpM.D.   On: 04/04/2022 13:36   DG Chest 2 View  Result Date: 04/04/2022 CLINICAL DATA:  Leukocytosis.  Prior laparoscopic  cholecystectomy. EXAM: CHEST - 2 VIEW COMPARISON:  None FINDINGS: Central venous catheter tip projects over the superior vena cava. Monitoring leads overlie the patient. Cardiac contours upper limits of normal. Probable small to moderate layering bilateral pleural effusions and underlying consolidation. No pneumothorax. Thoracic spine degenerative changes. Probable calcified nodes within the mediastinum. IMPRESSION: Probable small to moderate layering bilateral pleural effusions with underlying consolidation. Electronically Signed   By: DLovey NewcomerM.D.   On: 04/04/2022 13:22        Scheduled Meds:  acetaminophen  1,000 mg Oral Q6H   amoxicillin-clavulanate  1 tablet Oral Q12H   Chlorhexidine Gluconate Cloth  6 each Topical Q2200   enoxaparin (LOVENOX) injection  40 mg Subcutaneous Q24H   famotidine  20 mg Oral QHS   furosemide  20 mg Intravenous Q12H   insulin aspart  0-9 Units Subcutaneous TID WC   levothyroxine  75 mcg Oral Q0600   lip balm  Topical BID   polyethylene glycol  17 g Oral Daily   potassium chloride  40 mEq Oral Q4H   sodium chloride flush  10-40 mL Intracatheter Q12H   Continuous Infusions:  sodium chloride Stopped (03/29/22 1113)   albumin human 25 g (04/06/22 0836)   magnesium sulfate bolus IVPB 4 g (04/06/22 1044)   methocarbamol (ROBAXIN) IV     ondansetron (ZOFRAN) IV       LOS: 9 days    Time spent: 40 minutes    Irine Seal, MD [facility]   To contact the attending provider between 7A-7P or the covering provider during after hours 7P-7A, please log into the web site [email or web address] and access using universal [place] password for that web site. If you do not have the password, please call the hospital operator.  04/06/2022, 11:53 AM

## 2022-04-06 NOTE — Procedures (Addendum)
Objective Swallowing Evaluation: Type of Study: MBS-Modified Barium Swallow Study  (This study was not available on the [place] Work list, so full assessment posted under Progress Notes)   Patient Details  Name: Lauren [name] MRN: [id number] Date of Birth: 27-Feb-1941  Today's Date: 04/06/2022 Time: SLP Start Time (ACUTE ONLY): 1607 -SLP Stop Time (ACUTE ONLY): 3710  SLP Time Calculation (min) (ACUTE ONLY): 23 min   Past Medical History:  Past Medical History:  Diagnosis Date   Allergy    Aortic atherosclerosis ([place])    Arthritis    Diabetes mellitus    type 2    Diverticulosis    Dyspepsia    Fecal incontinence    Gastritis    GERD (gastroesophageal reflux disease)    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   Possible related to pyridium  G6PD studies pending   Hemorrhoids    Hiatal hernia    Hyperlipidemia    Hyperlipidemia    Hypertension    Hypothyroidism    IBS (irritable bowel syndrome)    Iron overload 07/13/2016   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   Osteoporosis    UTI (lower urinary tract infection)    Vitamin D deficiency    Past Surgical History:  Past Surgical History:  Procedure Laterality Date   CHOLECYSTECTOMY N/A 03/31/2022   Procedure: LAPAROSCOPIC CHOLECYSTECTOMY;  Surgeon: Ralene Ok, MD;  Location: [facility];  Service: General;  Laterality: N/A;   COLONOSCOPY     ERCP N/A 03/29/2022   Procedure: ENDOSCOPIC RETROGRADE CHOLANGIOPANCREATOGRAPHY (ERCP);  Surgeon: Gatha Mayer, MD;  Location: [facility];  Service: Gastroenterology;  Laterality: N/A;   PARTIAL KNEE ARTHROPLASTY Left 02/25/2020   Procedure: UNICOMPARTMENTAL KNEE;  Surgeon: Gaynelle Arabian, MD;  Location: [facility];  Service: Orthopedics;  Laterality: Left;  45mn   REMOVAL OF STONES  03/29/2022   Procedure: REMOVAL OF STONES;  Surgeon: GGatha Mayer MD;  [facility];  Service: Gastroenterology;;   SJoan Mayans 03/29/2022   Procedure: SJoan Mayans  Surgeon: GGatha Mayer MD;  Location: [facility];  Service: Gastroenterology;;   HPI: Patient is an 82y.o. female with PMH: OA, DM, GERD, HLD, HTN, hypothyroidism, osteoporosis, Lt UKA in 2021. She presented to the hospital on 03/27/22 with abdominal pain and several days of poor PO intake. She was found to have cholangitis vs cholecystitis. She underwent an ERCP on 1/15 and a laparoscopic cholecystectomy on 03/31/22. Her granddaughter reports intermittent coughing spells when eating/drinking.   Subjective: pleasant    Recommendations for follow up therapy are one component of a multi-disciplinary discharge planning process, led by the attending physician.  Recommendations may be updated based on patient status, additional functional criteria and insurance authorization.  Assessment / Plan / Recommendation     04/06/2022   12:00 PM  Clinical Impressions  Clinical Impression Pt presents with functional oropharyngeal swallow. She was reluctant to eat/drink, stating she was full and had just finished lunch. Granddaughter was present for interpretation.  She demonstrated some oral holding of POs, generally relating to her reluctance as opposed to a physiological impairment.  There was brisk onset of the swallow, normal pharyngeal stripping and base-of-tongue retraction (no residue), reliable laryngeal vestibule closure and no aspiration. There was only one instance of transient penetration of thin liquids into the vestibule when she was swallowing a pill (PAS [facility] 2[facility]. Discussed results with pt/granddaughter. No dysphagia identified. She may advance to regular solids or remain on dysphagia 3 while  admitted according to her preferences. May have thin liquids and can take pills whole with water. No further SLP f/u is needed,.  SLP Visit Diagnosis Dysphagia, unspecified (R13.10)  Impact on safety and function No limitations         04/06/2022   12:00 PM  Treatment Recommendations  Treatment  Recommendations No treatment recommended at this time        04/05/2022    3:53 PM  Prognosis  Prognosis for Safe Diet Advancement Good       04/06/2022   12:00 PM  Diet Recommendations  SLP Diet Recommendations Regular solids;Dysphagia 3 (Mech soft) solids;Thin liquid  Liquid Administration via Cup;Straw  Medication Administration Whole meds with liquid         04/06/2022   12:00 PM  Other Recommendations  Oral Care Recommendations Oral care BID  Follow Up Recommendations No SLP follow up       04/05/2022    3:53 PM  Frequency and Duration   Speech Therapy Frequency (ACUTE ONLY) min 1 x/week         04/06/2022   12:00 PM  Oral Phase  Oral Phase [facility]       04/06/2022   12:00 PM  Pharyngeal Phase  Pharyngeal Phase [facility]FL         No data to display           Juan Quam Laurice 04/06/2022, 1:45 PM

## 2022-04-06 NOTE — Progress Notes (Signed)
6 Days Post-Op   Subjective/Chief Complaint: Having a little RLQ pain with palpation this am but otherwise denies pain. Tolerating diet. No other complaints  Granddaughter bedside  Objective: Vital signs in last 24 hours: Temp:  [97.9 F (36.6 C)-98.4 F (36.9 C)] 98.1 F (36.7 C) (01/23 0526) Pulse Rate:  [69-86] 71 (01/23 0526) Resp:  [15-20] 17 (01/23 0526) BP: (136-159)/(53-64) 136/64 (01/23 0526) SpO2:  [97 %-100 %] 100 % (01/23 0526) Last BM Date : 04/05/22  Intake/Output from previous day: 01/22 0701 - 01/23 0700 In: 350.1 [P.O.:120; I.V.:30; IV Piggyback:200.1] Out: 7425 [Urine:3375] Intake/Output this shift: No intake/output data recorded.  Gen: laying comfortably in bed. NAD Lungs: resp effort unlabored on room air Abd: soft, port sites c/d/I, mild TTP RLQ without rebound or guarding  MSK: calves soft and NT  Lab Results:  Recent Labs    04/05/22 0350 04/06/22 0408  WBC 17.9* 9.7  HGB 8.9* 8.3*  HCT 25.2* 23.3*  PLT 141* 148*    BMET Recent Labs    04/05/22 0350 04/06/22 0408  NA 131* 132*  K 3.7 2.9*  CL 94* 96*  CO2 26 26  GLUCOSE 128* 125*  BUN 14 11  CREATININE 0.81 0.74  CALCIUM 7.3* 7.5*    PT/INR Recent Labs    04/05/22 1400  LABPROT 17.6*  INR 1.5*   ABG No results for input(s): "PHART", "HCO3" in the last 72 hours.  Invalid input(s): "PCO2", "PO2"  Studies/Results: MR ABDOMEN MRCP W WO CONTAST  Result Date: 04/06/2022 CLINICAL DATA:  82 year old female with history of elevated liver function tests. Status post cholecystectomy. EXAM: MRI ABDOMEN WITHOUT AND WITH CONTRAST (INCLUDING MRCP) TECHNIQUE: Multiplanar multisequence MR imaging of the abdomen was performed both before and after the administration of intravenous contrast. Heavily T2-weighted images of the biliary and pancreatic ducts were obtained, and three-dimensional MRCP images were rendered by post processing. CONTRAST:  24m GADAVIST GADOBUTROL 1 MMOL/ML IV SOLN  COMPARISON:  Abdominal MRI/MRCP 03/28/2022. CT of the abdomen and pelvis 04/04/2022. FINDINGS: Lower chest: Moderate bilateral pleural effusions lying dependently. Extensive passive subsegmental atelectasis noted in the lower lobes of the lungs bilaterally. Hepatobiliary: Scattered areas of loss of signal intensity throughout the hepatic parenchyma on out of phase dual echo images, indicative of heterogeneous hepatic steatosis. Heterogeneous perfusion of the liver noted during early post gadolinium phases, which normalize on delayed post gadolinium phases. In the periphery of the right lobe of the liver there are 2 lesions (axial image 43 of series 21) which demonstrate peripheral rim hyperenhancement and central hypoenhancement on post gadolinium imaging. The largest of these is in the inferior aspect of segment 5 (axial image 21 of series 7 and coronal image 10 of series 6) measures 2.2 x 1.6 x 2.1 cm, and is centrally slightly low T1 signal intensity, heterogeneous T2 signal intensity, with peripheral rim enhancement and diffusion restriction, larger than the prior study, compatible with an intrahepatic abscess. The smaller lesion is less well-defined (best appreciated on axial image 43 of series 21) measuring 1.1 x 0.6 cm, also in segment 5, demonstrating T1 hypointensity, mild diffuse T2 hyperintensity, and peripheral enhancement on post gadolinium imaging, also likely a small abscess. No other definite hepatic lesions are confidently identified. Pancreas: No pancreatic mass. No pancreatic ductal dilatation on MRCP images. No pancreatic or peripancreatic fluid collections or inflammatory changes. Spleen:  Unremarkable. Adrenals/Urinary Tract: 5 mm T1 hypointense, T2 hyperintense, nonenhancing lesion in the anterior aspect of the interpolar region of the left  kidney, compatible with a simple (Bosniak class 1) cyst, which requires no imaging follow-up. Right kidney and bilateral adrenal glands are otherwise  normal in appearance. No hydroureteronephrosis in the visualized portions of the abdomen. Stomach/Bowel: Visualized portions are unremarkable. Vascular/Lymphatic: No aneurysm identified in the visualized abdominal vasculature. No lymphadenopathy noted in the abdomen. Other:  Trace volume of perihepatic ascites. Musculoskeletal: No aggressive appearing osseous lesions are noted in the visualized portions of the skeleton. Diffuse body wall edema. IMPRESSION: 1. Two lesions in segment 5 of the liver, as above, with imaging characteristics compatible with intrahepatic abscesses. 2. Heterogeneous hepatic steatosis. 3. Moderate bilateral pleural effusions with extensive passive atelectasis in the lower lobes of the lungs bilaterally. 4. A trace volume of perihepatic ascites. 5. Diffuse body wall edema. 6. Additional incidental findings, as above. Electronically Signed   By: Vinnie Langton M.D.   On: 04/06/2022 08:17   MR 3D Recon At Scanner  Result Date: 04/06/2022 CLINICAL DATA:  82 year old female with history of elevated liver function tests. Status post cholecystectomy. EXAM: MRI ABDOMEN WITHOUT AND WITH CONTRAST (INCLUDING MRCP) TECHNIQUE: Multiplanar multisequence MR imaging of the abdomen was performed both before and after the administration of intravenous contrast. Heavily T2-weighted images of the biliary and pancreatic ducts were obtained, and three-dimensional MRCP images were rendered by post processing. CONTRAST:  28m GADAVIST GADOBUTROL 1 MMOL/ML IV SOLN COMPARISON:  Abdominal MRI/MRCP 03/28/2022. CT of the abdomen and pelvis 04/04/2022. FINDINGS: Lower chest: Moderate bilateral pleural effusions lying dependently. Extensive passive subsegmental atelectasis noted in the lower lobes of the lungs bilaterally. Hepatobiliary: Scattered areas of loss of signal intensity throughout the hepatic parenchyma on out of phase dual echo images, indicative of heterogeneous hepatic steatosis. Heterogeneous perfusion  of the liver noted during early post gadolinium phases, which normalize on delayed post gadolinium phases. In the periphery of the right lobe of the liver there are 2 lesions (axial image 43 of series 21) which demonstrate peripheral rim hyperenhancement and central hypoenhancement on post gadolinium imaging. The largest of these is in the inferior aspect of segment 5 (axial image 21 of series 7 and coronal image 10 of series 6) measures 2.2 x 1.6 x 2.1 cm, and is centrally slightly low T1 signal intensity, heterogeneous T2 signal intensity, with peripheral rim enhancement and diffusion restriction, larger than the prior study, compatible with an intrahepatic abscess. The smaller lesion is less well-defined (best appreciated on axial image 43 of series 21) measuring 1.1 x 0.6 cm, also in segment 5, demonstrating T1 hypointensity, mild diffuse T2 hyperintensity, and peripheral enhancement on post gadolinium imaging, also likely a small abscess. No other definite hepatic lesions are confidently identified. Pancreas: No pancreatic mass. No pancreatic ductal dilatation on MRCP images. No pancreatic or peripancreatic fluid collections or inflammatory changes. Spleen:  Unremarkable. Adrenals/Urinary Tract: 5 mm T1 hypointense, T2 hyperintense, nonenhancing lesion in the anterior aspect of the interpolar region of the left kidney, compatible with a simple (Bosniak class 1) cyst, which requires no imaging follow-up. Right kidney and bilateral adrenal glands are otherwise normal in appearance. No hydroureteronephrosis in the visualized portions of the abdomen. Stomach/Bowel: Visualized portions are unremarkable. Vascular/Lymphatic: No aneurysm identified in the visualized abdominal vasculature. No lymphadenopathy noted in the abdomen. Other:  Trace volume of perihepatic ascites. Musculoskeletal: No aggressive appearing osseous lesions are noted in the visualized portions of the skeleton. Diffuse body wall edema. IMPRESSION:  1. Two lesions in segment 5 of the liver, as above, with imaging characteristics compatible with intrahepatic  abscesses. 2. Heterogeneous hepatic steatosis. 3. Moderate bilateral pleural effusions with extensive passive atelectasis in the lower lobes of the lungs bilaterally. 4. A trace volume of perihepatic ascites. 5. Diffuse body wall edema. 6. Additional incidental findings, as above. Electronically Signed   By: Vinnie Langton M.D.   On: 04/06/2022 08:17   CT ABDOMEN PELVIS W CONTRAST  Result Date: 04/04/2022 CLINICAL DATA:  Abdominal pain.  Postop cholecystectomy. EXAM: CT ABDOMEN AND PELVIS WITH CONTRAST TECHNIQUE: Multidetector CT imaging of the abdomen and pelvis was performed using the standard protocol following bolus administration of intravenous contrast. RADIATION DOSE REDUCTION: This exam was performed according to the departmental dose-optimization program which includes automated exposure control, adjustment of the mA and/or kV according to patient size and/or use of iterative reconstruction technique. CONTRAST:  146m OMNIPAQUE IOHEXOL 300 MG/ML  SOLN COMPARISON:  03/27/2022, 11/21/2020, MRI 03/28/2022 FINDINGS: Lower chest: 3 mm nodule over the right middle lobe unchanged. Calcified mediastinal and right hilar lymph nodes. New moderate size bilateral pleural effusions with associated bibasilar atelectasis. Calcified plaque over the descending thoracic aorta. Heart is normal size. Hepatobiliary: Postsurgical change compatible with recent cholecystectomy. Minimal pneumobilia over the central and left liver. Air over the common bile duct. Mild diffuse low-attenuation of the liver with geographic peripheral areas of slight increased density possibly due to differential perfusion. Two small peripheral round/oval hypodensities over the right lobe of the liver with the larger measuring 1.6 cm as these were not seen on previous CTs, although present on recent MRI. These are indeterminate and may  represent small abscesses. Pancreas: Normal. Spleen: Multiple calcified granulomas, otherwise unremarkable. Adrenals/Urinary Tract: Adrenal glands are normal. Kidneys are normal in size without hydronephrosis or nephrolithiasis. Bladder is decompressed with presence of Foley catheter. Visualized ureters are unremarkable. Stomach/Bowel: Stomach and small bowel are normal. Appendix is unremarkable. Colon is unremarkable. Vascular/Lymphatic: Mild calcified plaque over the abdominal aorta which is normal in caliber. Remaining vascular structures are unremarkable. No adenopathy. Reproductive: Normal. Other: Mild free fluid in the abdomen/pelvis likely postsurgical. Mild diffuse subcutaneous edema over the abdominal/pelvic wall. Musculoskeletal: No focal abnormality. IMPRESSION: 1. Postsurgical change compatible with recent cholecystectomy. Two small peripheral round/oval hypodensities over the right lobe of the liver with the larger measuring 1.6 cm as these were not seen on previous CTS, although present on recent MRI. These are indeterminate and may represent small abscesses. Recommend attention on follow-up. 2. New moderate size bilateral pleural effusions with associated bibasilar atelectasis. 3. Aortic atherosclerosis. Aortic Atherosclerosis (ICD10-I70.0). Electronically Signed   By: DMarin OlpM.D.   On: 04/04/2022 13:36   DG Chest 2 View  Result Date: 04/04/2022 CLINICAL DATA:  Leukocytosis.  Prior laparoscopic cholecystectomy. EXAM: CHEST - 2 VIEW COMPARISON:  None FINDINGS: Central venous catheter tip projects over the superior vena cava. Monitoring leads overlie the patient. Cardiac contours upper limits of normal. Probable small to moderate layering bilateral pleural effusions and underlying consolidation. No pneumothorax. Thoracic spine degenerative changes. Probable calcified nodes within the mediastinum. IMPRESSION: Probable small to moderate layering bilateral pleural effusions with underlying  consolidation. Electronically Signed   By: DLovey NewcomerM.D.   On: 04/04/2022 13:22    Anti-infectives: Anti-infectives (From admission, onward)    Start     Dose/Rate Route Frequency Ordered Stop   04/05/22 1015  amoxicillin-clavulanate (AUGMENTIN) 875-125 MG per tablet 1 tablet        1 tablet Oral Every 12 hours 04/05/22 0922     04/05/22 0900  piperacillin-tazobactam (ZOSYN) IVPB  3.375 g  Status:  Discontinued        3.375 g 12.5 mL/hr over 240 Minutes Intravenous Every 8 hours 04/05/22 0745 04/05/22 0922   04/02/22 2000  amoxicillin-clavulanate (AUGMENTIN) 875-125 MG per tablet 1 tablet  Status:  Discontinued        1 tablet Oral Every 12 hours 04/02/22 1202 04/05/22 0745   03/29/22 2100  piperacillin-tazobactam (ZOSYN) IVPB 3.375 g  Status:  Discontinued        3.375 g 12.5 mL/hr over 240 Minutes Intravenous Every 8 hours 03/28/22 2051 03/29/22 0947   03/29/22 1000  piperacillin-tazobactam (ZOSYN) IVPB 3.375 g  Status:  Discontinued        3.375 g 12.5 mL/hr over 240 Minutes Intravenous Every 8 hours 03/29/22 0947 04/02/22 1202   03/29/22 0800  cefTRIAXone (ROCEPHIN) 2 g in sodium chloride 0.9 % 100 mL IVPB  Status:  Discontinued        2 g 200 mL/hr over 30 Minutes Intravenous Every 24 hours 03/28/22 1058 03/28/22 1212   03/29/22 0800  cefTRIAXone (ROCEPHIN) 1 g in sodium chloride 0.9 % 100 mL IVPB  Status:  Discontinued        1 g 200 mL/hr over 30 Minutes Intravenous Every 24 hours 03/28/22 1212 03/28/22 1603   03/28/22 1700  piperacillin-tazobactam (ZOSYN) IVPB 3.375 g  Status:  Discontinued        3.375 g 12.5 mL/hr over 240 Minutes Intravenous Every 8 hours 03/28/22 1603 03/28/22 2051   03/28/22 1115  metroNIDAZOLE (FLAGYL) IVPB 500 mg  Status:  Discontinued        500 mg 100 mL/hr over 60 Minutes Intravenous 2 times daily 03/28/22 1058 03/28/22 1609   03/28/22 0645  cefTRIAXone (ROCEPHIN) 1 g in sodium chloride 0.9 % 100 mL IVPB        1 g 200 mL/hr over 30 Minutes  Intravenous  Once 03/28/22 0644 03/28/22 0801       Assessment/Plan: s/p Procedure(s): LAPAROSCOPIC CHOLECYSTECTOMY (N/A) POD 5  s/p laparoscopic partial cholecystectomy 1/17 AR - WBC 9.7, transaminases down from yesterday. T bili down to 1.6  - CT scan abdomen pelvis 1/21 with 2 new indeterminate hypodensities of the liver. GI following and MRCP 1/22 with intrahepatic abscesses - cont dys 3 diet, cont ensure - IS, PT/OT, mobilize   FEN: dys 2 ID: [email or web address]. Now augmentin VTE: SCDs, thrombocytopenia improved. Start LMWH   LOS: 9 days   Winferd Humphrey, [facility] 04/06/2022, 8:26 AM Please see Amion for pager number during day hours 7:00am-4:30pm

## 2022-04-06 NOTE — Progress Notes (Signed)
Modified Barium Swallow Progress Note  Patient Details  Name: Lauren [name] MRN: [id number] Date of Birth: 1940/04/16  Today's Date: 04/06/2022  Modified Barium Swallow completed.  Full report located under Chart Review in the Imaging Section.  Brief recommendations include the following:  Clinical Impression  Pt presents with functional oropharyngeal swallow. She was reluctant to eat/drink, stating she was full and had just finished lunch. Granddaughter was present for interpretation.  She demonstrated some oral holding of POs, generally relating to her reluctance as opposed to a physiological impairment.  There was brisk onset of the swallow, normal pharyngeal stripping and base-of-tongue retraction (no residue), reliable laryngeal vestibule closure and no aspiration. There was only one instance of transient penetration of thin liquids into the vestibule when she was swallowing a pill (PAS score of 2, considered to be [facility]). Discussed results with pt/granddaughter. No dysphagia identified. She may advance to regular solids or remain on dysphagia 3 while admitted according to her preferences. May have thin liquids and can take pills whole with water. No further SLP f/u is needed,.   Swallow Evaluation Recommendations       SLP Diet Recommendations: Regular solids;Dysphagia 3 (Mech soft) solids;Thin liquid   Liquid Administration via: Cup;Straw   Medication Administration: Whole meds with liquid   Supervision: Patient able to self feed           Oral Care Recommendations: Oral care BID      [name] L. Tivis Ringer, MA CCC/SLP Clinical Specialist - Acute Care SLP Acute Rehabilitation Services Office number [phone]   Juan Quam Laurice 04/06/2022,12:58 PM

## 2022-04-07 DIAGNOSIS — N390 Urinary tract infection, site not specified: Secondary | ICD-10-CM | POA: Diagnosis not present

## 2022-04-07 DIAGNOSIS — R7989 Other specified abnormal findings of blood chemistry: Secondary | ICD-10-CM | POA: Diagnosis not present

## 2022-04-07 DIAGNOSIS — N182 Chronic kidney disease, stage 2 (mild): Secondary | ICD-10-CM | POA: Diagnosis not present

## 2022-04-07 DIAGNOSIS — K8309 Other cholangitis: Secondary | ICD-10-CM | POA: Diagnosis not present

## 2022-04-07 LAB — GLUCOSE, CAPILLARY
Glucose-Capillary: 120 mg/dL — ABNORMAL HIGH (ref 70–99)
Glucose-Capillary: 139 mg/dL — ABNORMAL HIGH (ref 70–99)
Glucose-Capillary: 233 mg/dL — ABNORMAL HIGH (ref 70–99)
Glucose-Capillary: 155 mg/dL — ABNORMAL HIGH (ref 70–99)

## 2022-04-07 LAB — CBC WITH DIFFERENTIAL/PLATELET
Abs Immature Granulocytes: 0.24 10*3/uL — ABNORMAL HIGH (ref 0.00–0.07)
Basophils Absolute: 0.1 10*3/uL (ref 0.0–0.1)
Basophils Relative: 0 %
Eosinophils Absolute: 0 10*3/uL (ref 0.0–0.5)
Eosinophils Relative: 0 %
HCT: 23.5 % — ABNORMAL LOW (ref 36.0–46.0)
Hemoglobin: 8.2 g/dL — ABNORMAL LOW (ref 12.0–15.0)
Immature Granulocytes: 2 %
Lymphocytes Relative: 14 %
Lymphs Abs: 1.6 10*3/uL (ref 0.7–4.0)
MCH: 30.8 pg (ref 26.0–34.0)
MCHC: 34.9 g/dL (ref 30.0–36.0)
MCV: 88.3 fL (ref 80.0–100.0)
Monocytes Absolute: 0.7 10*3/uL (ref 0.1–1.0)
Monocytes Relative: 6 %
Neutro Abs: 8.7 10*3/uL — ABNORMAL HIGH (ref 1.7–7.7)
Neutrophils Relative %: 78 %
Platelets: 174 10*3/uL (ref 150–400)
RBC: 2.66 MIL/uL — ABNORMAL LOW (ref 3.87–5.11)
RDW: 16.2 % — ABNORMAL HIGH (ref 11.5–15.5)
WBC: 11.2 10*3/uL — ABNORMAL HIGH (ref 4.0–10.5)
nRBC: 0 % (ref 0.0–0.2)

## 2022-04-07 LAB — COMPREHENSIVE METABOLIC PANEL
ALT: 806 U/L — ABNORMAL HIGH (ref 0–44)
AST: 512 U/L — ABNORMAL HIGH (ref 15–41)
Albumin: 3.9 g/dL (ref 3.5–5.0)
Alkaline Phosphatase: 86 U/L (ref 38–126)
Anion gap: 15 (ref 5–15)
BUN: 8 mg/dL (ref 8–23)
CO2: 22 mmol/L (ref 22–32)
Calcium: 8.1 mg/dL — ABNORMAL LOW (ref 8.9–10.3)
Chloride: 96 mmol/L — ABNORMAL LOW (ref 98–111)
Creatinine, Ser: 0.58 mg/dL (ref 0.44–1.00)
GFR, Estimated: >60 mL/min (ref >60)
Glucose, Bld: 108 mg/dL — ABNORMAL HIGH (ref 70–99)
Potassium: 3.9 mmol/L (ref 3.5–5.1)
Sodium: 133 mmol/L — ABNORMAL LOW (ref 135–145)
Total Bilirubin: 1.8 mg/dL — ABNORMAL HIGH (ref 0.3–1.2)
Total Protein: 6.1 g/dL — ABNORMAL LOW (ref 6.5–8.1)

## 2022-04-07 LAB — MAGNESIUM: Magnesium: 2.1 mg/dL (ref 1.7–2.4)

## 2022-04-07 MED ORDER — CALCIUM CARBONATE 1250 (500 CA) MG PO TABS
1250.0000 mg | ORAL_TABLET | Freq: Two times a day (BID) | ORAL | Status: DC
  Administered 2022-04-08 – 2022-04-09 (×3): 1250 mg via ORAL
  Filled 2022-04-07 (×4): qty 1

## 2022-04-07 NOTE — Plan of Care (Signed)

## 2022-04-07 NOTE — Progress Notes (Signed)
[facility] PROGRESS NOTE    Progress Note  LAPORSCHE [name]  IEP:[id number] DOB: 05-10-1940 DOA: 03/27/2022 PCP: Lauren Gravel, MD     Brief Narrative:   Lauren [name] is an 82 y.o. female past medical history for aortic atherosclerosis, diabetes mellitus type 2 fecal incontinence, history of hemolytic anemia comes into the emergency room due to epigastric abdominal pain that started on the day of admission came into the emergency room at the facility she became unresponsive only to sternal rubs abdominal ultrasound showed bile duct dilation gallbladder with thickening, MRCP was done showed wall thickening with pericholecystic edema highly suspicious for acute cholecystitis dilated intrahepatic and extrahepatic duct, on 03/28/2022 became hypotensive not responding to fluids required temporarily pressors, patient was eventually weaned off pressors transferred to to the floor. GI was consulted ERCP performed on 03/29/2022 concerning for acute cholangitis and acute cholecystitis, general surgery was consulted she status post laparoscopy on 03/31/2022.  Patient is significantly fluid overloaded and placed on IV Lasix due to worsening transaminases CT scan and MRI of the abdomen were done and showed 2 lesions in the liver compatible with intrahepatic abscess bilateral pleural effusion, diffuse body wall edema    Assessment/Plan:   Septic shock secondary to acute cholangitis and acute cholecystitis: Hypotensive on admission which required pressors now has been weaned off. Status post ERCP and lap chole with partial cholecystectomy on 03/31/2022. Patient is now tolerating her diet ID was consulted She was started on antibiotics on admission 03/28/2022 IV Zosyn and transition to Augmentin to complete a 2-week course in the treatment of 04/13/2022. Tmax of 98.3.  Left ear is a significantly improved. Blood cultures on 04/04/2022 have been negative till date. MRCP was repeated which is concerning for  intrahepatic abscesses. Recommended to continue oral antibiotics for 2 weeks as stated above.  Will need to follow-up with ID on 04/13/2022. Mobilization and diet per general surgery. PT evaluated the patient, she will need home health PT.  Metabolic acidosis: Likely due to acute infection improved with bicarb which has been discontinued. Continue empiric antibiotics  Anemia of chronic disease: Relatively stable around 8.2, currently asymptomatic transfuse if less than 7 or symptomatic.  Acute kidney injury: Likely prerenal azotemia in the setting of ARB. Which improved with IV fluid resuscitation. She started third spacing IV fluids were stopped and she was started on IV Lasix she is negative about 7 L. Renal function continues to be stable.  Hypokalemia/hypomagnesemia: Repleted orally now resolved.  Thrombocytopenia: No signs of overt bleeding likely due to infections.  E. coli UTI: She has completed her course of antibiotics in house.  Hypothyroidism: Continue Synthroid.  Essential hypertension: Continue to hold antihypertensive medication in the setting of sepsis. Blood pressure slowly trending up we will continue to monitor closely.  Diabetes mellitus type 2: With last A1c of 6.0 continue to hold oral hypoglycemic agents continue sliding scale.  Hypocalcemia: Around 8.1 continue replete orally.  Volume overload: Patient was noted to be 8 L positive during hospital stay patient was started on IV Lasix as well as IV albumin she is negative about 7 L. Clinically appears euvolemic. The albumin and IV Lasix for 2 additional doses.  Coughing: When drinking swallowing evaluation was ordered recommended regular diet.  DVT prophylaxis: lovenox Family Communication daughter Status is: Inpatient Remains inpatient appropriate because: Acute cholangitis    Code Status:     Code Status Orders  (From admission, onward)           Start  Ordered   03/28/22 1208   Full code  Continuous       Question:  By:  Answer:  Consent: discussion documented in EHR   03/28/22 1207           Code Status History     Date Active Date Inactive Code Status Order ID Comments User Context   02/25/2020 1756 02/26/2020 1554 Full Code [id number]  Lauren Barrow, PA Inpatient         IV Access:   Peripheral IV   Procedures and diagnostic studies:   DG Swallowing Func-Speech Pathology  Result Date: 04/06/2022 Lauren [name]     04/06/2022  1:46 PM Objective Swallowing Evaluation: Type of Study: MBS-Modified Barium Swallow Study (This study was not available on the [facility] Work list, so full assessment posted under Progress Notes) Patient Details Name: Lauren [name] MRN: [id number] Date of Birth: [birth year] Today's Date: 04/06/2022 Time: SLP Start Time (ACUTE ONLY): 35 -SLP Stop Time (ACUTE ONLY): 0630 SLP Time Calculation (min) (ACUTE ONLY): 23 min Past Medical History: Past Medical History: Diagnosis Date  Allergy   Aortic atherosclerosis ([place])   Arthritis   Diabetes mellitus   type 2   Diverticulosis   Dyspepsia   Fecal incontinence   Gastritis   GERD (gastroesophageal reflux disease)   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/2016  Possible related to pyridium  G6PD studies pending  Hemorrhoids   Hiatal hernia   Hyperlipidemia   Hyperlipidemia   Hypertension   Hypothyroidism   IBS (irritable bowel syndrome)   Iron overload 07/13/2016  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  Osteoporosis   UTI (lower urinary tract infection)   Vitamin D deficiency  Past Surgical History: Past Surgical History: Procedure Laterality Date  CHOLECYSTECTOMY N/A 03/31/2022  Procedure: LAPAROSCOPIC CHOLECYSTECTOMY;  Surgeon: Lauren Ok, MD;  Location: [facility];  Service: General;  Laterality: N/A;  COLONOSCOPY    ERCP N/A 03/29/2022  Procedure: ENDOSCOPIC RETROGRADE CHOLANGIOPANCREATOGRAPHY (ERCP);  Surgeon: Lauren Mayer, MD;  Location: [facility];  Service: Gastroenterology;   Laterality: N/A;  PARTIAL KNEE ARTHROPLASTY Left 02/25/2020  Procedure: UNICOMPARTMENTAL KNEE;  Surgeon: Lauren Arabian, MD;  Location: [facility];  Service: Orthopedics;  Laterality: Left;  15mn  REMOVAL OF STONES  03/29/2022  Procedure: REMOVAL OF STONES;  Surgeon: GGatha Mayer MD;  [facility];  Service: Gastroenterology;;  SJoan Mayans 03/29/2022  Procedure: SJoan Mayans  Surgeon: GGatha Mayer MD;  Location: [facility];  Service: Gastroenterology;; HPI: Patient is an 82y.o. female with PMH: OA, DM, GERD, HLD, HTN, hypothyroidism, osteoporosis, Lt [facility]A in 2021. She presented to the hospital on 03/27/22 with abdominal pain and several days of poor PO intake. She was found to have cholangitis vs cholecystitis. She underwent an ERCP on 1/15 and a laparoscopic cholecystectomy on 03/31/22. Her granddaughter reports intermittent coughing spells when eating/drinking.  Subjective: pleasant  Recommendations for follow up therapy are one component of a multi-disciplinary discharge planning process, led by the attending physician.  Recommendations may be updated based on patient status, additional functional criteria and insurance authorization. Assessment / Plan / Recommendation   04/06/2022  12:00 PM Clinical Impressions Clinical Impression Pt presents with functional oropharyngeal swallow. She was reluctant to eat/drink, stating she was full and had just finished lunch. Granddaughter was present for interpretation.  She demonstrated some oral holding of POs, generally relating to her reluctance as opposed to a physiological impairment.  There was brisk onset of the swallow, normal pharyngeal stripping and base-of-tongue  retraction (no residue), reliable laryngeal vestibule closure and no aspiration. There was only one instance of transient penetration of thin liquids into the vestibule when she was swallowing a pill (PAS score of 2, considered to be [facility]). Discussed results with pt/granddaughter. No  dysphagia identified. She may advance to regular solids or remain on dysphagia 3 while admitted according to her preferences. May have thin liquids and can take pills whole with water. No further SLP f/u is needed,. SLP Visit Diagnosis Dysphagia, unspecified (R13.10) Impact on safety and function No limitations     04/06/2022  12:00 PM Treatment Recommendations Treatment Recommendations No treatment recommended at this time     04/05/2022   3:53 PM Prognosis Prognosis for Safe Diet Advancement Good   04/06/2022  12:00 PM Diet Recommendations SLP Diet Recommendations Regular solids;Dysphagia 3 (Mech soft) solids;Thin liquid Liquid Administration via Cup;Straw Medication Administration Whole meds with liquid     04/06/2022  12:00 PM Other Recommendations Oral Care Recommendations Oral care BID Follow Up Recommendations No SLP follow up   04/05/2022   3:53 PM Frequency and Duration  Speech Therapy Frequency (ACUTE ONLY) min 1 x/week     04/06/2022  12:00 PM Oral Phase Oral Phase [facility]    04/06/2022  12:00 PM Pharyngeal Phase Pharyngeal Phase WFL     No data to display    Juan Quam Laurice 04/06/2022, 1:45 PM                     MR ABDOMEN MRCP W WO CONTAST  Result Date: 04/06/2022 CLINICAL DATA:  82 year old female with history of elevated liver function tests. Status post cholecystectomy. EXAM: MRI ABDOMEN WITHOUT AND WITH CONTRAST (INCLUDING MRCP) TECHNIQUE: Multiplanar multisequence MR imaging of the abdomen was performed both before and after the administration of intravenous contrast. Heavily T2-weighted images of the biliary and pancreatic ducts were obtained, and three-dimensional MRCP images were rendered by post processing. CONTRAST:  95m GADAVIST GADOBUTROL 1 MMOL/ML IV SOLN COMPARISON:  Abdominal MRI/MRCP 03/28/2022. CT of the abdomen and pelvis 04/04/2022. FINDINGS: Lower chest: Moderate bilateral pleural effusions lying dependently. Extensive passive subsegmental atelectasis noted in the lower lobes of the  lungs bilaterally. Hepatobiliary: Scattered areas of loss of signal intensity throughout the hepatic parenchyma on out of phase dual echo images, indicative of heterogeneous hepatic steatosis. Heterogeneous perfusion of the liver noted during early post gadolinium phases, which normalize on delayed post gadolinium phases. In the periphery of the right lobe of the liver there are 2 lesions (axial image 43 of series 21) which demonstrate peripheral rim hyperenhancement and central hypoenhancement on post gadolinium imaging. The largest of these is in the inferior aspect of segment 5 (axial image 21 of series 7 and coronal image 10 of series 6) measures 2.2 x 1.6 x 2.1 cm, and is centrally slightly low T1 signal intensity, heterogeneous T2 signal intensity, with peripheral rim enhancement and diffusion restriction, larger than the prior study, compatible with an intrahepatic abscess. The smaller lesion is less well-defined (best appreciated on axial image 43 of series 21) measuring 1.1 x 0.6 cm, also in segment 5, demonstrating T1 hypointensity, mild diffuse T2 hyperintensity, and peripheral enhancement on post gadolinium imaging, also likely a small abscess. No other definite hepatic lesions are confidently identified. Pancreas: No pancreatic mass. No pancreatic ductal dilatation on MRCP images. No pancreatic or peripancreatic fluid collections or inflammatory changes. Spleen:  Unremarkable. Adrenals/Urinary Tract: 5 mm T1 hypointense, T2 hyperintense, nonenhancing lesion in the anterior aspect of  the interpolar region of the left kidney, compatible with a simple (Bosniak class 1) cyst, which requires no imaging follow-up. Right kidney and bilateral adrenal glands are otherwise normal in appearance. No hydroureteronephrosis in the visualized portions of the abdomen. Stomach/Bowel: Visualized portions are unremarkable. Vascular/Lymphatic: No aneurysm identified in the visualized abdominal vasculature. No  lymphadenopathy noted in the abdomen. Other:  Trace volume of perihepatic ascites. Musculoskeletal: No aggressive appearing osseous lesions are noted in the visualized portions of the skeleton. Diffuse body wall edema. IMPRESSION: 1. Two lesions in segment 5 of the liver, as above, with imaging characteristics compatible with intrahepatic abscesses. 2. Heterogeneous hepatic steatosis. 3. Moderate bilateral pleural effusions with extensive passive atelectasis in the lower lobes of the lungs bilaterally. 4. A trace volume of perihepatic ascites. 5. Diffuse body wall edema. 6. Additional incidental findings, as above. Electronically Signed   By: Vinnie Langton M.D.   On: 04/06/2022 08:17   MR 3D Recon At Scanner  Result Date: 04/06/2022 CLINICAL DATA:  82 year old female with history of elevated liver function tests. Status post cholecystectomy. EXAM: MRI ABDOMEN WITHOUT AND WITH CONTRAST (INCLUDING MRCP) TECHNIQUE: Multiplanar multisequence MR imaging of the abdomen was performed both before and after the administration of intravenous contrast. Heavily T2-weighted images of the biliary and pancreatic ducts were obtained, and three-dimensional MRCP images were rendered by post processing. CONTRAST:  80m GADAVIST GADOBUTROL 1 MMOL/ML IV SOLN COMPARISON:  Abdominal MRI/MRCP 03/28/2022. CT of the abdomen and pelvis 04/04/2022. FINDINGS: Lower chest: Moderate bilateral pleural effusions lying dependently. Extensive passive subsegmental atelectasis noted in the lower lobes of the lungs bilaterally. Hepatobiliary: Scattered areas of loss of signal intensity throughout the hepatic parenchyma on out of phase dual echo images, indicative of heterogeneous hepatic steatosis. Heterogeneous perfusion of the liver noted during early post gadolinium phases, which normalize on delayed post gadolinium phases. In the periphery of the right lobe of the liver there are 2 lesions (axial image 43 of series 21) which demonstrate  peripheral rim hyperenhancement and central hypoenhancement on post gadolinium imaging. The largest of these is in the inferior aspect of segment 5 (axial image 21 of series 7 and coronal image 10 of series 6) measures 2.2 x 1.6 x 2.1 cm, and is centrally slightly low T1 signal intensity, heterogeneous T2 signal intensity, with peripheral rim enhancement and diffusion restriction, larger than the prior study, compatible with an intrahepatic abscess. The smaller lesion is less well-defined (best appreciated on axial image 43 of series 21) measuring 1.1 x 0.6 cm, also in segment 5, demonstrating T1 hypointensity, mild diffuse T2 hyperintensity, and peripheral enhancement on post gadolinium imaging, also likely a small abscess. No other definite hepatic lesions are confidently identified. Pancreas: No pancreatic mass. No pancreatic ductal dilatation on MRCP images. No pancreatic or peripancreatic fluid collections or inflammatory changes. Spleen:  Unremarkable. Adrenals/Urinary Tract: 5 mm T1 hypointense, T2 hyperintense, nonenhancing lesion in the anterior aspect of the interpolar region of the left kidney, compatible with a simple (Bosniak class 1) cyst, which requires no imaging follow-up. Right kidney and bilateral adrenal glands are otherwise normal in appearance. No hydroureteronephrosis in the visualized portions of the abdomen. Stomach/Bowel: Visualized portions are unremarkable. Vascular/Lymphatic: No aneurysm identified in the visualized abdominal vasculature. No lymphadenopathy noted in the abdomen. Other:  Trace volume of perihepatic ascites. Musculoskeletal: No aggressive appearing osseous lesions are noted in the visualized portions of the skeleton. Diffuse body wall edema. IMPRESSION: 1. Two lesions in segment 5 of the liver, as above, with  imaging characteristics compatible with intrahepatic abscesses. 2. Heterogeneous hepatic steatosis. 3. Moderate bilateral pleural effusions with extensive passive  atelectasis in the lower lobes of the lungs bilaterally. 4. A trace volume of perihepatic ascites. 5. Diffuse body wall edema. 6. Additional incidental findings, as above. Electronically Signed   By: Vinnie Langton M.D.   On: 04/06/2022 08:17     Medical Consultants:   None.   Subjective:    Leiah Y Germano tolerating her diet having bowel movements.  Objective:    Vitals:   04/06/22 0835 04/06/22 1326 04/06/22 2105 04/07/22 0604  BP: (!) 153/57 (!) 132/57 (!) 137/53 (!) 148/62  Pulse: 63 87 70 70  Resp: '18 16 17 16  '$ Temp: 97.9 F (36.6 C) 98.2 F (36.8 C) 98.7 F (37.1 C) 98.6 F (37 C)  TempSrc: Oral Oral Oral Oral  SpO2: 98% 97% 98% 98%  Height:       SpO2: 98 % O2 Flow Rate (L/min): 1 L/min   Intake/Output Summary (Last 24 hours) at 04/07/2022 0954 Last data filed at 04/07/2022 0600 Gross per 24 hour  Intake 681.5 ml  Output 4325 ml  Net -3643.5 ml   There were no vitals filed for this visit.  Exam: General exam: In no acute distress. Respiratory system: Good air movement and clear to auscultation. Cardiovascular system: S1 & S2 heard, RRR. No JVD. Gastrointestinal system: Abdomen is nondistended, soft and nontender.  Extremities: No pedal edema. Skin: No rashes, lesions or ulcers Psychiatry: Judgement and insight appear normal. Mood & affect appropriate.    Data Reviewed:    Labs: Basic Metabolic Panel: Recent Labs  Lab 04/03/22 0445 04/04/22 0256 04/05/22 0350 04/06/22 0408 04/07/22 0408  NA 136 130* 131* 132* 133*  K 2.5* 3.6 3.7 2.9* 3.9  CL 95* 93* 94* 96* 96*  CO2 '29 26 26 26 22  '$ GLUCOSE 128* 153* 128* 125* 108*  BUN '15 15 14 11 8  '$ CREATININE 0.78 0.86 0.81 0.74 0.58  CALCIUM 7.9* 7.2* 7.3* 7.5* 8.1*  MG 1.5* 2.1 1.8 1.5* 2.1   GFR CrCl cannot be calculated (Unknown ideal weight.). Liver Function Tests: Recent Labs  Lab 04/03/22 0445 04/04/22 0256 04/05/22 0350 04/06/22 0408 04/07/22 0408  AST 144* 742* 1,983* 1,256* 512*   ALT 169* 517* 1,283* 980* 806*  ALKPHOS 94 114 108 79 86  BILITOT 2.5* 2.3* 1.8* 1.6* 1.8*  PROT 5.1* 5.4* 5.0* 5.1* 6.1*  ALBUMIN 3.5 3.0* 2.7* 2.7* 3.9   Recent Labs  Lab 04/05/22 0830 04/06/22 0408  LIPASE 389* 362*   No results for input(s): "AMMONIA" in the last 168 hours. Coagulation profile Recent Labs  Lab 04/05/22 1400  INR 1.5*   COVID-19 Labs  No results for input(s): "DDIMER", "FERRITIN", "LDH", "CRP" in the last 72 hours.  Lab Results  Component Value Date   SARSCOV2NAA NEGATIVE 03/11/2022   [place] NEGATIVE 02/21/2020    CBC: Recent Labs  Lab 04/03/22 0445 04/04/22 0256 04/05/22 0350 04/06/22 0408 04/07/22 0408  WBC 15.3* 21.5* 17.9* 9.7 11.2*  NEUTROABS 11.9* 17.9* 14.8* 7.4 8.7*  HGB 8.9* 10.1* 8.9* 8.3* 8.2*  HCT 25.0* 28.7* 25.2* 23.3* 23.5*  MCV 85.9 86.7 85.7 86.0 88.3  PLT 76* 120* 141* 148* 174   Cardiac Enzymes: No results for input(s): "CKTOTAL", "CKMB", "CKMBINDEX", "TROPONINI" in the last 168 hours. BNP (last 3 results) No results for input(s): "PROBNP" in the last 8760 hours. CBG: Recent Labs  Lab 04/06/22 0741 04/06/22 1122 04/06/22 1619 04/06/22  2135 04/07/22 0738  GLUCAP 118* 145* 158* 106* 120*   D-Dimer: No results for input(s): "DDIMER" in the last 72 hours. Hgb A1c: No results for input(s): "HGBA1C" in the last 72 hours. Lipid Profile: No results for input(s): "CHOL", "HDL", "LDLCALC", "TRIG", "CHOLHDL", "LDLDIRECT" in the last 72 hours. Thyroid function studies: No results for input(s): "TSH", "T4TOTAL", "T3FREE", "THYROIDAB" in the last 72 hours.  Invalid input(s): "FREET3" Anemia work up: No results for input(s): "VITAMINB12", "FOLATE", "FERRITIN", "TIBC", "IRON", "RETICCTPCT" in the last 72 hours. Sepsis Labs: Recent Labs  Lab 04/04/22 0256 04/05/22 0350 04/06/22 0408 04/07/22 0408  WBC 21.5* 17.9* 9.7 11.2*   Microbiology Recent Results (from the past 240 hour(s))  MRSA Next Gen by PCR, Nasal      Status: None   Collection Time: 03/28/22  9:39 PM   Specimen: Nasal Mucosa; Nasal Swab  Result Value Ref Range Status   MRSA by PCR Next Gen NOT DETECTED NOT DETECTED Final    Comment: (NOTE) The GeneXpert MRSA Assay (FDA approved for NASAL specimens only), is one component of a comprehensive MRSA colonization surveillance program. It is not intended to diagnose MRSA infection nor to guide or monitor treatment for MRSA infections. Test performance is not FDA approved in patients less than 78 years old. Performed at [facility], [place] [address], [place], [place] [address]   Culture, blood (Routine X 2) w Reflex to ID Panel     Status: None (Preliminary result)   Collection Time: 04/04/22 10:27 AM   Specimen: BLOOD  Result Value Ref Range Status   Specimen Description   Final    BLOOD LEFT ANTECUBITAL Performed at [facility] [address] [place], [place] [address]    Special Requests   Final    BOTTLES DRAWN AEROBIC ONLY Blood Culture adequate volume Performed at [place] [address], [place], [place] [address]    Culture   Final    NO GROWTH 3 DAYS Performed at [facility], [place] [address] [place], [place] [address]    Report Status PENDING  Incomplete  Culture, blood (Routine X 2) w Reflex to ID Panel     Status: None (Preliminary result)   Collection Time: 04/04/22 10:27 AM   Specimen: BLOOD  Result Value Ref Range Status   Specimen Description   Final    BLOOD RIGHT ANTECUBITAL Performed at [place] [address], [place], [place] [address]    Special Requests   Final    BOTTLES DRAWN AEROBIC ONLY Blood Culture adequate volume Performed at [place] [address] [place], [place] [address]    Culture   Final    NO GROWTH 3 DAYS Performed at [facility], [place] [address] [place],  [address]    Report Status PENDING  Incomplete      Medications:    acetaminophen  1,000 mg Oral Q6H   amoxicillin-clavulanate  1 tablet Oral Q12H   Chlorhexidine Gluconate Cloth  6 each Topical Q2200   enoxaparin (LOVENOX) injection  40 mg Subcutaneous Q24H   famotidine  20 mg Oral QHS   furosemide  20 mg Intravenous Q12H   insulin aspart  0-9 Units Subcutaneous TID WC   levothyroxine  75 mcg Oral Q0600   lip balm   Topical BID   polyethylene glycol  17 g Oral Daily   sodium chloride flush  10-40 mL Intracatheter Q12H  Continuous Infusions:  sodium chloride Stopped (03/29/22 1113)   methocarbamol (ROBAXIN) IV     ondansetron (ZOFRAN) IV        LOS: 10 days   Charlynne Cousins  [facility]  04/07/2022, 9:54 AM

## 2022-04-07 NOTE — Plan of Care (Signed)
  Problem: Clinical Measurements: Goal: Will remain free from infection Outcome: Progressing Goal: Diagnostic test results will improve Outcome: Progressing Goal: Respiratory complications will improve Outcome: Progressing Goal: Cardiovascular complication will be avoided Outcome: Progressing   Problem: Pain Managment: Goal: General experience of comfort will improve Outcome: Progressing   Problem: Safety: Goal: Ability to remain free from injury will improve Outcome: Progressing   Problem: Education: Goal: Ability to describe self-care measures that may prevent or decrease complications (Diabetes Survival Skills Education) will improve Outcome: Progressing Goal: Individualized Educational Video(s) Outcome: Progressing   Problem: Fluid Volume: Goal: Ability to maintain a balanced intake and output will improve Outcome: Progressing   Problem: Skin Integrity: Goal: Risk for impaired skin integrity will decrease Outcome: Progressing   Problem: Tissue Perfusion: Goal: Adequacy of tissue perfusion will improve Outcome: Progressing   Problem: Health Behavior/Discharge Planning: Goal: Ability to manage health-related needs will improve Outcome: Not Progressing   Problem: Clinical Measurements: Goal: Ability to maintain clinical measurements within normal limits will improve Outcome: Not Progressing   Problem: Activity: Goal: Risk for activity intolerance will decrease Outcome: Not Progressing   Problem: Nutrition: Goal: Adequate nutrition will be maintained Outcome: Not Progressing   Problem: Coping: Goal: Level of anxiety will decrease Outcome: Not Progressing   Problem: Elimination: Goal: Will not experience complications related to bowel motility Outcome: Not Progressing Goal: Will not experience complications related to urinary retention Outcome: Not Progressing   Problem: Skin Integrity: Goal: Risk for impaired skin integrity will decrease Outcome: Not  Progressing   Problem: Coping: Goal: Ability to adjust to condition or change in health will improve Outcome: Not Progressing   Problem: Health Behavior/Discharge Planning: Goal: Ability to identify and utilize available resources and services will improve Outcome: Not Progressing Goal: Ability to manage health-related needs will improve Outcome: Not Progressing   Problem: Metabolic: Goal: Ability to maintain appropriate glucose levels will improve Outcome: Not Progressing   Problem: Nutritional: Goal: Maintenance of adequate nutrition will improve Outcome: Not Progressing Goal: Progress toward achieving an optimal weight will improve Outcome: Not Progressing   Problem: Safety: Goal: Non-violent Restraint(s) Outcome: Not Applicable

## 2022-04-07 NOTE — Progress Notes (Signed)
Physical Therapy Treatment Patient Details Name: Lauren [name] MRN: [id number] DOB: 08-28-1940 Today's Date: 04/07/2022   History of Present Illness Patient is 82 y.o. female admitted to hospital with abdominal pain, several days of poor p.o. intake found to have cholangitis versus cholecystitis. Pt s/p ERCP on 03/29/22.  s/p Laparoscopic Partial Cholecystectomy on 1/17.    CT scan and MRI of the abdomen were done and showed 2 lesions in the liver compatible with intrahepatic abscess bilateral pleural effusion, diffuse body wall edema     PMH significant for OA, DM, GERD, HLD, HTN, hypothyroidism, osteoporosis, Lt UKA in 2021.    PT Comments    Pt just back in bed after amb with family assistance.  Agreeable to exercises in bed. Pt tol 20 reps of most LEs exercises including manual resistance for strengthening. Denies pain and very little fatigue.  Will continue to follow and progress mobility in acute setting  Recommendations for follow up therapy are one component of a multi-disciplinary discharge planning process, led by the attending physician.  Recommendations may be updated based on patient status, additional functional criteria and insurance authorization.  Follow Up Recommendations  Home health PT     Assistance Recommended at Discharge Frequent or constant Supervision/Assistance  Patient can return home with the following Assistance with cooking/housework;Direct supervision/assist for medications management;Assist for transportation;Help with stairs or ramp for entrance;A little help with walking and/or transfers;A little help with bathing/dressing/bathroom   Equipment Recommendations  None recommended by PT    Recommendations for Other Services       Precautions / Restrictions Precautions Precautions: Fall Precaution Comments: CVC Right IJ Restrictions Weight Bearing Restrictions: No     Mobility  Bed Mobility               General bed mobility comments:  (declines  OOB, just walked with family and also OT earlier per family report)    Transfers                        Ambulation/Gait                   Stairs             Wheelchair Mobility    Modified Rankin (Stroke Patients Only)       Balance                                            Cognition Arousal/Alertness: Awake/alert Behavior During Therapy: WFL for tasks assessed/performed Overall Cognitive Status: Difficult to assess                                 General Comments: family member assisted with interpreting; appears oriented, responds appropriately        Exercises General Exercises - Lower Extremity Ankle Circles/Pumps: AROM, Both, 20 reps Quad Sets: Both, 10 reps, Strengthening Gluteal Sets: Both, 10 reps, Strengthening Heel Slides: AROM, Both, 15 reps, Strengthening, Other (comment) (with manual resistance) Hip ABduction/ADduction: AROM, 20 reps, Both Straight Leg Raises: AROM, Strengthening, Both, 20 reps    General Comments        Pertinent Vitals/Pain Pain Assessment Pain Assessment: No/denies pain    Home Living  Prior Function            PT Goals (current goals can now be found in the care plan section) Acute Rehab PT Goals PT Goal Formulation: With patient Time For Goal Achievement: 04/13/22 Potential to Achieve Goals: Good Progress towards PT goals: Progressing toward goals    Frequency    Min 3X/week      PT Plan Current plan remains appropriate    Co-evaluation              AM-PAC PT "6 Clicks" Mobility   Outcome Measure  Help needed turning from your back to your side while in a flat bed without using bedrails?: A Little Help needed moving from lying on your back to sitting on the side of a flat bed without using bedrails?: A Little Help needed moving to and from a bed to a chair (including a wheelchair)?: A Little Help needed  standing up from a chair using your arms (e.g., wheelchair or bedside chair)?: A Little Help needed to walk in hospital room?: A Little Help needed climbing 3-5 steps with a railing? : A Little 6 Click Score: 18    End of Session   Activity Tolerance: Patient tolerated treatment well Patient left: in bed;with call bell/phone within reach;with bed alarm set;with family/visitor present Nurse Communication: Mobility status PT Visit Diagnosis: Unsteadiness on feet (R26.81);Muscle weakness (generalized) (M62.81);Difficulty in walking, not elsewhere classified (R26.2)     Time: 5537-4827 PT Time Calculation (min) (ACUTE ONLY): 23 min  Charges:  $Therapeutic Exercise: 23-37 mins                     Baxter Flattery, PT  Acute Rehab Dept [facility]) [phone]  [facility] Weekend Pager [facility] only)  [phone]  04/07/2022    [facility] 04/07/2022, 4:38 PM

## 2022-04-07 NOTE — TOC Initial Note (Addendum)
Transition of Care [facility]) - Initial/Assessment Note    Patient Details  Name: Lauren [name] MRN: [id number] Date of Birth: Apr 19, 1940  Transition of Care [facility]) CM/SW Contact:    Dessa Phi, RN Phone Number: 04/07/2022, 12:29 PM  Clinical Narrative:  Gaylord Shih  [facility] agency to accept for HHPT. Will need face to face order.                Expected Discharge Plan: [place] Barriers to Discharge: Continued Medical Work up   Patient Goals and CMS Choice Patient states their goals for this hospitalization and ongoing recovery are::  (Home) CMS Medicare.gov Compare Post Acute Care list provided to:: Patient Represenative (must comment) (grand dtr Apolonio Schneiders) Choice offered to / list presented to : Adult [place] ownership interest in [facility].provided to:: Adult Children    Expected Discharge Plan and Services   Discharge Planning Services: CM Consult Post Acute Care Choice: [place] arrangements for the past 2 months: [place]                                      Prior Living Arrangements/Services Living arrangements for the past 2 months: Single Family Home Lives with:: Adult Children                   Activities of Daily Living Home Assistive Devices/Equipment: None ADL Screening (condition at time of admission) Patient's cognitive ability adequate to safely complete daily activities?: Yes Is the patient deaf or have difficulty hearing?: No Does the patient have difficulty seeing, even when wearing glasses/contacts?: No Does the patient have difficulty concentrating, remembering, or making decisions?: No Patient able to express need for assistance with ADLs?: Yes Does the patient have difficulty dressing or bathing?: No Independently performs ADLs?: Yes (appropriate for developmental age) Does the patient have difficulty walking or climbing stairs?: No Weakness of Legs: Both Weakness of  Arms/Hands: None  Permission Sought/Granted                  Emotional Assessment              Admission diagnosis:  Epigastric pain [R10.13] Hepatitis [K75.9] Elevated LFTs [R79.89] Urinary tract infection without hematuria, site unspecified [N39.0] Patient Active Problem List   Diagnosis Date Noted   Metabolic acidosis 09/73/5329   Acute cholecystitis 03/31/2022   Transaminitis 03/31/2022   Anemia of chronic disease 03/31/2022   Thrombocytopenia ([place]) 03/31/2022   Septic shock ([place]) 03/31/2022   Urinary tract infection without hematuria 03/31/2022   Shock (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   Iron overload 07/13/2016   Knee pain, bilateral 07/06/2016   Upset stomach 05/18/2016   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   Ear ringing 02/23/2016   Fecal incontinence 01/03/2015   Nausea without vomiting 92/42/6834   Periumbilical abdominal pain 01/03/2015   HTN (hypertension) 01/25/2011  Hyperlipidemia 01/25/2011   Type 2 diabetes mellitus ([place]) 01/25/2011   GERD (gastroesophageal reflux disease) 01/25/2011   Osteoporosis 01/25/2011   Hypothyroidism 01/25/2011   PCP:  Jani Gravel, MD Pharmacy:   [facility] [place], [place] - [address] [place] [address] Phone: [phone] Fax: [phone]  [facility] ([place], [place] [facility] [address] [address] Phone: [phone] Fax: [phone]     Social Determinants of Health (SDOH) Social History: [place]: No Food Insecurity (03/29/2022)  Housing: Low Risk  (03/29/2022)  Transportation Needs: No Transportation Needs (03/29/2022)  Utilities: Not At Risk (03/29/2022)  Tobacco Use: Low Risk  (04/01/2022)   SDOH Interventions:     Readmission Risk Interventions     No data to display

## 2022-04-07 NOTE — Progress Notes (Signed)
Occupational Therapy Treatment Patient Details Name: Lauren [name] MRN: [id number] DOB: 03/20/1940 Today's Date: 04/07/2022   History of present illness Patient is 82 y.o. female admitted to hospital with abdominal pain, several days of poor p.o. intake found to have cholangitis versus cholecystitis. Pt s/p ERCP on 03/29/22 with plan for lap chole on 03/31/22. PMH significant for OA, DM, GERD, HLD, HTN, hypothyroidism, osteoporosis, Lt UKA in 2021.   OT comments  Patient was noted to be more alert during this session. Patient requested to engage in increased activity tolerance challenges with RW. Patient and granddaughter were educated on importance of choosing tasks that were most important to patient to spend energy on at home and what granddaughter needed to complete herself. Patient's discharge plan remains appropriate at this time. OT will continue to follow acutely.     Recommendations for follow up therapy are one component of a multi-disciplinary discharge planning process, led by the attending physician.  Recommendations may be updated based on patient status, additional functional criteria and insurance authorization.    Follow Up Recommendations  Home health OT     Assistance Recommended at Discharge Frequent or constant Supervision/Assistance  Patient can return home with the following  A lot of help with bathing/dressing/bathroom;Assistance with cooking/housework;Direct supervision/assist for medications management;Assist for transportation;Help with stairs or ramp for entrance;Direct supervision/assist for financial management;Assistance with feeding;A little help with walking and/or transfers   Equipment Recommendations  BSC/3in1;Wheelchair cushion (measurements OT);Wheelchair (measurements OT)    Recommendations for Other Services      Precautions / Restrictions Precautions Precautions: Fall Precaution Comments: CVC Right IJ Restrictions Weight Bearing Restrictions: No        Mobility Bed Mobility Overal bed mobility: Needs Assistance Bed Mobility: Supine to Sit     Supine to sit: Min assist, HOB elevated          Transfers                         Balance Overall balance assessment: Needs assistance Sitting-balance support: Feet supported, Single extremity supported Sitting balance-Leahy Scale: Fair     Standing balance support: During functional activity, Bilateral upper extremity supported, Reliant on assistive device for balance Standing balance-Leahy Scale: Poor Standing balance comment: need UE support                           ADL either performed or assessed with clinical judgement   ADL Overall ADL's : Needs assistance/impaired                                       General ADL Comments: patient and grandaughter were educated on importance of prioitizing patients energy towards desired tasks during the day. patient verbalized understanding.      Cognition Arousal/Alertness: Awake/alert Behavior During Therapy: WFL for tasks assessed/performed Overall Cognitive Status: Difficult to assess                                 General Comments: family member assisted with interpreting                   Pertinent Vitals/ Pain       Pain Assessment Pain Assessment: No/denies pain         Frequency  Min 2X/week  Progress Toward Goals  OT Goals(current goals can now be found in the care plan section)  Progress towards OT goals: Progressing toward goals     Plan Discharge plan remains appropriate       AM-PAC OT "6 Clicks" Daily Activity     Outcome Measure   Help from another person eating meals?: A Little Help from another person taking care of personal grooming?: A Little Help from another person toileting, which includes using toliet, bedpan, or urinal?: A Lot Help from another person bathing (including washing, rinsing, drying)?: A Lot Help from  another person to put on and taking off regular upper body clothing?: A Lot Help from another person to put on and taking off regular lower body clothing?: A Lot 6 Click Score: 14    End of Session Equipment Utilized During Treatment: Gait belt;Rolling walker (2 wheels)  OT Visit Diagnosis: Unsteadiness on feet (R26.81);Other abnormalities of gait and mobility (R26.89);Muscle weakness (generalized) (M62.81)   Activity Tolerance Patient tolerated treatment well;Patient limited by fatigue   Patient Left in chair;with call bell/phone within reach;with chair alarm set;with family/visitor present   Nurse Communication Other (comment) (ok to participate, updated on participation)        Time: 1505-6979 OT Time Calculation (min): 26 min  Charges: OT General Charges $OT Visit: 1 Visit OT Treatments $Therapeutic Activity: 23-37 mins  Rennie Plowman, MS Acute Rehabilitation Department Office# [phone]    Willa Rough 04/07/2022, 12:26 PM

## 2022-04-07 NOTE — Progress Notes (Signed)
7 Days Post-Op   Subjective/Chief Complaint: Still with mild RLQ to palpation. Appetite improving. BM this am.  Granddaughter bedside  Objective: Vital signs in last 24 hours: Temp:  [98.2 F (36.8 C)-98.7 F (37.1 C)] 98.6 F (37 C) (01/24 0604) Pulse Rate:  [70-87] 70 (01/24 0604) Resp:  [16-17] 16 (01/24 0604) BP: (132-148)/(53-62) 148/62 (01/24 0604) SpO2:  [97 %-98 %] 98 % (01/24 0604) Last BM Date : 04/05/22  Intake/Output from previous day: 01/23 0701 - 01/24 0700 In: 681.5 [P.O.:480; IV Piggyback:201.5] Out: 4325 [Urine:4325] Intake/Output this shift: No intake/output data recorded.  Gen: laying comfortably in bed. NAD Lungs: resp effort unlabored on room air Abd: soft, port sites c/d/I, mild TTP RLQ without rebound or guarding  MSK: calves soft and NT  Lab Results:  Recent Labs    04/06/22 0408 04/07/22 0408  WBC 9.7 11.2*  HGB 8.3* 8.2*  HCT 23.3* 23.5*  PLT 148* 174    BMET Recent Labs    04/06/22 0408 04/07/22 0408  NA 132* 133*  K 2.9* 3.9  CL 96* 96*  CO2 26 22  GLUCOSE 125* 108*  BUN 11 8  CREATININE 0.74 0.58  CALCIUM 7.5* 8.1*    PT/INR Recent Labs    04/05/22 1400  LABPROT 17.6*  INR 1.5*    ABG No results for input(s): "PHART", "HCO3" in the last 72 hours.  Invalid input(s): "PCO2", "PO2"  Studies/Results: DG Swallowing Func-Speech Pathology  Result Date: 04/06/2022 Frederich Balding     04/06/2022  1:46 PM Objective Swallowing Evaluation: Type of Study: MBS-Modified Barium Swallow Study (This study was not available on the [facility] Work list, so full assessment posted under Progress Notes) Patient Details Name: Lauren [name] MRN: [id number] Date of Birth: 02-05-1941 Today's Date: 04/06/2022 Time: SLP Start Time (ACUTE ONLY): 1443 -SLP Stop Time (ACUTE ONLY): 1540 SLP Time Calculation (min) (ACUTE ONLY): 23 min Past Medical History: Past Medical History: Diagnosis Date  Allergy   Aortic atherosclerosis ([place])   Arthritis    Diabetes mellitus   type 2   Diverticulosis   Dyspepsia   Fecal incontinence   Gastritis   GERD (gastroesophageal reflux disease)   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  Possible related to pyridium  G6PD studies pending  Hemorrhoids   Hiatal hernia   Hyperlipidemia   Hyperlipidemia   Hypertension   Hypothyroidism   IBS (irritable bowel syndrome)   Iron overload 07/13/2016  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  Osteoporosis   UTI (lower urinary tract infection)   Vitamin D deficiency  Past Surgical History: Past Surgical History: Procedure Laterality Date  CHOLECYSTECTOMY N/A 03/31/2022  Procedure: LAPAROSCOPIC CHOLECYSTECTOMY;  Surgeon: Ralene Ok, MD;  Location: [facility];  Service: General;  Laterality: N/A;  COLONOSCOPY    ERCP N/A 03/29/2022  Procedure: ENDOSCOPIC RETROGRADE CHOLANGIOPANCREATOGRAPHY (ERCP);  Surgeon: Gatha Mayer, MD;  Location: [facility];  Service: Gastroenterology;  Laterality: N/A;  PARTIAL KNEE ARTHROPLASTY Left 02/25/2020  Procedure: UNICOMPARTMENTAL KNEE;  Surgeon: Gaynelle Arabian, MD;  Location: [facility];  Service: Orthopedics;  Laterality: Left;  73mn  REMOVAL OF STONES  03/29/2022  Procedure: REMOVAL OF STONES;  Surgeon: GGatha Mayer MD;  [facility];  Service: Gastroenterology;;  SJoan Mayans 03/29/2022  Procedure: SJoan Mayans  Surgeon: GGatha Mayer MD;  Location: [facility];  Service: Gastroenterology;; HPI: Patient is an 82y.o. female with PMH: OA, DM, GERD, HLD, HTN, hypothyroidism, osteoporosis, Lt [facility]A in 2021. She presented to the hospital on  03/27/22 with abdominal pain and several days of poor PO intake. She was found to have cholangitis vs cholecystitis. She underwent an ERCP on 1/15 and a laparoscopic cholecystectomy on 03/31/22. Her granddaughter reports intermittent coughing spells when eating/drinking.  Subjective: pleasant  Recommendations for follow up therapy are one component of a multi-disciplinary discharge planning process, led by the  attending physician.  Recommendations may be updated based on patient status, additional functional criteria and insurance authorization. Assessment / Plan / Recommendation   04/06/2022  12:00 PM Clinical Impressions Clinical Impression Pt presents with functional oropharyngeal swallow. She was reluctant to eat/drink, stating she was full and had just finished lunch. Granddaughter was present for interpretation.  She demonstrated some oral holding of POs, generally relating to her reluctance as opposed to a physiological impairment.  There was brisk onset of the swallow, normal pharyngeal stripping and base-of-tongue retraction (no residue), reliable laryngeal vestibule closure and no aspiration. There was only one instance of transient penetration of thin liquids into the vestibule when she was swallowing a pill (PAS score of 2, considered to be [facility]). Discussed results with pt/granddaughter. No dysphagia identified. She may advance to regular solids or remain on dysphagia 3 while admitted according to her preferences. May have thin liquids and can take pills whole with water. No further SLP f/u is needed,. SLP Visit Diagnosis Dysphagia, unspecified (R13.10) Impact on safety and function No limitations     04/06/2022  12:00 PM Treatment Recommendations Treatment Recommendations No treatment recommended at this time     04/05/2022   3:53 PM Prognosis Prognosis for Safe Diet Advancement Good   04/06/2022  12:00 PM Diet Recommendations SLP Diet Recommendations Regular solids;Dysphagia 3 (Mech soft) solids;Thin liquid Liquid Administration via Cup;Straw Medication Administration Whole meds with liquid     04/06/2022  12:00 PM Other Recommendations Oral Care Recommendations Oral care BID Follow Up Recommendations No SLP follow up   04/05/2022   3:53 PM Frequency and Duration  Speech Therapy Frequency (ACUTE ONLY) min 1 x/week     04/06/2022  12:00 PM Oral Phase Oral Phase [facility]    04/06/2022  12:00 PM Pharyngeal Phase Pharyngeal  Phase WFL     No data to display    Juan Quam Laurice 04/06/2022, 1:45 PM                     MR ABDOMEN MRCP W WO CONTAST  Result Date: 04/06/2022 CLINICAL DATA:  82 year old female with history of elevated liver function tests. Status post cholecystectomy. EXAM: MRI ABDOMEN WITHOUT AND WITH CONTRAST (INCLUDING MRCP) TECHNIQUE: Multiplanar multisequence MR imaging of the abdomen was performed both before and after the administration of intravenous contrast. Heavily T2-weighted images of the biliary and pancreatic ducts were obtained, and three-dimensional MRCP images were rendered by post processing. CONTRAST:  70m GADAVIST GADOBUTROL 1 MMOL/ML IV SOLN COMPARISON:  Abdominal MRI/MRCP 03/28/2022. CT of the abdomen and pelvis 04/04/2022. FINDINGS: Lower chest: Moderate bilateral pleural effusions lying dependently. Extensive passive subsegmental atelectasis noted in the lower lobes of the lungs bilaterally. Hepatobiliary: Scattered areas of loss of signal intensity throughout the hepatic parenchyma on out of phase dual echo images, indicative of heterogeneous hepatic steatosis. Heterogeneous perfusion of the liver noted during early post gadolinium phases, which normalize on delayed post gadolinium phases. In the periphery of the right lobe of the liver there are 2 lesions (axial image 43 of series 21) which demonstrate peripheral rim hyperenhancement and central hypoenhancement on post gadolinium imaging. The  largest of these is in the inferior aspect of segment 5 (axial image 21 of series 7 and coronal image 10 of series 6) measures 2.2 x 1.6 x 2.1 cm, and is centrally slightly low T1 signal intensity, heterogeneous T2 signal intensity, with peripheral rim enhancement and diffusion restriction, larger than the prior study, compatible with an intrahepatic abscess. The smaller lesion is less well-defined (best appreciated on axial image 43 of series 21) measuring 1.1 x 0.6 cm, also in segment 5,  demonstrating T1 hypointensity, mild diffuse T2 hyperintensity, and peripheral enhancement on post gadolinium imaging, also likely a small abscess. No other definite hepatic lesions are confidently identified. Pancreas: No pancreatic mass. No pancreatic ductal dilatation on MRCP images. No pancreatic or peripancreatic fluid collections or inflammatory changes. Spleen:  Unremarkable. Adrenals/Urinary Tract: 5 mm T1 hypointense, T2 hyperintense, nonenhancing lesion in the anterior aspect of the interpolar region of the left kidney, compatible with a simple (Bosniak class 1) cyst, which requires no imaging follow-up. Right kidney and bilateral adrenal glands are otherwise normal in appearance. No hydroureteronephrosis in the visualized portions of the abdomen. Stomach/Bowel: Visualized portions are unremarkable. Vascular/Lymphatic: No aneurysm identified in the visualized abdominal vasculature. No lymphadenopathy noted in the abdomen. Other:  Trace volume of perihepatic ascites. Musculoskeletal: No aggressive appearing osseous lesions are noted in the visualized portions of the skeleton. Diffuse body wall edema. IMPRESSION: 1. Two lesions in segment 5 of the liver, as above, with imaging characteristics compatible with intrahepatic abscesses. 2. Heterogeneous hepatic steatosis. 3. Moderate bilateral pleural effusions with extensive passive atelectasis in the lower lobes of the lungs bilaterally. 4. A trace volume of perihepatic ascites. 5. Diffuse body wall edema. 6. Additional incidental findings, as above. Electronically Signed   By: Vinnie Langton M.D.   On: 04/06/2022 08:17   MR 3D Recon At Scanner  Result Date: 04/06/2022 CLINICAL DATA:  82 year old female with history of elevated liver function tests. Status post cholecystectomy. EXAM: MRI ABDOMEN WITHOUT AND WITH CONTRAST (INCLUDING MRCP) TECHNIQUE: Multiplanar multisequence MR imaging of the abdomen was performed both before and after the administration  of intravenous contrast. Heavily T2-weighted images of the biliary and pancreatic ducts were obtained, and three-dimensional MRCP images were rendered by post processing. CONTRAST:  66m GADAVIST GADOBUTROL 1 MMOL/ML IV SOLN COMPARISON:  Abdominal MRI/MRCP 03/28/2022. CT of the abdomen and pelvis 04/04/2022. FINDINGS: Lower chest: Moderate bilateral pleural effusions lying dependently. Extensive passive subsegmental atelectasis noted in the lower lobes of the lungs bilaterally. Hepatobiliary: Scattered areas of loss of signal intensity throughout the hepatic parenchyma on out of phase dual echo images, indicative of heterogeneous hepatic steatosis. Heterogeneous perfusion of the liver noted during early post gadolinium phases, which normalize on delayed post gadolinium phases. In the periphery of the right lobe of the liver there are 2 lesions (axial image 43 of series 21) which demonstrate peripheral rim hyperenhancement and central hypoenhancement on post gadolinium imaging. The largest of these is in the inferior aspect of segment 5 (axial image 21 of series 7 and coronal image 10 of series 6) measures 2.2 x 1.6 x 2.1 cm, and is centrally slightly low T1 signal intensity, heterogeneous T2 signal intensity, with peripheral rim enhancement and diffusion restriction, larger than the prior study, compatible with an intrahepatic abscess. The smaller lesion is less well-defined (best appreciated on axial image 43 of series 21) measuring 1.1 x 0.6 cm, also in segment 5, demonstrating T1 hypointensity, mild diffuse T2 hyperintensity, and peripheral enhancement on post gadolinium imaging, also  likely a small abscess. No other definite hepatic lesions are confidently identified. Pancreas: No pancreatic mass. No pancreatic ductal dilatation on MRCP images. No pancreatic or peripancreatic fluid collections or inflammatory changes. Spleen:  Unremarkable. Adrenals/Urinary Tract: 5 mm T1 hypointense, T2 hyperintense,  nonenhancing lesion in the anterior aspect of the interpolar region of the left kidney, compatible with a simple (Bosniak class 1) cyst, which requires no imaging follow-up. Right kidney and bilateral adrenal glands are otherwise normal in appearance. No hydroureteronephrosis in the visualized portions of the abdomen. Stomach/Bowel: Visualized portions are unremarkable. Vascular/Lymphatic: No aneurysm identified in the visualized abdominal vasculature. No lymphadenopathy noted in the abdomen. Other:  Trace volume of perihepatic ascites. Musculoskeletal: No aggressive appearing osseous lesions are noted in the visualized portions of the skeleton. Diffuse body wall edema. IMPRESSION: 1. Two lesions in segment 5 of the liver, as above, with imaging characteristics compatible with intrahepatic abscesses. 2. Heterogeneous hepatic steatosis. 3. Moderate bilateral pleural effusions with extensive passive atelectasis in the lower lobes of the lungs bilaterally. 4. A trace volume of perihepatic ascites. 5. Diffuse body wall edema. 6. Additional incidental findings, as above. Electronically Signed   By: Vinnie Langton M.D.   On: 04/06/2022 08:17    Anti-infectives: Anti-infectives (From admission, onward)    Start     Dose/Rate Route Frequency Ordered Stop   04/05/22 1015  amoxicillin-clavulanate (AUGMENTIN) 875-125 MG per tablet 1 tablet        1 tablet Oral Every 12 hours 04/05/22 0922     04/05/22 0900  piperacillin-tazobactam (ZOSYN) IVPB 3.375 g  Status:  Discontinued        3.375 g 12.5 mL/hr over 240 Minutes Intravenous Every 8 hours 04/05/22 0745 04/05/22 0922   04/02/22 2000  amoxicillin-clavulanate (AUGMENTIN) 875-125 MG per tablet 1 tablet  Status:  Discontinued        1 tablet Oral Every 12 hours 04/02/22 1202 04/05/22 0745   03/29/22 2100  piperacillin-tazobactam (ZOSYN) IVPB 3.375 g  Status:  Discontinued        3.375 g 12.5 mL/hr over 240 Minutes Intravenous Every 8 hours 03/28/22 2051 03/29/22  0947   03/29/22 1000  piperacillin-tazobactam (ZOSYN) IVPB 3.375 g  Status:  Discontinued        3.375 g 12.5 mL/hr over 240 Minutes Intravenous Every 8 hours 03/29/22 0947 04/02/22 1202   03/29/22 0800  cefTRIAXone (ROCEPHIN) 2 g in sodium chloride 0.9 % 100 mL IVPB  Status:  Discontinued        2 g 200 mL/hr over 30 Minutes Intravenous Every 24 hours 03/28/22 1058 03/28/22 1212   03/29/22 0800  cefTRIAXone (ROCEPHIN) 1 g in sodium chloride 0.9 % 100 mL IVPB  Status:  Discontinued        1 g 200 mL/hr over 30 Minutes Intravenous Every 24 hours 03/28/22 1212 03/28/22 1603   03/28/22 1700  piperacillin-tazobactam (ZOSYN) IVPB 3.375 g  Status:  Discontinued        3.375 g 12.5 mL/hr over 240 Minutes Intravenous Every 8 hours 03/28/22 1603 03/28/22 2051   03/28/22 1115  metroNIDAZOLE (FLAGYL) IVPB 500 mg  Status:  Discontinued        500 mg 100 mL/hr over 60 Minutes Intravenous 2 times daily 03/28/22 1058 03/28/22 1609   03/28/22 0645  cefTRIAXone (ROCEPHIN) 1 g in sodium chloride 0.9 % 100 mL IVPB        1 g 200 mL/hr over 30 Minutes Intravenous  Once 03/28/22 0644 03/28/22 0801  Assessment/Plan: s/p Procedure(s): LAPAROSCOPIC CHOLECYSTECTOMY (N/A) POD 7 s/p laparoscopic partial cholecystectomy 1/17 AR - WBC 11.2, transaminases downtrending  - CT scan abdomen pelvis 1/21 with 2 new indeterminate hypodensities of the liver. GI following and MRCP 1/22 with intrahepatic abscesses. Has been seen by ID in the past and will follow up with them outpatient. Cont abx - cont dys 3 diet, cont ensure - IS, PT/OT, mobilize  Post op follow up has been arranged and general surgery will remain available as needed during admission   FEN: dys 3 ID: [email or web address]. Now augmentin VTE: SCDs, thrombocytopenia improved. Start LMWH 1/23   LOS: 10 days   [facility] 04/07/2022, 9:36 AM Please see Amion for pager number during day hours 7:00am-4:30pm

## 2022-04-08 DIAGNOSIS — N182 Chronic kidney disease, stage 2 (mild): Secondary | ICD-10-CM | POA: Diagnosis not present

## 2022-04-08 DIAGNOSIS — N390 Urinary tract infection, site not specified: Secondary | ICD-10-CM | POA: Diagnosis not present

## 2022-04-08 DIAGNOSIS — R7989 Other specified abnormal findings of blood chemistry: Secondary | ICD-10-CM | POA: Diagnosis not present

## 2022-04-08 DIAGNOSIS — K8309 Other cholangitis: Secondary | ICD-10-CM | POA: Diagnosis not present

## 2022-04-08 LAB — COMPREHENSIVE METABOLIC PANEL
ALT: 419 U/L — ABNORMAL HIGH (ref 0–44)
AST: 159 U/L — ABNORMAL HIGH (ref 15–41)
Albumin: 3.3 g/dL — ABNORMAL LOW (ref 3.5–5.0)
Alkaline Phosphatase: 89 U/L (ref 38–126)
Anion gap: 9 (ref 5–15)
BUN: 10 mg/dL (ref 8–23)
CO2: 26 mmol/L (ref 22–32)
Calcium: 8.1 mg/dL — ABNORMAL LOW (ref 8.9–10.3)
Chloride: 97 mmol/L — ABNORMAL LOW (ref 98–111)
Creatinine, Ser: 0.69 mg/dL (ref 0.44–1.00)
GFR, Estimated: >60 mL/min (ref >60)
Glucose, Bld: 137 mg/dL — ABNORMAL HIGH (ref 70–99)
Potassium: 3.3 mmol/L — ABNORMAL LOW (ref 3.5–5.1)
Sodium: 132 mmol/L — ABNORMAL LOW (ref 135–145)
Total Bilirubin: 1.3 mg/dL — ABNORMAL HIGH (ref 0.3–1.2)
Total Protein: 5.5 g/dL — ABNORMAL LOW (ref 6.5–8.1)

## 2022-04-08 LAB — GLUCOSE, CAPILLARY
Glucose-Capillary: 129 mg/dL — ABNORMAL HIGH (ref 70–99)
Glucose-Capillary: 214 mg/dL — ABNORMAL HIGH (ref 70–99)
Glucose-Capillary: 228 mg/dL — ABNORMAL HIGH (ref 70–99)
Glucose-Capillary: 105 mg/dL — ABNORMAL HIGH (ref 70–99)

## 2022-04-08 MED ORDER — POTASSIUM CHLORIDE CRYS ER 20 MEQ PO TBCR
40.0000 meq | EXTENDED_RELEASE_TABLET | Freq: Two times a day (BID) | ORAL | Status: AC
  Administered 2022-04-08 (×2): 40 meq via ORAL
  Filled 2022-04-08 (×2): qty 2

## 2022-04-08 NOTE — Plan of Care (Signed)

## 2022-04-08 NOTE — Progress Notes (Signed)
[facility] PROGRESS NOTE    Progress Note  Lauren [name]  EQA:[id number] DOB: 07-Mar-1941 DOA: 03/27/2022 PCP: Jani Gravel, MD     Brief Narrative:   Lauren [name] is an 82 y.o. female past medical history for aortic atherosclerosis, diabetes mellitus type 2 fecal incontinence, history of hemolytic anemia comes into the emergency room due to epigastric abdominal pain found to be in septic shock due to acute cholangitis and possible cholecystitis, MRCP was done showed wall thickening with pericholecystic edema highly suspicious for acute cholecystitis dilated intrahepatic and extrahepatic duct, on 03/28/2022 became hypotensive not responding to fluids required temporarily pressors, patient was eventually weaned off pressors transferred to to the floor. GI was consulted ERCP performed on 03/29/2022 concerning for acute cholangitis. General surgery was consulted she status post laparoscopy on 03/31/2022 with acute cholecystectomy.  Patient was significantly fluid overloaded and placed on IV Lasix. Due to worsening transaminases CT scan and MRI of the abdomen were done and showed 2 lesions in the liver compatible with intrahepatic abscess bilateral pleural effusion, diffuse body wall edema.  ID was consulted to transition IV Zosyn to IV Unasyn.   Assessment/Plan:   Septic shock secondary to acute cholangitis and acute cholecystitis: Status post ERCP and lap chole with partial cholecystectomy on 03/31/2022. Patient is now tolerating her diet. Repeated MRCP showed MRCP was repeated which is concerning for intrahepatic abscesses. Augmentin to complete a 2-week course in the treatment of 04/13/2022. Tmax of 98.3.   Blood cultures on 04/04/2022 have been negative till date. PT evaluated the patient, she will need home health PT.  Metabolic acidosis: Likely due to acute infection improved with bicarb which has been discontinued. Continue empiric antibiotics  Anemia of chronic disease: Relatively  stable around 8.2, currently asymptomatic transfuse if less than 7 or symptomatic.  Acute kidney injury: Likely prerenal azotemia in the setting of ARB. Which improved with IV fluid resuscitation. She is negative about 8.5 L, stop diuretics Renal function continues to be stable.  Hypokalemia/hypomagnesemia: Potassium is low today, replete orally.  Thrombocytopenia: No signs of overt bleeding likely due to infections.  E. coli UTI: She has completed her course of antibiotics in house.  Hypothyroidism: Continue Synthroid.  Essential hypertension: Continue to hold antihypertensive medication in the setting of sepsis. Blood pressure slowly trending up we will continue to monitor closely.  Diabetes mellitus type 2: With last A1c of 6.0 continue to hold oral hypoglycemic agents continue sliding scale.  Hypocalcemia: Around 8.1 continue replete orally.  Volume overload: Patient is negative about 8.3 L after IV diuresis. Monitor electrolytes and replete as needed. Stop IV fluids.  Coughing: When drinking swallowing evaluation was ordered recommended regular diet.  DVT prophylaxis: lovenox Family Communication daughter Status is: Inpatient Remains inpatient appropriate because: Acute cholangitis, home tomorrow.    Code Status:     Code Status Orders  (From admission, onward)           Start     Ordered   03/28/22 1208  Full code  Continuous       Question:  By:  Answer:  Consent: discussion documented in EHR   03/28/22 1207           Code Status History     Date Active Date Inactive Code Status Order ID Comments User Context   02/25/2020 1756 02/26/2020 1554 Full Code [id number]  Derl Barrow, PA Inpatient         IV Access:   Peripheral IV  Procedures and diagnostic studies:   DG Swallowing Func-Speech Pathology  Result Date: 04/06/2022 Frederich Balding     04/06/2022  1:46 PM Objective Swallowing Evaluation: Type of Study:  MBS-Modified Barium Swallow Study (This study was not available on the [facility] Work list, so full assessment posted under Progress Notes) Patient Details Name: Lauren [name] MRN: [id number] Date of Birth: [birth year] Today's Date: 04/06/2022 Time: SLP Start Time (ACUTE ONLY): 18 -SLP Stop Time (ACUTE ONLY): 0539 SLP Time Calculation (min) (ACUTE ONLY): 23 min Past Medical History: Past Medical History: Diagnosis Date  Allergy   Aortic atherosclerosis ([place])   Arthritis   Diabetes mellitus   type 2   Diverticulosis   Dyspepsia   Fecal incontinence   Gastritis   GERD (gastroesophageal reflux disease)   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[place]) 07/14/2016  Possible related to pyridium  G6PD studies pending  Hemorrhoids   Hiatal hernia   Hyperlipidemia   Hyperlipidemia   Hypertension   Hypothyroidism   IBS (irritable bowel syndrome)   Iron overload 07/13/2016  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  Osteoporosis   UTI (lower urinary tract infection)   Vitamin D deficiency  Past Surgical History: Past Surgical History: Procedure Laterality Date  CHOLECYSTECTOMY N/A 03/31/2022  Procedure: LAPAROSCOPIC CHOLECYSTECTOMY;  Surgeon: Ralene Ok, MD;  Location: [facility];  Service: General;  Laterality: N/A;  COLONOSCOPY    ERCP N/A 03/29/2022  Procedure: ENDOSCOPIC RETROGRADE CHOLANGIOPANCREATOGRAPHY (ERCP);  Surgeon: Gatha Mayer, MD;  Location: [facility];  Service: Gastroenterology;  Laterality: N/A;  PARTIAL KNEE ARTHROPLASTY Left 02/25/2020  Procedure: UNICOMPARTMENTAL KNEE;  Surgeon: Gaynelle Arabian, MD;  Location: [facility];  Service: Orthopedics;  Laterality: Left;  53mn  REMOVAL OF STONES  03/29/2022  Procedure: REMOVAL OF STONES;  Surgeon: GGatha Mayer MD;  [facility];  Service: Gastroenterology;;  SJoan Mayans 03/29/2022  Procedure: SJoan Mayans  Surgeon: GGatha Mayer MD;  Location: [facility];  Service: Gastroenterology;; HPI: Patient is an 82y.o. female with PMH: OA, DM, GERD, HLD, HTN, hypothyroidism,  osteoporosis, Lt UKA in 2021. She presented to the hospital on 03/27/22 with abdominal pain and several days of poor PO intake. She was found to have cholangitis vs cholecystitis. She underwent an ERCP on 1/15 and a laparoscopic cholecystectomy on 03/31/22. Her granddaughter reports intermittent coughing spells when eating/drinking.  Subjective: pleasant  Recommendations for follow up therapy are one component of a multi-disciplinary discharge planning process, led by the attending physician.  Recommendations may be updated based on patient status, additional functional criteria and insurance authorization. Assessment / Plan / Recommendation   04/06/2022  12:00 PM Clinical Impressions Clinical Impression Pt presents with functional oropharyngeal swallow. She was reluctant to eat/drink, stating she was full and had just finished lunch. Granddaughter was present for interpretation.  She demonstrated some oral holding of POs, generally relating to her reluctance as opposed to a physiological impairment.  There was brisk onset of the swallow, normal pharyngeal stripping and base-of-tongue retraction (no residue), reliable laryngeal vestibule closure and no aspiration. There was only one instance of transient penetration of thin liquids into the vestibule when she was swallowing a pill (PAS score of 2[facility]. Discussed results with pt/granddaughter. No dysphagia identified. She may advance to regular solids or remain on dysphagia 3 while admitted according to her preferences. May have thin liquids and can take pills whole with water. No further SLP f/u is needed,. SLP Visit Diagnosis Dysphagia, unspecified (R13.10) Impact on safety and function  No limitations     04/06/2022  12:00 PM Treatment Recommendations Treatment Recommendations No treatment recommended at this time     04/05/2022   3:53 PM Prognosis Prognosis for Safe Diet Advancement Good   04/06/2022  12:00 PM Diet Recommendations SLP Diet  Recommendations Regular solids;Dysphagia 3 (Mech soft) solids;Thin liquid Liquid Administration via Cup;Straw Medication Administration Whole meds with liquid     04/06/2022  12:00 PM Other Recommendations Oral Care Recommendations Oral care BID Follow Up Recommendations No SLP follow up   04/05/2022   3:53 PM Frequency and Duration  Speech Therapy Frequency (ACUTE ONLY) min 1 x/week     04/06/2022  12:00 PM Oral Phase Oral Phase [facility]    04/06/2022  12:00 PM Pharyngeal Phase Pharyngeal Phase [facility]     No data to display    Lauren [name] 04/06/2022, 1:45 PM                       Medical Consultants:   None.   Subjective:    Lauren [name] tolerating her diet no complaints  Objective:    Vitals:   04/07/22 1150 04/07/22 1303 04/07/22 2138 04/08/22 0631  BP:  (!) 158/62 (!) 139/52 139/74  Pulse:  86 83 81  Resp:  '16 16 15  '$ Temp:  98.6 F (37 C) 98.8 F (37.1 C) 98.3 F (36.8 C)  TempSrc:  Oral Oral Oral  SpO2:  98% 98% 99%  Weight: 52 kg     Height:       SpO2: 99 % O2 Flow Rate (L/min): 1 L/min   Intake/Output Summary (Last 24 hours) at 04/08/2022 0903 Last data filed at 04/08/2022 0551 Gross per 24 hour  Intake 1560 ml  Output 2750 ml  Net -1190 ml    Filed Weights   04/07/22 1150  Weight: 52 kg    Exam: General exam: In no acute distress. Respiratory system: Good air movement and clear to auscultation. Cardiovascular system: S1 & S2 heard, RRR. No JVD. Gastrointestinal system: Abdomen is nondistended, soft and nontender.  Extremities: No pedal edema. Skin: No rashes, lesions or ulcers  Data Reviewed:    Labs: Basic Metabolic Panel: Recent Labs  Lab 04/03/22 0445 04/04/22 0256 04/05/22 0350 04/06/22 0408 04/07/22 0408 04/08/22 0235  NA 136 130* 131* 132* 133* 132*  K 2.5* 3.6 3.7 2.9* 3.9 3.3*  CL 95* 93* 94* 96* 96* 97*  CO2 '29 26 26 26 22 26  '$ GLUCOSE 128* 153* 128* 125* 108* 137*  BUN '15 15 14 11 8 10  '$ CREATININE 0.78 0.86 0.81 0.74 0.58 0.69   CALCIUM 7.9* 7.2* 7.3* 7.5* 8.1* 8.1*  MG 1.5* 2.1 1.8 1.5* 2.1  --     GFR Estimated Creatinine Clearance: 35.8 mL/min (by C-G formula based on SCr of 0.69 mg/dL). Liver Function Tests: Recent Labs  Lab 04/04/22 0256 04/05/22 0350 04/06/22 0408 04/07/22 0408 04/08/22 0235  AST 742* 1,983* 1,256* 512* 159*  ALT 517* 1,283* 980* 806* 419*  ALKPHOS 114 108 79 86 89  BILITOT 2.3* 1.8* 1.6* 1.8* 1.3*  PROT 5.4* 5.0* 5.1* 6.1* 5.5*  ALBUMIN 3.0* 2.7* 2.7* 3.9 3.3*    Recent Labs  Lab 04/05/22 0830 04/06/22 0408  LIPASE 389* 362*    No results for input(s): "AMMONIA" in the last 168 hours. Coagulation profile Recent Labs  Lab 04/05/22 1400  INR 1.5*    COVID-19 Labs  No results for input(s): "DDIMER", "FERRITIN", "LDH", "CRP"  in the last 72 hours.  Lab Results  Component Value Date   SARSCOV2NAA NEGATIVE 03/11/2022   [place] NEGATIVE 02/21/2020    CBC: Recent Labs  Lab 04/03/22 0445 04/04/22 0256 04/05/22 0350 04/06/22 0408 04/07/22 0408  WBC 15.3* 21.5* 17.9* 9.7 11.2*  NEUTROABS 11.9* 17.9* 14.8* 7.4 8.7*  HGB 8.9* 10.1* 8.9* 8.3* 8.2*  HCT 25.0* 28.7* 25.2* 23.3* 23.5*  MCV 85.9 86.7 85.7 86.0 88.3  PLT 76* 120* 141* 148* 174    Cardiac Enzymes: No results for input(s): "CKTOTAL", "CKMB", "CKMBINDEX", "TROPONINI" in the last 168 hours. BNP (last 3 results) No results for input(s): "PROBNP" in the last 8760 hours. CBG: Recent Labs  Lab 04/07/22 0738 04/07/22 1213 04/07/22 1643 04/07/22 2134 04/08/22 0818  GLUCAP 120* 139* 233* 155* 129*    D-Dimer: No results for input(s): "DDIMER" in the last 72 hours. Hgb A1c: No results for input(s): "HGBA1C" in the last 72 hours. Lipid Profile: No results for input(s): "CHOL", "HDL", "LDLCALC", "TRIG", "CHOLHDL", "LDLDIRECT" in the last 72 hours. Thyroid function studies: No results for input(s): "TSH", "T4TOTAL", "T3FREE", "THYROIDAB" in the last 72 hours.  Invalid input(s):  "FREET3" Anemia work up: No results for input(s): "VITAMINB12", "FOLATE", "FERRITIN", "TIBC", "IRON", "RETICCTPCT" in the last 72 hours. Sepsis Labs: Recent Labs  Lab 04/04/22 0256 04/05/22 0350 04/06/22 0408 04/07/22 0408  WBC 21.5* 17.9* 9.7 11.2*    Microbiology Recent Results (from the past 240 hour(s))  Culture, blood (Routine X 2) w Reflex to ID Panel     Status: None (Preliminary result)   Collection Time: 04/04/22 10:27 AM   Specimen: BLOOD  Result Value Ref Range Status   Specimen Description   Final    BLOOD LEFT ANTECUBITAL Performed at [facility], [place] [address], [place], [place] [address]    Special Requests   Final    BOTTLES DRAWN AEROBIC ONLY Blood Culture adequate volume Performed at [facility] [address] [place], [place] [address]    Culture   Final    NO GROWTH 4 DAYS Performed at [facility], [place] [address] [place], [place] [address]    Report Status PENDING  Incomplete  Culture, blood (Routine X 2) w Reflex to ID Panel     Status: None (Preliminary result)   Collection Time: 04/04/22 10:27 AM   Specimen: BLOOD  Result Value Ref Range Status   Specimen Description   Final    BLOOD RIGHT ANTECUBITAL Performed at [facility] [address] [place], [place] [address]    Special Requests   Final    BOTTLES DRAWN AEROBIC ONLY Blood Culture adequate volume Performed at [place] [address] [place], [place] [address]    Culture   Final    NO GROWTH 4 DAYS Performed at [facility], [place] [address], [place],  [address]    Report Status PENDING  Incomplete     Medications:    acetaminophen  1,000 mg Oral Q6H   amoxicillin-clavulanate  1 tablet Oral Q12H   calcium carbonate  1,250 mg Oral BID WC   Chlorhexidine Gluconate Cloth  6 each Topical Q2200   enoxaparin (LOVENOX) injection  40 mg Subcutaneous Q24H   famotidine  20 mg Oral  QHS   insulin aspart  0-9 Units Subcutaneous TID WC   levothyroxine  75 mcg Oral Q0600   lip balm   Topical BID   polyethylene glycol  17  g Oral Daily   sodium chloride flush  10-40 mL Intracatheter Q12H   Continuous Infusions:  sodium chloride Stopped (03/29/22 1113)   methocarbamol (ROBAXIN) IV     ondansetron (ZOFRAN) IV        LOS: 11 days   Charlynne Cousins  [facility]  04/08/2022, 9:03 AM

## 2022-04-08 NOTE — Plan of Care (Signed)

## 2022-04-08 NOTE — Care Management Important Message (Signed)
Important Message  Patient Details IM Letter given. Name: Lauren [name] MRN: [id number] Date of Birth: 1940-09-13   Medicare Important Message Given:  Yes     Kerin Salen 04/08/2022, 3:20 PM

## 2022-04-09 ENCOUNTER — Other Ambulatory Visit (HOSPITAL_COMMUNITY): Payer: Self-pay

## 2022-04-09 DIAGNOSIS — D638 Anemia in other chronic diseases classified elsewhere: Secondary | ICD-10-CM | POA: Diagnosis not present

## 2022-04-09 DIAGNOSIS — N390 Urinary tract infection, site not specified: Secondary | ICD-10-CM | POA: Diagnosis not present

## 2022-04-09 DIAGNOSIS — K810 Acute cholecystitis: Secondary | ICD-10-CM | POA: Diagnosis not present

## 2022-04-09 DIAGNOSIS — K8309 Other cholangitis: Secondary | ICD-10-CM | POA: Diagnosis not present

## 2022-04-09 LAB — COMPREHENSIVE METABOLIC PANEL
ALT: 347 U/L — ABNORMAL HIGH (ref 0–44)
AST: 71 U/L — ABNORMAL HIGH (ref 15–41)
Albumin: 3.3 g/dL — ABNORMAL LOW (ref 3.5–5.0)
Alkaline Phosphatase: 87 U/L (ref 38–126)
Anion gap: 10 (ref 5–15)
BUN: 9 mg/dL (ref 8–23)
CO2: 23 mmol/L (ref 22–32)
Calcium: 8.5 mg/dL — ABNORMAL LOW (ref 8.9–10.3)
Chloride: 101 mmol/L (ref 98–111)
Creatinine, Ser: 0.53 mg/dL (ref 0.44–1.00)
GFR, Estimated: >60 mL/min (ref >60)
Glucose, Bld: 113 mg/dL — ABNORMAL HIGH (ref 70–99)
Potassium: 4.4 mmol/L (ref 3.5–5.1)
Sodium: 134 mmol/L — ABNORMAL LOW (ref 135–145)
Total Bilirubin: 1.3 mg/dL — ABNORMAL HIGH (ref 0.3–1.2)
Total Protein: 5.4 g/dL — ABNORMAL LOW (ref 6.5–8.1)

## 2022-04-09 LAB — CULTURE, BLOOD (ROUTINE X 2)
Culture: NO GROWTH
Culture: NO GROWTH
Special Requests: ADEQUATE
Special Requests: ADEQUATE

## 2022-04-09 LAB — GLUCOSE, CAPILLARY
Glucose-Capillary: 121 mg/dL — ABNORMAL HIGH (ref 70–99)
Glucose-Capillary: 180 mg/dL — ABNORMAL HIGH (ref 70–99)

## 2022-04-09 MED ORDER — AMOXICILLIN-POT CLAVULANATE 875-125 MG PO TABS
1.0000 | ORAL_TABLET | Freq: Two times a day (BID) | ORAL | 0 refills | Status: DC
  Filled 2022-04-09: qty 28, 14d supply, fill #0

## 2022-04-09 NOTE — Discharge Summary (Signed)
Physician Discharge Summary  Lauren [name] PRF:[id number] DOB: [birth year] DOA: 03/27/2022  PCP: Jani Gravel, MD  Admit date: 03/27/2022 Discharge date: 04/09/2022  Admitted From: Home Disposition:  Home  Recommendations for Outpatient Follow-up:  Follow up with PCP in 1-2 weeks Please obtain BMP/CBC in one week   Home Health:No Equipment/Devices:None  Discharge Condition:Stable CODE STATUS:Full Diet recommendation: Heart Healthy  Brief/Interim Summary: 82 y.o. female past medical history for aortic atherosclerosis, diabetes mellitus type 2 fecal incontinence, history of hemolytic anemia comes into the emergency room due to epigastric abdominal pain found to be in septic shock due to acute cholangitis and possible cholecystitis, MRCP was done showed wall thickening with pericholecystic edema highly suspicious for acute cholecystitis dilated intrahepatic and extrahepatic duct, on 03/28/2022 became hypotensive not responding to fluids required temporarily pressors, patient was eventually weaned off pressors transferred to to the floor. GI was consulted ERCP performed on 03/29/2022 concerning for acute cholangitis. General surgery was consulted she status post laparoscopy on 03/31/2022 with acute cholecystectomy.  Patient was significantly fluid overloaded and placed on IV Lasix. Due to worsening transaminases CT scan and MRI of the abdomen were done and showed 2 lesions in the liver compatible with intrahepatic abscess bilateral pleural effusion, diffuse body wall edema.  ID was consulted to transition IV Zosyn to IV Unasyn.  And subsequently continue the antibiotics as an outpatient with oral Augmentin.  Discharge Diagnoses:  Principal Problem:   Ascending cholangitis Active Problems:   HTN (hypertension)   Hyperlipidemia   Type 2 diabetes mellitus (HCC)   GERD (gastroesophageal reflux disease)   Hypothyroidism   Nausea without vomiting   Chronic kidney disease, stage 2 (mild)   Iron  deficiency anemia   Recurrent urinary tract infection   Vertigo   Hepatitis   Hypokalemia   LFT elevation   Shock (HCC)   Metabolic acidosis   Acute cholecystitis   Transaminitis   Anemia of chronic disease   Thrombocytopenia (HCC)   Septic shock (HCC)   Urinary tract infection without hematuria  Septic shock secondary to acute cholangitis/acute cholecystitis: Started on IV Zosyn on admission.  And required pressors temporarily. GI was consulted perform an ERCP concern for acute cholangitis. General surgery was consulted she status post laparoscopy on 03/31/2022. Repeated MRCP was done that showed concerns for intrahepatic abscess. ID was consulted recommended transition IV Zosyn to IV Unasyn. They also recommended to continue 2-week course as an outpatient. She remained afebrile and repeated blood cultures remain negative till date. Physical therapy evaluated the patient recommended home health PT.  Metabolic acidosis: Likely due to infectious etiology.  Anemia of chronic disease: Hemoglobin has remained stable.  Acute kidney injury: Likely pre- azotemia in the setting of ARB this was held she was fluid resuscitated and her creatinine returned to baseline she will resume her ACE inhibitor as an outpatient her blood pressure is trending up.  Hypokalemia/hypomagnesemia: Repleted orally now resolved.  Thrombocytopenia: Likely due to infectious etiology now resolved.  E. coli UTI: She completed course of antibiotics in house.  Hypothyroidism: Continue Synthroid.  Essential hypertension: No changes made to her medication.  Diabetes mellitus type 2 with last hemoglobin A1c of 6.0: Oral hypoglycemic agents were held on admission she was started on sliding scale insulin but she required minimal insulin.  Actos was discontinued she will continue Janumet as an outpatient.  Hypocalcemia: Replete orally now resolved.  Volume overload: She was diuresed with IV Lasix about 8  L she is now euvolemic.  Discharge Instructions   Allergies as of 04/09/2022       Reactions   Pyridium [phenazopyridine Hcl] Other (See Comments)   Hemolytic anemia   Sulfa Antibiotics    Potential allergy: oxidizing drug: methemoglobinemia/hemolysis on pyridium   Ciprofloxacin Hcl    Sx's almost like a heart attack   Esomeprazole Magnesium Nausea Only   Pain and nausea   Macrodantin [nitrofurantoin Macrocrystal] Other (See Comments)   GI Upset   Pantoprazole Sodium Nausea Only   abd pain and nausea   Tramadol Nausea Only        Medication List     STOP taking these medications    pioglitazone 15 MG tablet Commonly known as: ACTOS       TAKE these medications    Accu-Chek Aviva Plus test strip Generic drug: glucose blood Use to check blood sugar 3 times daily.   acetaminophen 500 MG tablet Commonly known as: TYLENOL Take 1,000 mg by mouth every 6 (six) hours as needed for mild pain.   amoxicillin-clavulanate 875-125 MG tablet Commonly known as: AUGMENTIN Take 1 tablet by mouth every 12 (twelve) hours for 14 days.   glucosamine-chondroitin 500-400 MG tablet Take 1 tablet by mouth 3 (three) times daily.   levothyroxine 75 MCG tablet Commonly known as: SYNTHROID Take 75 mcg by mouth daily.   losartan 50 MG tablet Commonly known as: COZAAR Take 50 mg by mouth daily.   meclizine 25 MG tablet Commonly known as: ANTIVERT Take 25 mg by mouth every 12 (twelve) hours as needed for dizziness.   multivitamin with minerals tablet Take 1 tablet by mouth daily.   omeprazole 40 MG capsule Commonly known as: PRILOSEC Take 1 capsule (40 mg total) by mouth daily. What changed:  when to take this reasons to take this   Pfizer-BioNT COVID-19 Vac-TriS Susp injection Generic drug: COVID-19 mRNA Vac-TriS (Pfizer) Inject into the muscle.   pravastatin 80 MG tablet Commonly known as: PRAVACHOL Take 80 mg by mouth daily.   sitaGLIPtin-metformin 50-500 MG  tablet Commonly known as: JANUMET Take 1 tablet by mouth 2 (two) times daily with a meal.        Follow-up Information     Maczis, Carlena Hurl, PA-C Follow up on 04/22/2022.   Specialty: General Surgery Why: 1:45 pm, Arrive 30 minutes prior to your appointment time, Please bring your insurance card and photo ID Contact information: [place] [address] [place] [facility].   Why: [facility] Contact information: [place] [address] [phone]                Allergies  Allergen Reactions   Pyridium [Phenazopyridine Hcl] Other (See Comments)    Hemolytic anemia   Sulfa Antibiotics     Potential allergy: oxidizing drug: methemoglobinemia/hemolysis on pyridium   Ciprofloxacin Hcl     Sx's almost like a heart attack   Esomeprazole Magnesium Nausea Only    Pain and nausea   Macrodantin [Nitrofurantoin Macrocrystal] Other (See Comments)    GI Upset   Pantoprazole Sodium Nausea Only    abd pain and nausea   Tramadol Nausea Only    Consultations: General surgery Infectious disease Gastroenterology.    Procedures/Studies: DG Swallowing Func-Speech Pathology  Result Date: 04/06/2022 Frederich Balding     04/06/2022  1:46 PM Objective Swallowing Evaluation: Type of Study: MBS-Modified Barium Swallow Study (This  study was not available on the [facility] Work list, so full assessment posted under Progress Notes) Patient Details Name: Lauren [name] MRN: [id number] Date of Birth: 1940-11-27 Today's Date: 04/06/2022 Time: SLP Start Time (ACUTE ONLY): 47 -SLP Stop Time (ACUTE ONLY): 8786 SLP Time Calculation (min) (ACUTE ONLY): 23 min Past Medical History: Past Medical History: Diagnosis Date  Allergy   Aortic atherosclerosis ([place])   Arthritis   Diabetes mellitus   type 2   Diverticulosis   Dyspepsia   Fecal incontinence   Gastritis   GERD (gastroesophageal reflux  disease)   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/2016  Possible related to pyridium  G6PD studies pending  Hemorrhoids   Hiatal hernia   Hyperlipidemia   Hyperlipidemia   Hypertension   Hypothyroidism   IBS (irritable bowel syndrome)   Iron overload 07/13/2016  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  Osteoporosis   UTI (lower urinary tract infection)   Vitamin D deficiency  Past Surgical History: Past Surgical History: Procedure Laterality Date  CHOLECYSTECTOMY N/A 03/31/2022  Procedure: LAPAROSCOPIC CHOLECYSTECTOMY;  Surgeon: Ralene Ok, MD;  Location: [facility];  Service: General;  Laterality: N/A;  COLONOSCOPY    ERCP N/A 03/29/2022  Procedure: ENDOSCOPIC RETROGRADE CHOLANGIOPANCREATOGRAPHY (ERCP);  Surgeon: Gatha Mayer, MD;  Location: [facility];  Service: Gastroenterology;  Laterality: N/A;  PARTIAL KNEE ARTHROPLASTY Left 02/25/2020  Procedure: UNICOMPARTMENTAL KNEE;  Surgeon: Gaynelle Arabian, MD;  Location: [facility];  Service: Orthopedics;  Laterality: Left;  86mn  REMOVAL OF STONES  03/29/2022  Procedure: REMOVAL OF STONES;  Surgeon: GGatha Mayer MD;  [facility];  Service: Gastroenterology;;  SJoan Mayans 03/29/2022  Procedure: SJoan Mayans  Surgeon: GGatha Mayer MD;  Location: [facility];  Service: Gastroenterology;; HPI: Patient is an 82y.o. female with PMH: OA, DM, GERD, HLD, HTN, hypothyroidism, osteoporosis, Lt [facility]A in 2021. She presented to the hospital on 03/27/22 with abdominal pain and several days of poor PO intake. She was found to have cholangitis vs cholecystitis. She underwent an ERCP on 1/15 and a laparoscopic cholecystectomy on 03/31/22. Her granddaughter reports intermittent coughing spells when eating/drinking.  Subjective: pleasant  Recommendations for follow up therapy are one component of a multi-disciplinary discharge planning process, led by the attending physician.  Recommendations may be updated based on patient status, additional functional criteria and insurance  authorization. Assessment / Plan / Recommendation   04/06/2022  12:00 PM Clinical Impressions Clinical Impression Pt presents with functional oropharyngeal swallow. She was reluctant to eat/drink, stating she was full and had just finished lunch. Granddaughter was present for interpretation.  She demonstrated some oral holding of POs, generally relating to her reluctance as opposed to a physiological impairment.  There was brisk onset of the swallow, normal pharyngeal stripping and base-of-tongue retraction (no residue), reliable laryngeal vestibule closure and no aspiration. There was only one instance of transient penetration of thin liquids into the vestibule when she was swallowing a pill (PAS score of 2, considered [facility] be [facility]. Discussed results with pt/granddaughter. No dysphagia identified. She may advance to regular solids or remain on dysphagia 3 while admitted according to her preferences. May have thin liquids and can take pills whole with water. No further SLP f/u is needed,. SLP Visit Diagnosis Dysphagia, unspecified (R13.10) Impact on safety and function No limitations     04/06/2022  12:00 PM Treatment Recommendations Treatment Recommendations No treatment recommended at this time     04/05/2022   3:53 PM Prognosis Prognosis for Safe Diet Advancement Good  04/06/2022  12:00 PM Diet Recommendations SLP Diet Recommendations Regular solids;Dysphagia 3 (Mech soft) solids;Thin liquid Liquid Administration via Cup;Straw Medication Administration Whole meds with liquid     04/06/2022  12:00 PM Other Recommendations Oral Care Recommendations Oral care BID Follow Up Recommendations No SLP follow up   04/05/2022   3:53 PM Frequency and Duration  Speech Therapy Frequency (ACUTE ONLY) min 1 x/week     04/06/2022  12:00 PM Oral Phase Oral Phase [facility]    04/06/2022  12:00 PM Pharyngeal Phase Pharyngeal Phase WFL     No data to display    Juan Quam Laurice 04/06/2022, 1:45 PM                     MR ABDOMEN MRCP W WO  CONTAST  Result Date: 04/06/2022 CLINICAL DATA:  82 year old female with history of elevated liver function tests. Status post cholecystectomy. EXAM: MRI ABDOMEN WITHOUT AND WITH CONTRAST (INCLUDING MRCP) TECHNIQUE: Multiplanar multisequence MR imaging of the abdomen was performed both before and after the administration of intravenous contrast. Heavily T2-weighted images of the biliary and pancreatic ducts were obtained, and three-dimensional MRCP images were rendered by post processing. CONTRAST:  12m GADAVIST GADOBUTROL 1 MMOL/ML IV SOLN COMPARISON:  Abdominal MRI/MRCP 03/28/2022. CT of the abdomen and pelvis 04/04/2022. FINDINGS: Lower chest: Moderate bilateral pleural effusions lying dependently. Extensive passive subsegmental atelectasis noted in the lower lobes of the lungs bilaterally. Hepatobiliary: Scattered areas of loss of signal intensity throughout the hepatic parenchyma on out of phase dual echo images, indicative of heterogeneous hepatic steatosis. Heterogeneous perfusion of the liver noted during early post gadolinium phases, which normalize on delayed post gadolinium phases. In the periphery of the right lobe of the liver there are 2 lesions (axial image 43 of series 21) which demonstrate peripheral rim hyperenhancement and central hypoenhancement on post gadolinium imaging. The largest of these is in the inferior aspect of segment 5 (axial image 21 of series 7 and coronal image 10 of series 6) measures 2.2 x 1.6 x 2.1 cm, and is centrally slightly low T1 signal intensity, heterogeneous T2 signal intensity, with peripheral rim enhancement and diffusion restriction, larger than the prior study, compatible with an intrahepatic abscess. The smaller lesion is less well-defined (best appreciated on axial image 43 of series 21) measuring 1.1 x 0.6 cm, also in segment 5, demonstrating T1 hypointensity, mild diffuse T2 hyperintensity, and peripheral enhancement on post gadolinium imaging, also likely a  small abscess. No other definite hepatic lesions are confidently identified. Pancreas: No pancreatic mass. No pancreatic ductal dilatation on MRCP images. No pancreatic or peripancreatic fluid collections or inflammatory changes. Spleen:  Unremarkable. Adrenals/Urinary Tract: 5 mm T1 hypointense, T2 hyperintense, nonenhancing lesion in the anterior aspect of the interpolar region of the left kidney, compatible with a simple (Bosniak class 1) cyst, which requires no imaging follow-up. Right kidney and bilateral adrenal glands are otherwise normal in appearance. No hydroureteronephrosis in the visualized portions of the abdomen. Stomach/Bowel: Visualized portions are unremarkable. Vascular/Lymphatic: No aneurysm identified in the visualized abdominal vasculature. No lymphadenopathy noted in the abdomen. Other:  Trace volume of perihepatic ascites. Musculoskeletal: No aggressive appearing osseous lesions are noted in the visualized portions of the skeleton. Diffuse body wall edema. IMPRESSION: 1. Two lesions in segment 5 of the liver, as above, with imaging characteristics compatible with intrahepatic abscesses. 2. Heterogeneous hepatic steatosis. 3. Moderate bilateral pleural effusions with extensive passive atelectasis in the lower lobes of the lungs bilaterally. 4. A  trace volume of perihepatic ascites. 5. Diffuse body wall edema. 6. Additional incidental findings, as above. Electronically Signed   By: Vinnie Langton M.D.   On: 04/06/2022 08:17   MR 3D Recon At Scanner  Result Date: 04/06/2022 CLINICAL DATA:  82 year old female with history of elevated liver function tests. Status post cholecystectomy. EXAM: MRI ABDOMEN WITHOUT AND WITH CONTRAST (INCLUDING MRCP) TECHNIQUE: Multiplanar multisequence MR imaging of the abdomen was performed both before and after the administration of intravenous contrast. Heavily T2-weighted images of the biliary and pancreatic ducts were obtained, and three-dimensional MRCP  images were rendered by post processing. CONTRAST:  51m GADAVIST GADOBUTROL 1 MMOL/ML IV SOLN COMPARISON:  Abdominal MRI/MRCP 03/28/2022. CT of the abdomen and pelvis 04/04/2022. FINDINGS: Lower chest: Moderate bilateral pleural effusions lying dependently. Extensive passive subsegmental atelectasis noted in the lower lobes of the lungs bilaterally. Hepatobiliary: Scattered areas of loss of signal intensity throughout the hepatic parenchyma on out of phase dual echo images, indicative of heterogeneous hepatic steatosis. Heterogeneous perfusion of the liver noted during early post gadolinium phases, which normalize on delayed post gadolinium phases. In the periphery of the right lobe of the liver there are 2 lesions (axial image 43 of series 21) which demonstrate peripheral rim hyperenhancement and central hypoenhancement on post gadolinium imaging. The largest of these is in the inferior aspect of segment 5 (axial image 21 of series 7 and coronal image 10 of series 6) measures 2.2 x 1.6 x 2.1 cm, and is centrally slightly low T1 signal intensity, heterogeneous T2 signal intensity, with peripheral rim enhancement and diffusion restriction, larger than the prior study, compatible with an intrahepatic abscess. The smaller lesion is less well-defined (best appreciated on axial image 43 of series 21) measuring 1.1 x 0.6 cm, also in segment 5, demonstrating T1 hypointensity, mild diffuse T2 hyperintensity, and peripheral enhancement on post gadolinium imaging, also likely a small abscess. No other definite hepatic lesions are confidently identified. Pancreas: No pancreatic mass. No pancreatic ductal dilatation on MRCP images. No pancreatic or peripancreatic fluid collections or inflammatory changes. Spleen:  Unremarkable. Adrenals/Urinary Tract: 5 mm T1 hypointense, T2 hyperintense, nonenhancing lesion in the anterior aspect of the interpolar region of the left kidney, compatible with a simple (Bosniak class 1) cyst,  which requires no imaging follow-up. Right kidney and bilateral adrenal glands are otherwise normal in appearance. No hydroureteronephrosis in the visualized portions of the abdomen. Stomach/Bowel: Visualized portions are unremarkable. Vascular/Lymphatic: No aneurysm identified in the visualized abdominal vasculature. No lymphadenopathy noted in the abdomen. Other:  Trace volume of perihepatic ascites. Musculoskeletal: No aggressive appearing osseous lesions are noted in the visualized portions of the skeleton. Diffuse body wall edema. IMPRESSION: 1. Two lesions in segment 5 of the liver, as above, with imaging characteristics compatible with intrahepatic abscesses. 2. Heterogeneous hepatic steatosis. 3. Moderate bilateral pleural effusions with extensive passive atelectasis in the lower lobes of the lungs bilaterally. 4. A trace volume of perihepatic ascites. 5. Diffuse body wall edema. 6. Additional incidental findings, as above. Electronically Signed   By: DVinnie LangtonM.D.   On: 04/06/2022 08:17   CT ABDOMEN PELVIS W CONTRAST  Result Date: 04/04/2022 CLINICAL DATA:  Abdominal pain.  Postop cholecystectomy. EXAM: CT ABDOMEN AND PELVIS WITH CONTRAST TECHNIQUE: Multidetector CT imaging of the abdomen and pelvis was performed using the standard protocol following bolus administration of intravenous contrast. RADIATION DOSE REDUCTION: This exam was performed according to the departmental dose-optimization program which includes automated exposure control, adjustment of the mA  and/or kV according to patient size and/or use of iterative reconstruction technique. CONTRAST:  185m OMNIPAQUE IOHEXOL 300 MG/ML  SOLN COMPARISON:  03/27/2022, 11/21/2020, MRI 03/28/2022 FINDINGS: Lower chest: 3 mm nodule over the right middle lobe unchanged. Calcified mediastinal and right hilar lymph nodes. New moderate size bilateral pleural effusions with associated bibasilar atelectasis. Calcified plaque over the descending  thoracic aorta. Heart is normal size. Hepatobiliary: Postsurgical change compatible with recent cholecystectomy. Minimal pneumobilia over the central and left liver. Air over the common bile duct. Mild diffuse low-attenuation of the liver with geographic peripheral areas of slight increased density possibly due to differential perfusion. Two small peripheral round/oval hypodensities over the right lobe of the liver with the larger measuring 1.6 cm as these were not seen on previous CTs, although present on recent MRI. These are indeterminate and may represent small abscesses. Pancreas: Normal. Spleen: Multiple calcified granulomas, otherwise unremarkable. Adrenals/Urinary Tract: Adrenal glands are normal. Kidneys are normal in size without hydronephrosis or nephrolithiasis. Bladder is decompressed with presence of Foley catheter. Visualized ureters are unremarkable. Stomach/Bowel: Stomach and small bowel are normal. Appendix is unremarkable. Colon is unremarkable. Vascular/Lymphatic: Mild calcified plaque over the abdominal aorta which is normal in caliber. Remaining vascular structures are unremarkable. No adenopathy. Reproductive: Normal. Other: Mild free fluid in the abdomen/pelvis likely postsurgical. Mild diffuse subcutaneous edema over the abdominal/pelvic wall. Musculoskeletal: No focal abnormality. IMPRESSION: 1. Postsurgical change compatible with recent cholecystectomy. Two small peripheral round/oval hypodensities over the right lobe of the liver with the larger measuring 1.6 cm as these were not seen on previous CTS, although present on recent MRI. These are indeterminate and may represent small abscesses. Recommend attention on follow-up. 2. New moderate size bilateral pleural effusions with associated bibasilar atelectasis. 3. Aortic atherosclerosis. Aortic Atherosclerosis (ICD10-I70.0). Electronically Signed   By: DMarin OlpM.D.   On: 04/04/2022 13:36   DG Chest 2 View  Result Date:  04/04/2022 CLINICAL DATA:  Leukocytosis.  Prior laparoscopic cholecystectomy. EXAM: CHEST - 2 VIEW COMPARISON:  None FINDINGS: Central venous catheter tip projects over the superior vena cava. Monitoring leads overlie the patient. Cardiac contours upper limits of normal. Probable small to moderate layering bilateral pleural effusions and underlying consolidation. No pneumothorax. Thoracic spine degenerative changes. Probable calcified nodes within the mediastinum. IMPRESSION: Probable small to moderate layering bilateral pleural effusions with underlying consolidation. Electronically Signed   By: DLovey NewcomerM.D.   On: 04/04/2022 13:22   DG ERCP  Result Date: 03/29/2022 CLINICAL DATA:  ERCP EXAM: ERCP TECHNIQUE: Multiple spot images obtained with the fluoroscopic device and submitted for interpretation post-procedure. FLUOROSCOPY TIME: FLUOROSCOPY TIME 1 minute, 10 seconds (30.7 mGy) COMPARISON:  MRCP-03/28/2022 FINDINGS: Six spot intraoperative fluoroscopic images of the right upper abdominal quadrant during ERCP are provided for review. Initial image demonstrates an ERCP probe overlying the right upper abdominal quadrant. Subsequent images demonstrate selective cannulation and opacification of the common bile duct which appears mild to moderately dilated and potentially slightly irregular though this is potentially artifactual due to opacification. Subsequent images demonstrate insufflation of a balloon within the central aspect of the CBD with subsequent biliary sweeping and presumed sphincterotomy. There is minimal opacification of the intrahepatic biliary tree which appears mildly dilated. There is no definitive opacification of either the cystic or pancreatic ducts. IMPRESSION: ERCP with biliary sweeping and presumed sphincterotomy as above. These images were submitted for radiologic interpretation only. Please see the procedural report for the amount of contrast and the fluoroscopy time utilized.  Electronically  Signed   By: Sandi Mariscal M.D.   On: 03/29/2022 14:13   MR ABDOMEN MRCP W WO CONTAST  Result Date: 03/28/2022 CLINICAL DATA:  Severe epigastric pain. Elevated liver function tests. Biliary ductal dilatation on recent ultrasound. EXAM: MRI ABDOMEN WITHOUT AND WITH CONTRAST (INCLUDING MRCP) TECHNIQUE: Multiplanar multisequence MR imaging of the abdomen was performed both before and after the administration of intravenous contrast. Heavily T2-weighted images of the biliary and pancreatic ducts were obtained, and three-dimensional MRCP images were rendered by post processing. CONTRAST:  21m GADAVIST GADOBUTROL 1 MMOL/ML IV SOLN COMPARISON:  Ultrasound on 03/28/2022 and CT on 03/27/2022 FINDINGS: Lower chest: Mild dependent bibasilar atelectasis. Hepatobiliary: Several small subcapsular wedge-shaped signal abnormalities are seen in the anterior segment of the right hepatic lobe and medial segment of the left hepatic lobe, which show moderate T2 hyperintensity and lack of contrast enhancement. These are nonspecific and could represent small hepatic infarcts or abscesses. Mild diffuse hepatic steatosis is noted. Mild perihepatic ascites is seen. Gallbladder is mildly distended, and shows mild wall thickening and pericholecystic edema. These findings are suspicious for acute cholecystitis. Mild dilatation of the central intrahepatic bile ducts and common bile duct is seen the level of the ampulla, with common bile duct measuring up to 10 mm. Diffuse T2 hypointensity is noted within the common duct suspicious for sludge, however no discrete common duct calculi are identified. Pancreas: No mass or inflammatory changes. No evidence of pancreatic ductal dilatation or pancreas divisum. Spleen:  Within normal limits in size and appearance. Adrenals/Urinary Tract: No suspicious masses identified. No evidence of hydronephrosis. Stomach/Bowel: Unremarkable. Vascular/Lymphatic: No pathologically enlarged lymph nodes  identified. No acute vascular findings. Other:  None. Musculoskeletal:  No suspicious bone lesions identified. IMPRESSION: Mild gallbladder wall thickening and pericholecystic edema, highly suspicious for acute cholecystitis. Mild dilatation of the central intrahepatic bile ducts and common bile duct to the level of the ampulla. T2 hypointense sludge seen throughout the common bile duct, without definite calculi. Cholangitis cannot be excluded. Several small subcapsular wedge-shaped signal abnormalities in the peripheral right and left hepatic lobes. These are nonspecific, and differential diagnosis includes small hepatic infarcts or abscesses. Mild hepatic steatosis.  Mild perihepatic ascites. Electronically Signed   By: JMarlaine HindM.D.   On: 03/28/2022 15:17   MR 3D Recon At Scanner  Result Date: 03/28/2022 CLINICAL DATA:  Severe epigastric pain. Elevated liver function tests. Biliary ductal dilatation on recent ultrasound. EXAM: MRI ABDOMEN WITHOUT AND WITH CONTRAST (INCLUDING MRCP) TECHNIQUE: Multiplanar multisequence MR imaging of the abdomen was performed both before and after the administration of intravenous contrast. Heavily T2-weighted images of the biliary and pancreatic ducts were obtained, and three-dimensional MRCP images were rendered by post processing. CONTRAST:  519mGADAVIST GADOBUTROL 1 MMOL/ML IV SOLN COMPARISON:  Ultrasound on 03/28/2022 and CT on 03/27/2022 FINDINGS: Lower chest: Mild dependent bibasilar atelectasis. Hepatobiliary: Several small subcapsular wedge-shaped signal abnormalities are seen in the anterior segment of the right hepatic lobe and medial segment of the left hepatic lobe, which show moderate T2 hyperintensity and lack of contrast enhancement. These are nonspecific and could represent small hepatic infarcts or abscesses. Mild diffuse hepatic steatosis is noted. Mild perihepatic ascites is seen. Gallbladder is mildly distended, and shows mild wall thickening and  pericholecystic edema. These findings are suspicious for acute cholecystitis. Mild dilatation of the central intrahepatic bile ducts and common bile duct is seen the level of the ampulla, with common bile duct measuring up to 10 mm. Diffuse T2 hypointensity  is noted within the common duct suspicious for sludge, however no discrete common duct calculi are identified. Pancreas: No mass or inflammatory changes. No evidence of pancreatic ductal dilatation or pancreas divisum. Spleen:  Within normal limits in size and appearance. Adrenals/Urinary Tract: No suspicious masses identified. No evidence of hydronephrosis. Stomach/Bowel: Unremarkable. Vascular/Lymphatic: No pathologically enlarged lymph nodes identified. No acute vascular findings. Other:  None. Musculoskeletal:  No suspicious bone lesions identified. IMPRESSION: Mild gallbladder wall thickening and pericholecystic edema, highly suspicious for acute cholecystitis. Mild dilatation of the central intrahepatic bile ducts and common bile duct to the level of the ampulla. T2 hypointense sludge seen throughout the common bile duct, without definite calculi. Cholangitis cannot be excluded. Several small subcapsular wedge-shaped signal abnormalities in the peripheral right and left hepatic lobes. These are nonspecific, and differential diagnosis includes small hepatic infarcts or abscesses. Mild hepatic steatosis.  Mild perihepatic ascites. Electronically Signed   By: Marlaine Hind M.D.   On: 03/28/2022 15:17   US Abdomen Limited RUQ (LIVER/GB)  Result Date: 03/28/2022 CLINICAL DATA:  Acute severe epigastric pain. Elevated liver function tests. EXAM: ULTRASOUND ABDOMEN LIMITED RIGHT UPPER QUADRANT COMPARISON:  CTA on 03/27/2022 FINDINGS: Gallbladder: Small amount of echogenic sludge seen within the gallbladder, however no definite gallstones identified. Mild diffuse gallbladder wall thickening is seen measuring to 5 mm. No sonographic Murphy sign noted by  sonographer. Common bile duct: Diameter: 10 mm, which is mildly dilated. Echogenic sludge noted within the common bile duct, however no definite intraductal calculi are seen by ultrasound. No evidence of intrahepatic biliary ductal dilatation. Liver: Mildly increased echogenicity of the hepatic parenchyma, consistent with hepatic steatosis. No hepatic mass identified. Portal vein is patent on color Doppler imaging with normal direction of blood flow towards the liver. Other: None. IMPRESSION: Small amount of gallbladder sludge, however no definite gallstones identified. Mild diffuse gallbladder wall thickening, which is nonspecific. This could be secondary to hepatocellular disease, although cholecystitis cannot be excluded. Recommend clinical correlation and consider nuclear medicine hepatobiliary scan if clinically warranted. Mild dilatation of common bile duct measuring 10 mm, which contains sludge. Consider further evaluation with abdomen MRI and MRCP without and with contrast to evaluate for choledocholithiasis if clinically warranted. Mild hepatic steatosis. Electronically Signed   By: Marlaine Hind M.D.   On: 03/28/2022 10:04   CT Angio Chest/Abd/Pel for Dissection W and/or Wo Contrast  Result Date: 03/27/2022 CLINICAL DATA:  Acute aortic syndrome (AAS) suspected severe epigastric pain EXAM: CT ANGIOGRAPHY CHEST, ABDOMEN AND PELVIS TECHNIQUE: Non-contrast CT of the chest was initially obtained. Multidetector CT imaging through the chest, abdomen and pelvis was performed using the standard protocol during bolus administration of intravenous contrast. Multiplanar reconstructed images and MIPs were obtained and reviewed to evaluate the vascular anatomy. RADIATION DOSE REDUCTION: This exam was performed according to the departmental dose-optimization program which includes automated exposure control, adjustment of the mA and/or kV according to patient size and/or use of iterative reconstruction technique.  CONTRAST:  189m OMNIPAQUE IOHEXOL 350 MG/ML SOLN COMPARISON:  11/21/2020 FINDINGS: CTA CHEST FINDINGS Cardiovascular: Heart is normal size. Aorta is normal caliber. No evidence of aortic dissection. No pulmonary embolus. Scattered coronary artery and aortic calcifications. Mediastinum/Nodes: Calcified mediastinal lymph nodes. No mediastinal, hilar, or axillary adenopathy. Trachea and esophagus are unremarkable. Thyroid unremarkable. Lungs/Pleura: Lungs are clear. No focal airspace opacities or suspicious nodules. No effusions. Musculoskeletal: Chest wall soft tissues are unremarkable. No acute bony abnormality. Review of the MIP images confirms the above findings. CTA ABDOMEN  AND PELVIS FINDINGS VASCULAR Aorta: Aortic calcifications.  No aneurysm or dissection. Celiac: Mild narrowing at the origin.  No aneurysm or dissection. SMA: Widely patent Renals: Widely patent IMA: Widely patent Inflow: Atherosclerotic calcifications.  No aneurysm or dissection. Veins: No obvious venous abnormality within the limitations of this arterial phase study. Review of the MIP images confirms the above findings. NON-VASCULAR Hepatobiliary: Low-density throughout the liver compatible with fatty infiltration. Gallbladder unremarkable. No biliary ductal dilatation. Pancreas: No focal abnormality or ductal dilatation. Spleen: Calcifications throughout the spleen compatible with old granulomatous disease. Normal size. Adrenals/Urinary Tract: No adrenal abnormality. No focal renal abnormality. No stones or hydronephrosis. Urinary bladder is unremarkable. Stomach/Bowel: Stomach, large and small bowel grossly unremarkable. Lymphatic: No adenopathy Reproductive: Uterus and adnexa unremarkable.  No mass. Other: No free fluid or free air. Musculoskeletal: No acute bony abnormality. Review of the MIP images confirms the above findings. IMPRESSION: No evidence of aortic aneurysm or dissection. Aortic atherosclerosis. Old granulomatous disease. No  acute findings in the chest, abdomen or pelvis. Hepatic steatosis. Electronically Signed   By: Rolm Baptise M.D.   On: 03/27/2022 21:14   CT Head Wo Contrast  Result Date: 03/12/2022 CLINICAL DATA:  New onset headache EXAM: CT HEAD WITHOUT CONTRAST TECHNIQUE: Contiguous axial images were obtained from the base of the skull through the vertex without intravenous contrast. RADIATION DOSE REDUCTION: This exam was performed according to the departmental dose-optimization program which includes automated exposure control, adjustment of the mA and/or kV according to patient size and/or use of iterative reconstruction technique. COMPARISON:  12/13/2014 FINDINGS: Brain: No mass, hemorrhage or extra-axial collection. Mild volume loss. There is periventricular hypoattenuation compatible with chronic microvascular disease. Vascular: Calcific atherosclerosis at the skull base. Skull: Normal. Negative for fracture or focal lesion. Sinuses/Orbits: No acute finding. Other: None IMPRESSION: 1. No acute intracranial abnormality. 2. Mild volume loss and chronic microvascular disease. Electronically Signed   By: Ulyses Jarred M.D.   On: 03/12/2022 02:34   (Echo, Carotid, EGD, Colonoscopy, ERCP)    Subjective: No complaints  Discharge Exam: Vitals:   04/08/22 2204 04/09/22 0557  BP: (!) 145/52 (!) 148/64  Pulse: 73 72  Resp: 16 18  Temp: 99.1 F (37.3 C) 98.6 F (37 C)  SpO2: 100% 98%   Vitals:   04/08/22 0631 04/08/22 1348 04/08/22 2204 04/09/22 0557  BP: 139/74 (!) 147/55 (!) 145/52 (!) 148/64  Pulse: 81 80 73 72  Resp: '15 17 16 18  '$ Temp: 98.3 F (36.8 C) 99.4 F (37.4 C) 99.1 F (37.3 C) 98.6 F (37 C)  TempSrc: Oral Oral Oral Oral  SpO2: 99% 98% 100% 98%  Weight:      Height:        General: Pt is alert, awake, not in acute distress Cardiovascular: RRR, S1/S2 +, no rubs, no gallops Respiratory: CTA bilaterally, no wheezing, no rhonchi Abdominal: Soft, NT, ND, bowel sounds + Extremities: no  edema, no cyanosis    The results of significant diagnostics from this hospitalization (including imaging, microbiology, ancillary and laboratory) are listed below for reference.     Microbiology: Recent Results (from the past 240 hour(s))  Culture, blood (Routine X 2) w Reflex to ID Panel     Status: None (Preliminary result)   Collection Time: 04/04/22 10:27 AM   Specimen: BLOOD  Result Value Ref Range Status   Specimen Description   Final    BLOOD LEFT ANTECUBITAL Performed at [facility] [address] [place], [place]  [address]    Special Requests   Final    BOTTLES DRAWN AEROBIC ONLY Blood Culture adequate volume Performed at [facility] [address] [place], [place] [address]    Culture   Final    NO GROWTH 4 DAYS Performed at [facility], [place] [address], [place], [place] [address]    Report Status PENDING  Incomplete  Culture, blood (Routine X 2) w Reflex to ID Panel     Status: None (Preliminary result)   Collection Time: 04/04/22 10:27 AM   Specimen: BLOOD  Result Value Ref Range Status   Specimen Description   Final    BLOOD RIGHT ANTECUBITAL Performed at [facility] [address] [place], [place] [address]    Special Requests   Final    BOTTLES DRAWN AEROBIC ONLY Blood Culture adequate volume Performed at [facility] [address] [place], [place] [address]    Culture   Final    NO GROWTH 4 DAYS Performed at [facility], [place] [address], [place], [place] [address]    Report Status PENDING  Incomplete     Labs: BNP (last 3 results) No results for input(s): "BNP" in the last 8760 hours. Basic Metabolic Panel: Recent Labs  Lab 04/03/22 0445 04/04/22 0256 04/05/22 0350 04/06/22 0408 04/07/22 0408 04/08/22 0235 04/09/22 0233  NA 136 130* 131* 132* 133* 132* 134*  K 2.5* 3.6 3.7 2.9* 3.9 3.3* 4.4  CL 95* 93* 94* 96* 96* 97* 101  CO2 '29 26 26 26 22  26 23  '$ GLUCOSE 128* 153* 128* 125* 108* 137* 113*  BUN '15 15 14 11 8 10 9  '$ CREATININE 0.78 0.86 0.81 0.74 0.58 0.69 0.53  CALCIUM 7.9* 7.2* 7.3* 7.5* 8.1* 8.1* 8.5*  MG 1.5* 2.1 1.8 1.5* 2.1  --   --    Liver Function Tests: Recent Labs  Lab 04/05/22 0350 04/06/22 0408 04/07/22 0408 04/08/22 0235 04/09/22 0233  AST 1,983* 1,256* 512* 159* 71*  ALT 1,283* 980* 806* 419* 347*  ALKPHOS 108 79 86 89 87  BILITOT 1.8* 1.6* 1.8* 1.3* 1.3*  PROT 5.0* 5.1* 6.1* 5.5* 5.4*  ALBUMIN 2.7* 2.7* 3.9 3.3* 3.3*   Recent Labs  Lab 04/05/22 0830 04/06/22 0408  LIPASE 389* 362*   No results for input(s): "AMMONIA" in the last 168 hours. CBC: Recent Labs  Lab 04/03/22 0445 04/04/22 0256 04/05/22 0350 04/06/22 0408 04/07/22 0408  WBC 15.3* 21.5* 17.9* 9.7 11.2*  NEUTROABS 11.9* 17.9* 14.8* 7.4 8.7*  HGB 8.9* 10.1* 8.9* 8.3* 8.2*  HCT 25.0* 28.7* 25.2* 23.3* 23.5*  MCV 85.9 86.7 85.7 86.0 88.3  PLT 76* 120* 141* 148* 174   Cardiac Enzymes: No results for input(s): "CKTOTAL", "CKMB", "CKMBINDEX", "TROPONINI" in the last 168 hours. BNP: Invalid input(s): "POCBNP" CBG: Recent Labs  Lab 04/08/22 0818 04/08/22 1159 04/08/22 1621 04/08/22 2204 04/09/22 0723  GLUCAP 129* 214* 228* 105* 121*   D-Dimer No results for input(s): "DDIMER" in the last 72 hours. Hgb A1c No results for input(s): "HGBA1C" in the last 72 hours. Lipid Profile No results for input(s): "CHOL", "HDL", "LDLCALC", "TRIG", "CHOLHDL", "LDLDIRECT" in the last 72 hours. Thyroid function studies No results for input(s): "TSH", "T4TOTAL", "T3FREE", "THYROIDAB" in the last 72 hours.  Invalid input(s): "FREET3" Anemia work up No results for input(s): "VITAMINB12", "FOLATE", "FERRITIN", "TIBC", "IRON", "RETICCTPCT" in the last 72 hours. Urinalysis    Component Value Date/Time   COLORURINE  YELLOW 03/27/2022 1945   APPEARANCEUR HAZY (A) 03/27/2022 1945   LABSPEC 1.015 03/27/2022 1945   PHURINE 8.5 (H) 03/27/2022  1945   GLUCOSEU NEGATIVE 03/27/2022 1945   HGBUR NEGATIVE 03/27/2022 1945   BILIRUBINUR NEGATIVE 03/27/2022 1945   BILIRUBINUR negative 01/31/2015 1312   BILIRUBINUR neg 12/31/2011 1021   KETONESUR 15 (A) 03/27/2022 1945   PROTEINUR 30 (A) 03/27/2022 1945   UROBILINOGEN 0.2 12/23/2018 1345   NITRITE POSITIVE (A) 03/27/2022 1945   LEUKOCYTESUR NEGATIVE 03/27/2022 1945   Sepsis Labs Recent Labs  Lab 04/04/22 0256 04/05/22 0350 04/06/22 0408 04/07/22 0408  WBC 21.5* 17.9* 9.7 11.2*   Microbiology Recent Results (from the past 240 hour(s))  Culture, blood (Routine X 2) w Reflex to ID Panel     Status: None (Preliminary result)   Collection Time: 04/04/22 10:27 AM   Specimen: BLOOD  Result Value Ref Range Status   Specimen Description   Final    BLOOD LEFT ANTECUBITAL Performed at [facility], [place] [address], [place], [place] [address]    Special Requests   Final    BOTTLES DRAWN AEROBIC ONLY Blood Culture adequate volume Performed at [facility] [address] [place], [place] [address]    Culture   Final    NO GROWTH 4 DAYS Performed at [facility], [place] [address] [place], [place] [address]    Report Status PENDING  Incomplete  Culture, blood (Routine X 2) w Reflex to ID Panel     Status: None (Preliminary result)   Collection Time: 04/04/22 10:27 AM   Specimen: BLOOD  Result Value Ref Range Status   Specimen Description   Final    BLOOD RIGHT ANTECUBITAL Performed at [facility] [address], [place], [place] [address]    Special Requests   Final    BOTTLES DRAWN AEROBIC ONLY Blood Culture adequate volume Performed at [facility] [address] [place], [place] [address]    Culture   Final    NO GROWTH 4 DAYS Performed at [facility], [place] [address], [place],  [address]    Report Status PENDING  Incomplete    SIGNED:   Charlynne Cousins, MD  [facility] 04/09/2022, 8:55 AM Pager   If 7PM-7AM, please contact night-coverage [email or web address] Password [email or web address]

## 2022-04-09 NOTE — TOC Transition Note (Signed)
Transition of Care [facility]) - CM/SW Discharge Note   Patient Details  Name: Lauren [name] MRN: [id number] Date of Birth: 1940-09-17  Transition of Care [facility]) CM/SW Contact:  Dessa Phi, RN Phone Number: 04/09/2022, 1:44 PM   Clinical Narrative: spoke to dtr about equipment-3n1 ordered-[employer] to deliver to rm prior d/c. No further CM needs.      Final next level of care: Home w Home Health Services Barriers to Discharge: No Barriers Identified   Patient Goals and CMS Choice CMS Medicare.gov Compare Post Acute Care list provided to:: Patient Represenative (must comment) (grand dtr Apolonio Schneiders) Choice offered to / list presented to : Adult Children  Discharge Placement                         Discharge Plan and Services Additional resources added to the After Visit Summary for     Discharge Planning Services: CM Consult Post Acute Care Choice: Home Health          DME Arranged: 3-N-1 DME Agency: [employer] Date DME Agency Contacted: 04/09/22 Time DME Agency Contacted: [id number] Representative spoke with at DME Agency: [place]: PT [place]: [place] Date Portal: 04/07/22 Time [place]: 1232 Representative spoke with at [place]: [place] Determinants of Health ([place]) Interventions [place]: No Food Insecurity (03/29/2022)  Housing: Low Risk  (03/29/2022)  Transportation Needs: No Transportation Needs (03/29/2022)  Utilities: Not At Risk (03/29/2022)  Tobacco Use: Low Risk  (04/01/2022)     Readmission Risk Interventions     No data to display

## 2022-04-09 NOTE — Plan of Care (Signed)
  Problem: Health Behavior/Discharge Planning: Goal: Ability to manage health-related needs will improve Outcome: Adequate for Discharge   Problem: Clinical Measurements: Goal: Ability to maintain clinical measurements within normal limits will improve Outcome: Adequate for Discharge Goal: Will remain free from infection Outcome: Adequate for Discharge Goal: Diagnostic test results will improve Outcome: Adequate for Discharge Goal: Respiratory complications will improve Outcome: Adequate for Discharge Goal: Cardiovascular complication will be avoided Outcome: Adequate for Discharge   Problem: Activity: Goal: Risk for activity intolerance will decrease Outcome: Adequate for Discharge   Problem: Nutrition: Goal: Adequate nutrition will be maintained Outcome: Adequate for Discharge   Problem: Coping: Goal: Level of anxiety will decrease Outcome: Adequate for Discharge   Problem: Elimination: Goal: Will not experience complications related to bowel motility Outcome: Adequate for Discharge Goal: Will not experience complications related to urinary retention Outcome: Adequate for Discharge   Problem: Pain Managment: Goal: General experience of comfort will improve Outcome: Adequate for Discharge   Problem: Safety: Goal: Ability to remain free from injury will improve Outcome: Adequate for Discharge   Problem: Skin Integrity: Goal: Risk for impaired skin integrity will decrease Outcome: Adequate for Discharge   Problem: Education: Goal: Ability to describe self-care measures that may prevent or decrease complications (Diabetes Survival Skills Education) will improve Outcome: Adequate for Discharge Goal: Individualized Educational Video(s) Outcome: Adequate for Discharge   Problem: Coping: Goal: Ability to adjust to condition or change in health will improve Outcome: Adequate for Discharge   Problem: Fluid Volume: Goal: Ability to maintain a balanced intake and output  will improve Outcome: Adequate for Discharge   Problem: Health Behavior/Discharge Planning: Goal: Ability to identify and utilize available resources and services will improve Outcome: Adequate for Discharge Goal: Ability to manage health-related needs will improve Outcome: Adequate for Discharge   Problem: Metabolic: Goal: Ability to maintain appropriate glucose levels will improve Outcome: Adequate for Discharge   Problem: Nutritional: Goal: Maintenance of adequate nutrition will improve Outcome: Adequate for Discharge Goal: Progress toward achieving an optimal weight will improve Outcome: Adequate for Discharge   Problem: Skin Integrity: Goal: Risk for impaired skin integrity will decrease Outcome: Adequate for Discharge   Problem: Tissue Perfusion: Goal: Adequacy of tissue perfusion will improve Outcome: Adequate for Discharge

## 2022-04-13 ENCOUNTER — Encounter: Payer: Self-pay | Admitting: Internal Medicine

## 2022-04-13 ENCOUNTER — Other Ambulatory Visit: Payer: Self-pay

## 2022-04-13 ENCOUNTER — Ambulatory Visit (INDEPENDENT_AMBULATORY_CARE_PROVIDER_SITE_OTHER): Payer: Medicare Other | Admitting: Internal Medicine

## 2022-04-13 VITALS — BP 134/77 | HR 79 | Temp 98.0°F | Resp 16 | Ht <= 58 in | Wt 114.0 lb

## 2022-04-13 DIAGNOSIS — K750 Abscess of liver: Secondary | ICD-10-CM | POA: Diagnosis not present

## 2022-04-13 MED ORDER — AMOXICILLIN-POT CLAVULANATE 875-125 MG PO TABS: 1.0000 | ORAL_TABLET | Freq: Two times a day (BID) | ORAL | 0 refills | Status: AC

## 2022-04-13 NOTE — Progress Notes (Signed)
Patient: Lauren [name]  DOB: 1940/11/01 MRN: [id number] PCP: Jani Gravel, MD    Patient Active Problem List   Diagnosis Date Noted   Metabolic acidosis 18/56/3149   Acute cholecystitis 03/31/2022   Transaminitis 03/31/2022   Anemia of chronic disease 03/31/2022   Thrombocytopenia ([place]) 03/31/2022   Septic shock ([place]) 03/31/2022   Urinary tract infection without hematuria 03/31/2022   Shock ([place]) 03/29/2022   Ascending cholangitis 03/29/2022   Hypokalemia 03/28/2022   LFT elevation 03/28/2022   Hepatitis 03/27/2022   Chronic kidney disease, stage 2 (mild) 12/09/2020   Hearing loss 12/09/2020   Iron deficiency anemia 12/09/2020   Memory impairment 12/09/2020   Urinary retention 12/09/2020   Recurrent urinary tract infection 12/09/2020   Vertigo 12/09/2020   OA (osteoarthritis) of knee 02/25/2020   Primary osteoarthritis of left knee 02/25/2020   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 F .presents for With past medical history of seasonal allergies, aortic arthrosclerosis, hyperlipidemia, stroke osteoarthritis, diabetes type 2, diverticulosis, dyspepsia, gastritis, GERD, hiatal hernia, history of hemolytic anemia, hemorrhoids, hypertension, hypothyroidism, iron overload, microcytic anemia, osteoporosis, UTI, vitamin D deficiency admitted with hepatitis.  She presented to the ED with epigastric pain that started earlier in the  afternoon.  She found to have elevated LFTs and T. bili.  CTA did not show aneurysm, did show hepatic steatosis and all grandma toes disease explained. MRI MRCP showed mild gallbladder thickening.  Cholecystic edema, highly suspicion of acute cholecystitis.  Patient underwent ERCP on on 1/15 with finding showing copious dark bile  and some sludge with mucopurulent debris, moderate biliary sphincterotomy performed.  No cultures obtained.  Patient taken to the OR for lap chole on 1/17..  ID engaged for antibiotic recommendations.  Patient 1000 pip-tazo, noted to have Cipro allergy.  She was transition to Augmentin from lap chole EOT 1/30. Interim MRI abdomen on 1/22 showed 2 lesion 2.2 X1.6X 2.1 cm and 1.1X 0.6 cm in the liver compatible intrahepatic abscesses.  Hospital course was also complicated by interval elevated LFTs.  With AST 1983/ALT 1283.  Hepatitis panel was negative..  GI was engaged, no further workup at this elevated so downtrending, etiology of elevation unclear. Today 04/13/22: No missed dodses on antibioics.  tolerating abx, but sometimes files  a twinge on pain that is dull in quality in R quadrant. Lasts only moments, once a day. No fever chills. [place] daughter translates. Notable to provide Hx about cipro allergy.   Review of Systems  All other systems reviewed and are negative.   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     amoxicillin-clavulanate (AUGMENTIN) 875-125 MG tablet Take 1 tablet by mouth every 12 (twelve) hours for 14 days. 28 tablet 0   COVID-19 mRNA Vac-TriS, Pfizer, SUSP injection Inject into the muscle. 0.3 mL 0   glucosamine-chondroitin 500-400 MG tablet Take 1 tablet by mouth 3 (three) times daily.     glucose blood (ACCU-CHEK AVIVA PLUS) test strip Use to check blood sugar 3 times daily. 90 strip 5   levothyroxine (SYNTHROID, LEVOTHROID) 75 MCG tablet Take 75 mcg by mouth daily.     losartan (COZAAR) 50 MG tablet Take 50 mg by mouth daily.     meclizine (ANTIVERT) 25 MG tablet Take 25 mg by mouth every 12 (twelve) hours as needed for dizziness.     Multiple Vitamins-Minerals (MULTIVITAMIN WITH MINERALS) tablet Take 1 tablet by mouth daily.     omeprazole (PRILOSEC) 40 MG capsule Take 1 capsule (40 mg total) by mouth daily. (Patient taking differently: Take 40 mg by mouth daily as needed (indigestion).) 30 capsule 1   pravastatin (PRAVACHOL) 80 MG tablet Take 80 mg by mouth daily.     sitaGLIPtan-metformin (JANUMET) 50-500 MG per tablet Take 1 tablet by mouth 2 (two) times daily with a meal.     No facility-administered medications prior to visit.     Allergies  Allergen Reactions   Pyridium [Phenazopyridine Hcl] Other (See Comments)    Hemolytic anemia   Sulfa Antibiotics     Potential allergy: oxidizing drug: methemoglobinemia/hemolysis on pyridium   Ciprofloxacin Hcl     Sx's almost like a heart attack   Esomeprazole Magnesium Nausea Only    Pain and nausea   Macrodantin [Nitrofurantoin Macrocrystal] Other (See Comments)    GI Upset   Pantoprazole Sodium Nausea Only    abd pain and nausea   Tramadol Nausea Only    Social History   Tobacco Use   Smoking status: Never   Smokeless  tobacco: Never  Substance Use Topics   Alcohol use: No   Drug use: No    Family History  Problem Relation Age of Onset   Colon cancer Neg Hx     Objective:  There were no vitals filed for this visit. There is no height or weight on file to calculate BMI.  Physical Exam Constitutional:      Appearance: Normal appearance.  HENT:     Head: Normocephalic and atraumatic.     Right Ear: Tympanic membrane normal.     Left Ear: Tympanic membrane normal.     Nose: Nose normal.     Mouth/Throat:     Mouth: Mucous membranes are moist.  Eyes:     Extraocular Movements: Extraocular movements intact.     Conjunctiva/sclera: Conjunctivae normal.     Pupils: Pupils are equal, round, and reactive to light.  Cardiovascular:     Rate and Rhythm: Normal rate and regular rhythm.  Heart sounds: No murmur heard.    No friction rub. No gallop.  Pulmonary:     Effort: Pulmonary effort is normal.     Breath sounds: Normal breath sounds.  Abdominal:     General: Abdomen is flat.     Palpations: Abdomen is soft.     Comments: Abdominal lap wounds helaing  Musculoskeletal:        General: Normal range of motion.  Skin:    General: Skin is dry.     Coloration: Skin is jaundiced.  Neurological:     General: No focal deficit present.     Mental Status: She is alert and oriented to person, place, and time.  Psychiatric:        Mood and Affect: Mood normal.     Lab Results: Lab Results  Component Value Date   WBC 11.2 (H) 04/07/2022   HGB 8.2 (L) 04/07/2022   HCT 23.5 (L) 04/07/2022   MCV 88.3 04/07/2022   PLT 174 04/07/2022    Lab Results  Component Value Date   CREATININE 0.53 04/09/2022   BUN 9 04/09/2022   NA 134 (L) 04/09/2022   K 4.4 04/09/2022   CL 101 04/09/2022   CO2 23 04/09/2022    Lab Results  Component Value Date   ALT 347 (H) 04/09/2022   AST 71 (H) 04/09/2022   GGT 415 (H) 03/27/2022   ALKPHOS 87 04/09/2022   BILITOT 1.3 (H) 04/09/2022     Assessment &  Plan:  82 year old female presents for hospital follow-up of hepatic abscess #Ascending cholangitis status post ERCP followed by lap chole on 3/55 complicated by hepatic abscess - Patient was admitted initially with elevated LFTs AST/ALT/ALP 258/350/100, T. bili 2.8.  MRCP showed mild gallbladder thickening, cholecystic edema , highly suspicion of acute cholecystitis.  Patient underwent ERCP on 1/15 showing mucopurulent debris, moderate biliary sphincterectomy performed.  Some sludge noted with copious dark bile. - Underwent lap chole 1/17.  Infectious diseases engaged patient transition from pip-tazo to Augmentin for 2 weeks. [facility] course was then complicated by elevated liver enzymes on 1/22 AST/ALT was 1983/1280  GI was engaged.  Patient underwent MRI abdomen that showed 2 lesions compatible with peptic hepatic abscess in segment 5 measuring 2.2 X1.6X 2.1 cm and 1.1X 0.6 cm.  Acute hep panel negative. Etiology of elevated LFTs unclear but they trended down by discharge. - Today patient was doing well, clinically stable.  No persistent abdominal pain.  No fevers no chills.  Plan on 4 weeks antibiotics given hepatic abscesses.  If she is clinically stable, labs are stable we could possibly stop antibiotic at 4 weeks without getting advanced imaging Plan: -Pt appears clinically stable, no persistent abd pain -Continue augmetin. Suspect hepatic abscess are biliary in source -Labs today -Follow-up in 2 weeks on Feb 14th(she would be have completed 4 weeks of abx at that point)  Laurice Record, [facility]   04/13/22  6:09 AM   I have personally spent 120 minutes involved in face-to-face and non-face-to-face activities for this patient on the day of the visit. Professional time spent includes the following activities: Preparing to see the patient (review of tests), Obtaining and/or reviewing separately obtained history (admission/discharge  record), Performing a medically appropriate examination and/or evaluation , Ordering medications/tests/procedures, referring and communicating with other health care professionals, Documenting clinical information in the EMR, Independently interpreting results (not separately reported), Communicating results to the patient/family/caregiver, Counseling and educating  the patient/family/caregiver and Care coordination (not separately reported).

## 2022-04-14 LAB — COMPLETE METABOLIC PANEL WITH GFR
AG Ratio: 1.3 (calc) (ref 1.0–2.5)
ALT: 123 U/L — ABNORMAL HIGH (ref 6–29)
AST: 28 U/L (ref 10–35)
Albumin: 3.7 g/dL (ref 3.6–5.1)
Alkaline phosphatase (APISO): 91 U/L (ref 37–153)
BUN/Creatinine Ratio: 13 (calc) (ref 6–22)
BUN: 7 mg/dL (ref 7–25)
CO2: 20 mmol/L (ref 20–32)
Calcium: 8.9 mg/dL (ref 8.6–10.4)
Chloride: 105 mmol/L (ref 98–110)
Creat: 0.54 mg/dL — ABNORMAL LOW (ref 0.60–0.95)
Globulin: 2.8 g/dL (calc) (ref 1.9–3.7)
Glucose, Bld: 211 mg/dL — ABNORMAL HIGH (ref 65–99)
Potassium: 3.6 mmol/L (ref 3.5–5.3)
Sodium: 138 mmol/L (ref 135–146)
Total Bilirubin: 0.8 mg/dL (ref 0.2–1.2)
Total Protein: 6.5 g/dL (ref 6.1–8.1)
eGFR: 91 mL/min/{1.73_m2} (ref >=60)

## 2022-04-14 LAB — CBC WITH DIFFERENTIAL/PLATELET
Absolute Monocytes: 449 cells/uL (ref 200–950)
Basophils Absolute: 41 cells/uL (ref 0–200)
Basophils Relative: 0.8 %
Eosinophils Absolute: 82 cells/uL (ref 15–500)
Eosinophils Relative: 1.6 %
HCT: 24.6 % — ABNORMAL LOW (ref 35.0–45.0)
Hemoglobin: 8.2 g/dL — ABNORMAL LOW (ref 11.7–15.5)
Lymphs Abs: 1132 cells/uL (ref 850–3900)
MCH: 31.7 pg (ref 27.0–33.0)
MCHC: 33.3 g/dL (ref 32.0–36.0)
MCV: 95 fL (ref 80.0–100.0)
MPV: 10.2 fL (ref 7.5–12.5)
Monocytes Relative: 8.8 %
Neutro Abs: 3397 cells/uL (ref 1500–7800)
Neutrophils Relative %: 66.6 %
Platelets: 326 10*3/uL (ref 140–400)
RBC: 2.59 10*6/uL — ABNORMAL LOW (ref 3.80–5.10)
RDW: 15.9 % — ABNORMAL HIGH (ref 11.0–15.0)
Total Lymphocyte: 22.2 %
WBC: 5.1 10*3/uL (ref 3.8–10.8)

## 2022-04-14 LAB — C-REACTIVE PROTEIN: CRP: 11.4 mg/L — ABNORMAL HIGH (ref <8.0)

## 2022-04-14 LAB — SEDIMENTATION RATE: Sed Rate: 43 mm/h — ABNORMAL HIGH (ref 0–30)

## 2022-04-15 DIAGNOSIS — Z7984 Long term (current) use of oral hypoglycemic drugs: Secondary | ICD-10-CM | POA: Diagnosis not present

## 2022-04-15 DIAGNOSIS — K219 Gastro-esophageal reflux disease without esophagitis: Secondary | ICD-10-CM | POA: Diagnosis not present

## 2022-04-15 DIAGNOSIS — E039 Hypothyroidism, unspecified: Secondary | ICD-10-CM | POA: Diagnosis not present

## 2022-04-15 DIAGNOSIS — K5790 Diverticulosis of intestine, part unspecified, without perforation or abscess without bleeding: Secondary | ICD-10-CM | POA: Diagnosis not present

## 2022-04-15 DIAGNOSIS — Z9181 History of falling: Secondary | ICD-10-CM | POA: Diagnosis not present

## 2022-04-15 DIAGNOSIS — M810 Age-related osteoporosis without current pathological fracture: Secondary | ICD-10-CM | POA: Diagnosis not present

## 2022-04-15 DIAGNOSIS — E559 Vitamin D deficiency, unspecified: Secondary | ICD-10-CM | POA: Diagnosis not present

## 2022-04-15 DIAGNOSIS — N182 Chronic kidney disease, stage 2 (mild): Secondary | ICD-10-CM | POA: Diagnosis not present

## 2022-04-15 DIAGNOSIS — N390 Urinary tract infection, site not specified: Secondary | ICD-10-CM | POA: Diagnosis not present

## 2022-04-15 DIAGNOSIS — D509 Iron deficiency anemia, unspecified: Secondary | ICD-10-CM | POA: Diagnosis not present

## 2022-04-15 DIAGNOSIS — E1122 Type 2 diabetes mellitus with diabetic chronic kidney disease: Secondary | ICD-10-CM | POA: Diagnosis not present

## 2022-04-15 DIAGNOSIS — K589 Irritable bowel syndrome without diarrhea: Secondary | ICD-10-CM | POA: Diagnosis not present

## 2022-04-15 DIAGNOSIS — E785 Hyperlipidemia, unspecified: Secondary | ICD-10-CM | POA: Diagnosis not present

## 2022-04-15 DIAGNOSIS — Z9049 Acquired absence of other specified parts of digestive tract: Secondary | ICD-10-CM | POA: Diagnosis not present

## 2022-04-15 DIAGNOSIS — Z48815 Encounter for surgical aftercare following surgery on the digestive system: Secondary | ICD-10-CM | POA: Diagnosis not present

## 2022-04-15 DIAGNOSIS — D631 Anemia in chronic kidney disease: Secondary | ICD-10-CM | POA: Diagnosis not present

## 2022-04-15 DIAGNOSIS — K449 Diaphragmatic hernia without obstruction or gangrene: Secondary | ICD-10-CM | POA: Diagnosis not present

## 2022-04-15 DIAGNOSIS — K8309 Other cholangitis: Secondary | ICD-10-CM | POA: Diagnosis not present

## 2022-04-15 DIAGNOSIS — I129 Hypertensive chronic kidney disease with stage 1 through stage 4 chronic kidney disease, or unspecified chronic kidney disease: Secondary | ICD-10-CM | POA: Diagnosis not present

## 2022-04-15 DIAGNOSIS — K759 Inflammatory liver disease, unspecified: Secondary | ICD-10-CM | POA: Diagnosis not present

## 2022-04-15 DIAGNOSIS — M1990 Unspecified osteoarthritis, unspecified site: Secondary | ICD-10-CM | POA: Diagnosis not present

## 2022-04-15 DIAGNOSIS — I700 Atherosclerosis of aorta: Secondary | ICD-10-CM | POA: Diagnosis not present

## 2022-04-16 DIAGNOSIS — Z9049 Acquired absence of other specified parts of digestive tract: Secondary | ICD-10-CM | POA: Diagnosis not present

## 2022-04-16 DIAGNOSIS — R2689 Other abnormalities of gait and mobility: Secondary | ICD-10-CM | POA: Diagnosis not present

## 2022-04-16 DIAGNOSIS — Z09 Encounter for follow-up examination after completed treatment for conditions other than malignant neoplasm: Secondary | ICD-10-CM | POA: Diagnosis not present

## 2022-04-16 DIAGNOSIS — K5900 Constipation, unspecified: Secondary | ICD-10-CM | POA: Diagnosis not present

## 2022-04-16 DIAGNOSIS — R519 Headache, unspecified: Secondary | ICD-10-CM | POA: Diagnosis not present

## 2022-04-20 ENCOUNTER — Telehealth: Payer: Self-pay

## 2022-04-20 DIAGNOSIS — I129 Hypertensive chronic kidney disease with stage 1 through stage 4 chronic kidney disease, or unspecified chronic kidney disease: Secondary | ICD-10-CM | POA: Diagnosis not present

## 2022-04-20 DIAGNOSIS — Z9049 Acquired absence of other specified parts of digestive tract: Secondary | ICD-10-CM | POA: Diagnosis not present

## 2022-04-20 DIAGNOSIS — M810 Age-related osteoporosis without current pathological fracture: Secondary | ICD-10-CM | POA: Diagnosis not present

## 2022-04-20 DIAGNOSIS — K5790 Diverticulosis of intestine, part unspecified, without perforation or abscess without bleeding: Secondary | ICD-10-CM | POA: Diagnosis not present

## 2022-04-20 DIAGNOSIS — D509 Iron deficiency anemia, unspecified: Secondary | ICD-10-CM | POA: Diagnosis not present

## 2022-04-20 DIAGNOSIS — N390 Urinary tract infection, site not specified: Secondary | ICD-10-CM | POA: Diagnosis not present

## 2022-04-20 DIAGNOSIS — K759 Inflammatory liver disease, unspecified: Secondary | ICD-10-CM | POA: Diagnosis not present

## 2022-04-20 DIAGNOSIS — Z48815 Encounter for surgical aftercare following surgery on the digestive system: Secondary | ICD-10-CM | POA: Diagnosis not present

## 2022-04-20 DIAGNOSIS — E1122 Type 2 diabetes mellitus with diabetic chronic kidney disease: Secondary | ICD-10-CM | POA: Diagnosis not present

## 2022-04-20 DIAGNOSIS — K219 Gastro-esophageal reflux disease without esophagitis: Secondary | ICD-10-CM | POA: Diagnosis not present

## 2022-04-20 DIAGNOSIS — Z9181 History of falling: Secondary | ICD-10-CM | POA: Diagnosis not present

## 2022-04-20 DIAGNOSIS — Z7984 Long term (current) use of oral hypoglycemic drugs: Secondary | ICD-10-CM | POA: Diagnosis not present

## 2022-04-20 DIAGNOSIS — E039 Hypothyroidism, unspecified: Secondary | ICD-10-CM | POA: Diagnosis not present

## 2022-04-20 DIAGNOSIS — K8309 Other cholangitis: Secondary | ICD-10-CM | POA: Diagnosis not present

## 2022-04-20 DIAGNOSIS — K589 Irritable bowel syndrome without diarrhea: Secondary | ICD-10-CM | POA: Diagnosis not present

## 2022-04-20 DIAGNOSIS — M1990 Unspecified osteoarthritis, unspecified site: Secondary | ICD-10-CM | POA: Diagnosis not present

## 2022-04-20 DIAGNOSIS — D631 Anemia in chronic kidney disease: Secondary | ICD-10-CM | POA: Diagnosis not present

## 2022-04-20 DIAGNOSIS — N182 Chronic kidney disease, stage 2 (mild): Secondary | ICD-10-CM | POA: Diagnosis not present

## 2022-04-20 DIAGNOSIS — K449 Diaphragmatic hernia without obstruction or gangrene: Secondary | ICD-10-CM | POA: Diagnosis not present

## 2022-04-20 DIAGNOSIS — I700 Atherosclerosis of aorta: Secondary | ICD-10-CM | POA: Diagnosis not present

## 2022-04-20 DIAGNOSIS — E559 Vitamin D deficiency, unspecified: Secondary | ICD-10-CM | POA: Diagnosis not present

## 2022-04-20 DIAGNOSIS — E785 Hyperlipidemia, unspecified: Secondary | ICD-10-CM | POA: Diagnosis not present

## 2022-04-20 NOTE — Telephone Encounter (Signed)
Patient daughter called to report that patient is complaining she is freezing but she is under blankets and it is 78 degrees in the home. Asked for oral temp it is 97.1. She also reported pain in left and right abdomen and concerned as she recently has [place] [facility] surgery. Advised to go to ED if pain is severe and contact surgeon to report these concerns and explained that we handle the antibiotic. Denies fever,nausea, vomiting, or diarrhea at this time. Will contact surgeon after her mom wakes from a nap. If symptoms worsen will go to [facility] ED. Reminded of follow up appt regarding antibiotics here on 04/28/22.

## 2022-04-22 DIAGNOSIS — K759 Inflammatory liver disease, unspecified: Secondary | ICD-10-CM | POA: Diagnosis not present

## 2022-04-22 DIAGNOSIS — E559 Vitamin D deficiency, unspecified: Secondary | ICD-10-CM | POA: Diagnosis not present

## 2022-04-22 DIAGNOSIS — M1990 Unspecified osteoarthritis, unspecified site: Secondary | ICD-10-CM | POA: Diagnosis not present

## 2022-04-22 DIAGNOSIS — Z7984 Long term (current) use of oral hypoglycemic drugs: Secondary | ICD-10-CM | POA: Diagnosis not present

## 2022-04-22 DIAGNOSIS — N182 Chronic kidney disease, stage 2 (mild): Secondary | ICD-10-CM | POA: Diagnosis not present

## 2022-04-22 DIAGNOSIS — K589 Irritable bowel syndrome without diarrhea: Secondary | ICD-10-CM | POA: Diagnosis not present

## 2022-04-22 DIAGNOSIS — M810 Age-related osteoporosis without current pathological fracture: Secondary | ICD-10-CM | POA: Diagnosis not present

## 2022-04-22 DIAGNOSIS — E1122 Type 2 diabetes mellitus with diabetic chronic kidney disease: Secondary | ICD-10-CM | POA: Diagnosis not present

## 2022-04-22 DIAGNOSIS — I700 Atherosclerosis of aorta: Secondary | ICD-10-CM | POA: Diagnosis not present

## 2022-04-22 DIAGNOSIS — K5790 Diverticulosis of intestine, part unspecified, without perforation or abscess without bleeding: Secondary | ICD-10-CM | POA: Diagnosis not present

## 2022-04-22 DIAGNOSIS — I129 Hypertensive chronic kidney disease with stage 1 through stage 4 chronic kidney disease, or unspecified chronic kidney disease: Secondary | ICD-10-CM | POA: Diagnosis not present

## 2022-04-22 DIAGNOSIS — E039 Hypothyroidism, unspecified: Secondary | ICD-10-CM | POA: Diagnosis not present

## 2022-04-22 DIAGNOSIS — N390 Urinary tract infection, site not specified: Secondary | ICD-10-CM | POA: Diagnosis not present

## 2022-04-22 DIAGNOSIS — K449 Diaphragmatic hernia without obstruction or gangrene: Secondary | ICD-10-CM | POA: Diagnosis not present

## 2022-04-22 DIAGNOSIS — E785 Hyperlipidemia, unspecified: Secondary | ICD-10-CM | POA: Diagnosis not present

## 2022-04-22 DIAGNOSIS — Z9049 Acquired absence of other specified parts of digestive tract: Secondary | ICD-10-CM | POA: Diagnosis not present

## 2022-04-22 DIAGNOSIS — Z48815 Encounter for surgical aftercare following surgery on the digestive system: Secondary | ICD-10-CM | POA: Diagnosis not present

## 2022-04-22 DIAGNOSIS — D509 Iron deficiency anemia, unspecified: Secondary | ICD-10-CM | POA: Diagnosis not present

## 2022-04-22 DIAGNOSIS — D631 Anemia in chronic kidney disease: Secondary | ICD-10-CM | POA: Diagnosis not present

## 2022-04-22 DIAGNOSIS — K8309 Other cholangitis: Secondary | ICD-10-CM | POA: Diagnosis not present

## 2022-04-22 DIAGNOSIS — Z9181 History of falling: Secondary | ICD-10-CM | POA: Diagnosis not present

## 2022-04-22 DIAGNOSIS — K219 Gastro-esophageal reflux disease without esophagitis: Secondary | ICD-10-CM | POA: Diagnosis not present

## 2022-04-26 DIAGNOSIS — D509 Iron deficiency anemia, unspecified: Secondary | ICD-10-CM | POA: Diagnosis not present

## 2022-04-26 DIAGNOSIS — N182 Chronic kidney disease, stage 2 (mild): Secondary | ICD-10-CM | POA: Diagnosis not present

## 2022-04-26 DIAGNOSIS — K8309 Other cholangitis: Secondary | ICD-10-CM | POA: Diagnosis not present

## 2022-04-26 DIAGNOSIS — Z7984 Long term (current) use of oral hypoglycemic drugs: Secondary | ICD-10-CM | POA: Diagnosis not present

## 2022-04-26 DIAGNOSIS — Z9181 History of falling: Secondary | ICD-10-CM | POA: Diagnosis not present

## 2022-04-26 DIAGNOSIS — I129 Hypertensive chronic kidney disease with stage 1 through stage 4 chronic kidney disease, or unspecified chronic kidney disease: Secondary | ICD-10-CM | POA: Diagnosis not present

## 2022-04-26 DIAGNOSIS — K5790 Diverticulosis of intestine, part unspecified, without perforation or abscess without bleeding: Secondary | ICD-10-CM | POA: Diagnosis not present

## 2022-04-26 DIAGNOSIS — K219 Gastro-esophageal reflux disease without esophagitis: Secondary | ICD-10-CM | POA: Diagnosis not present

## 2022-04-26 DIAGNOSIS — I700 Atherosclerosis of aorta: Secondary | ICD-10-CM | POA: Diagnosis not present

## 2022-04-26 DIAGNOSIS — M810 Age-related osteoporosis without current pathological fracture: Secondary | ICD-10-CM | POA: Diagnosis not present

## 2022-04-26 DIAGNOSIS — K449 Diaphragmatic hernia without obstruction or gangrene: Secondary | ICD-10-CM | POA: Diagnosis not present

## 2022-04-26 DIAGNOSIS — N390 Urinary tract infection, site not specified: Secondary | ICD-10-CM | POA: Diagnosis not present

## 2022-04-26 DIAGNOSIS — D631 Anemia in chronic kidney disease: Secondary | ICD-10-CM | POA: Diagnosis not present

## 2022-04-26 DIAGNOSIS — R339 Retention of urine, unspecified: Secondary | ICD-10-CM | POA: Diagnosis not present

## 2022-04-26 DIAGNOSIS — Z48815 Encounter for surgical aftercare following surgery on the digestive system: Secondary | ICD-10-CM | POA: Diagnosis not present

## 2022-04-26 DIAGNOSIS — M1990 Unspecified osteoarthritis, unspecified site: Secondary | ICD-10-CM | POA: Diagnosis not present

## 2022-04-26 DIAGNOSIS — E785 Hyperlipidemia, unspecified: Secondary | ICD-10-CM | POA: Diagnosis not present

## 2022-04-26 DIAGNOSIS — E039 Hypothyroidism, unspecified: Secondary | ICD-10-CM | POA: Diagnosis not present

## 2022-04-26 DIAGNOSIS — K759 Inflammatory liver disease, unspecified: Secondary | ICD-10-CM | POA: Diagnosis not present

## 2022-04-26 DIAGNOSIS — E1122 Type 2 diabetes mellitus with diabetic chronic kidney disease: Secondary | ICD-10-CM | POA: Diagnosis not present

## 2022-04-26 DIAGNOSIS — K589 Irritable bowel syndrome without diarrhea: Secondary | ICD-10-CM | POA: Diagnosis not present

## 2022-04-26 DIAGNOSIS — E559 Vitamin D deficiency, unspecified: Secondary | ICD-10-CM | POA: Diagnosis not present

## 2022-04-26 DIAGNOSIS — Z9049 Acquired absence of other specified parts of digestive tract: Secondary | ICD-10-CM | POA: Diagnosis not present

## 2022-04-27 DIAGNOSIS — N182 Chronic kidney disease, stage 2 (mild): Secondary | ICD-10-CM | POA: Diagnosis not present

## 2022-04-27 DIAGNOSIS — Z9049 Acquired absence of other specified parts of digestive tract: Secondary | ICD-10-CM | POA: Diagnosis not present

## 2022-04-27 DIAGNOSIS — K5790 Diverticulosis of intestine, part unspecified, without perforation or abscess without bleeding: Secondary | ICD-10-CM | POA: Diagnosis not present

## 2022-04-27 DIAGNOSIS — E785 Hyperlipidemia, unspecified: Secondary | ICD-10-CM | POA: Diagnosis not present

## 2022-04-27 DIAGNOSIS — E559 Vitamin D deficiency, unspecified: Secondary | ICD-10-CM | POA: Diagnosis not present

## 2022-04-27 DIAGNOSIS — K589 Irritable bowel syndrome without diarrhea: Secondary | ICD-10-CM | POA: Diagnosis not present

## 2022-04-27 DIAGNOSIS — I129 Hypertensive chronic kidney disease with stage 1 through stage 4 chronic kidney disease, or unspecified chronic kidney disease: Secondary | ICD-10-CM | POA: Diagnosis not present

## 2022-04-27 DIAGNOSIS — D509 Iron deficiency anemia, unspecified: Secondary | ICD-10-CM | POA: Diagnosis not present

## 2022-04-27 DIAGNOSIS — Z9181 History of falling: Secondary | ICD-10-CM | POA: Diagnosis not present

## 2022-04-27 DIAGNOSIS — Z48815 Encounter for surgical aftercare following surgery on the digestive system: Secondary | ICD-10-CM | POA: Diagnosis not present

## 2022-04-27 DIAGNOSIS — M810 Age-related osteoporosis without current pathological fracture: Secondary | ICD-10-CM | POA: Diagnosis not present

## 2022-04-27 DIAGNOSIS — Z7984 Long term (current) use of oral hypoglycemic drugs: Secondary | ICD-10-CM | POA: Diagnosis not present

## 2022-04-27 DIAGNOSIS — E1122 Type 2 diabetes mellitus with diabetic chronic kidney disease: Secondary | ICD-10-CM | POA: Diagnosis not present

## 2022-04-27 DIAGNOSIS — K759 Inflammatory liver disease, unspecified: Secondary | ICD-10-CM | POA: Diagnosis not present

## 2022-04-27 DIAGNOSIS — K219 Gastro-esophageal reflux disease without esophagitis: Secondary | ICD-10-CM | POA: Diagnosis not present

## 2022-04-27 DIAGNOSIS — N390 Urinary tract infection, site not specified: Secondary | ICD-10-CM | POA: Diagnosis not present

## 2022-04-27 DIAGNOSIS — D631 Anemia in chronic kidney disease: Secondary | ICD-10-CM | POA: Diagnosis not present

## 2022-04-27 DIAGNOSIS — K8309 Other cholangitis: Secondary | ICD-10-CM | POA: Diagnosis not present

## 2022-04-27 DIAGNOSIS — M1990 Unspecified osteoarthritis, unspecified site: Secondary | ICD-10-CM | POA: Diagnosis not present

## 2022-04-27 DIAGNOSIS — K449 Diaphragmatic hernia without obstruction or gangrene: Secondary | ICD-10-CM | POA: Diagnosis not present

## 2022-04-27 DIAGNOSIS — E039 Hypothyroidism, unspecified: Secondary | ICD-10-CM | POA: Diagnosis not present

## 2022-04-27 DIAGNOSIS — I700 Atherosclerosis of aorta: Secondary | ICD-10-CM | POA: Diagnosis not present

## 2022-04-28 ENCOUNTER — Ambulatory Visit (INDEPENDENT_AMBULATORY_CARE_PROVIDER_SITE_OTHER): Payer: Medicare Other | Admitting: Internal Medicine

## 2022-04-28 ENCOUNTER — Encounter: Payer: Self-pay | Admitting: Internal Medicine

## 2022-04-28 ENCOUNTER — Other Ambulatory Visit: Payer: Self-pay

## 2022-04-28 VITALS — BP 164/72 | HR 73 | Temp 98.0°F | Ht 60.0 in | Wt 108.0 lb

## 2022-04-28 DIAGNOSIS — I700 Atherosclerosis of aorta: Secondary | ICD-10-CM | POA: Diagnosis not present

## 2022-04-28 DIAGNOSIS — K8309 Other cholangitis: Secondary | ICD-10-CM | POA: Diagnosis not present

## 2022-04-28 DIAGNOSIS — N390 Urinary tract infection, site not specified: Secondary | ICD-10-CM | POA: Diagnosis not present

## 2022-04-28 DIAGNOSIS — K750 Abscess of liver: Secondary | ICD-10-CM | POA: Diagnosis not present

## 2022-04-28 DIAGNOSIS — M1990 Unspecified osteoarthritis, unspecified site: Secondary | ICD-10-CM | POA: Diagnosis not present

## 2022-04-28 DIAGNOSIS — D509 Iron deficiency anemia, unspecified: Secondary | ICD-10-CM | POA: Diagnosis not present

## 2022-04-28 DIAGNOSIS — K759 Inflammatory liver disease, unspecified: Secondary | ICD-10-CM | POA: Diagnosis not present

## 2022-04-28 DIAGNOSIS — I129 Hypertensive chronic kidney disease with stage 1 through stage 4 chronic kidney disease, or unspecified chronic kidney disease: Secondary | ICD-10-CM | POA: Diagnosis not present

## 2022-04-28 DIAGNOSIS — M810 Age-related osteoporosis without current pathological fracture: Secondary | ICD-10-CM | POA: Diagnosis not present

## 2022-04-28 DIAGNOSIS — Z9181 History of falling: Secondary | ICD-10-CM | POA: Diagnosis not present

## 2022-04-28 DIAGNOSIS — K449 Diaphragmatic hernia without obstruction or gangrene: Secondary | ICD-10-CM | POA: Diagnosis not present

## 2022-04-28 DIAGNOSIS — K589 Irritable bowel syndrome without diarrhea: Secondary | ICD-10-CM | POA: Diagnosis not present

## 2022-04-28 DIAGNOSIS — Z48815 Encounter for surgical aftercare following surgery on the digestive system: Secondary | ICD-10-CM | POA: Diagnosis not present

## 2022-04-28 DIAGNOSIS — E559 Vitamin D deficiency, unspecified: Secondary | ICD-10-CM | POA: Diagnosis not present

## 2022-04-28 DIAGNOSIS — Z7984 Long term (current) use of oral hypoglycemic drugs: Secondary | ICD-10-CM | POA: Diagnosis not present

## 2022-04-28 DIAGNOSIS — K5790 Diverticulosis of intestine, part unspecified, without perforation or abscess without bleeding: Secondary | ICD-10-CM | POA: Diagnosis not present

## 2022-04-28 DIAGNOSIS — E1122 Type 2 diabetes mellitus with diabetic chronic kidney disease: Secondary | ICD-10-CM | POA: Diagnosis not present

## 2022-04-28 DIAGNOSIS — K219 Gastro-esophageal reflux disease without esophagitis: Secondary | ICD-10-CM | POA: Diagnosis not present

## 2022-04-28 DIAGNOSIS — E039 Hypothyroidism, unspecified: Secondary | ICD-10-CM | POA: Diagnosis not present

## 2022-04-28 DIAGNOSIS — E785 Hyperlipidemia, unspecified: Secondary | ICD-10-CM | POA: Diagnosis not present

## 2022-04-28 DIAGNOSIS — Z9049 Acquired absence of other specified parts of digestive tract: Secondary | ICD-10-CM | POA: Diagnosis not present

## 2022-04-28 DIAGNOSIS — D631 Anemia in chronic kidney disease: Secondary | ICD-10-CM | POA: Diagnosis not present

## 2022-04-28 DIAGNOSIS — N182 Chronic kidney disease, stage 2 (mild): Secondary | ICD-10-CM | POA: Diagnosis not present

## 2022-04-28 NOTE — Progress Notes (Unsigned)
Patient Active Problem List   Diagnosis Date Noted   Metabolic acidosis 0000000   Acute cholecystitis 03/31/2022   Transaminitis 03/31/2022   Anemia of chronic disease 03/31/2022   Thrombocytopenia ([place]) 03/31/2022   Septic shock ([place]) 03/31/2022   Urinary tract infection without hematuria 03/31/2022   Shock ([place]) 03/29/2022   Ascending cholangitis 03/29/2022   Hypokalemia 03/28/2022   LFT elevation 03/28/2022   Hepatitis 03/27/2022   Chronic kidney disease, stage 2 (mild) 12/09/2020   Hearing loss 12/09/2020   Iron deficiency anemia 12/09/2020   Memory impairment 12/09/2020   Urinary retention 12/09/2020   Recurrent urinary tract infection 12/09/2020   Vertigo 12/09/2020   OA (osteoarthritis) of knee 02/25/2020   Primary osteoarthritis of left knee 02/25/2020   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   Iron overload 07/13/2016   Knee pain, bilateral 07/06/2016   Upset stomach 05/18/2016   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   Ear ringing 02/23/2016   Fecal incontinence 01/03/2015   Nausea without vomiting [id number]   Periumbilical abdominal pain 01/03/2015   HTN (hypertension) 01/25/2011   Hyperlipidemia 01/25/2011   Type 2 diabetes mellitus ([place]) 01/25/2011   GERD (gastroesophageal reflux disease) 01/25/2011   Osteoporosis 01/25/2011   Hypothyroidism 01/25/2011    Patient's Medications  New Prescriptions   No medications on file  Previous Medications   ACETAMINOPHEN (TYLENOL) 500 MG TABLET    Take 1,000 mg by mouth every 6 (six) hours as needed for mild pain.   GLUCOSAMINE-CHONDROITIN 500-400 MG TABLET    Take 1 tablet by mouth 3 (three) times daily.   GLUCOSE BLOOD (ACCU-CHEK AVIVA PLUS) TEST STRIP    Use to check blood sugar 3 times daily.   LEVOTHYROXINE (SYNTHROID, LEVOTHROID) 75 MCG TABLET    Take 75 mcg by mouth daily.   LOSARTAN (COZAAR) 50 MG TABLET    Take 50 mg by mouth daily.   MECLIZINE (ANTIVERT)  25 MG TABLET    Take 25 mg by mouth every 12 (twelve) hours as needed for dizziness.   MULTIPLE VITAMINS-MINERALS (MULTIVITAMIN WITH MINERALS) TABLET    Take 1 tablet by mouth daily.   OMEPRAZOLE (PRILOSEC) 40 MG CAPSULE    Take 1 capsule (40 mg total) by mouth daily.   PRAVASTATIN (PRAVACHOL) 80 MG TABLET    Take 80 mg by mouth daily.   SITAGLIPTAN-METFORMIN (JANUMET) 50-500 MG PER TABLET    Take 1 tablet by mouth 2 (two) times daily with a meal.  Modified Medications   No medications on file  Discontinued Medications   No medications on file    Subjective: Lauren [name] is a 82 y.o. F .presents for With past medical history of seasonal allergies, aortic arthrosclerosis, hyperlipidemia, stroke osteoarthritis, diabetes type 2, diverticulosis, dyspepsia, gastritis, GERD, hiatal hernia, history of hemolytic anemia, hemorrhoids, hypertension, hypothyroidism, iron overload, microcytic anemia, osteoporosis, UTI, vitamin D deficiency admitted with hepatitis.  She presented to the ED with epigastric pain that started earlier in the afternoon.  She found to have elevated LFTs and T. bili.  CTA did not show aneurysm, did show hepatic steatosis and all grandma toes disease explained. MRI MRCP showed mild gallbladder thickening.  Cholecystic edema, highly suspicion of acute cholecystitis.  Patient underwent ERCP on on 1/15 with finding showing copious dark bile  and some sludge with mucopurulent debris, moderate biliary sphincterotomy performed.  No cultures obtained.  Patient taken to the OR for  lap chole on 1/17..  ID engaged for antibiotic recommendations.  Patient 1000 pip-tazo, noted to have Cipro allergy.  She was transition to Augmentin from lap chole EOT 1/30. Interim MRI abdomen on 1/22 showed 2 lesion 2.2 X1.6X 2.1 cm and 1.1X 0.6 cm in the liver compatible intrahepatic abscesses.  Hospital course was also complicated by interval elevated LFTs.  With AST 1983/ALT 1283.  Hepatitis panel was negative..  GI  was engaged, no further workup at this elevated so downtrending, etiology of elevation unclear. 04/13/22: No missed dodses on antibioics.  tolerating abx, but sometimes files  a twinge on pain that is dull in quality in R quadrant. Lasts only moments, once a day. No fever chills. [place] daughter translates. Notable to provide Hx about cipro allergy.   Today 02/26/23: Presents with her granddaughter. PT reports tolerating antibiotics without any issues. She does report some dull right sided pain ,on and off.      Review of Systems: Review of Systems  All other systems reviewed and are negative.   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   Possible related to pyridium  G6PD studies pending   Hemorrhoids    Hiatal hernia    Hyperlipidemia    Hyperlipidemia    Hypertension    Hypothyroidism    IBS (irritable bowel syndrome)    Iron overload 07/13/2016   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   Osteoporosis    UTI (lower urinary tract infection)    Vitamin D deficiency     Social History   Tobacco Use   Smoking status: Never   Smokeless tobacco: Never  Substance Use Topics   Alcohol use: No   Drug use: No    Family History  Problem Relation Age of Onset   Colon cancer Neg Hx     Allergies  Allergen Reactions   Pyridium [Phenazopyridine Hcl] Other (See Comments)    Hemolytic anemia   Sulfa Antibiotics     Potential allergy: oxidizing drug: methemoglobinemia/hemolysis on pyridium   Ciprofloxacin Hcl     Sx's almost like a heart attack   Esomeprazole Magnesium Nausea Only    Pain and nausea   Macrodantin [Nitrofurantoin Macrocrystal] Other (See Comments)    GI Upset   Pantoprazole Sodium Nausea Only    abd pain and nausea   Tramadol  Nausea Only    Health Maintenance  Topic Date Due   FOOT EXAM  Never done   OPHTHALMOLOGY EXAM  Never done   Diabetic kidney evaluation - Urine ACR  Never done   Zoster Vaccines- Shingrix (1 of 2) Never done   DEXA SCAN  Never done   DTaP/Tdap/Td (2 - Td or Tdap) 12/23/2016   Medicare Annual Wellness (AWV)  05/13/2021   INFLUENZA VACCINE  10/13/2021   COVID-19 Vaccine (2 - 2023-24 season) 11/13/2021   HEMOGLOBIN A1C  09/30/2022   Diabetic kidney evaluation - eGFR measurement  04/14/2023   Pneumonia Vaccine 8+ Years old  Completed   HPV VACCINES  Aged Out    Objective:  There were no vitals filed for this visit. There is no height or weight on file to calculate BMI.  Physical Exam Constitutional:  Appearance: Normal appearance.  HENT:     Head: Normocephalic and atraumatic.     Right Ear: Tympanic membrane normal.     Left Ear: Tympanic membrane normal.     Nose: Nose normal.     Mouth/Throat:     Mouth: Mucous membranes are moist.  Eyes:     Extraocular Movements: Extraocular movements intact.     Conjunctiva/sclera: Conjunctivae normal.     Pupils: Pupils are equal, round, and reactive to light.  Cardiovascular:     Rate and Rhythm: Normal rate and regular rhythm.     Heart sounds: No murmur heard.    No friction rub. No gallop.  Pulmonary:     Effort: Pulmonary effort is normal.     Breath sounds: Normal breath sounds.  Abdominal:     General: Abdomen is flat.     Palpations: Abdomen is soft.  Musculoskeletal:        General: Normal range of motion.  Skin:    General: Skin is warm and dry.  Neurological:     General: No focal deficit present.     Mental Status: She is alert and oriented to person, place, and time.  Psychiatric:        Mood and Affect: Mood normal.     Lab Results Lab Results  Component Value Date   WBC 5.1 04/13/2022   HGB 8.2 (L) 04/13/2022   HCT 24.6 (L) 04/13/2022   MCV 95.0 04/13/2022   PLT 326 04/13/2022    Lab Results   Component Value Date   CREATININE 0.54 (L) 04/13/2022   BUN 7 04/13/2022   NA 138 04/13/2022   K 3.6 04/13/2022   CL 105 04/13/2022   CO2 20 04/13/2022    Lab Results  Component Value Date   ALT 123 (H) 04/13/2022   AST 28 04/13/2022   GGT 415 (H) 03/27/2022   ALKPHOS 87 04/09/2022   BILITOT 0.8 04/13/2022    No results found for: "CHOL", "HDL", "LDLCALC", "LDLDIRECT", "TRIG", "CHOLHDL" No results found for: "LABRPR", "RPRTITER" No results found for: "HIV1RNAQUANT", "HIV1RNAVL", "CD4TABS"   Problem List Items Addressed This Visit   None  Assessment/Plan 82 year old female presents for hospital follow-up of hepatic abscess #Ascending cholangitis status post ERCP followed by lap chole on 0000000 complicated by hepatic abscess - Patient was admitted initially with elevated LFTs AST/ALT/ALP 258/350/100, T. bili 2.8.  MRCP showed mild gallbladder thickening, cholecystic edema , highly suspicion of acute cholecystitis.  Patient underwent ERCP on 1/15 showing mucopurulent debris, moderate biliary sphincterectomy performed.  Some sludge noted with copious dark bile. - Underwent lap chole 1/17.  Infectious diseases engaged patient transition from pip-tazo to Augmentin for 2 weeks. [facility] course was then complicated by elevated liver enzymes on 1/22 AST/ALT was 1983/1280  GI was engaged.  Patient underwent MRI abdomen that showed 2 lesions compatible with peptic hepatic abscess in segment 5 measuring 2.2 X1.6X 2.1 cm and 1.1X 0.6 cm.  Acute hep panel negative. Etiology of elevated LFTs unclear but they trended down by discharge. -1/30: esr 43, crp 11.4,  LFT trending down, all nl except ALT 123 Plan: -Labs today -Continue abx(2/22 would be one month from when abscess was identified, although som hepatic changes noted on MR on 1/14), would like to get repeat imaging prior to stopping antibiotics as hospital course was complicated by a pretty significant elevation in LFTs. -Repeat CT on  3/6 -F/U on 3/13   Laurice Record, MD [facility] for Infectious  Disease [facility] 04/28/2022, 6:04 AM    I have personally spent 35 minutes involved in face-to-face and non-face-to-face activities for this patient on the day of the visit. Professional time spent includes the following activities: Preparing to see the patient (review of tests), Obtaining and/or reviewing separately obtained history (admission/discharge record), Performing a medically appropriate examination and/or evaluation , Ordering medications/tests/procedures, referring and communicating with other health care professionals, Documenting clinical information in the EMR, Independently interpreting results (not separately reported), Communicating results to the patient/family/caregiver, Counseling and educating the patient/family/caregiver and Care coordination (not separately reported).

## 2022-04-29 LAB — CBC WITH DIFFERENTIAL/PLATELET
Absolute Monocytes: 239 cells/uL (ref 200–950)
Basophils Absolute: 51 cells/uL (ref 0–200)
Basophils Relative: 1.1 %
Eosinophils Absolute: 69 cells/uL (ref 15–500)
Eosinophils Relative: 1.5 %
HCT: 28.5 % — ABNORMAL LOW (ref 35.0–45.0)
Hemoglobin: 9.2 g/dL — ABNORMAL LOW (ref 11.7–15.5)
Lymphs Abs: 1854 cells/uL (ref 850–3900)
MCH: 31.2 pg (ref 27.0–33.0)
MCHC: 32.3 g/dL (ref 32.0–36.0)
MCV: 96.6 fL (ref 80.0–100.0)
MPV: 10.6 fL (ref 7.5–12.5)
Monocytes Relative: 5.2 %
Neutro Abs: 2387 cells/uL (ref 1500–7800)
Neutrophils Relative %: 51.9 %
Platelets: 213 10*3/uL (ref 140–400)
RBC: 2.95 10*6/uL — ABNORMAL LOW (ref 3.80–5.10)
RDW: 15.7 % — ABNORMAL HIGH (ref 11.0–15.0)
Total Lymphocyte: 40.3 %
WBC: 4.6 10*3/uL (ref 3.8–10.8)

## 2022-04-29 LAB — COMPLETE METABOLIC PANEL WITH GFR
AG Ratio: 1.4 (calc) (ref 1.0–2.5)
ALT: 20 U/L (ref 6–29)
AST: 27 U/L (ref 10–35)
Albumin: 3.9 g/dL (ref 3.6–5.1)
Alkaline phosphatase (APISO): 71 U/L (ref 37–153)
BUN/Creatinine Ratio: 10 (calc) (ref 6–22)
BUN: 6 mg/dL — ABNORMAL LOW (ref 7–25)
CO2: 26 mmol/L (ref 20–32)
Calcium: 9.1 mg/dL (ref 8.6–10.4)
Chloride: 107 mmol/L (ref 98–110)
Creat: 0.58 mg/dL — ABNORMAL LOW (ref 0.60–0.95)
Globulin: 2.8 g/dL (calc) (ref 1.9–3.7)
Glucose, Bld: 171 mg/dL — ABNORMAL HIGH (ref 65–99)
Potassium: 3.4 mmol/L — ABNORMAL LOW (ref 3.5–5.3)
Sodium: 140 mmol/L (ref 135–146)
Total Bilirubin: 0.5 mg/dL (ref 0.2–1.2)
Total Protein: 6.7 g/dL (ref 6.1–8.1)
eGFR: 90 mL/min/{1.73_m2} (ref >=60)

## 2022-04-29 LAB — C-REACTIVE PROTEIN: CRP: 1.1 mg/L (ref <8.0)

## 2022-04-29 LAB — SEDIMENTATION RATE: Sed Rate: 29 mm/h (ref 0–30)

## 2022-05-05 DIAGNOSIS — K759 Inflammatory liver disease, unspecified: Secondary | ICD-10-CM | POA: Diagnosis not present

## 2022-05-05 DIAGNOSIS — I129 Hypertensive chronic kidney disease with stage 1 through stage 4 chronic kidney disease, or unspecified chronic kidney disease: Secondary | ICD-10-CM | POA: Diagnosis not present

## 2022-05-05 DIAGNOSIS — E785 Hyperlipidemia, unspecified: Secondary | ICD-10-CM | POA: Diagnosis not present

## 2022-05-05 DIAGNOSIS — M1990 Unspecified osteoarthritis, unspecified site: Secondary | ICD-10-CM | POA: Diagnosis not present

## 2022-05-05 DIAGNOSIS — Z9049 Acquired absence of other specified parts of digestive tract: Secondary | ICD-10-CM | POA: Diagnosis not present

## 2022-05-05 DIAGNOSIS — K5790 Diverticulosis of intestine, part unspecified, without perforation or abscess without bleeding: Secondary | ICD-10-CM | POA: Diagnosis not present

## 2022-05-05 DIAGNOSIS — N182 Chronic kidney disease, stage 2 (mild): Secondary | ICD-10-CM | POA: Diagnosis not present

## 2022-05-05 DIAGNOSIS — Z9181 History of falling: Secondary | ICD-10-CM | POA: Diagnosis not present

## 2022-05-05 DIAGNOSIS — E1122 Type 2 diabetes mellitus with diabetic chronic kidney disease: Secondary | ICD-10-CM | POA: Diagnosis not present

## 2022-05-05 DIAGNOSIS — M810 Age-related osteoporosis without current pathological fracture: Secondary | ICD-10-CM | POA: Diagnosis not present

## 2022-05-05 DIAGNOSIS — E039 Hypothyroidism, unspecified: Secondary | ICD-10-CM | POA: Diagnosis not present

## 2022-05-05 DIAGNOSIS — D631 Anemia in chronic kidney disease: Secondary | ICD-10-CM | POA: Diagnosis not present

## 2022-05-05 DIAGNOSIS — K8309 Other cholangitis: Secondary | ICD-10-CM | POA: Diagnosis not present

## 2022-05-05 DIAGNOSIS — K589 Irritable bowel syndrome without diarrhea: Secondary | ICD-10-CM | POA: Diagnosis not present

## 2022-05-05 DIAGNOSIS — K219 Gastro-esophageal reflux disease without esophagitis: Secondary | ICD-10-CM | POA: Diagnosis not present

## 2022-05-05 DIAGNOSIS — D509 Iron deficiency anemia, unspecified: Secondary | ICD-10-CM | POA: Diagnosis not present

## 2022-05-05 DIAGNOSIS — N390 Urinary tract infection, site not specified: Secondary | ICD-10-CM | POA: Diagnosis not present

## 2022-05-05 DIAGNOSIS — E559 Vitamin D deficiency, unspecified: Secondary | ICD-10-CM | POA: Diagnosis not present

## 2022-05-05 DIAGNOSIS — I700 Atherosclerosis of aorta: Secondary | ICD-10-CM | POA: Diagnosis not present

## 2022-05-05 DIAGNOSIS — Z48815 Encounter for surgical aftercare following surgery on the digestive system: Secondary | ICD-10-CM | POA: Diagnosis not present

## 2022-05-05 DIAGNOSIS — K449 Diaphragmatic hernia without obstruction or gangrene: Secondary | ICD-10-CM | POA: Diagnosis not present

## 2022-05-05 DIAGNOSIS — Z7984 Long term (current) use of oral hypoglycemic drugs: Secondary | ICD-10-CM | POA: Diagnosis not present

## 2022-05-12 DIAGNOSIS — M810 Age-related osteoporosis without current pathological fracture: Secondary | ICD-10-CM | POA: Diagnosis not present

## 2022-05-12 DIAGNOSIS — K759 Inflammatory liver disease, unspecified: Secondary | ICD-10-CM | POA: Diagnosis not present

## 2022-05-12 DIAGNOSIS — K219 Gastro-esophageal reflux disease without esophagitis: Secondary | ICD-10-CM | POA: Diagnosis not present

## 2022-05-12 DIAGNOSIS — D509 Iron deficiency anemia, unspecified: Secondary | ICD-10-CM | POA: Diagnosis not present

## 2022-05-12 DIAGNOSIS — E039 Hypothyroidism, unspecified: Secondary | ICD-10-CM | POA: Diagnosis not present

## 2022-05-12 DIAGNOSIS — N390 Urinary tract infection, site not specified: Secondary | ICD-10-CM | POA: Diagnosis not present

## 2022-05-12 DIAGNOSIS — K589 Irritable bowel syndrome without diarrhea: Secondary | ICD-10-CM | POA: Diagnosis not present

## 2022-05-12 DIAGNOSIS — M1990 Unspecified osteoarthritis, unspecified site: Secondary | ICD-10-CM | POA: Diagnosis not present

## 2022-05-12 DIAGNOSIS — K8309 Other cholangitis: Secondary | ICD-10-CM | POA: Diagnosis not present

## 2022-05-12 DIAGNOSIS — D631 Anemia in chronic kidney disease: Secondary | ICD-10-CM | POA: Diagnosis not present

## 2022-05-12 DIAGNOSIS — Z9181 History of falling: Secondary | ICD-10-CM | POA: Diagnosis not present

## 2022-05-12 DIAGNOSIS — Z48815 Encounter for surgical aftercare following surgery on the digestive system: Secondary | ICD-10-CM | POA: Diagnosis not present

## 2022-05-12 DIAGNOSIS — E559 Vitamin D deficiency, unspecified: Secondary | ICD-10-CM | POA: Diagnosis not present

## 2022-05-12 DIAGNOSIS — I700 Atherosclerosis of aorta: Secondary | ICD-10-CM | POA: Diagnosis not present

## 2022-05-12 DIAGNOSIS — E785 Hyperlipidemia, unspecified: Secondary | ICD-10-CM | POA: Diagnosis not present

## 2022-05-12 DIAGNOSIS — Z9049 Acquired absence of other specified parts of digestive tract: Secondary | ICD-10-CM | POA: Diagnosis not present

## 2022-05-12 DIAGNOSIS — K5790 Diverticulosis of intestine, part unspecified, without perforation or abscess without bleeding: Secondary | ICD-10-CM | POA: Diagnosis not present

## 2022-05-12 DIAGNOSIS — Z7984 Long term (current) use of oral hypoglycemic drugs: Secondary | ICD-10-CM | POA: Diagnosis not present

## 2022-05-12 DIAGNOSIS — E1122 Type 2 diabetes mellitus with diabetic chronic kidney disease: Secondary | ICD-10-CM | POA: Diagnosis not present

## 2022-05-12 DIAGNOSIS — I129 Hypertensive chronic kidney disease with stage 1 through stage 4 chronic kidney disease, or unspecified chronic kidney disease: Secondary | ICD-10-CM | POA: Diagnosis not present

## 2022-05-12 DIAGNOSIS — N182 Chronic kidney disease, stage 2 (mild): Secondary | ICD-10-CM | POA: Diagnosis not present

## 2022-05-12 DIAGNOSIS — K449 Diaphragmatic hernia without obstruction or gangrene: Secondary | ICD-10-CM | POA: Diagnosis not present

## 2022-05-17 ENCOUNTER — Ambulatory Visit (HOSPITAL_COMMUNITY)
Admission: RE | Admit: 2022-05-17 | Discharge: 2022-05-17 | Disposition: A | Discharge status: Home / Self Care | Payer: Medicare Other | Source: Ambulatory Visit | Attending: Internal Medicine | Admitting: Internal Medicine

## 2022-05-17 ENCOUNTER — Other Ambulatory Visit: Payer: Self-pay | Admitting: Internal Medicine

## 2022-05-17 DIAGNOSIS — K750 Abscess of liver: Secondary | ICD-10-CM | POA: Diagnosis not present

## 2022-05-17 DIAGNOSIS — K5730 Diverticulosis of large intestine without perforation or abscess without bleeding: Secondary | ICD-10-CM | POA: Diagnosis not present

## 2022-05-17 MED ORDER — IOHEXOL 350 MG/ML SOLN
75.0000 mL | Freq: Once | INTRAVENOUS | Status: AC | PRN
  Administered 2022-05-17: 75 mL via INTRAVENOUS

## 2022-05-17 MED ORDER — IOHEXOL 9 MG/ML PO SOLN
500.0000 mL | ORAL | Status: AC
  Administered 2022-05-17: 500 mL via ORAL

## 2022-05-21 DIAGNOSIS — R339 Retention of urine, unspecified: Secondary | ICD-10-CM | POA: Diagnosis not present

## 2022-05-26 ENCOUNTER — Ambulatory Visit (INDEPENDENT_AMBULATORY_CARE_PROVIDER_SITE_OTHER): Payer: Medicare Other | Admitting: Internal Medicine

## 2022-05-26 ENCOUNTER — Encounter: Payer: Self-pay | Admitting: Internal Medicine

## 2022-05-26 ENCOUNTER — Other Ambulatory Visit: Payer: Self-pay

## 2022-05-26 VITALS — BP 125/75 | HR 84 | Temp 98.2°F | Resp 16 | Wt 102.0 lb

## 2022-05-26 DIAGNOSIS — K750 Abscess of liver: Secondary | ICD-10-CM | POA: Diagnosis not present

## 2022-05-26 DIAGNOSIS — K8309 Other cholangitis: Secondary | ICD-10-CM | POA: Diagnosis not present

## 2022-05-26 DIAGNOSIS — M1990 Unspecified osteoarthritis, unspecified site: Secondary | ICD-10-CM | POA: Diagnosis not present

## 2022-05-26 DIAGNOSIS — N390 Urinary tract infection, site not specified: Secondary | ICD-10-CM | POA: Diagnosis not present

## 2022-05-26 DIAGNOSIS — E559 Vitamin D deficiency, unspecified: Secondary | ICD-10-CM | POA: Diagnosis not present

## 2022-05-26 DIAGNOSIS — K589 Irritable bowel syndrome without diarrhea: Secondary | ICD-10-CM | POA: Diagnosis not present

## 2022-05-26 DIAGNOSIS — K759 Inflammatory liver disease, unspecified: Secondary | ICD-10-CM | POA: Diagnosis not present

## 2022-05-26 DIAGNOSIS — Z7984 Long term (current) use of oral hypoglycemic drugs: Secondary | ICD-10-CM | POA: Diagnosis not present

## 2022-05-26 DIAGNOSIS — K219 Gastro-esophageal reflux disease without esophagitis: Secondary | ICD-10-CM | POA: Diagnosis not present

## 2022-05-26 DIAGNOSIS — K5790 Diverticulosis of intestine, part unspecified, without perforation or abscess without bleeding: Secondary | ICD-10-CM | POA: Diagnosis not present

## 2022-05-26 DIAGNOSIS — E1122 Type 2 diabetes mellitus with diabetic chronic kidney disease: Secondary | ICD-10-CM | POA: Diagnosis not present

## 2022-05-26 DIAGNOSIS — I700 Atherosclerosis of aorta: Secondary | ICD-10-CM | POA: Diagnosis not present

## 2022-05-26 DIAGNOSIS — D509 Iron deficiency anemia, unspecified: Secondary | ICD-10-CM | POA: Diagnosis not present

## 2022-05-26 DIAGNOSIS — N182 Chronic kidney disease, stage 2 (mild): Secondary | ICD-10-CM | POA: Diagnosis not present

## 2022-05-26 DIAGNOSIS — Z48815 Encounter for surgical aftercare following surgery on the digestive system: Secondary | ICD-10-CM | POA: Diagnosis not present

## 2022-05-26 DIAGNOSIS — Z9181 History of falling: Secondary | ICD-10-CM | POA: Diagnosis not present

## 2022-05-26 DIAGNOSIS — D631 Anemia in chronic kidney disease: Secondary | ICD-10-CM | POA: Diagnosis not present

## 2022-05-26 DIAGNOSIS — Z9049 Acquired absence of other specified parts of digestive tract: Secondary | ICD-10-CM | POA: Diagnosis not present

## 2022-05-26 DIAGNOSIS — I129 Hypertensive chronic kidney disease with stage 1 through stage 4 chronic kidney disease, or unspecified chronic kidney disease: Secondary | ICD-10-CM | POA: Diagnosis not present

## 2022-05-26 DIAGNOSIS — E785 Hyperlipidemia, unspecified: Secondary | ICD-10-CM | POA: Diagnosis not present

## 2022-05-26 DIAGNOSIS — E039 Hypothyroidism, unspecified: Secondary | ICD-10-CM | POA: Diagnosis not present

## 2022-05-26 DIAGNOSIS — K449 Diaphragmatic hernia without obstruction or gangrene: Secondary | ICD-10-CM | POA: Diagnosis not present

## 2022-05-26 DIAGNOSIS — M810 Age-related osteoporosis without current pathological fracture: Secondary | ICD-10-CM | POA: Diagnosis not present

## 2022-05-26 NOTE — Progress Notes (Signed)
Patient Active Problem List   Diagnosis Date Noted   Metabolic acidosis 0000000   Acute cholecystitis 03/31/2022   Transaminitis 03/31/2022   Anemia of chronic disease 03/31/2022   Thrombocytopenia ([place]) 03/31/2022   Septic shock ([place]) 03/31/2022   Urinary tract infection without hematuria 03/31/2022   Shock ([place]) 03/29/2022   Ascending cholangitis 03/29/2022   Hypokalemia 03/28/2022   LFT elevation 03/28/2022   Hepatitis 03/27/2022   Chronic kidney disease, stage 2 (mild) 12/09/2020   Hearing loss 12/09/2020   Iron deficiency anemia 12/09/2020   Memory impairment 12/09/2020   Urinary retention 12/09/2020   Recurrent urinary tract infection 12/09/2020   Vertigo 12/09/2020   OA (osteoarthritis) of knee 02/25/2020   Primary osteoarthritis of left knee 02/25/2020   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   Iron overload 07/13/2016   Knee pain, bilateral 07/06/2016   Upset stomach 05/18/2016   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   Ear ringing 02/23/2016   Fecal incontinence 01/03/2015   Nausea without vomiting [id number]   Periumbilical abdominal pain 01/03/2015   HTN (hypertension) 01/25/2011   Hyperlipidemia 01/25/2011   Type 2 diabetes mellitus ([place]) 01/25/2011   GERD (gastroesophageal reflux disease) 01/25/2011   Osteoporosis 01/25/2011   Hypothyroidism 01/25/2011    Patient's Medications  New Prescriptions   No medications on file  Previous Medications   ACETAMINOPHEN (TYLENOL) 500 MG TABLET    Take 1,000 mg by mouth every 6 (six) hours as needed for mild pain.   AMOXICILLIN-CLAVULANATE (AUGMENTIN) 875-125 MG TABLET    Take 1 tablet by mouth 2 (two) times daily.   GLUCOSAMINE-CHONDROITIN 500-400 MG TABLET    Take 1 tablet by mouth 3 (three) times daily.   GLUCOSE BLOOD (ACCU-CHEK AVIVA PLUS) TEST STRIP    Use to check blood sugar 3 times daily.   LEVOTHYROXINE (SYNTHROID, LEVOTHROID) 75 MCG TABLET    Take 75 mcg  by mouth daily.   LOSARTAN (COZAAR) 50 MG TABLET    Take 50 mg by mouth daily.   MECLIZINE (ANTIVERT) 25 MG TABLET    Take 25 mg by mouth every 12 (twelve) hours as needed for dizziness.   MULTIPLE VITAMINS-MINERALS (MULTIVITAMIN WITH MINERALS) TABLET    Take 1 tablet by mouth daily.   OMEPRAZOLE (PRILOSEC) 40 MG CAPSULE    Take 1 capsule (40 mg total) by mouth daily.   PRAVASTATIN (PRAVACHOL) 80 MG TABLET    Take 80 mg by mouth daily.   SITAGLIPTAN-METFORMIN (JANUMET) 50-500 MG PER TABLET    Take 1 tablet by mouth 2 (two) times daily with a meal.  Modified Medications   No medications on file  Discontinued Medications   No medications on file    Subjective: Lauren [name] is a 82 y.o. F .presents for With past medical history of seasonal allergies, aortic arthrosclerosis, hyperlipidemia, stroke osteoarthritis, diabetes type 2, diverticulosis, dyspepsia, gastritis, GERD, hiatal hernia, history of hemolytic anemia, hemorrhoids, hypertension, hypothyroidism, iron overload, microcytic anemia, osteoporosis, UTI, vitamin D deficiency admitted with hepatitis.  She presented to the ED with epigastric pain that started earlier in the afternoon.  She found to have elevated LFTs and T. bili.  CTA did not show aneurysm, did show hepatic steatosis and all grandma toes disease explained. MRI MRCP showed mild gallbladder thickening.  Cholecystic edema, highly suspicion of acute cholecystitis.  Patient underwent ERCP on on 1/15 with finding showing copious dark bile  and some  sludge with mucopurulent debris, moderate biliary sphincterotomy performed.  No cultures obtained.  Patient taken to the OR for lap chole on 1/17..  ID engaged for antibiotic recommendations.  Patient 1000 pip-tazo, noted to have Cipro allergy.  She was transition to Augmentin from lap chole EOT 1/30. Interim MRI abdomen on 1/22 showed 2 lesion 2.2 X1.6X 2.1 cm and 1.1X 0.6 cm in the liver compatible intrahepatic abscesses.  Hospital course was  also complicated by interval elevated LFTs.  With AST 1983/ALT 1283.  Hepatitis panel was negative..  GI was engaged, no further workup at this elevated so downtrending, etiology of elevation unclear. 04/13/22: No missed dodses on antibioics.  tolerating abx, but sometimes files  a twinge on pain that is dull in quality in R quadrant. Lasts only moments, once a day. No fever chills. [place] daughter translates. Notable to provide Hx about cipro allergy.   02/26/23: Presents with her granddaughter. PT reports tolerating antibiotics without any issues. She does report some dull right sided pain ,on and off.   Today 05/26/22: Doing well son presents at visit. Pt reports surgicla incison is slightly itchy.  Review of Systems: Review of Systems  All other systems reviewed and are negative.   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   Possible related to pyridium  G6PD studies pending   Hemorrhoids    Hiatal hernia    Hyperlipidemia    Hyperlipidemia    Hypertension    Hypothyroidism    IBS (irritable bowel syndrome)    Iron overload 07/13/2016   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   Osteoporosis    UTI (lower urinary tract infection)    Vitamin D deficiency     Social History   Tobacco Use   Smoking status: Never   Smokeless tobacco: Never  Substance Use Topics   Alcohol use: No   Drug use: No    Family History  Problem Relation Age of Onset   Colon cancer Neg Hx     Allergies  Allergen Reactions   Phenazopyridine Anaphylaxis   Pyridium [Phenazopyridine Hcl] Other (See Comments)    Hemolytic anemia   Sulfamethoxazole-Trimethoprim Anaphylaxis   Sulfa Antibiotics     Potential allergy: oxidizing drug: methemoglobinemia/hemolysis on pyridium    Ciprofloxacin Hcl     Sx's almost like a heart attack   Esomeprazole Magnesium Nausea Only    Pain and nausea   Ferrous Sulfate Other (See Comments)   Macrodantin [Nitrofurantoin Macrocrystal] Other (See Comments)    GI Upset   Pantoprazole Sodium Nausea Only    abd pain and nausea   Promethazine Diarrhea   Tramadol Nausea Only    Health Maintenance  Topic Date Due   FOOT EXAM  Never done   OPHTHALMOLOGY EXAM  Never done   Diabetic kidney evaluation - Urine ACR  Never done   Zoster Vaccines- Shingrix (1 of 2) Never done   DEXA SCAN  Never done   DTaP/Tdap/Td (2 - Td or Tdap) 12/23/2016   Medicare Annual Wellness (AWV)  05/13/2021   INFLUENZA VACCINE  10/13/2021   COVID-19 Vaccine (2 - 2023-24 season) 11/13/2021   HEMOGLOBIN A1C  09/30/2022   Diabetic kidney  evaluation - eGFR measurement  04/29/2023   Pneumonia Vaccine 58+ Years old  Completed   HPV VACCINES  Aged Out    Objective:  There were no vitals filed for this visit. There is no height or weight on file to calculate BMI.  Physical Exam Constitutional:      Appearance: Normal appearance.  HENT:     Head: Normocephalic and atraumatic.     Right Ear: Tympanic membrane normal.     Left Ear: Tympanic membrane normal.     Nose: Nose normal.     Mouth/Throat:     Mouth: Mucous membranes are moist.  Eyes:     Extraocular Movements: Extraocular movements intact.     Conjunctiva/sclera: Conjunctivae normal.     Pupils: Pupils are equal, round, and reactive to light.  Cardiovascular:     Rate and Rhythm: Normal rate and regular rhythm.     Heart sounds: No murmur heard.    No friction rub. No gallop.  Pulmonary:     Effort: Pulmonary effort is normal.     Breath sounds: Normal breath sounds.  Abdominal:     General: Abdomen is flat.     Palpations: Abdomen is soft.     Comments: Healed surgical incision below navel.   Musculoskeletal:        General: Normal range of motion.  Skin:    General: Skin is warm  and dry.  Neurological:     General: No focal deficit present.     Mental Status: She is alert and oriented to person, place, and time.  Psychiatric:        Mood and Affect: Mood normal.     Lab Results Lab Results  Component Value Date   WBC 4.6 04/28/2022   HGB 9.2 (L) 04/28/2022   HCT 28.5 (L) 04/28/2022   MCV 96.6 04/28/2022   PLT 213 04/28/2022    Lab Results  Component Value Date   CREATININE 0.58 (L) 04/28/2022   BUN 6 (L) 04/28/2022   NA 140 04/28/2022   K 3.4 (L) 04/28/2022   CL 107 04/28/2022   CO2 26 04/28/2022    Lab Results  Component Value Date   ALT 20 04/28/2022   AST 27 04/28/2022   GGT 415 (H) 03/27/2022   ALKPHOS 87 04/09/2022   BILITOT 0.5 04/28/2022    No results found for: "CHOL", "HDL", "LDLCALC", "LDLDIRECT", "TRIG", "CHOLHDL" No results found for: "LABRPR", "RPRTITER" No results found for: "HIV1RNAQUANT", "HIV1RNAVL", "CD4TABS"   Problem List Items Addressed This Visit   None  Assessment/Plan 82 year old female presents for hospital follow-up of hepatic abscess on Augmentin #Ascending cholangitis status post ERCP followed by lap chole on 0000000 complicated by hepatic abscess #Elevated LFTs-trended down - Patient was admitted initially with elevated LFTs AST/ALT/ALP 258/350/100, T. bili 2.8.  MRCP showed mild gallbladder thickening, cholecystic edema , highly suspicion of acute cholecystitis.  Patient underwent ERCP on 1/15 showing mucopurulent debris, moderate biliary sphincterectomy performed.  Some sludge noted with copious dark bile. - Underwent lap chole 1/17.  Infectious diseases engaged patient transition from pip-tazo to Augmentin for 2 weeks. [facility] course was then complicated by elevated liver enzymes on 1/22 AST/ALT was 1983/1280  GI was engaged.  Patient underwent MRI abdomen that showed 2 lesions compatible with peptic hepatic abscess in segment 5 measuring 2.2 X1.6X 2.1 cm and 1.1X 0.6 cm.  Acute hep panel negative. Etiology of  elevated LFTs unclear but they trended down by discharge. -Continued abx(2/22 would  be one month from when abscess was identified, although some hepatic changes noted on MR on 1/14), would like to get repeat imaging prior to stopping antibiotics as hospital course was complicated by a pretty significant elevation in LFTs. -Repeat CT on 3/4 showed interval resolution of small hepatic hypodense lesion/collection decreased in size a larger lesion measuring 8 mm from 16 mm compared to prior CT. -Will stop antibiotics given CT findings. LFTs normalied on 2/14 Plan: -Stop abx -No c/f skin soft tissue infection at healed incision below navel, pruritis likely part of wound healing.   -F/u PRN   Laurice Record, MD [facility] 05/26/2022, 8:27 AM

## 2022-06-03 DIAGNOSIS — E785 Hyperlipidemia, unspecified: Secondary | ICD-10-CM | POA: Diagnosis not present

## 2022-06-03 DIAGNOSIS — E039 Hypothyroidism, unspecified: Secondary | ICD-10-CM | POA: Diagnosis not present

## 2022-06-03 DIAGNOSIS — E1165 Type 2 diabetes mellitus with hyperglycemia: Secondary | ICD-10-CM | POA: Diagnosis not present

## 2022-06-03 DIAGNOSIS — D649 Anemia, unspecified: Secondary | ICD-10-CM | POA: Diagnosis not present

## 2022-06-10 DIAGNOSIS — K759 Inflammatory liver disease, unspecified: Secondary | ICD-10-CM | POA: Diagnosis not present

## 2022-06-10 DIAGNOSIS — K589 Irritable bowel syndrome without diarrhea: Secondary | ICD-10-CM | POA: Diagnosis not present

## 2022-06-10 DIAGNOSIS — Z9181 History of falling: Secondary | ICD-10-CM | POA: Diagnosis not present

## 2022-06-10 DIAGNOSIS — K5790 Diverticulosis of intestine, part unspecified, without perforation or abscess without bleeding: Secondary | ICD-10-CM | POA: Diagnosis not present

## 2022-06-10 DIAGNOSIS — Z0000 Encounter for general adult medical examination without abnormal findings: Secondary | ICD-10-CM | POA: Diagnosis not present

## 2022-06-10 DIAGNOSIS — I129 Hypertensive chronic kidney disease with stage 1 through stage 4 chronic kidney disease, or unspecified chronic kidney disease: Secondary | ICD-10-CM | POA: Diagnosis not present

## 2022-06-10 DIAGNOSIS — D509 Iron deficiency anemia, unspecified: Secondary | ICD-10-CM | POA: Diagnosis not present

## 2022-06-10 DIAGNOSIS — M1990 Unspecified osteoarthritis, unspecified site: Secondary | ICD-10-CM | POA: Diagnosis not present

## 2022-06-10 DIAGNOSIS — K219 Gastro-esophageal reflux disease without esophagitis: Secondary | ICD-10-CM | POA: Diagnosis not present

## 2022-06-10 DIAGNOSIS — N390 Urinary tract infection, site not specified: Secondary | ICD-10-CM | POA: Diagnosis not present

## 2022-06-10 DIAGNOSIS — Z7984 Long term (current) use of oral hypoglycemic drugs: Secondary | ICD-10-CM | POA: Diagnosis not present

## 2022-06-10 DIAGNOSIS — K8309 Other cholangitis: Secondary | ICD-10-CM | POA: Diagnosis not present

## 2022-06-10 DIAGNOSIS — E559 Vitamin D deficiency, unspecified: Secondary | ICD-10-CM | POA: Diagnosis not present

## 2022-06-10 DIAGNOSIS — I10 Essential (primary) hypertension: Secondary | ICD-10-CM | POA: Diagnosis not present

## 2022-06-10 DIAGNOSIS — E785 Hyperlipidemia, unspecified: Secondary | ICD-10-CM | POA: Diagnosis not present

## 2022-06-10 DIAGNOSIS — R413 Other amnesia: Secondary | ICD-10-CM | POA: Diagnosis not present

## 2022-06-10 DIAGNOSIS — E039 Hypothyroidism, unspecified: Secondary | ICD-10-CM | POA: Diagnosis not present

## 2022-06-10 DIAGNOSIS — K449 Diaphragmatic hernia without obstruction or gangrene: Secondary | ICD-10-CM | POA: Diagnosis not present

## 2022-06-10 DIAGNOSIS — Z9049 Acquired absence of other specified parts of digestive tract: Secondary | ICD-10-CM | POA: Diagnosis not present

## 2022-06-10 DIAGNOSIS — I700 Atherosclerosis of aorta: Secondary | ICD-10-CM | POA: Diagnosis not present

## 2022-06-10 DIAGNOSIS — E1165 Type 2 diabetes mellitus with hyperglycemia: Secondary | ICD-10-CM | POA: Diagnosis not present

## 2022-06-10 DIAGNOSIS — M810 Age-related osteoporosis without current pathological fracture: Secondary | ICD-10-CM | POA: Diagnosis not present

## 2022-06-10 DIAGNOSIS — E1122 Type 2 diabetes mellitus with diabetic chronic kidney disease: Secondary | ICD-10-CM | POA: Diagnosis not present

## 2022-06-10 DIAGNOSIS — E7800 Pure hypercholesterolemia, unspecified: Secondary | ICD-10-CM | POA: Diagnosis not present

## 2022-06-10 DIAGNOSIS — D631 Anemia in chronic kidney disease: Secondary | ICD-10-CM | POA: Diagnosis not present

## 2022-06-10 DIAGNOSIS — N182 Chronic kidney disease, stage 2 (mild): Secondary | ICD-10-CM | POA: Diagnosis not present

## 2022-06-10 DIAGNOSIS — Z48815 Encounter for surgical aftercare following surgery on the digestive system: Secondary | ICD-10-CM | POA: Diagnosis not present

## 2022-06-17 DIAGNOSIS — R339 Retention of urine, unspecified: Secondary | ICD-10-CM | POA: Diagnosis not present

## 2022-06-27 ENCOUNTER — Inpatient Hospital Stay (HOSPITAL_COMMUNITY)
Admission: EM | Admit: 2022-06-27 | Discharge: 2022-07-01 | DRG: 445 | Disposition: A | Discharge status: Home / Self Care | Payer: Medicare Other | Attending: Internal Medicine | Admitting: Internal Medicine

## 2022-06-27 ENCOUNTER — Emergency Department (HOSPITAL_COMMUNITY): Payer: Medicare Other

## 2022-06-27 ENCOUNTER — Encounter (HOSPITAL_COMMUNITY): Payer: Self-pay

## 2022-06-27 ENCOUNTER — Other Ambulatory Visit: Payer: Self-pay

## 2022-06-27 DIAGNOSIS — R109 Unspecified abdominal pain: Secondary | ICD-10-CM | POA: Diagnosis not present

## 2022-06-27 DIAGNOSIS — E871 Hypo-osmolality and hyponatremia: Secondary | ICD-10-CM | POA: Diagnosis present

## 2022-06-27 DIAGNOSIS — E1165 Type 2 diabetes mellitus with hyperglycemia: Secondary | ICD-10-CM | POA: Diagnosis present

## 2022-06-27 DIAGNOSIS — K589 Irritable bowel syndrome without diarrhea: Secondary | ICD-10-CM | POA: Diagnosis present

## 2022-06-27 DIAGNOSIS — Z603 Acculturation difficulty: Secondary | ICD-10-CM | POA: Diagnosis present

## 2022-06-27 DIAGNOSIS — T3695XA Adverse effect of unspecified systemic antibiotic, initial encounter: Secondary | ICD-10-CM

## 2022-06-27 DIAGNOSIS — D539 Nutritional anemia, unspecified: Secondary | ICD-10-CM | POA: Diagnosis not present

## 2022-06-27 DIAGNOSIS — E878 Other disorders of electrolyte and fluid balance, not elsewhere classified: Secondary | ICD-10-CM | POA: Diagnosis not present

## 2022-06-27 DIAGNOSIS — I10 Essential (primary) hypertension: Secondary | ICD-10-CM | POA: Diagnosis not present

## 2022-06-27 DIAGNOSIS — R6520 Severe sepsis without septic shock: Secondary | ICD-10-CM | POA: Diagnosis not present

## 2022-06-27 DIAGNOSIS — Z7984 Long term (current) use of oral hypoglycemic drugs: Secondary | ICD-10-CM | POA: Diagnosis not present

## 2022-06-27 DIAGNOSIS — A419 Sepsis, unspecified organism: Secondary | ICD-10-CM

## 2022-06-27 DIAGNOSIS — E039 Hypothyroidism, unspecified: Secondary | ICD-10-CM | POA: Diagnosis present

## 2022-06-27 DIAGNOSIS — Z96652 Presence of left artificial knee joint: Secondary | ICD-10-CM | POA: Diagnosis present

## 2022-06-27 DIAGNOSIS — K831 Obstruction of bile duct: Secondary | ICD-10-CM | POA: Diagnosis not present

## 2022-06-27 DIAGNOSIS — E785 Hyperlipidemia, unspecified: Secondary | ICD-10-CM | POA: Diagnosis not present

## 2022-06-27 DIAGNOSIS — E119 Type 2 diabetes mellitus without complications: Secondary | ICD-10-CM | POA: Diagnosis not present

## 2022-06-27 DIAGNOSIS — Z885 Allergy status to narcotic agent status: Secondary | ICD-10-CM

## 2022-06-27 DIAGNOSIS — D638 Anemia in other chronic diseases classified elsewhere: Secondary | ICD-10-CM | POA: Diagnosis not present

## 2022-06-27 DIAGNOSIS — R0902 Hypoxemia: Secondary | ICD-10-CM | POA: Diagnosis not present

## 2022-06-27 DIAGNOSIS — R079 Chest pain, unspecified: Secondary | ICD-10-CM | POA: Diagnosis not present

## 2022-06-27 DIAGNOSIS — R935 Abnormal findings on diagnostic imaging of other abdominal regions, including retroperitoneum: Secondary | ICD-10-CM | POA: Diagnosis not present

## 2022-06-27 DIAGNOSIS — Z880 Allergy status to penicillin: Secondary | ICD-10-CM

## 2022-06-27 DIAGNOSIS — E781 Pure hyperglyceridemia: Secondary | ICD-10-CM | POA: Diagnosis not present

## 2022-06-27 DIAGNOSIS — Z79890 Hormone replacement therapy (postmenopausal): Secondary | ICD-10-CM

## 2022-06-27 DIAGNOSIS — E8809 Other disorders of plasma-protein metabolism, not elsewhere classified: Secondary | ICD-10-CM | POA: Diagnosis not present

## 2022-06-27 DIAGNOSIS — K5730 Diverticulosis of large intestine without perforation or abscess without bleeding: Secondary | ICD-10-CM | POA: Diagnosis not present

## 2022-06-27 DIAGNOSIS — E1122 Type 2 diabetes mellitus with diabetic chronic kidney disease: Secondary | ICD-10-CM | POA: Diagnosis not present

## 2022-06-27 DIAGNOSIS — Z1152 Encounter for screening for COVID-19: Secondary | ICD-10-CM

## 2022-06-27 DIAGNOSIS — I129 Hypertensive chronic kidney disease with stage 1 through stage 4 chronic kidney disease, or unspecified chronic kidney disease: Secondary | ICD-10-CM | POA: Diagnosis present

## 2022-06-27 DIAGNOSIS — N319 Neuromuscular dysfunction of bladder, unspecified: Secondary | ICD-10-CM | POA: Diagnosis not present

## 2022-06-27 DIAGNOSIS — Z981 Arthrodesis status: Secondary | ICD-10-CM | POA: Diagnosis not present

## 2022-06-27 DIAGNOSIS — K838 Other specified diseases of biliary tract: Secondary | ICD-10-CM | POA: Diagnosis not present

## 2022-06-27 DIAGNOSIS — K839 Disease of biliary tract, unspecified: Secondary | ICD-10-CM | POA: Diagnosis not present

## 2022-06-27 DIAGNOSIS — I700 Atherosclerosis of aorta: Secondary | ICD-10-CM | POA: Diagnosis present

## 2022-06-27 DIAGNOSIS — K219 Gastro-esophageal reflux disease without esophagitis: Secondary | ICD-10-CM | POA: Diagnosis present

## 2022-06-27 DIAGNOSIS — R1011 Right upper quadrant pain: Secondary | ICD-10-CM | POA: Diagnosis not present

## 2022-06-27 DIAGNOSIS — K750 Abscess of liver: Secondary | ICD-10-CM | POA: Diagnosis not present

## 2022-06-27 DIAGNOSIS — R7401 Elevation of levels of liver transaminase levels: Secondary | ICD-10-CM | POA: Diagnosis not present

## 2022-06-27 DIAGNOSIS — T360X5A Adverse effect of penicillins, initial encounter: Secondary | ICD-10-CM | POA: Diagnosis not present

## 2022-06-27 DIAGNOSIS — D649 Anemia, unspecified: Secondary | ICD-10-CM | POA: Diagnosis not present

## 2022-06-27 DIAGNOSIS — D72819 Decreased white blood cell count, unspecified: Secondary | ICD-10-CM | POA: Diagnosis not present

## 2022-06-27 DIAGNOSIS — R509 Fever, unspecified: Secondary | ICD-10-CM

## 2022-06-27 DIAGNOSIS — Z9049 Acquired absence of other specified parts of digestive tract: Secondary | ICD-10-CM

## 2022-06-27 DIAGNOSIS — Z79899 Other long term (current) drug therapy: Secondary | ICD-10-CM

## 2022-06-27 DIAGNOSIS — N182 Chronic kidney disease, stage 2 (mild): Secondary | ICD-10-CM | POA: Diagnosis present

## 2022-06-27 DIAGNOSIS — R7989 Other specified abnormal findings of blood chemistry: Secondary | ICD-10-CM | POA: Diagnosis not present

## 2022-06-27 DIAGNOSIS — M810 Age-related osteoporosis without current pathological fracture: Secondary | ICD-10-CM | POA: Diagnosis not present

## 2022-06-27 DIAGNOSIS — T7840XA Allergy, unspecified, initial encounter: Secondary | ICD-10-CM

## 2022-06-27 DIAGNOSIS — E876 Hypokalemia: Secondary | ICD-10-CM | POA: Diagnosis not present

## 2022-06-27 DIAGNOSIS — R0602 Shortness of breath: Secondary | ICD-10-CM | POA: Diagnosis not present

## 2022-06-27 DIAGNOSIS — Z882 Allergy status to sulfonamides status: Secondary | ICD-10-CM

## 2022-06-27 DIAGNOSIS — Z888 Allergy status to other drugs, medicaments and biological substances status: Secondary | ICD-10-CM

## 2022-06-27 DIAGNOSIS — H9190 Unspecified hearing loss, unspecified ear: Secondary | ICD-10-CM | POA: Diagnosis present

## 2022-06-27 DIAGNOSIS — Z87440 Personal history of urinary (tract) infections: Secondary | ICD-10-CM

## 2022-06-27 DIAGNOSIS — R0600 Dyspnea, unspecified: Secondary | ICD-10-CM | POA: Diagnosis not present

## 2022-06-27 DIAGNOSIS — D631 Anemia in chronic kidney disease: Secondary | ICD-10-CM | POA: Diagnosis present

## 2022-06-27 DIAGNOSIS — M1990 Unspecified osteoarthritis, unspecified site: Secondary | ICD-10-CM | POA: Diagnosis present

## 2022-06-27 LAB — COMPREHENSIVE METABOLIC PANEL
ALT: 115 U/L — ABNORMAL HIGH (ref 0–44)
AST: 95 U/L — ABNORMAL HIGH (ref 15–41)
Albumin: 2.8 g/dL — ABNORMAL LOW (ref 3.5–5.0)
Alkaline Phosphatase: 396 U/L — ABNORMAL HIGH (ref 38–126)
Anion gap: 9 (ref 5–15)
BUN: 11 mg/dL (ref 8–23)
CO2: 22 mmol/L (ref 22–32)
Calcium: 8.1 mg/dL — ABNORMAL LOW (ref 8.9–10.3)
Chloride: 93 mmol/L — ABNORMAL LOW (ref 98–111)
Creatinine, Ser: 0.61 mg/dL (ref 0.44–1.00)
GFR, Estimated: >60 mL/min (ref >60)
Glucose, Bld: 242 mg/dL — ABNORMAL HIGH (ref 70–99)
Potassium: 3.9 mmol/L (ref 3.5–5.1)
Sodium: 124 mmol/L — ABNORMAL LOW (ref 135–145)
Total Bilirubin: 1.1 mg/dL (ref 0.3–1.2)
Total Protein: 6.8 g/dL (ref 6.5–8.1)

## 2022-06-27 LAB — CBC WITH DIFFERENTIAL/PLATELET
Abs Immature Granulocytes: 0.04 10*3/uL (ref 0.00–0.07)
Basophils Absolute: 0 10*3/uL (ref 0.0–0.1)
Basophils Relative: 0 %
Eosinophils Absolute: 0 10*3/uL (ref 0.0–0.5)
Eosinophils Relative: 0 %
HCT: 28.5 % — ABNORMAL LOW (ref 36.0–46.0)
Hemoglobin: 9.8 g/dL — ABNORMAL LOW (ref 12.0–15.0)
Immature Granulocytes: 0 %
Lymphocytes Relative: 11 %
Lymphs Abs: 1.3 10*3/uL (ref 0.7–4.0)
MCH: 29.9 pg (ref 26.0–34.0)
MCHC: 34.4 g/dL (ref 30.0–36.0)
MCV: 86.9 fL (ref 80.0–100.0)
Monocytes Absolute: 0.4 10*3/uL (ref 0.1–1.0)
Monocytes Relative: 3 %
Neutro Abs: 10 10*3/uL — ABNORMAL HIGH (ref 1.7–7.7)
Neutrophils Relative %: 86 %
Platelets: 234 10*3/uL (ref 150–400)
RBC: 3.28 MIL/uL — ABNORMAL LOW (ref 3.87–5.11)
RDW: 13.3 % (ref 11.5–15.5)
WBC: 11.8 10*3/uL — ABNORMAL HIGH (ref 4.0–10.5)
nRBC: 0 % (ref 0.0–0.2)

## 2022-06-27 LAB — URINALYSIS, ROUTINE W REFLEX MICROSCOPIC
Bacteria, UA: NONE SEEN
Bilirubin Urine: NEGATIVE
Glucose, UA: NEGATIVE mg/dL
Hgb urine dipstick: NEGATIVE
Ketones, ur: NEGATIVE mg/dL
Nitrite: POSITIVE — AB
Protein, ur: NEGATIVE mg/dL
Specific Gravity, Urine: 1.015 (ref 1.005–1.030)
pH: 7 (ref 5.0–8.0)

## 2022-06-27 LAB — CK: Total CK: 9 U/L — ABNORMAL LOW (ref 38–234)

## 2022-06-27 LAB — PROTIME-INR
INR: 1 (ref 0.8–1.2)
Prothrombin Time: 13.6 seconds (ref 11.4–15.2)

## 2022-06-27 LAB — GLUCOSE, CAPILLARY: Glucose-Capillary: 237 mg/dL — ABNORMAL HIGH (ref 70–99)

## 2022-06-27 LAB — PROCALCITONIN: Procalcitonin: 5.58 ng/mL

## 2022-06-27 LAB — APTT: aPTT: 36 seconds (ref 24–36)

## 2022-06-27 LAB — PHOSPHORUS: Phosphorus: 3 mg/dL (ref 2.5–4.6)

## 2022-06-27 LAB — LIPASE, BLOOD: Lipase: 51 U/L (ref 11–51)

## 2022-06-27 LAB — TSH: TSH: 1.035 u[IU]/mL (ref 0.350–4.500)

## 2022-06-27 LAB — MAGNESIUM: Magnesium: 1.5 mg/dL — ABNORMAL LOW (ref 1.7–2.4)

## 2022-06-27 LAB — LACTIC ACID, PLASMA
Lactic Acid, Venous: 2.5 mmol/L (ref 0.5–1.9)
Lactic Acid, Venous: 1.2 mmol/L (ref 0.5–1.9)

## 2022-06-27 LAB — SARS CORONAVIRUS 2 BY RT PCR: SARS Coronavirus 2 by RT PCR: NEGATIVE

## 2022-06-27 MED ORDER — AZTREONAM 2 G IJ SOLR
2.0000 g | Freq: Once | INTRAMUSCULAR | Status: DC
Start: 2022-06-27 — End: 2022-06-27

## 2022-06-27 MED ORDER — METRONIDAZOLE 500 MG/100ML IV SOLN: 500.0000 mg | Freq: Two times a day (BID) | INTRAVENOUS | Status: DC

## 2022-06-27 MED ORDER — LACTATED RINGERS IV BOLUS
1000.0000 mL | Freq: Once | INTRAVENOUS | Status: AC
  Administered 2022-06-27: 1000 mL via INTRAVENOUS

## 2022-06-27 MED ORDER — INSULIN ASPART 100 UNIT/ML IJ SOLN
0.0000 [IU] | INTRAMUSCULAR | Status: DC
  Administered 2022-06-27: 3 [IU] via SUBCUTANEOUS
  Administered 2022-06-28: 5 [IU] via SUBCUTANEOUS
  Administered 2022-06-28: 2 [IU] via SUBCUTANEOUS
  Administered 2022-06-28: 3 [IU] via SUBCUTANEOUS
  Administered 2022-06-28: 1 [IU] via SUBCUTANEOUS
  Administered 2022-06-28 – 2022-06-30 (×3): 2 [IU] via SUBCUTANEOUS
  Administered 2022-06-30 (×2): 1 [IU] via SUBCUTANEOUS
  Administered 2022-07-01 (×2): 2 [IU] via SUBCUTANEOUS
  Filled 2022-06-27: qty 0.09

## 2022-06-27 MED ORDER — ONDANSETRON HCL 4 MG PO TABS: 4.0000 mg | ORAL_TABLET | Freq: Four times a day (QID) | ORAL | Status: DC | PRN

## 2022-06-27 MED ORDER — FENTANYL CITRATE PF 50 MCG/ML IJ SOSY: 12.5000 ug | PREFILLED_SYRINGE | INTRAMUSCULAR | Status: DC | PRN

## 2022-06-27 MED ORDER — IOHEXOL 350 MG/ML SOLN
75.0000 mL | Freq: Once | INTRAVENOUS | Status: AC | PRN
  Administered 2022-06-27: 75 mL via INTRAVENOUS

## 2022-06-27 MED ORDER — PIPERACILLIN-TAZOBACTAM 3.375 G IVPB 30 MIN
3.3750 g | Freq: Once | INTRAVENOUS | Status: AC
  Administered 2022-06-27: 3.375 g via INTRAVENOUS
  Filled 2022-06-27: qty 50

## 2022-06-27 MED ORDER — LACTATED RINGERS IV SOLN
150.0000 mL/h | INTRAVENOUS | Status: AC
  Administered 2022-06-28: 150 mL/h via INTRAVENOUS

## 2022-06-27 MED ORDER — LEVOTHYROXINE SODIUM 50 MCG PO TABS
75.0000 ug | ORAL_TABLET | Freq: Every day | ORAL | Status: DC
  Administered 2022-06-28 – 2022-07-01 (×4): 75 ug via ORAL
  Filled 2022-06-27 (×4): qty 1

## 2022-06-27 MED ORDER — SODIUM CHLORIDE 0.9 % IV SOLN: INTRAVENOUS | Status: AC

## 2022-06-27 MED ORDER — PRAVASTATIN SODIUM 40 MG PO TABS
80.0000 mg | ORAL_TABLET | Freq: Every day | ORAL | Status: DC
  Administered 2022-06-28 – 2022-07-01 (×4): 80 mg via ORAL
  Filled 2022-06-27: qty 2
  Filled 2022-06-27 (×2): qty 4
  Filled 2022-06-27: qty 2

## 2022-06-27 MED ORDER — METHYLPREDNISOLONE SODIUM SUCC 125 MG IJ SOLR
125.0000 mg | Freq: Once | INTRAMUSCULAR | Status: AC
  Administered 2022-06-27: 125 mg via INTRAVENOUS
  Filled 2022-06-27: qty 2

## 2022-06-27 MED ORDER — MAGNESIUM SULFATE IN D5W 1-5 GM/100ML-% IV SOLN
1.0000 g | Freq: Once | INTRAVENOUS | Status: AC
  Administered 2022-06-28: 1 g via INTRAVENOUS
  Filled 2022-06-27: qty 100

## 2022-06-27 MED ORDER — SODIUM CHLORIDE 0.9 % IV SOLN
1.0000 g | Freq: Three times a day (TID) | INTRAVENOUS | Status: DC
  Filled 2022-06-27: qty 5

## 2022-06-27 MED ORDER — IBUPROFEN 800 MG PO TABS
800.0000 mg | ORAL_TABLET | Freq: Once | ORAL | Status: AC
  Administered 2022-06-27: 800 mg via ORAL
  Filled 2022-06-27: qty 1

## 2022-06-27 MED ORDER — METRONIDAZOLE 500 MG/100ML IV SOLN
500.0000 mg | Freq: Two times a day (BID) | INTRAVENOUS | Status: DC
  Administered 2022-06-27 – 2022-06-28 (×2): 500 mg via INTRAVENOUS
  Filled 2022-06-27 (×2): qty 100

## 2022-06-27 MED ORDER — VANCOMYCIN HCL 500 MG/100ML IV SOLN: 500.0000 mg | INTRAVENOUS | Status: DC

## 2022-06-27 MED ORDER — IOHEXOL 300 MG/ML  SOLN: 80.0000 mL | Freq: Once | INTRAMUSCULAR | Status: DC | PRN

## 2022-06-27 MED ORDER — ONDANSETRON HCL 4 MG/2ML IJ SOLN
4.0000 mg | Freq: Once | INTRAMUSCULAR | Status: AC
  Administered 2022-06-27: 4 mg via INTRAVENOUS
  Filled 2022-06-27: qty 2

## 2022-06-27 MED ORDER — VANCOMYCIN HCL IN DEXTROSE 1-5 GM/200ML-% IV SOLN
1000.0000 mg | Freq: Once | INTRAVENOUS | Status: AC
  Administered 2022-06-28: 1000 mg via INTRAVENOUS
  Filled 2022-06-27: qty 200

## 2022-06-27 MED ORDER — ONDANSETRON HCL 4 MG/2ML IJ SOLN: 4.0000 mg | Freq: Four times a day (QID) | INTRAMUSCULAR | Status: DC | PRN

## 2022-06-27 MED ORDER — SODIUM CHLORIDE 0.9 % IV SOLN
2.0000 g | Freq: Three times a day (TID) | INTRAVENOUS | Status: DC
  Administered 2022-06-28 (×2): 2 g via INTRAVENOUS
  Filled 2022-06-27 (×2): qty 10

## 2022-06-27 MED ORDER — DIPHENHYDRAMINE HCL 50 MG/ML IJ SOLN: 25.0000 mg | Freq: Four times a day (QID) | INTRAMUSCULAR | Status: DC | PRN

## 2022-06-27 MED ORDER — SODIUM CHLORIDE 0.9 % IV SOLN
2.0000 g | Freq: Once | INTRAVENOUS | Status: AC
  Administered 2022-06-27: 2 g via INTRAVENOUS
  Filled 2022-06-27: qty 10

## 2022-06-27 MED ORDER — ALBUTEROL SULFATE (2.5 MG/3ML) 0.083% IN NEBU
2.5000 mg | INHALATION_SOLUTION | Freq: Once | RESPIRATORY_TRACT | Status: AC
  Administered 2022-06-27: 2.5 mg via RESPIRATORY_TRACT
  Filled 2022-06-27: qty 3

## 2022-06-27 MED ORDER — EPINEPHRINE 0.3 MG/0.3ML IJ SOAJ
0.3000 mg | Freq: Once | INTRAMUSCULAR | Status: DC
  Filled 2022-06-27: qty 0.3

## 2022-06-27 MED ORDER — DIPHENHYDRAMINE HCL 50 MG/ML IJ SOLN
25.0000 mg | Freq: Once | INTRAMUSCULAR | Status: AC
  Administered 2022-06-27: 25 mg via INTRAVENOUS
  Filled 2022-06-27: qty 1

## 2022-06-27 MED ORDER — ALBUTEROL SULFATE (2.5 MG/3ML) 0.083% IN NEBU: 2.5000 mg | INHALATION_SOLUTION | RESPIRATORY_TRACT | Status: DC | PRN

## 2022-06-27 MED ORDER — FENTANYL CITRATE PF 50 MCG/ML IJ SOSY
50.0000 ug | PREFILLED_SYRINGE | Freq: Once | INTRAMUSCULAR | Status: AC
  Administered 2022-06-27: 50 ug via INTRAVENOUS
  Filled 2022-06-27: qty 1

## 2022-06-27 MED ORDER — SODIUM CHLORIDE 0.9 % IV SOLN: Freq: Once | INTRAVENOUS | Status: AC

## 2022-06-27 NOTE — Assessment & Plan Note (Signed)
Anemia  Chronic stable

## 2022-06-27 NOTE — H&P (Addendum)
Lauren [name] WJX:[id number] DOB: 1940/05/01 DOA: 06/27/2022     PCP: Pearson Grippe, MD   Outpatient Specialists:   GI Dr.  Shearon Stalls) Pyrtle, Carie Caddy, MD   [facility] Dr. Derrell Lolling Patient arrived to ER on 06/27/22 at 1454 Referred by Attending Charlynne Pander, MD   Patient coming from:    home Lives alone    Chief Complaint:   Chief Complaint  Patient presents with   Abdominal Pain    HPI: Lauren [name] is a 82 y.o. female with medical history significant of Dm2, hypertension HLD GERD hypothyroidism CKD stage II anemia recurrent urinary tract infection patient needs to self cath, history of hepatitis     Presented with right upper quadrant pain Severe right upper quadrant pain for the past 3 days lost her appetite some nausea patient have had cholecystectomy 3 months ago and secondary to ascending cholangitis complicated by liver abscess and elevated LFTs has been followed by ID and finished antibiotics last follow-up was in March. During her last admission patient was treated with IV Zosyn and then transition to IV Unasyn and was able to discharge on Augmentin Unfortunately at this time in the emergency department after receiving a dose of Zosyn patient develop what appears to be an allergic reaction shortness of breath hypoxia and wheezing which required treatment to Solu-Medrol she did not require an EpiPen Denies any chest pain but has been having a fever up to 102  Pr is unsure why she needs to self cath but she does have chronic urinary retention   She is now pain free  No cough no CP  Denies significant ETOH intake   Does not smoke   Lab Results  Component Value Date   SARSCOV2NAA NEGATIVE 06/27/2022   SARSCOV2NAA NEGATIVE 03/11/2022   SARSCOV2NAA NEGATIVE 02/21/2020      Regarding pertinent Chronic problems:    Hyperlipidemia -  on statins Pravachol     HTN on cozaar,        DM 2 -  Lab Results  Component Value Date   HGBA1C 6.3 (H) 04/01/2022    on  PO meds only,     Hypothyroidism: on synthroid  CKD stage II -   Estimated Creatinine Clearance: 38.3 mL/min (by C-G formula based on SCr of 0.61 mg/dL).  Lab Results  Component Value Date   CREATININE 0.61 06/27/2022   CREATININE 0.58 (L) 04/28/2022   CREATININE 0.54 (L) 04/13/2022     Chronic anemia - baseline hg Hemoglobin & Hematocrit  Recent Labs    04/13/22 1039 04/28/22 1020 06/27/22 1608  HGB 8.2* 9.2* 9.8*   Iron/TIBC/Ferritin/ %Sat    Component Value Date/Time   IRON 11 (L) 03/29/2022 0325   TIBC 182 (L) 03/29/2022 0325   FERRITIN 466 (H) 03/29/2022 0325   IRONPCTSAT 6 (L) 03/29/2022 0325      While in ER: Clinical Course as of 06/27/22 2047  Sun Jun 27, 2022  1717 WBC(!): 11.8 [AH]  1717 Albumin(!): 2.8 [AH]  1717 AST(!): 95 [AH]  1717 ALT(!): 115 [AH]  1725 ALT(!): 115 [AH]  1725 AST(!): 95 [AH]  1725 Albumin(!): 2.8 [AH]  1725 Glucose(!): 242 [AH]  1725 Sodium(!): 124 Paient appears to be having allergic reaction to Zosyn She has wheezing, 02 sat 83% on RA with good wave form  [AH]  1732 I reviewed the patients CXR at bedside. No pulm edema noted. [AH]  1733 02 sats improving. Patient states taht  she is feeling like it is a bit easier to breathe. [AH]  1746 Patient states that she is is feeling much better. Lungs are CTAB no throat tightness. Still requiring 02 via nasal canula [AH]    Clinical Course User Index [AH] Arthor Captain, PA-C       Lab Orders         Blood culture (routine x 2)         SARS Coronavirus 2 by RT PCR (hospital order, performed in [facility] hospital lab) *cepheid single result test* Anterior Nasal Swab         Respiratory (~20 pathogens) panel by PCR         Comprehensive metabolic panel         Lipase, blood         CBC with Diff         Urinalysis, Routine w reflex microscopic -Urine, Clean Catch         Lactic acid, plasma         Protime-INR         APTT         Acetaminophen level       CXR -  NON  acute  CTabd/pelvis - Increased diffuse dilatation of intrahepatic bile ducts due to 2.1 cm soft tissue mass or lymphadenopathy in the porta hepatis. Stable diffuse bladder wall thickening wall thickening, which may indicate chronic cystitis or neurogenic bladder.   CTA chest -  nonacute, no PE  no evidence of infiltrate  Following Medications were ordered in ER: Medications  EPINEPHrine (EPI-PEN) injection 0.3 mg (has no administration in time range)  fentaNYL (SUBLIMAZE) injection 50 mcg (50 mcg Intravenous Given 06/27/22 1644)  ondansetron (ZOFRAN) injection 4 mg (4 mg Intravenous Given 06/27/22 1644)  0.9 %  sodium chloride infusion (0 mLs Intravenous Stopped 06/27/22 1850)  ibuprofen (ADVIL) tablet 800 mg (800 mg Oral Given 06/27/22 1644)  piperacillin-tazobactam (ZOSYN) IVPB 3.375 g (0 g Intravenous Stopped 06/27/22 1731)  methylPREDNISolone sodium succinate (SOLU-MEDROL) 125 mg/2 mL injection 125 mg (125 mg Intravenous Given 06/27/22 1721)  diphenhydrAMINE (BENADRYL) injection 25 mg (25 mg Intravenous Given 06/27/22 1720)  albuterol (PROVENTIL) (2.5 MG/3ML) 0.083% nebulizer solution 2.5 mg (2.5 mg Nebulization Given 06/27/22 1750)  iohexol (OMNIPAQUE) 350 MG/ML injection 75 mL (75 mLs Intravenous Contrast Given 06/27/22 1804)  lactated ringers bolus 1,000 mL (1,000 mLs Intravenous New Bag/Given 06/27/22 1850)    _______________________________________________________ ER Provider Called:  [facility] Surgery   Dr.Tsue They Recommend admit to medicine   Will see in AM     ER Provider Called: [facility] Dr. Orvan Falconer They Recommend admit to medicine  order MRCP,  Will see in AM      ED Triage Vitals  Enc Vitals Group     BP 06/27/22 1514 (!) 133/53     Pulse Rate 06/27/22 1514 86     Resp 06/27/22 1514 16     Temp 06/27/22 1514 98.4 F (36.9 C)     Temp Source 06/27/22 1514 Oral     SpO2 06/27/22 1514 100 %     Weight 06/27/22 1515 105 lb (47.6 kg)     Height 06/27/22 1515 5' (1.524 m)     Head  Circumference --      Peak Flow --      Pain Score 06/27/22 1515 8     Pain Loc --      Pain Edu? --      Excl. in  GC? --   OZHY(86)@     _________________________________________ Significant initial  Findings: Abnormal Labs Reviewed  COMPREHENSIVE METABOLIC PANEL - Abnormal; Notable for the following components:      Result Value   Sodium 124 (*)    Chloride 93 (*)    Glucose, Bld 242 (*)    Calcium 8.1 (*)    Albumin 2.8 (*)    AST 95 (*)    ALT 115 (*)    Alkaline Phosphatase 396 (*)    All other components within normal limits  CBC WITH DIFFERENTIAL/PLATELET - Abnormal; Notable for the following components:   WBC 11.8 (*)    RBC 3.28 (*)    Hemoglobin 9.8 (*)    HCT 28.5 (*)    Neutro Abs 10.0 (*)    All other components within normal limits  URINALYSIS, ROUTINE W REFLEX MICROSCOPIC - Abnormal; Notable for the following components:   Nitrite POSITIVE (*)    Leukocytes,Ua MODERATE (*)    All other components within normal limits  LACTIC ACID, PLASMA - Abnormal; Notable for the following components:   Lactic Acid, Venous 2.5 (*)    All other components within normal limits       No results for input(s): "CKTOTAL", "CKMB", "TROPONINIHS", "RELINDX" in the last 72 hours.   ECG: Ordered Personally reviewed and interpreted by me showing: HR : 81 Rhythm: Sinus rhythm Right axis deviation Minimal ST elevation, inferior leads QTC 448  BNP (last 3 results) No results for input(s): "BNP" in the last 8760 hours.   COVID-19 Labs  No results for input(s): "DDIMER", "FERRITIN", "LDH", "CRP" in the last 72 hours.  Lab Results  Component Value Date   SARSCOV2NAA NEGATIVE 06/27/2022   SARSCOV2NAA NEGATIVE 03/11/2022   SARSCOV2NAA NEGATIVE 02/21/2020    ____________________ This patient meets SIRS Criteria and may be septic.    The recent clinical data is shown below. Vitals:   06/27/22 1730 06/27/22 1945 06/27/22 2000 06/27/22 2030  BP: (!) 133/57  (!) 143/63 (!)  135/56  Pulse: 89 80 79 79  Resp: 20 19 17 18   Temp:      TempSrc:      SpO2: 92% 100% 97% 97%  Weight:      Height:        WBC     Component Value Date/Time   WBC 11.8 (H) 06/27/2022 1608   LYMPHSABS 1.3 06/27/2022 1608   LYMPHSABS 2.3 09/20/2016 1013   MONOABS 0.4 06/27/2022 1608   EOSABS 0.0 06/27/2022 1608   EOSABS 0.1 09/20/2016 1013   BASOSABS 0.0 06/27/2022 1608   BASOSABS 0.0 09/20/2016 1013    Lactic Acid, Venous    Component Value Date/Time   LATICACIDVEN 1.2 06/27/2022 1936      Procalcitonin   Ordered      UA positive nitrite but no bacteria   Urine analysis:    Component Value Date/Time   COLORURINE YELLOW 06/27/2022 1933   APPEARANCEUR CLEAR 06/27/2022 1933   LABSPEC 1.015 06/27/2022 1933   PHURINE 7.0 06/27/2022 1933   GLUCOSEU NEGATIVE 06/27/2022 1933   HGBUR NEGATIVE 06/27/2022 1933   BILIRUBINUR NEGATIVE 06/27/2022 1933   BILIRUBINUR negative 01/31/2015 1312   BILIRUBINUR neg 12/31/2011 1021   KETONESUR NEGATIVE 06/27/2022 1933   PROTEINUR NEGATIVE 06/27/2022 1933   UROBILINOGEN 0.2 12/23/2018 1345   NITRITE POSITIVE (A) 06/27/2022 1933   LEUKOCYTESUR MODERATE (A) 06/27/2022 1933    Results for orders placed or performed during the hospital encounter of 06/27/22  SARS Coronavirus 2 by RT PCR (hospital order, performed in [facility] hospital lab) *cepheid single result test* Anterior Nasal Swab     Status: None   Collection Time: 06/27/22  4:29 PM   Specimen: Anterior Nasal Swab  Result Value Ref Range Status   SARS Coronavirus 2 by RT PCR NEGATIVE NEGATIVE Final          ABX started Antibiotics Given (last 72 hours)     Date/Time Action Medication Dose Rate   06/27/22 1643 New Bag/Given   piperacillin-tazobactam (ZOSYN) IVPB 3.375 g 3.375 g 100 mL/hr        __________________________________________________________ Recent Labs  Lab 06/27/22 1608  NA 124*  K 3.9  CO2 22  GLUCOSE 242*  BUN 11  CREATININE 0.61  CALCIUM  8.1*    Cr  stable,  Lab Results  Component Value Date   CREATININE 0.61 06/27/2022   CREATININE 0.58 (L) 04/28/2022   CREATININE 0.54 (L) 04/13/2022    Recent Labs  Lab 06/27/22 1608  AST 95*  ALT 115*  ALKPHOS 396*  BILITOT 1.1  PROT 6.8  ALBUMIN 2.8*   Lab Results  Component Value Date   CALCIUM 8.1 (L) 06/27/2022   PHOS 3.8 03/28/2022    Plt: Lab Results  Component Value Date   PLT 234 06/27/2022       Recent Labs  Lab 06/27/22 1608  WBC 11.8*  NEUTROABS 10.0*  HGB 9.8*  HCT 28.5*  MCV 86.9  PLT 234    HG/HCT   stable,      Component Value Date/Time   HGB 9.8 (L) 06/27/2022 1608   HGB 11.9 09/20/2016 1013   HCT 28.5 (L) 06/27/2022 1608   HCT 37.2 09/20/2016 1013   MCV 86.9 06/27/2022 1608   MCV 98 (H) 09/20/2016 1013     Recent Labs  Lab 06/27/22 1608  LIPASE 51      _______________________________________________ Hospitalist was called for admission for biliary obstruction   The following Work up has been ordered so far:  Orders Placed This Encounter  Procedures   Blood culture (routine x 2)   SARS Coronavirus 2 by RT PCR (hospital order, performed in [facility] hospital lab) *cepheid single result test* Anterior Nasal Swab   Respiratory (~20 pathogens) panel by PCR   DG Chest Port 1 View   DG Chest Port 1 View   CT Angio Chest PE W and/or Wo Contrast   CT ABDOMEN PELVIS W CONTRAST   MR ABDOMEN MRCP W WO CONTAST   Comprehensive metabolic panel   Lipase, blood   CBC with Diff   Urinalysis, Routine w reflex microscopic -Urine, Clean Catch   Lactic acid, plasma   Protime-INR   APTT   Acetaminophen level   Diet NPO time specified   Initiate Carrier Fluid Protocol   Cardiac monitoring   Check Rectal Temperature   Document height and weight   Assess and Document Glasgow Coma Scale   Document vital signs within 1-hour of fluid bolus completion.  Notify provider of abnormal vital signs despite fluid resuscitation.   Refer to  Sidebar Report: Sepsis Bundle ED/IP   Notify provider for difficulties obtaining IV access   Consult to general surgery   Consult to gastroenterology   Consult to hospitalist   Pulse oximetry, continuous   EKG 12-Lead   ED EKG 12-Lead   Insert peripheral IV     OTHER Significant initial  Findings:  labs showing:     DM  labs:  HbA1C: Recent Labs    04/01/22 0528  HGBA1C 6.3*       CBG (last 3)  No results for input(s): "GLUCAP" in the last 72 hours.        Cultures:    Component Value Date/Time   SDES  04/04/2022 1027    BLOOD LEFT ANTECUBITAL Performed at [facility], [address] [place], [place] [address]    SDES  04/04/2022 1027    BLOOD RIGHT ANTECUBITAL Performed at [facility], [address] [place], [place] [address]    SPECREQUEST  04/04/2022 1027    BOTTLES DRAWN AEROBIC ONLY Blood Culture adequate volume Performed at [facility], [address] [place], [place] [address]    SPECREQUEST  04/04/2022 1027    BOTTLES DRAWN AEROBIC ONLY Blood Culture adequate volume Performed at [facility], [address], [place], [place] [address]    CULT  04/04/2022 1027    NO GROWTH 5 DAYS Performed at [facility], [address], [place], [place] [address]    CULT  04/04/2022 1027    NO GROWTH 5 DAYS Performed at [facility], [address] [place], [place] [address]    REPTSTATUS 04/09/2022 FINAL 04/04/2022 1027   REPTSTATUS 04/09/2022 FINAL 04/04/2022 1027     Radiological Exams on Admission: CT ABDOMEN PELVIS W CONTRAST  Result Date: 06/27/2022 CLINICAL DATA:  Abdominal pain for 3 days. Leukocytosis. 3 months postop from cholecystectomy. EXAM: CT ABDOMEN AND PELVIS WITH CONTRAST TECHNIQUE: Multidetector CT imaging of the abdomen and pelvis was performed using the standard protocol following bolus administration of intravenous contrast. RADIATION DOSE  REDUCTION: This exam was performed according to the departmental dose-optimization program which includes automated exposure control, adjustment of the mA and/or kV according to patient size and/or use of iterative reconstruction technique. CONTRAST:  75mL OMNIPAQUE IOHEXOL 350 MG/ML SOLN COMPARISON:  05/17/2022 FINDINGS: Lower Chest: No acute findings. Hepatobiliary: No hepatic masses identified. Increased diffuse dilatation of intrahepatic bile ducts is seen. Increased soft tissue mass or lymphadenopathy seen in the porta hepatis measuring 2.1 x 1.8 cm, which appears to obstruct the common bile duct. Pancreas: No mass or inflammatory changes. No evidence of pancreatic ductal dilatation. Spleen: Within normal limits in size and appearance. Adrenals/Urinary Tract: No suspicious masses identified. No evidence of ureteral calculi or hydronephrosis. Stable distended urinary bladder with mild diffuse wall thickening. Stomach/Bowel: No evidence of obstruction, inflammatory process or abnormal fluid collections. Normal appendix visualized. Mild diverticulosis seen involving the ascending colon, without evidence of diverticulitis. Vascular/Lymphatic: Except for enlarged lymph node or mass in the porta hepatis described above, no other sites of lymphadenopathy identified. No acute vascular findings. Aortic atherosclerotic calcification incidentally noted. Reproductive:  No mass or other significant abnormality. Other:  None. Musculoskeletal:  No suspicious bone lesions identified. IMPRESSION: Increased diffuse dilatation of intrahepatic bile ducts due to 2.1 cm soft tissue mass or lymphadenopathy in the porta hepatis. Differential diagnosis includes cholangiocarcinoma and metastatic lymphadenopathy. Consider ERCP for further evaluation. Stable diffuse bladder wall thickening wall thickening, which may indicate chronic cystitis or neurogenic bladder. Right colonic diverticulosis, without radiographic evidence of  diverticulitis. Aortic Atherosclerosis (ICD10-I70.0). Electronically Signed   By: Danae Orleans M.D.   On: 06/27/2022 19:16   CT Angio Chest PE W and/or Wo Contrast  Result Date: 06/27/2022 CLINICAL DATA:  Chest pain in dyspnea for several days. High probability for pulmonary embolism. EXAM: CT ANGIOGRAPHY CHEST WITH  CONTRAST TECHNIQUE: Multidetector CT imaging of the chest was performed using the standard protocol during bolus administration of intravenous contrast. Multiplanar CT image reconstructions and MIPs were obtained to evaluate the vascular anatomy. RADIATION DOSE REDUCTION: This exam was performed according to the departmental dose-optimization program which includes automated exposure control, adjustment of the mA and/or kV according to patient size and/or use of iterative reconstruction technique. CONTRAST:  75mL OMNIPAQUE IOHEXOL 350 MG/ML SOLN COMPARISON:  01/26/2023 FINDINGS: Cardiovascular: Satisfactory opacification of pulmonary arteries noted, and no pulmonary emboli identified. No evidence of thoracic aortic dissection or aneurysm. Aortic and coronary atherosclerotic calcification incidentally noted. Mediastinum/Nodes: No masses or pathologically enlarged lymph nodes identified. Calcified mediastinal lymph nodes are consistent with old granulomatous disease. Lungs/Pleura: No pulmonary mass, infiltrate, or effusion. Upper abdomen: No acute findings. Musculoskeletal: No suspicious bone lesions identified. Review of the MIP images confirms the above findings. IMPRESSION: No evidence of pulmonary embolism or other active disease within the thorax. Aortic Atherosclerosis (ICD10-I70.0). Electronically Signed   By: Danae Orleans M.D.   On: 06/27/2022 19:05   DG Chest Port 1 View  Result Date: 06/27/2022 CLINICAL DATA:  Sudden onset of shortness of breath. EXAM: PORTABLE CHEST 1 VIEW COMPARISON:  Radiograph earlier today, 1 hour ago FINDINGS: The cardiomediastinal contours are normal. Unchanged  blunting of left costophrenic angle. No developing airspace disease, pulmonary edema or pneumothorax. No acute osseous abnormalities are seen. IMPRESSION: Stable radiographic appearance of the chest, no interval change. Electronically Signed   By: Narda Rutherford M.D.   On: 06/27/2022 18:03   DG Chest Port 1 View  Result Date: 06/27/2022 CLINICAL DATA:  RIGHT upper quadrant pain. EXAM: PORTABLE CHEST 1 VIEW COMPARISON:  Chest x-ray dated 04/04/2022 FINDINGS: Lungs are now clear. No pleural effusion or pneumothorax is seen. Heart size and mediastinal contours are within normal limits. Osseous structures about the chest are unremarkable. IMPRESSION: No active disease. Lungs are now clear. Electronically Signed   By: Bary Richard M.D.   On: 06/27/2022 16:52   _______________________________________________________________________________________________________ Latest  Blood pressure (!) 135/56, pulse 79, temperature (!) 101.6 F (38.7 C), temperature source Rectal, resp. rate 18, height 5' (1.524 m), weight 47.6 kg, SpO2 97 %.   Vitals  labs and radiology finding personally reviewed  Review of Systems:    Pertinent positives include:   Fevers, chills, fatigue, abdominal pain, nausea,   loss of appetite, Constitutional: No weight loss, night sweats, weight loss  HEENT:  No headaches, Difficulty swallowing,Tooth/dental problems,Sore throat,  No sneezing, itching, ear ache, nasal congestion, post nasal drip,  Cardio-vascular:  No chest pain, Orthopnea, PND, anasarca, dizziness, palpitations.no Bilateral lower extremity swelling  GI:  No heartburn, indigestion, vomiting, diarrhea, change in bowel habits, melena, blood in stool, hematemesis Resp:  no shortness of breath at rest. No dyspnea on exertion, No excess mucus, no productive cough, No non-productive cough, No coughing up of blood.No change in color of mucus.No wheezing. Skin:  no rash or lesions. No jaundice GU:  no dysuria, change in  color of urine, no urgency or frequency. No straining to urinate.  No flank pain.  Musculoskeletal:  No joint pain or no joint swelling. No decreased range of motion. No back pain.  Psych:  No change in mood or affect. No depression or anxiety. No memory loss.  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   Possible related to pyridium  G6PD studies pending   Hemorrhoids    Hiatal hernia    Hyperlipidemia    Hyperlipidemia    Hypertension    Hypothyroidism    IBS (irritable bowel syndrome)    Iron overload 07/13/2016   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   Osteoporosis    UTI (lower urinary tract infection)    Vitamin D deficiency       Past Surgical History:  Procedure Laterality Date   CHOLECYSTECTOMY N/A 03/31/2022   Procedure: LAPAROSCOPIC CHOLECYSTECTOMY;  Surgeon: Axel Filler, MD;  Location: [facility];  Service: General;  Laterality: N/A;   COLONOSCOPY     ERCP N/A 03/29/2022   Procedure: ENDOSCOPIC RETROGRADE CHOLANGIOPANCREATOGRAPHY (ERCP);  Surgeon: Iva Boop, MD;  Location: [facility];  Service: Gastroenterology;  Laterality: N/A;   PARTIAL KNEE ARTHROPLASTY Left 02/25/2020   Procedure: UNICOMPARTMENTAL KNEE;  Surgeon: Ollen Gross, MD;  Location: [facility];  Service: Orthopedics;  Laterality: Left;    REMOVAL OF STONES  03/29/2022   Procedure: REMOVAL OF STONES;  Surgeon: Iva Boop, MD;  Location: [facility];   Service: Gastroenterology;;   Dennison Mascot  03/29/2022   Procedure: Dennison Mascot;  Surgeon: Iva Boop, MD;  Location: [facility];  Service: Gastroenterology;;    Social History:  Ambulatory   independently     reports that she has never smoked. She has never used smokeless tobacco. She reports that she does not drink alcohol and does not use drugs.     Family History:   Family History  Problem Relation Age of Onset   Colon cancer Neg Hx    ______________________________________________________________________________________________ Allergies: Allergies  Allergen Reactions   Phenazopyridine Anaphylaxis   Pyridium [Phenazopyridine Hcl] Other (See Comments)    Hemolytic anemia   Sulfamethoxazole-Trimethoprim Anaphylaxis   Sulfa Antibiotics     Potential allergy: oxidizing drug: methemoglobinemia/hemolysis on pyridium   Ciprofloxacin Hcl     Sx's almost like a heart attack   Esomeprazole Magnesium Nausea Only    Pain and nausea   Ferrous Sulfate Other (See Comments)   Macrodantin [Nitrofurantoin Macrocrystal] Other (See Comments)    GI Upset   Pantoprazole Sodium Nausea Only    abd pain and nausea   Promethazine Diarrhea   Tramadol Nausea Only     Prior to Admission medications   Medication Sig Start Date End Date Taking? Authorizing Provider  acetaminophen (TYLENOL) 500 MG tablet Take 1,000 mg by mouth every 6 (six) hours as needed for mild pain.    [provider]  amoxicillin-clavulanate (AUGMENTIN) 875-125 MG tablet Take 1 tablet by mouth 2 (two) times daily. Patient not taking: Reported on 05/26/2022    [provider]  glucosamine-chondroitin 500-400 MG tablet Take 1 tablet by mouth 3 (three) times daily.    [provider]  glucose blood (ACCU-CHEK AVIVA PLUS) test strip Use to check blood sugar 3 times daily. 01/20/22     levothyroxine (SYNTHROID, LEVOTHROID) 75 MCG tablet Take 75 mcg by mouth daily.    [provider]  losartan (COZAAR) 50 MG tablet Take 50 mg by  mouth daily.    [provider]  meclizine (ANTIVERT) 25 MG tablet Take 25 mg by mouth every 12 (twelve) hours as needed for dizziness. 11/02/21   [provider]  Multiple Vitamins-Minerals (MULTIVITAMIN WITH MINERALS) tablet Take 1 tablet by mouth daily.    [provider]  omeprazole (PRILOSEC) 40 MG capsule Take 1 capsule (40 mg total) by mouth daily. Patient taking differently: Take 40 mg by mouth daily as needed (indigestion). 03/12/15   Pyrtle, Carie Caddy, MD  pravastatin (PRAVACHOL) 80 MG tablet Take 80 mg by mouth daily. 01/12/20   [provider]  sitaGLIPtan-metformin (JANUMET) 50-500 MG per tablet Take 1 tablet by mouth 2 (two) times daily with a meal.    [provider]    ___________________________________________________________________________________________________ Physical Exam:    06/27/2022    8:30 PM 06/27/2022    8:00 PM 06/27/2022    7:45 PM  Vitals with BMI  Systolic 135 143   Diastolic 56 63   Pulse 79 79 80    1. General:  in No  Acute distress    Chronically ill   -appearing 2. Psychological: Alert and  Oriented 3. Head/ENT:    Dry Mucous Membranes                          Head Non traumatic, neck supple                           Poor Dentition 4. SKIN:   decreased Skin turgor,  Skin clean Dry and intact no rash    5. Heart: Regular rate and rhythm no  Murmur, no Rub or gallop 6. Lungs:  no wheezes or crackles   7. Abdomen: Soft,  non-tender, Non distended bowel sounds present 8. Lower extremities: no clubbing, cyanosis, no  edema 9. Neurologically Grossly intact, moving all 4 extremities equally   10. MSK: Normal range of motion    Chart has been reviewed  ______________________________________________________________________________________________  Assessment/Plan  82 y.o. female with medical history significant of Dm2, hypertension HLD GERD hypothyroidism  CKD stage II anemia recurrent urinary tract infection patient needs to self cath, history of hepatitis    Admitted for biliary obstruction resulting in sepsis   Present on Admission:  Sepsis  Macrocytic anemia  Hyponatremia  HTN (hypertension)  Hyperlipidemia  LFT elevation  Allergic reaction  Hypomagnesemia     Sepsis  -SIRS criteria met with  elevated white blood cell count,       Component Value Date/Time   WBC 11.8 (H) 06/27/2022 1608   LYMPHSABS 1.3 06/27/2022 1608   LYMPHSABS 2.3 09/20/2016 1013    fever   RR >20 Today's Vitals   06/27/22 1850 06/27/22 1945 06/27/22 2000 06/27/22 2030  BP:   (!) 143/63 (!) 135/56  Pulse:  80 79 79  Resp:  19 17 18   Temp:      TempSrc:      SpO2:  100% 97% 97%  Weight:      Height:      PainSc: 0-No pain         Vitals:   06/27/22 1730 06/27/22 1945 06/27/22 2000 06/27/22 2030  BP: (!) 133/57  (!) 143/63 (!) 135/56  Pulse: 89 80 79 79  Resp: 20 19 17 18   Temp:      TempSrc:      SpO2: 92% 100% 97% 97%  Weight:  Height:          -Most likely source being: intra-abdominal,   ] Severe sepsis with  elevated lactic acid >2     Component Value Date/Time   LATICACIDVEN 1.2 06/27/2022 1936       - Obtain serial lactic acid and procalcitonin level.  - Initiated IV antibiotics in ER: Antibiotics Given (last 72 hours)     Date/Time Action Medication Dose Rate   06/27/22 1643 New Bag/Given   piperacillin-tazobactam (ZOSYN) IVPB 3.375 g 3.375 g 100 mL/hr       Will continue  on : aztreonam, flagyl pt had an allergic reaction to Zosyn May benefit from ID consult   - await results of blood and urine culture  - Rehydrate aggressively  Intravenous fluids were administered,     8:55 PM   Type 2 diabetes mellitus (HCC)   - Order Sensitive  SSI    -  check TSH and HgA1C  - Hold by mouth medications     Macrocytic anemia Anemia  Chronic stable  Hyponatremia Obtain urine electrolytes and rehydrate  gently   HTN (hypertension) Allow permissive HTN   Hyperlipidemia Cont Pravachol 80 mg po qday  LFT elevation Elevated Alkphos and transaminases but not  bilirubin Appreciate Gi consult Obtain hepatitis serologies  Allergic reaction Pt had a sever allergic reaction after getting zosyn now appears not in any distress SOB resolved  Will change to Azreonam and metronidazole for tonight Monitor for any recurrent symptoms. Pt never became hypotensive and did not require epi  Hypomagnesemia Will replace and recheck in AM   Other plan as per orders.  DVT prophylaxis:  SCD      Code Status:    Code Status: Prior FULL CODE as per patient  family  I had personally discussed CODE STATUS with patient and family  ACP none  Family Communication:   Family  at  Bedside  plan of care was discussed  with   Son,    Diet  Diet Orders (From admission, onward)     Start     Ordered   06/27/22 1548  Diet NPO time specified  Diet effective now        06/27/22 1547            Disposition Plan:       To home once workup is complete and patient is stable   Following barriers for discharge:                            Electrolytes corrected                               Anemia  stable                             Pain controlled with PO medications                                                          Will need consultants to evaluate patient prior to discharge       Consult Orders  (From admission, onward)  Start     Ordered   06/27/22 2011  Consult to hospitalist  Once       Provider:  (Not yet assigned)  Question Answer Comment  Place call to: [facility]   Reason for Consult Admit      06/27/22 2010                               Consults called:  General surgery, [facility], sent msg to ID Dr. Earlene Plater   Treatment Team:  Hilarie Fredrickson, MD  Admission status:  ED Disposition     ED Disposition  Admit   Condition  --   Comment  Hospital Area:  [facility] [[id number]]  Level of Care: Progressive [102]  Admit to Progressive based on following criteria: MULTISYSTEM THREATS such as stable sepsis, metabolic/electrolyte imbalance with or without encephalopathy that is responding to early treatment.  May admit patient to [facility] or [facility] if equivalent level of care is available:: No  Covid Evaluation: Asymptomatic - no recent exposure (last 10 days) testing not required  Diagnosis: Sepsis [1610960]  Admitting Physician: Therisa Doyne [[id number]]  Attending Physician: Therisa Doyne [[id number]]  Certification:: I certify this patient will need inpatient services for at least 2 midnights  Estimated Length of Stay: 2          inpatient     I Expect 2 midnight stay secondary to severity of patient's current illness need for inpatient interventions justified by the following:     Severe lab/radiological/exam abnormalities including:    Sepsis, biliary obstruction     That are currently affecting medical management.   I expect  patient to be hospitalized for 2 midnights requiring inpatient medical care.  Patient is at high risk for adverse outcome (such as loss of life or disability) if not treated.  Indication for inpatient stay as follows:     inability to maintain oral hydration   Need for operative/procedural  intervention     Need for IV antibiotics, IV fluids,      Level of care          progressive tele indefinitely please discontinue once patient no longer qualifies COVID-19 Labs   Lab Results  Component Value Date   SARSCOV2NAA NEGATIVE 06/27/2022     Precautions: admitted as   Covid Negative    Lamichael Youkhana 06/27/2022, 9:54 PM    [facility]     after 2 AM please page floor coverage PA If 7AM-7PM, please contact the day team taking care of the patient using [email or web address]

## 2022-06-27 NOTE — Progress Notes (Signed)
A consult was received from an ED physician for piperacillin/tazobactam per pharmacy dosing.  The patient's profile has been reviewed for ht/wt/allergies/indication/available labs.    A one time order has been placed for piperacillin/tazobactam 3.375 g IV once to be infused over 30 minutes. Metronidazole discontinued as duplicate therapy.    Further antibiotics/pharmacy consults should be ordered by admitting physician if indicated.                       Thank you, Cindi Carbon, PharmD 06/27/2022  4:21 PM

## 2022-06-27 NOTE — Assessment & Plan Note (Addendum)
Pt had a sever allergic reaction after getting zosyn now appears not in any distress SOB resolved  Will change to Azreonam and metronidazole for tonight Monitor for any recurrent symptoms. Pt never became hypotensive and did not require epi

## 2022-06-27 NOTE — ED Triage Notes (Addendum)
Pt arrived via POV. C/o 8/10 in RUQ, and a "knocking feeling" when they breathe in. Began 3x days ago. Pt had lap chole 3x months ago.  Reports loss of appetite 1x week ago.  AOx4

## 2022-06-27 NOTE — Assessment & Plan Note (Signed)
Allow permissive HTN  

## 2022-06-27 NOTE — ED Provider Notes (Signed)
[place] EMERGENCY DEPARTMENT AT [facility] Provider Note   CSN: [id number] Arrival date & time: 06/27/22  1454     History {Add pertinent medical, surgical, social history, OB history to HPI:1} Chief Complaint  Patient presents with  . Abdominal Pain    Lauren [name] is a 82 y.o. female who presents emergency department with chief complaint of right upper quadrant abdominal pain and loss of appetite.  There is a language barrier.  Patient is [place] speaking.  Her son provides the history and translates.  3 days ago the patient began having pain in her right upper quadrant.  She has had progressively worsening sharp pain worse with breathing in that area and loss of appetite.  She is also been having hot and cold chills.  Patient has been followed by infectious disease and has been completing a course of Augmentin.  She was hospitalized in January after 13-day hospitalization due to acute ascending cholangitis with liver abscess underwent cholecystectomy.    Abdominal Pain      Home Medications Prior to Admission medications   Medication Sig Start Date End Date Taking? Authorizing Provider  acetaminophen (TYLENOL) 500 MG tablet Take 1,000 mg by mouth every 6 (six) hours as needed for mild pain.    [provider]  amoxicillin-clavulanate (AUGMENTIN) 875-125 MG tablet Take 1 tablet by mouth 2 (two) times daily. Patient not taking: Reported on 05/26/2022    [provider]  glucosamine-chondroitin 500-400 MG tablet Take 1 tablet by mouth 3 (three) times daily.    [provider]  glucose blood (ACCU-CHEK AVIVA PLUS) test strip Use to check blood sugar 3 times daily. 01/20/22     levothyroxine (SYNTHROID, LEVOTHROID) 75 MCG tablet Take 75 mcg by mouth daily.    [provider]  losartan (COZAAR) 50 MG tablet Take 50 mg by mouth daily.    [provider]  meclizine (ANTIVERT) 25 MG tablet Take 25 mg by mouth every 12 (twelve) hours  as needed for dizziness. 11/02/21   [provider]  Multiple Vitamins-Minerals (MULTIVITAMIN WITH MINERALS) tablet Take 1 tablet by mouth daily.    [provider]  omeprazole (PRILOSEC) 40 MG capsule Take 1 capsule (40 mg total) by mouth daily. Patient taking differently: Take 40 mg by mouth daily as needed (indigestion). 03/12/15   Pyrtle, Carie Caddy, MD  pravastatin (PRAVACHOL) 80 MG tablet Take 80 mg by mouth daily. 01/12/20   [provider]  sitaGLIPtan-metformin (JANUMET) 50-500 MG per tablet Take 1 tablet by mouth 2 (two) times daily with a meal.    [provider]      Allergies    Phenazopyridine, Pyridium [phenazopyridine hcl], Sulfamethoxazole-trimethoprim, Sulfa antibiotics, Ciprofloxacin hcl, Esomeprazole magnesium, Ferrous sulfate, Macrodantin [nitrofurantoin macrocrystal], Pantoprazole sodium, Promethazine, and Tramadol    Review of Systems   Review of Systems  Gastrointestinal:  Positive for abdominal pain.    Physical Exam Updated Vital Signs BP (!) 133/53 (BP Location: Left Arm)   Pulse 86   Temp 98.4 F (36.9 C) (Oral)   Resp 16   Ht 5' (1.524 m)   Wt 47.6 kg   SpO2 100%   BMI 20.51 kg/m  Physical Exam  ED Results / Procedures / Treatments   Labs (all labs ordered are listed, but only abnormal results are displayed) Labs Reviewed  CBC WITH DIFFERENTIAL/PLATELET - Abnormal; Notable for the following components:      Result Value   WBC 11.8 (*)  RBC 3.28 (*)    Hemoglobin 9.8 (*)    HCT 28.5 (*)    Neutro Abs 10.0 (*)    All other components within normal limits  CULTURE, BLOOD (ROUTINE X 2)  CULTURE, BLOOD (ROUTINE X 2)  SARS CORONAVIRUS 2 BY RT PCR  RESP PANEL BY RT-PCR (RSV, FLU A&B, COVID)  RVPGX2  COMPREHENSIVE METABOLIC PANEL  LIPASE, BLOOD  URINALYSIS, ROUTINE W REFLEX MICROSCOPIC  LACTIC ACID, PLASMA  LACTIC ACID, PLASMA  PROTIME-INR  APTT    EKG None  Radiology No results  found.  Procedures Procedures  {Document cardiac monitor, telemetry assessment procedure when appropriate:1}  Medications Ordered in ED Medications  fentaNYL (SUBLIMAZE) injection 50 mcg (has no administration in time range)  ondansetron (ZOFRAN) injection 4 mg (has no administration in time range)  0.9 %  sodium chloride infusion (has no administration in time range)  metroNIDAZOLE (FLAGYL) IVPB 500 mg (has no administration in time range)  ibuprofen (ADVIL) tablet 800 mg (has no administration in time range)    ED Course/ Medical Decision Making/ A&P   {   Click here for ABCD2, HEART and other calculatorsREFRESH Note before signing :1}                          Medical Decision Making Amount and/or Complexity of Data Reviewed Labs: ordered. Radiology: ordered. ECG/medicine tests: ordered.  Risk Prescription drug management.   ***  {Document critical care time when appropriate:1} {Document review of labs and clinical decision tools ie heart score, Chads2Vasc2 etc:1}  {Document your independent review of radiology images, and any outside records:1} {Document your discussion with family members, caretakers, and with consultants:1} {Document social determinants of health affecting pt's care:1} {Document your decision making why or why not admission, treatments were needed:1} Final Clinical Impression(s) / ED Diagnoses Final diagnoses:  None    Rx / DC Orders ED Discharge Orders     None

## 2022-06-27 NOTE — Progress Notes (Addendum)
Pharmacy Antibiotic Note  Lauren [name] is a 82 y.o. female admitted on 06/27/2022 with sepsis secondary to intra-abdominal source. Pharmacy has been consulted for Aztreonam and Vancomycin dosing. Per MD, there was concern for allergic reaction to Zosyn in the ED.   Plan: Aztreonam 2g IV q8h Vancomycin 1g IV x 1, then 500mg  q24h Vancomycin levels at steady state, as indicated Metronidazole per MD Monitor renal function, cultures, clinical course   Height: 5' (152.4 cm) Weight: 47.6 kg (105 lb) IBW/kg (Calculated) : 45.5  Temp (24hrs), Avg:100 F (37.8 C), Min:98.4 F (36.9 C), Max:101.6 F (38.7 C)  Recent Labs  Lab 06/27/22 1608 06/27/22 1716 06/27/22 1936  WBC 11.8*  --   --   CREATININE 0.61  --   --   LATICACIDVEN  --  2.5* 1.2    Estimated Creatinine Clearance: 38.3 mL/min (by C-G formula based on SCr of 0.61 mg/dL).    Allergies  Allergen Reactions   Phenazopyridine Anaphylaxis   Pyridium [Phenazopyridine Hcl] Other (See Comments)    Hemolytic anemia   Sulfamethoxazole-Trimethoprim Anaphylaxis   Zosyn [Piperacillin Sod-Tazobactam So] Shortness Of Breath   Sulfa Antibiotics     Potential allergy: oxidizing drug: methemoglobinemia/hemolysis on pyridium   Ciprofloxacin Hcl     Sx's almost like a heart attack   Esomeprazole Magnesium Nausea Only    Pain and nausea   Ferrous Sulfate Other (See Comments)   Macrodantin [Nitrofurantoin Macrocrystal] Other (See Comments)    GI Upset   Pantoprazole Sodium Nausea Only    abd pain and nausea   Promethazine Diarrhea   Tramadol Nausea Only    Antimicrobials this admission: 4/14 Zosyn x 1 4/14 Aztreonam >> 4/14 Metronidazole >> 4/14 Vancomycin >>  Dose adjustments this admission: --  Microbiology results: 4/14 BCx:  4/14 Resp panel:   Thank you for allowing pharmacy to be a part of this patient's care.   Greer Pickerel, PharmD, BCPS Clinical Pharmacist 06/27/2022 9:39 PM

## 2022-06-27 NOTE — Subjective & Objective (Addendum)
Severe right upper quadrant pain for the past 3 days lost her appetite some nausea patient have had cholecystectomy 3 months ago and secondary to ascending cholangitis complicated by liver abscess and elevated LFTs has been followed by ID and finished antibiotics last follow-up was in March. During her last admission patient was treated with IV Zosyn and then transition to IV Unasyn and was able to discharge on Augmentin Unfortunately at this time in the emergency department after receiving a dose of Zosyn patient develop what appears to be an allergic reaction shortness of breath hypoxia and wheezing which required treatment to Solu-Medrol she did not require an EpiPen Denies any chest pain but has been having a fever up to 102

## 2022-06-27 NOTE — Assessment & Plan Note (Signed)
Cont Pravachol 80 mg po qday

## 2022-06-27 NOTE — Assessment & Plan Note (Signed)
-   Order Sensitive  SSI     -  check TSH and HgA1C  - Hold by mouth medications*  

## 2022-06-27 NOTE — Assessment & Plan Note (Signed)
Elevated Alkphos and transaminases but not  bilirubin Appreciate Gi consult Obtain hepatitis serologies

## 2022-06-27 NOTE — Assessment & Plan Note (Signed)
-  SIRS criteria met with  elevated white blood cell count,       Component Value Date/Time   WBC 11.8 (H) 06/27/2022 1608   LYMPHSABS 1.3 06/27/2022 1608   LYMPHSABS 2.3 09/20/2016 1013    fever   RR >20 Today's Vitals   06/27/22 1850 06/27/22 1945 06/27/22 2000 06/27/22 2030  BP:   (!) 143/63 (!) 135/56  Pulse:  80 79 79  Resp:  19 17 18   Temp:      TempSrc:      SpO2:  100% 97% 97%  Weight:      Height:      PainSc: 0-No pain         Vitals:   06/27/22 1730 06/27/22 1945 06/27/22 2000 06/27/22 2030  BP: (!) 133/57  (!) 143/63 (!) 135/56  Pulse: 89 80 79 79  Resp: 20 19 17 18   Temp:      TempSrc:      SpO2: 92% 100% 97% 97%  Weight:      Height:          -Most likely source being: intra-abdominal,   ] Severe sepsis with  elevated lactic acid >2     Component Value Date/Time   LATICACIDVEN 1.2 06/27/2022 1936       - Obtain serial lactic acid and procalcitonin level.  - Initiated IV antibiotics in ER: Antibiotics Given (last 72 hours)     Date/Time Action Medication Dose Rate   06/27/22 1643 New Bag/Given   piperacillin-tazobactam (ZOSYN) IVPB 3.375 g 3.375 g 100 mL/hr       Will continue  on : aztreonam, flagyl pt had an allergic reaction to Zosyn May benefit from ID consult   - await results of blood and urine culture  - Rehydrate aggressively  Intravenous fluids were administered,     8:55 PM

## 2022-06-27 NOTE — Assessment & Plan Note (Signed)
Obtain urine electrolytes and rehydrate gently

## 2022-06-27 NOTE — Assessment & Plan Note (Signed)
Will replace and recheck in AM 

## 2022-06-28 ENCOUNTER — Inpatient Hospital Stay (HOSPITAL_COMMUNITY): Payer: Medicare Other

## 2022-06-28 ENCOUNTER — Encounter (HOSPITAL_COMMUNITY): Payer: Self-pay | Admitting: Internal Medicine

## 2022-06-28 DIAGNOSIS — E871 Hypo-osmolality and hyponatremia: Secondary | ICD-10-CM

## 2022-06-28 DIAGNOSIS — K831 Obstruction of bile duct: Principal | ICD-10-CM

## 2022-06-28 DIAGNOSIS — E119 Type 2 diabetes mellitus without complications: Secondary | ICD-10-CM | POA: Diagnosis not present

## 2022-06-28 DIAGNOSIS — E785 Hyperlipidemia, unspecified: Secondary | ICD-10-CM | POA: Diagnosis not present

## 2022-06-28 DIAGNOSIS — K750 Abscess of liver: Secondary | ICD-10-CM | POA: Diagnosis not present

## 2022-06-28 DIAGNOSIS — R509 Fever, unspecified: Secondary | ICD-10-CM

## 2022-06-28 DIAGNOSIS — I10 Essential (primary) hypertension: Secondary | ICD-10-CM | POA: Diagnosis not present

## 2022-06-28 DIAGNOSIS — R7989 Other specified abnormal findings of blood chemistry: Secondary | ICD-10-CM | POA: Diagnosis not present

## 2022-06-28 LAB — CBC
HCT: 29.3 % — ABNORMAL LOW (ref 36.0–46.0)
Hemoglobin: 10.1 g/dL — ABNORMAL LOW (ref 12.0–15.0)
MCH: 30.1 pg (ref 26.0–34.0)
MCHC: 34.5 g/dL (ref 30.0–36.0)
MCV: 87.2 fL (ref 80.0–100.0)
Platelets: 233 10*3/uL (ref 150–400)
RBC: 3.36 MIL/uL — ABNORMAL LOW (ref 3.87–5.11)
RDW: 13.6 % (ref 11.5–15.5)
WBC: 8.8 10*3/uL (ref 4.0–10.5)
nRBC: 0 % (ref 0.0–0.2)

## 2022-06-28 LAB — SEDIMENTATION RATE: Sed Rate: 90 mm/hr — ABNORMAL HIGH (ref 0–22)

## 2022-06-28 LAB — RESPIRATORY PANEL BY PCR

## 2022-06-28 LAB — HEPATITIS PANEL, ACUTE
HCV Ab: NONREACTIVE
Hep A IgM: NONREACTIVE
Hep B C IgM: NONREACTIVE
Hepatitis B Surface Ag: NONREACTIVE

## 2022-06-28 LAB — COMPREHENSIVE METABOLIC PANEL
ALT: 95 U/L — ABNORMAL HIGH (ref 0–44)
AST: 53 U/L — ABNORMAL HIGH (ref 15–41)
Albumin: 2.7 g/dL — ABNORMAL LOW (ref 3.5–5.0)
Alkaline Phosphatase: 367 U/L — ABNORMAL HIGH (ref 38–126)
Anion gap: 11 (ref 5–15)
BUN: 9 mg/dL (ref 8–23)
CO2: 20 mmol/L — ABNORMAL LOW (ref 22–32)
Calcium: 8.3 mg/dL — ABNORMAL LOW (ref 8.9–10.3)
Chloride: 98 mmol/L (ref 98–111)
Creatinine, Ser: 0.53 mg/dL (ref 0.44–1.00)
GFR, Estimated: >60 mL/min (ref >60)
Glucose, Bld: 293 mg/dL — ABNORMAL HIGH (ref 70–99)
Potassium: 3.3 mmol/L — ABNORMAL LOW (ref 3.5–5.1)
Sodium: 129 mmol/L — ABNORMAL LOW (ref 135–145)
Total Bilirubin: 0.6 mg/dL (ref 0.3–1.2)
Total Protein: 6.6 g/dL (ref 6.5–8.1)

## 2022-06-28 LAB — PROTIME-INR
INR: 1.1 (ref 0.8–1.2)
Prothrombin Time: 13.8 seconds (ref 11.4–15.2)

## 2022-06-28 LAB — C-REACTIVE PROTEIN: CRP: 12.9 mg/dL — ABNORMAL HIGH (ref <1.0)

## 2022-06-28 LAB — PREALBUMIN: Prealbumin: 8 mg/dL — ABNORMAL LOW (ref 18–38)

## 2022-06-28 LAB — ACETAMINOPHEN LEVEL: Acetaminophen (Tylenol), Serum: <10 ug/mL — ABNORMAL LOW (ref 10–30)

## 2022-06-28 LAB — PHOSPHORUS: Phosphorus: 3 mg/dL (ref 2.5–4.6)

## 2022-06-28 LAB — GLUCOSE, CAPILLARY
Glucose-Capillary: 289 mg/dL — ABNORMAL HIGH (ref 70–99)
Glucose-Capillary: 215 mg/dL — ABNORMAL HIGH (ref 70–99)
Glucose-Capillary: 148 mg/dL — ABNORMAL HIGH (ref 70–99)
Glucose-Capillary: 183 mg/dL — ABNORMAL HIGH (ref 70–99)
Glucose-Capillary: 176 mg/dL — ABNORMAL HIGH (ref 70–99)

## 2022-06-28 LAB — OSMOLALITY, URINE: Osmolality, Ur: 203 mOsm/kg — ABNORMAL LOW (ref 300–900)

## 2022-06-28 LAB — MAGNESIUM: Magnesium: 2.1 mg/dL (ref 1.7–2.4)

## 2022-06-28 LAB — CREATININE, URINE, RANDOM: Creatinine, Urine: 18 mg/dL

## 2022-06-28 LAB — SODIUM, URINE, RANDOM: Sodium, Ur: 33 mmol/L

## 2022-06-28 LAB — OSMOLALITY: Osmolality: 277 mOsm/kg (ref 275–295)

## 2022-06-28 MED ORDER — SODIUM CHLORIDE 0.9 % IV SOLN
3.0000 g | Freq: Four times a day (QID) | INTRAVENOUS | Status: DC
  Administered 2022-06-28 – 2022-06-30 (×6): 3 g via INTRAVENOUS
  Filled 2022-06-28 (×6): qty 8

## 2022-06-28 MED ORDER — GADOBUTROL 1 MMOL/ML IV SOLN
4.7000 mL | Freq: Once | INTRAVENOUS | Status: AC | PRN
  Administered 2022-06-28: 4.7 mL via INTRAVENOUS

## 2022-06-28 NOTE — Progress Notes (Addendum)
ID Pharmacy Note  Dr. Luciana Axe discussed reaction to Zosyn with the patient. Per discussion with patient and son, Ms.Zietlow experienced some hyperventilation for about a minute or so with no throat swelling or rash. Given previous tolerance of penicillin antibiotics, will re-start Unasyn.    Sharin Mons, PharmD, BCPS, BCIDP Infectious Diseases Clinical Pharmacist Phone: [phone] 06/28/2022 3:42 PM

## 2022-06-28 NOTE — Progress Notes (Signed)
[facility] Progress Note     Subjective: CC-  Since admission abdominal pain has improved. Currently denies any pain, nausea, vomiting. Awaiting MRCP.  WBC 8.8, afebrile. LFTs slightly down with AST 53, ALT 95, Alk phos 367, Tbili 0.6.  Objective: Vital signs in last 24 hours: Temp:  [97.3 F (36.3 C)-101.6 F (38.7 C)] 97.9 F (36.6 C) (04/15 0813) Pulse Rate:  [63-89] 63 (04/15 0813) Resp:  [14-22] 18 (04/15 0813) BP: (127-167)/(53-71) 153/62 (04/15 0813) SpO2:  [92 %-100 %] 100 % (04/15 0813) Weight:  [47.6 kg] 47.6 kg (04/14 1515)    Intake/Output from previous day: 04/14 0701 - 04/15 0700 In: 1818.8 [P.O.:360; I.V.:1171.9; IV Piggyback:286.9] Out: 1000 [Urine:1000] Intake/Output this shift: No intake/output data recorded.  PE: Gen:  Alert, NAD, pleasant Abd: soft, ND, NT  Lab Results:  Recent Labs    06/27/22 1608 06/28/22 0441  WBC 11.8* 8.8  HGB 9.8* 10.1*  HCT 28.5* 29.3*  PLT 234 233   BMET Recent Labs    06/27/22 1608 06/28/22 0441  NA 124* 129*  K 3.9 3.3*  CL 93* 98  CO2 22 20*  GLUCOSE 242* 293*  BUN 11 9  CREATININE 0.61 0.53  CALCIUM 8.1* 8.3*   PT/INR Recent Labs    06/27/22 2244  LABPROT 13.6  INR 1.0   CMP     Component Value Date/Time   NA 129 (L) 06/28/2022 0441   K 3.3 (L) 06/28/2022 0441   CL 98 06/28/2022 0441   CO2 20 (L) 06/28/2022 0441   GLUCOSE 293 (H) 06/28/2022 0441   BUN 9 06/28/2022 0441   CREATININE 0.53 06/28/2022 0441   CREATININE 0.58 (L) 04/28/2022 1020   CALCIUM 8.3 (L) 06/28/2022 0441   CALCIUM 8.7 04/15/2011 1003   PROT 6.6 06/28/2022 0441   ALBUMIN 2.7 (L) 06/28/2022 0441   AST 53 (H) 06/28/2022 0441   ALT 95 (H) 06/28/2022 0441   ALKPHOS 367 (H) 06/28/2022 0441   BILITOT 0.6 06/28/2022 0441   GFRNONAA >60 06/28/2022 0441   GFRAA >60 07/27/2016 0905   Lipase     Component Value Date/Time   LIPASE 51 06/27/2022 1608       Studies/Results: CT ABDOMEN PELVIS W  CONTRAST  Result Date: 06/27/2022 CLINICAL DATA:  Abdominal pain for 3 days. Leukocytosis. 3 months postop from cholecystectomy. EXAM: CT ABDOMEN AND PELVIS WITH CONTRAST TECHNIQUE: Multidetector CT imaging of the abdomen and pelvis was performed using the standard protocol following bolus administration of intravenous contrast. RADIATION DOSE REDUCTION: This exam was performed according to the departmental dose-optimization program which includes automated exposure control, adjustment of the mA and/or kV according to patient size and/or use of iterative reconstruction technique. CONTRAST:  75mL OMNIPAQUE IOHEXOL 350 MG/ML SOLN COMPARISON:  05/17/2022 FINDINGS: Lower Chest: No acute findings. Hepatobiliary: No hepatic masses identified. Increased diffuse dilatation of intrahepatic bile ducts is seen. Increased soft tissue mass or lymphadenopathy seen in the porta hepatis measuring 2.1 x 1.8 cm, which appears to obstruct the common bile duct. Pancreas: No mass or inflammatory changes. No evidence of pancreatic ductal dilatation. Spleen: Within normal limits in size and appearance. Adrenals/Urinary Tract: No suspicious masses identified. No evidence of ureteral calculi or hydronephrosis. Stable distended urinary bladder with mild diffuse wall thickening. Stomach/Bowel: No evidence of obstruction, inflammatory process or abnormal fluid collections. Normal appendix visualized. Mild diverticulosis seen involving the ascending colon, without evidence of diverticulitis. Vascular/Lymphatic: Except for enlarged lymph node or mass in the porta hepatis  described above, no other sites of lymphadenopathy identified. No acute vascular findings. Aortic atherosclerotic calcification incidentally noted. Reproductive:  No mass or other significant abnormality. Other:  None. Musculoskeletal:  No suspicious bone lesions identified. IMPRESSION: Increased diffuse dilatation of intrahepatic bile ducts due to 2.1 cm soft tissue mass or  lymphadenopathy in the porta hepatis. Differential diagnosis includes cholangiocarcinoma and metastatic lymphadenopathy. Consider ERCP for further evaluation. Stable diffuse bladder wall thickening wall thickening, which may indicate chronic cystitis or neurogenic bladder. Right colonic diverticulosis, without radiographic evidence of diverticulitis. Aortic Atherosclerosis (ICD10-I70.0). Electronically Signed   By: Danae Orleans M.D.   On: 06/27/2022 19:16   CT Angio Chest PE W and/or Wo Contrast  Result Date: 06/27/2022 CLINICAL DATA:  Chest pain in dyspnea for several days. High probability for pulmonary embolism. EXAM: CT ANGIOGRAPHY CHEST WITH CONTRAST TECHNIQUE: Multidetector CT imaging of the chest was performed using the standard protocol during bolus administration of intravenous contrast. Multiplanar CT image reconstructions and MIPs were obtained to evaluate the vascular anatomy. RADIATION DOSE REDUCTION: This exam was performed according to the departmental dose-optimization program which includes automated exposure control, adjustment of the mA and/or kV according to patient size and/or use of iterative reconstruction technique. CONTRAST:  87mL OMNIPAQUE IOHEXOL 350 MG/ML SOLN COMPARISON:  01/26/2023 FINDINGS: Cardiovascular: Satisfactory opacification of pulmonary arteries noted, and no pulmonary emboli identified. No evidence of thoracic aortic dissection or aneurysm. Aortic and coronary atherosclerotic calcification incidentally noted. Mediastinum/Nodes: No masses or pathologically enlarged lymph nodes identified. Calcified mediastinal lymph nodes are consistent with old granulomatous disease. Lungs/Pleura: No pulmonary mass, infiltrate, or effusion. Upper abdomen: No acute findings. Musculoskeletal: No suspicious bone lesions identified. Review of the MIP images confirms the above findings. IMPRESSION: No evidence of pulmonary embolism or other active disease within the thorax. Aortic  Atherosclerosis (ICD10-I70.0). Electronically Signed   By: Danae Orleans M.D.   On: 06/27/2022 19:05   DG Chest Port 1 View  Result Date: 06/27/2022 CLINICAL DATA:  Sudden onset of shortness of breath. EXAM: PORTABLE CHEST 1 VIEW COMPARISON:  Radiograph earlier today, 1 hour ago FINDINGS: The cardiomediastinal contours are normal. Unchanged blunting of left costophrenic angle. No developing airspace disease, pulmonary edema or pneumothorax. No acute osseous abnormalities are seen. IMPRESSION: Stable radiographic appearance of the chest, no interval change. Electronically Signed   By: Narda Rutherford M.D.   On: 06/27/2022 18:03   DG Chest Port 1 View  Result Date: 06/27/2022 CLINICAL DATA:  RIGHT upper quadrant pain. EXAM: PORTABLE CHEST 1 VIEW COMPARISON:  Chest x-ray dated 04/04/2022 FINDINGS: Lungs are now clear. No pleural effusion or pneumothorax is seen. Heart size and mediastinal contours are within normal limits. Osseous structures about the chest are unremarkable. IMPRESSION: No active disease. Lungs are now clear. Electronically Signed   By: Bary Richard M.D.   On: 06/27/2022 16:52    Anti-infectives: Anti-infectives (From admission, onward)    Start     Dose/Rate Route Frequency Ordered Stop   06/28/22 2200  vancomycin (VANCOREADY) IVPB 500 mg/100 mL        500 mg 100 mL/hr over 60 Minutes Intravenous Every 24 hours 06/27/22 2212     06/28/22 0600  aztreonam (AZACTAM) 1 g in sodium chloride 0.9 % 100 mL IVPB  Status:  Discontinued        1 g 200 mL/hr over 30 Minutes Intravenous Every 8 hours 06/27/22 2133 06/27/22 2205   06/28/22 0600  aztreonam (AZACTAM) 2 g in sodium chloride 0.9 % 100  mL IVPB        2 g 200 mL/hr over 30 Minutes Intravenous Every 8 hours 06/27/22 2205     06/27/22 2215  vancomycin (VANCOCIN) IVPB 1000 mg/200 mL premix        1,000 mg 200 mL/hr over 60 Minutes Intravenous  Once 06/27/22 2207 06/28/22 0127   06/27/22 2200  metroNIDAZOLE (FLAGYL) IVPB 500 mg         500 mg 100 mL/hr over 60 Minutes Intravenous Every 12 hours 06/27/22 2055 07/04/22 2159   06/27/22 2200  aztreonam (AZACTAM) 2 g in sodium chloride 0.9 % 100 mL IVPB        2 g 200 mL/hr over 30 Minutes Intravenous  Once 06/27/22 2138 06/28/22 0021   06/27/22 2100  aztreonam (AZACTAM) 2 g in sodium chloride 0.9 % 100 mL IVPB  Status:  Discontinued        2 g 200 mL/hr over 30 Minutes Intravenous  Once 06/27/22 2055 06/27/22 2133   06/27/22 1630  metroNIDAZOLE (FLAGYL) IVPB 500 mg  Status:  Discontinued        500 mg 100 mL/hr over 60 Minutes Intravenous Every 12 hours 06/27/22 1617 06/27/22 1621   06/27/22 1630  piperacillin-tazobactam (ZOSYN) IVPB 3.375 g        3.375 g 100 mL/hr over 30 Minutes Intravenous  Once 06/27/22 1621 06/27/22 1731        Assessment/Plan Ascending cholangitis s/p ERCP 03/29/2022 S/p laparoscopic partial cholecystectomy 03/31/2022 by Dr. Derrell Lolling  Hx Hepatic abscesses  RUQ pain, fever, Elevated LFTs - CT concerning for adenopathy versus soft tissue mass surrounding the porta hepatis causing biliary obstruction; no signs of recurrent hepatic abscess. LFTs mildly elevated with normal Tbili - MRCP pending. Will follow up on results and possible GI intervention.  ID - azactam, flagyl, vancomycin FEN - IVF, FLD VTE - ok for chemical dvt ppx from surgical standpoint Foley - none     LOS: 1 day    Franne Forts, [facility] 06/28/2022, 10:57 AM Please see Amion for pager number during day hours 7:00am-4:30pm

## 2022-06-28 NOTE — Consult Note (Signed)
[facility]       Reason for Consult: history of hepatic abscess   Referring Physician: Dr. Adela Glimpse  Principal Problem:   Sepsis Active Problems:   HTN (hypertension)   Hyperlipidemia   Macrocytic anemia   LFT elevation   Hyponatremia   Allergic reaction   Hypomagnesemia    insulin aspart  0-9 Units Subcutaneous Q4H   levothyroxine  75 mcg Oral Daily   pravastatin  80 mg Oral Daily    Recommendations: Ampicillin/sulbactam monitor  Assessment: She has biliary obstruction and concern for developement of cancer.  Hepatic abscess with near resolution on MR.  Will resume antibiotics for now.  She did develop a sensation of shortness of breath but no throat closing, no rash and she had been on amoxicillin for a prolonged time so not c/w allergic reaction.  Will resume ampicillin/    HPI: Lauren [name] is a 82 y.o. 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  She had recently completed prolonged antibiotics but developed a fever to 101.6 and given piperacillintazobactam and developed a feeling of shortness of breath.  No throat closing, no rash and resolved after less than one minute.  Also received fentanyl.  Has been on proloned amoxicillin/clavulanate.  Scan now with findings of obstruction and concern for cholangiocarcinoma.    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   Possible related to pyridium  G6PD studies pending   Hemorrhoids    Hiatal  hernia    Hyperlipidemia    Hyperlipidemia    Hypertension    Hypothyroidism    IBS (irritable bowel syndrome)    Iron overload 07/13/2016   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   Osteoporosis    UTI (lower urinary tract infection)    Vitamin D deficiency     Social History   Tobacco Use   Smoking status: Never   Smokeless tobacco: Never  Substance Use Topics   Alcohol use: No   Drug use: No    Family History  Problem Relation Age of Onset   Colon cancer Neg Hx     Allergies  Allergen Reactions   Phenazopyridine Anaphylaxis   Pyridium [Phenazopyridine Hcl] Other (See Comments)    Hemolytic anemia   Sulfamethoxazole-Trimethoprim Anaphylaxis   Zosyn [Piperacillin Sod-Tazobactam So] Shortness Of Breath   Sulfa Antibiotics     Potential allergy: oxidizing drug: methemoglobinemia/hemolysis on pyridium   Ciprofloxacin Hcl     Sx's almost like a heart attack   Esomeprazole Magnesium Nausea Only    Pain and nausea   Ferrous Sulfate Other (See Comments)   Macrodantin [Nitrofurantoin Macrocrystal] Other (See Comments)    GI Upset   Pantoprazole Sodium Nausea Only    abd pain and nausea   Promethazine Diarrhea   Tramadol Nausea Only    Physical Exam: Constitutional: in no apparent distress  Vitals:   06/28/22 0813 06/28/22 1232  BP: (!) 153/62 (!) 178/55  Pulse: 63 63  Resp: 18 20  Temp: 97.9 F (36.6 C) 98 F (36.7 C)  SpO2: 100% 100%   EYES: anicteric Respiratory: normal respiratory effort GI: soft  Lab Results  Component Value Date   WBC 8.8 06/28/2022   HGB 10.1 (L) 06/28/2022   HCT 29.3 (L) 06/28/2022   MCV 87.2 06/28/2022   PLT 233 06/28/2022    Lab Results  Component Value Date   CREATININE 0.53 06/28/2022   BUN 9 06/28/2022   NA 129 (L) 06/28/2022   K 3.3 (L) 06/28/2022   CL 98 06/28/2022   CO2 20 (L) 06/28/2022    Lab Results  Component Value Date   ALT 95 (H) 06/28/2022   AST 53 (H) 06/28/2022   GGT 415 (H) 03/27/2022   ALKPHOS 367 (H)  06/28/2022     Microbiology: Recent Results (from the past 240 hour(s))  SARS Coronavirus 2 by RT PCR (hospital order, performed in [facility] hospital lab) *cepheid single result test* Anterior Nasal Swab     Status: None   Collection Time: 06/27/22  4:29 PM   Specimen: Anterior Nasal Swab  Result Value Ref Range Status   SARS Coronavirus 2 by RT PCR NEGATIVE NEGATIVE Final    Comment: (NOTE) SARS-CoV-2 target nucleic acids are NOT DETECTED.  The SARS-CoV-2 RNA is generally detectable in upper and lower respiratory specimens during the acute phase of infection. The lowest concentration of SARS-CoV-2 viral copies this assay can detect is 250 copies / mL. A negative result does not preclude SARS-CoV-2 infection and should not be used as the sole basis for treatment or other patient management decisions.  A negative result may occur with improper specimen collection / handling, submission of specimen other than nasopharyngeal swab, presence of viral mutation(s) within the areas targeted by this assay, and inadequate number of viral copies (<250 copies / mL). A negative result must be combined with clinical observations, patient history, and epidemiological information.  Fact Sheet for Patients:   RoadLapTop.co.za  Fact Sheet for Healthcare Providers: http://[place]-miller.com/  This test is not yet approved or  cleared by the [place] FDA and has been authorized for detection and/or diagnosis of SARS-CoV-2 by FDA under an Emergency Use Authorization (EUA).  This EUA will remain in effect (meaning this test can be used) for the duration of the COVID-19 declaration under Section 564(b)(1) of the Act, 21 U.S.C. section 360bbb-3(b)(1), unless the authorization is terminated or revoked sooner.  Performed at [facility], [address], [place], [place] [address]   Blood culture (routine x 2)     Status: None  (Preliminary result)   Collection Time: 06/27/22  4:45 PM   Specimen: BLOOD RIGHT FOREARM  Result Value Ref Range Status   Specimen Description   Final    BLOOD RIGHT FOREARM Performed at [facility], [address], [place], [place] [address]    Special Requests   Final    BOTTLES DRAWN AEROBIC ONLY Blood Culture adequate volume Performed at [facility], [address], [place], [place] [address]    Culture   Final    NO GROWTH < 12 HOURS Performed at [facility], [address], [place], [place] [address]    Report Status PENDING  Incomplete  Blood culture (routine x 2)     Status: None (Preliminary result)   Collection Time: 06/27/22  4:51 PM   Specimen: BLOOD  Result Value Ref Range Status  Specimen Description   Final    BLOOD LEFT ANTECUBITAL Performed at [facility], [address] [place], [place] [address]    Special Requests   Final    BOTTLES DRAWN AEROBIC AND ANAEROBIC Blood Culture adequate volume Performed at [facility], [address], [place], [place] [address]    Culture   Final    NO GROWTH < 12 HOURS Performed at [facility], [address], [place], [place] [address]    Report Status PENDING  Incomplete  Respiratory (~20 pathogens) panel by PCR     Status: None   Collection Time: 06/27/22  5:45 PM  Result Value Ref Range Status   Adenovirus NOT DETECTED NOT DETECTED Final   Coronavirus 229E NOT DETECTED NOT DETECTED Final    Comment: (NOTE) The Coronavirus on the Respiratory Panel, DOES NOT test for the novel  Coronavirus (2019 nCoV)    Coronavirus HKU1 NOT DETECTED NOT DETECTED Final   Coronavirus NL63 NOT DETECTED NOT DETECTED Final   Coronavirus OC43 NOT DETECTED NOT DETECTED Final   Metapneumovirus NOT DETECTED NOT DETECTED Final   Rhinovirus / Enterovirus NOT DETECTED NOT DETECTED Final   Influenza A NOT DETECTED NOT DETECTED Final   Influenza B NOT DETECTED  NOT DETECTED Final   Parainfluenza Virus 1 NOT DETECTED NOT DETECTED Final   Parainfluenza Virus 2 NOT DETECTED NOT DETECTED Final   Parainfluenza Virus 3 NOT DETECTED NOT DETECTED Final   Parainfluenza Virus 4 NOT DETECTED NOT DETECTED Final   Respiratory Syncytial Virus NOT DETECTED NOT DETECTED Final   Bordetella pertussis NOT DETECTED NOT DETECTED Final   Bordetella Parapertussis NOT DETECTED NOT DETECTED Final   Chlamydophila pneumoniae NOT DETECTED NOT DETECTED Final   Mycoplasma pneumoniae NOT DETECTED NOT DETECTED Final    Comment: Performed at [facility], [address] [place], [place] [address]    Gardiner Barefoot, MD [facility] [email or web address] 06/28/2022, 3:40 PM

## 2022-06-28 NOTE — Progress Notes (Signed)
Patient ID: Lauren [name], female   DOB: [birth year], 82 y.o.   MRN: [id number]  General Surgery on-call  This is an 82 year old female who is status post laparoscopic partial cholecystectomy 03/31/2022 by Dr. Derrell Lolling complicated by hepatic abscesses.  She has been treated by infectious disease.  She presented with 3 days of worsening right upper quadrant abdominal pain and subjective fever.  Liver function tests are abnormal with an elevated AST, ALT, and alkaline phosphatase.  Total bilirubin is 1.1.  CT scan showed diffuse dilatation of the intrahepatic bile ducts due to a mass or lymphadenopathy in the porta hepatis.  MRCP has been ordered.  GI has been consulted for possible ERCP.  At this time, there are no acute surgical issues identified.  However we will follow along to see the results of the MRCP and GI intervention.  Wilmon Arms. Corliss Skains, MD, [facility]  General Surgery   06/28/2022 3:20 AM

## 2022-06-28 NOTE — Progress Notes (Signed)
PROGRESS NOTE  Lauren [name] BWI:[id number] DOB: 1940-07-17 DOA: 06/27/2022 PCP: Pearson Grippe, MD   LOS: 1 day   Brief Narrative / Interim history: 82 year old [place] speaking female with history of DM2, HLD, hypothyroidism, CKD 2, recurrent UTIs, chronic urinary obstruction requiring self-catheterization comes into the hospital with right upper quadrant pain.  This has been going on for the past 3 days, associated with nausea.  She apparently had cholecystectomy 3 months ago and her hospitalization has been complicated by liver abscesses, elevated LFTs.  She follows with ID as an outpatient and just finished antibiotics last month.  She was febrile to 101.6 in the ER but that was after she received Zosyn and had an apparent allergic reaction with shortness of breath, hypoxia, wheezing requiring Solu-Medrol  Subjective / 24h Interval events: Doing well this morning, iPad interpreter used.  She denies any abdominal pain, no nausea or vomiting this morning.  Overall she feels well.  Assesement and Plan: Principal Problem:   Sepsis Active Problems:   HTN (hypertension)   Hyperlipidemia   Macrocytic anemia   LFT elevation   Hyponatremia   Allergic reaction   Hypomagnesemia  Principal problem Elevated LFTs, abdominal pain, nausea, vomiting -imaging in the ER with CT scan abdomen pelvis showed increased diffuse dilatation of intrahepatic bile ducts due to 2.5 cm soft tissue mass or lymphadenopathy in the porta hepatis.  There is concern for cholangiocarcinoma versus metastatic lymphadenopathy.  GI and general surgery were consulted -Has been placed on antibiotics, continue -MRI/MRCP pending  Active problems Hyponatremia -likely in the setting of poor p.o. intake given hypochloremia as well.  Received fluids, sodium improving  Hypokalemia -replace potassium  Neurogenic bladder -continue self- catheterization.  She does have evidence of neurogenic bladder on the CT scan  Diverticulosis  -incidentally noted  Anemia, of chronic disease -hemoglobin stable, no bleeding  History of hepatic abscess -noted in January, completed antibiotics, seen by ID most recently on 3/13 when her antibiotics were stopped.  Repeat imaging showed resolution of her abscesses.  Awaiting MRI/MRCP  Hyperlipidemia -continue statin   Allergic reaction to Zosyn -in the ER on 4/14  Hypokalemia, hypomagnesemia -replace and continue to monitor  Essential hypertension -monitor blood pressure, if persists high will need resumption of her home Cozaar (50 mg)  DM2, with hyperglycemia -high CBGs likely in the setting of the steroid she got in the ER.  Hold home Janumet.  Continue sliding scale  CBG (last 3)  Recent Labs    06/27/22 2322 06/28/22 0415 06/28/22 0720  GLUCAP 237* 289* 215*    Lab Results  Component Value Date   HGBA1C 6.3 (H) 04/01/2022    Scheduled Meds:  insulin aspart  0-9 Units Subcutaneous Q4H   levothyroxine  75 mcg Oral Daily   pravastatin  80 mg Oral Daily   Continuous Infusions:  aztreonam 2 g (06/28/22 0548)   lactated ringers 150 mL/hr (06/28/22 0025)   metronidazole 500 mg (06/27/22 2239)   vancomycin     PRN Meds:.albuterol, diphenhydrAMINE, fentaNYL (SUBLIMAZE) injection, ondansetron **OR** ondansetron (ZOFRAN) IV  Current Outpatient Medications  Medication Instructions   acetaminophen (TYLENOL) 1,000 mg, Oral, Every 6 hours PRN   amoxicillin-clavulanate (AUGMENTIN) 875-125 MG tablet 1 tablet, 2 times daily   glucosamine-chondroitin 500-400 MG tablet 1 tablet, Oral, 3 times daily   glucose blood (ACCU-CHEK AVIVA PLUS) test strip Use to check blood sugar 3 times daily.   levothyroxine (SYNTHROID) 75 mcg, Oral, Daily,     losartan (COZAAR) 50  mg, Oral, Daily,     meclizine (ANTIVERT) 25 mg, Oral, Every 12 hours PRN   Multiple Vitamins-Minerals (MULTIVITAMIN WITH MINERALS) tablet 1 tablet, Oral, Daily   omeprazole (PRILOSEC) 40 mg, Oral, Daily   pravastatin  (PRAVACHOL) 80 mg, Oral, Daily   sitaGLIPtan-metformin (JANUMET) 50-500 MG per tablet 1 tablet, Oral, 2 times daily with meals,      Diet Orders (From admission, onward)     Start     Ordered   06/28/22 1057  Diet full liquid Room service appropriate? Yes; Fluid consistency: Thin  Diet effective now       Question Answer Comment  Room service appropriate? Yes   Fluid consistency: Thin      06/28/22 1056            DVT prophylaxis: SCDs Start: 06/27/22 2158   Lab Results  Component Value Date   PLT 233 06/28/2022      Code Status: Full Code  Family Communication: no family at bedside   Status is: Inpatient  Remains inpatient appropriate because: severity of illness  Level of care: Progressive  Consultants:  GI Surgery  Objective: Vitals:   06/27/22 2202 06/28/22 0121 06/28/22 0422 06/28/22 0813  BP: (!) 150/63 (!) 148/58 (!) 167/71 (!) 153/62  Pulse: 82 68 67 63  Resp: Temp: 97.9 F (36.6 C) (!) 97.3 F (36.3 C) 97.8 F (36.6 C) 97.9 F (36.6 C)  TempSrc: Oral Oral Oral Oral  SpO2: 100% 100% 100% 100%  Weight:      Height:        Intake/Output Summary (Last 24 hours) at 06/28/2022 1101 Last data filed at 06/28/2022 0600 Gross per 24 hour  Intake 1818.8 ml  Output 1000 ml  Net 818.8 ml   Wt Readings from Last 3 Encounters:  06/27/22 47.6 kg  05/26/22 46.3 kg  04/28/22 49 kg    Examination: Constitutional: NAD Eyes: no scleral icterus ENMT: Mucous membranes are moist.  Neck: normal, supple Respiratory: clear to auscultation bilaterally, no wheezing, no crackles. Normal respiratory effort. No accessory muscle use.  Cardiovascular: Regular rate and rhythm, no murmurs / rubs / gallops. No LE edema.  Abdomen: non distended, no tenderness. Bowel sounds positive.  Musculoskeletal: no clubbing / cyanosis.   Data Reviewed: I have independently reviewed following labs and imaging studies   CBC Recent Labs  Lab 06/27/22 1608  06/28/22 0441  WBC 11.8* 8.8  HGB 9.8* 10.1*  HCT 28.5* 29.3*  PLT 234 233  MCV 86.9 87.2  MCH 29.9 30.1  MCHC 34.4 34.5  RDW 13.3 13.6  LYMPHSABS 1.3  --   MONOABS 0.4  --   EOSABS 0.0  --   BASOSABS 0.0  --     Recent Labs  Lab 06/27/22 1608 06/27/22 1716 06/27/22 1936 06/27/22 2244 06/28/22 0441  NA 124*  --   --   --  129*  K 3.9  --   --   --  3.3*  CL 93*  --   --   --  98  CO2 22  --   --   --  20*  GLUCOSE 242*  --   --   --  293*  BUN 11  --   --   --  9  CREATININE 0.61  --   --   --  0.53  CALCIUM 8.1*  --   --   --  8.3*  AST 95*  --   --   --  53*  ALT 115*  --   --   --  95*  ALKPHOS 396*  --   --   --  367*  BILITOT 1.1  --   --   --  0.6  ALBUMIN 2.8*  --   --   --  2.7*  MG  --   --   --  1.5* 2.1  CRP  --   --   --  12.9*  --   PROCALCITON  --   --   --  5.58  --   LATICACIDVEN  --  2.5* 1.2  --   --   INR  --   --   --  1.0  --   TSH  --   --   --  1.035  --     ------------------------------------------------------------------------------------------------------------------ No results for input(s): "CHOL", "HDL", "LDLCALC", "TRIG", "CHOLHDL", "LDLDIRECT" in the last 72 hours.  Lab Results  Component Value Date   HGBA1C 6.3 (H) 04/01/2022   ------------------------------------------------------------------------------------------------------------------ Recent Labs    06/27/22 2244  TSH 1.035    Cardiac Enzymes No results for input(s): "CKMB", "TROPONINI", "MYOGLOBIN" in the last 168 hours.  Invalid input(s): "CK" ------------------------------------------------------------------------------------------------------------------ No results found for: "BNP"  CBG: Recent Labs  Lab 06/27/22 2322 06/28/22 0415 06/28/22 0720  GLUCAP 237* 289* 215*    Recent Results (from the past 240 hour(s))  SARS Coronavirus 2 by RT PCR (hospital order, performed in [facility] hospital lab) *cepheid single result test* Anterior Nasal Swab      Status: None   Collection Time: 06/27/22  4:29 PM   Specimen: Anterior Nasal Swab  Result Value Ref Range Status   SARS Coronavirus 2 by RT PCR NEGATIVE NEGATIVE Final    Comment: (NOTE) SARS-CoV-2 target nucleic acids are NOT DETECTED.  The SARS-CoV-2 RNA is generally detectable in upper and lower respiratory specimens during the acute phase of infection. The lowest concentration of SARS-CoV-2 viral copies this assay can detect is 250 copies / mL. A negative result does not preclude SARS-CoV-2 infection and should not be used as the sole basis for treatment or other patient management decisions.  A negative result may occur with improper specimen collection / handling, submission of specimen other than nasopharyngeal swab, presence of viral mutation(s) within the areas targeted by this assay, and inadequate number of viral copies (<250 copies / mL). A negative result must be combined with clinical observations, patient history, and epidemiological information.  Fact Sheet for Patients:   RoadLapTop.co.za  Fact Sheet for Healthcare Providers: http://Pegg-miller.com/  This test is not yet approved or  cleared by the [place] FDA and has been authorized for detection and/or diagnosis of SARS-CoV-2 by FDA under an Emergency Use Authorization (EUA).  This EUA will remain in effect (meaning this test can be used) for the duration of the COVID-19 declaration under Section 564(b)(1) of the Act, 21 U.S.C. section 360bbb-3(b)(1), unless the authorization is terminated or revoked sooner.  Performed at [facility] [address], [place], [place] [address]   Blood culture (routine x 2)     Status: None (Preliminary result)   Collection Time: 06/27/22  4:45 PM   Specimen: BLOOD RIGHT FOREARM  Result Value Ref Range Status   Specimen Description   Final    BLOOD RIGHT FOREARM Performed at [facility], [address], [place], [place] [address]    Special Requests   Final    BOTTLES DRAWN AEROBIC ONLY  Blood Culture adequate volume Performed at [facility], [address] [place], [place] [address]    Culture   Final    NO GROWTH < 12 HOURS Performed at [facility], [address] [place], [place] [address]    Report Status PENDING  Incomplete  Blood culture (routine x 2)     Status: None (Preliminary result)   Collection Time: 06/27/22  4:51 PM   Specimen: BLOOD  Result Value Ref Range Status   Specimen Description   Final    BLOOD LEFT ANTECUBITAL Performed at [facility], [address] [place], [place] [address]    Special Requests   Final    BOTTLES DRAWN AEROBIC AND ANAEROBIC Blood Culture adequate volume Performed at [facility], [address] [place], [place] [address]    Culture   Final    NO GROWTH < 12 HOURS Performed at [facility], [address] [place], [place] [address]    Report Status PENDING  Incomplete  Respiratory (~20 pathogens) panel by PCR     Status: None   Collection Time: 06/27/22  5:45 PM  Result Value Ref Range Status   Adenovirus NOT DETECTED NOT DETECTED Final   Coronavirus 229E NOT DETECTED NOT DETECTED Final    Comment: (NOTE) The Coronavirus on the Respiratory Panel, DOES NOT test for the novel  Coronavirus (2019 nCoV)    Coronavirus HKU1 NOT DETECTED NOT DETECTED Final   Coronavirus NL63 NOT DETECTED NOT DETECTED Final   Coronavirus OC43 NOT DETECTED NOT DETECTED Final   Metapneumovirus NOT DETECTED NOT DETECTED Final   Rhinovirus / Enterovirus NOT DETECTED NOT DETECTED Final   Influenza A NOT DETECTED NOT DETECTED Final   Influenza B NOT DETECTED NOT DETECTED Final   Parainfluenza Virus 1 NOT DETECTED NOT DETECTED Final   Parainfluenza Virus 2 NOT DETECTED NOT DETECTED Final   Parainfluenza Virus 3 NOT DETECTED NOT DETECTED Final   Parainfluenza Virus 4 NOT  DETECTED NOT DETECTED Final   Respiratory Syncytial Virus NOT DETECTED NOT DETECTED Final   Bordetella pertussis NOT DETECTED NOT DETECTED Final   Bordetella Parapertussis NOT DETECTED NOT DETECTED Final   Chlamydophila pneumoniae NOT DETECTED NOT DETECTED Final   Mycoplasma pneumoniae NOT DETECTED NOT DETECTED Final    Comment: Performed at [facility], [address], [place], [place] [address]     Radiology Studies: CT ABDOMEN PELVIS W CONTRAST  Result Date: 06/27/2022 CLINICAL DATA:  Abdominal pain for 3 days. Leukocytosis. 3 months postop from cholecystectomy. EXAM: CT ABDOMEN AND PELVIS WITH CONTRAST TECHNIQUE: Multidetector CT imaging of the abdomen and pelvis was performed using the standard protocol following bolus administration of intravenous contrast. RADIATION DOSE REDUCTION: This exam was performed according to the departmental dose-optimization program which includes automated exposure control, adjustment of the mA and/or kV according to patient size and/or use of iterative reconstruction technique. CONTRAST:  75mL OMNIPAQUE IOHEXOL 350 MG/ML SOLN COMPARISON:  05/17/2022 FINDINGS: Lower Chest: No acute findings. Hepatobiliary: No hepatic masses identified. Increased diffuse dilatation of intrahepatic bile ducts is seen. Increased soft tissue mass or lymphadenopathy seen in the porta hepatis measuring 2.1 x 1.8 cm, which appears to obstruct the common bile duct. Pancreas: No mass or inflammatory changes. No evidence of pancreatic ductal dilatation. Spleen: Within normal limits in size and appearance. Adrenals/Urinary Tract: No suspicious masses identified. No evidence of ureteral calculi or hydronephrosis. Stable distended urinary bladder with mild diffuse wall  thickening. Stomach/Bowel: No evidence of obstruction, inflammatory process or abnormal fluid collections. Normal appendix visualized. Mild diverticulosis seen involving the ascending colon, without evidence of diverticulitis.  Vascular/Lymphatic: Except for enlarged lymph node or mass in the porta hepatis described above, no other sites of lymphadenopathy identified. No acute vascular findings. Aortic atherosclerotic calcification incidentally noted. Reproductive:  No mass or other significant abnormality. Other:  None. Musculoskeletal:  No suspicious bone lesions identified. IMPRESSION: Increased diffuse dilatation of intrahepatic bile ducts due to 2.1 cm soft tissue mass or lymphadenopathy in the porta hepatis. Differential diagnosis includes cholangiocarcinoma and metastatic lymphadenopathy. Consider ERCP for further evaluation. Stable diffuse bladder wall thickening wall thickening, which may indicate chronic cystitis or neurogenic bladder. Right colonic diverticulosis, without radiographic evidence of diverticulitis. Aortic Atherosclerosis (ICD10-I70.0). Electronically Signed   By: Danae Orleans M.D.   On: 06/27/2022 19:16   CT Angio Chest PE W and/or Wo Contrast  Result Date: 06/27/2022 CLINICAL DATA:  Chest pain in dyspnea for several days. High probability for pulmonary embolism. EXAM: CT ANGIOGRAPHY CHEST WITH CONTRAST TECHNIQUE: Multidetector CT imaging of the chest was performed using the standard protocol during bolus administration of intravenous contrast. Multiplanar CT image reconstructions and MIPs were obtained to evaluate the vascular anatomy. RADIATION DOSE REDUCTION: This exam was performed according to the departmental dose-optimization program which includes automated exposure control, adjustment of the mA and/or kV according to patient size and/or use of iterative reconstruction technique. CONTRAST:  75mL OMNIPAQUE IOHEXOL 350 MG/ML SOLN COMPARISON:  01/26/2023 FINDINGS: Cardiovascular: Satisfactory opacification of pulmonary arteries noted, and no pulmonary emboli identified. No evidence of thoracic aortic dissection or aneurysm. Aortic and coronary atherosclerotic calcification incidentally noted.  Mediastinum/Nodes: No masses or pathologically enlarged lymph nodes identified. Calcified mediastinal lymph nodes are consistent with old granulomatous disease. Lungs/Pleura: No pulmonary mass, infiltrate, or effusion. Upper abdomen: No acute findings. Musculoskeletal: No suspicious bone lesions identified. Review of the MIP images confirms the above findings. IMPRESSION: No evidence of pulmonary embolism or other active disease within the thorax. Aortic Atherosclerosis (ICD10-I70.0). Electronically Signed   By: Danae Orleans M.D.   On: 06/27/2022 19:05   DG Chest Port 1 View  Result Date: 06/27/2022 CLINICAL DATA:  Sudden onset of shortness of breath. EXAM: PORTABLE CHEST 1 VIEW COMPARISON:  Radiograph earlier today, 1 hour ago FINDINGS: The cardiomediastinal contours are normal. Unchanged blunting of left costophrenic angle. No developing airspace disease, pulmonary edema or pneumothorax. No acute osseous abnormalities are seen. IMPRESSION: Stable radiographic appearance of the chest, no interval change. Electronically Signed   By: Narda Rutherford M.D.   On: 06/27/2022 18:03   DG Chest Port 1 View  Result Date: 06/27/2022 CLINICAL DATA:  RIGHT upper quadrant pain. EXAM: PORTABLE CHEST 1 VIEW COMPARISON:  Chest x-ray dated 04/04/2022 FINDINGS: Lungs are now clear. No pleural effusion or pneumothorax is seen. Heart size and mediastinal contours are within normal limits. Osseous structures about the chest are unremarkable. IMPRESSION: No active disease. Lungs are now clear. Electronically Signed   By: Bary Richard M.D.   On: 06/27/2022 16:52     Pamella Pert, MD, PhD [facility]  Between 7 am - 7 pm I am available, please contact me via Amion (for emergencies) or Securechat (non urgent messages)  Between 7 pm - 7 am I am not available, please contact night coverage MD/APP via Amion

## 2022-06-28 NOTE — Consult Note (Addendum)
Consultation  Referring Provider:  [facility] Primary Care Physician:  Pearson Grippe, MD Primary Gastroenterologist:  Dr.Pyrtle  Reason for Consultation: Acute right upper quadrant pain x 3 days/fever/concern for biliary obstruction on CT  HPI: Lauren [name] is a non-English speaking [place] 82 y.o. female, who was admitted through the emergency room yesterday after she presented with 3-day history of what was described as severe right upper quadrant abdominal pain.  Per the notes this was described as sharp, worse with deep breath, associated loss of appetite but no nausea or vomiting.  Having hot and cold spells at home but no documented fever.  She was febrile in the emergency room to 102.  Patient had been hospitalized in early January 2024 with upper abdominal pain, fever and jaundice.  She underwent ERCP with Dr. Leone Payor on 03/29/2022 for suspected ascending cholangitis.  The common bile duct, and common hepatic duct measured about 10 mm, there were no definite filling defects in the distal common bile duct but there was evidence of sludge and mucopurulent debris with sweeping of the duct. She then underwent laparoscopic partial cholecystectomy on 03/31/2022 per Dr. Derrell Lolling. Course was complicated by hepatic abscesses.  She had MRCP on 04/05/2022 that showed 2 small lesions in segment 5 of the liver consistent with intrahepatic abscesses, the largest was 2.2 x 1.6 x 2.1 cm and the smaller was 1.1 x 0.6 cm.  Also had moderate pleural effusions and diffuse body wall edema noted at that time.  She did have an acute bump in LFTs at that time as well.  She was discharged on Augmentin and followed up with ID with plans to complete a 4-week course of antibiotics and then reimage.  On repeat CT on 05/17/2022 there was interval resolution of the smaller of the 2 abscesses and the larger lesion had decreased from 16 mm to 8 mm.  Noted to have moderate pneumobilia and mild ductal dilation.  Antibiotics were  discontinued on 05/26/2022. The patient had been doing well in the interval until 4 days ago when she developed the severe right upper quadrant pain.  CT yesterday showed increase in diffuse dilation of the intrahepatic ducts, increase in size of soft tissue mass or adenopathy at the porta hepatis measuring 2.1 x 1.8 cm which appears to be obstructing the duct.  MRI/MRCP is pending today  On admission WBC 11.8/hemoglobin 9.8/hematocrit 28.5 Lactate 2.5 Lipase within normal limits Sodium 124/potassium 3.9/BUN 11/creatinine 0.61 T. bili 1.1/alk phos 3 96/AST 95/ALT 115 Sed rate 90/CRP 12.9 Blood cultures pending  Today WBC 8.8 Sodium 129/potassium 3.3 T. 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   Possible related to pyridium  G6PD studies pending   Hemorrhoids    Hiatal hernia    Hyperlipidemia    Hyperlipidemia    Hypertension    Hypothyroidism    IBS (irritable bowel syndrome)    Iron overload 07/13/2016   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   Osteoporosis    UTI (lower urinary tract infection)    Vitamin D deficiency     Past Surgical History:  Procedure Laterality Date   CHOLECYSTECTOMY N/A 03/31/2022  Procedure: LAPAROSCOPIC CHOLECYSTECTOMY;  Surgeon: Axel Filler, MD;  Location: [facility];  Service: General;  Laterality: N/A;   COLONOSCOPY     ERCP N/A 03/29/2022   Procedure: ENDOSCOPIC RETROGRADE CHOLANGIOPANCREATOGRAPHY (ERCP);  Surgeon: Iva Boop, MD;  Location: [facility];  Service: Gastroenterology;  Laterality: N/A;   PARTIAL KNEE ARTHROPLASTY Left 02/25/2020   Procedure: UNICOMPARTMENTAL KNEE;  Surgeon: Ollen Gross, MD;  Location: [facility];  Service: Orthopedics;  Laterality: Left;     REMOVAL OF STONES  03/29/2022   Procedure: REMOVAL OF STONES;  Surgeon: Iva Boop, MD;  Location: [facility];  Service: Gastroenterology;;   Dennison Mascot  03/29/2022   Procedure: Dennison Mascot;  Surgeon: Iva Boop, MD;  Location: [facility];  Service: Gastroenterology;;    Prior to Admission medications   Medication Sig Start Date End Date Taking? Authorizing Provider  acetaminophen (TYLENOL) 500 MG tablet Take 1,000 mg by mouth every 6 (six) hours as needed for mild pain.    [provider]  amoxicillin-clavulanate (AUGMENTIN) 875-125 MG tablet Take 1 tablet by mouth 2 (two) times daily. Patient not taking: Reported on 05/26/2022    [provider]  glucosamine-chondroitin 500-400 MG tablet Take 1 tablet by mouth 3 (three) times daily.    [provider]  glucose blood (ACCU-CHEK AVIVA PLUS) test strip Use to check blood sugar 3 times daily. 01/20/22     levothyroxine (SYNTHROID, LEVOTHROID) 75 MCG tablet Take 75 mcg by mouth daily.    [provider]  losartan (COZAAR) 50 MG tablet Take 50 mg by mouth daily.    [provider]  meclizine (ANTIVERT) 25 MG tablet Take 25 mg by mouth every 12 (twelve) hours as needed for dizziness. 11/02/21   [provider]  Multiple Vitamins-Minerals (MULTIVITAMIN WITH MINERALS) tablet Take 1 tablet by mouth daily.    [provider]  omeprazole (PRILOSEC) 40 MG capsule Take 1 capsule (40 mg total) by mouth daily. Patient taking differently: Take 40 mg by mouth daily as needed (indigestion). 03/12/15   Pyrtle, Carie Caddy, MD  pravastatin (PRAVACHOL) 80 MG tablet Take 80 mg by mouth daily. 01/12/20   [provider]  sitaGLIPtan-metformin (JANUMET) 50-500 MG per tablet Take 1 tablet by mouth 2 (two) times daily with a meal.    [provider]    Current Facility-Administered Medications  Medication Dose Route Frequency Provider Last Rate Last Admin   albuterol  (PROVENTIL) (2.5 MG/3ML) 0.083% nebulizer solution 2.5 mg  2.5 mg Nebulization Q2H PRN Doutova, Anastassia, MD       aztreonam (AZACTAM) 2 g in sodium chloride 0.9 % 100 mL IVPB  2 g Intravenous Q8H Jamse Mead, RPH 200 mL/hr at 06/28/22 0548 2 g at 06/28/22 0548   diphenhydrAMINE (BENADRYL) injection 25 mg  25 mg Intravenous Q6H PRN Doutova, Anastassia, MD       fentaNYL (SUBLIMAZE) injection 12.5-50 mcg  12.5-50 mcg Intravenous Q2H PRN Doutova, Anastassia, MD       insulin aspart (novoLOG) injection 0-9 Units  0-9 Units Subcutaneous Q4H Doutova, Anastassia, MD   3 Units at 06/28/22 0829   lactated ringers infusion  150 mL/hr Intravenous Continuous Doutova, Anastassia, MD 150 mL/hr at 06/28/22 0025 150 mL/hr at 06/28/22 0025   levothyroxine (SYNTHROID) tablet 75 mcg  75 mcg Oral Daily Therisa Doyne, MD   75 mcg at 06/28/22 0549   metroNIDAZOLE (FLAGYL) IVPB 500 mg  500 mg Intravenous Q12H Therisa Doyne, MD 100 mL/hr at 06/27/22 2239  500 mg at 06/27/22 2239   ondansetron (ZOFRAN) tablet 4 mg  4 mg Oral Q6H PRN Therisa Doyne, MD       Or   ondansetron (ZOFRAN) injection 4 mg  4 mg Intravenous Q6H PRN Doutova, Anastassia, MD       pravastatin (PRAVACHOL) tablet 80 mg  80 mg Oral Daily Doutova, Anastassia, MD       vancomycin (VANCOREADY) IVPB 500 mg/100 mL  500 mg Intravenous Q24H Gadhia, Jigna M, RPH        Allergies as of 06/27/2022 - Review Complete 06/27/2022  Allergen Reaction Noted   Phenazopyridine Anaphylaxis 04/28/2022   Pyridium [phenazopyridine hcl] Other (See Comments) 07/27/2016   Sulfamethoxazole-trimethoprim Anaphylaxis 04/28/2022   Zosyn [piperacillin sod-tazobactam so] Shortness Of Breath 06/27/2022   Sulfa antibiotics  07/27/2016   Ciprofloxacin hcl  03/12/2015   Esomeprazole magnesium Nausea Only 02/09/2011   Ferrous sulfate Other (See Comments) 04/28/2022   Macrodantin [nitrofurantoin macrocrystal] Other (See Comments) 12/27/2014   Pantoprazole sodium  Nausea Only 02/09/2011   Promethazine Diarrhea 04/28/2022   Tramadol Nausea Only 02/15/2020    Family History  Problem Relation Age of Onset   Colon cancer Neg Hx     Social History   Socioeconomic History   Marital status: Widowed    Spouse name: Not on file   Number of children: 3   Years of education: Not on file   Highest education level: Not on file  Occupational History   Occupation: retired    Associate Professor: RETIRED  Tobacco Use   Smoking status: Never   Smokeless tobacco: Never  Substance and Sexual Activity   Alcohol use: No   Drug use: No   Sexual activity: Never  Other Topics Concern   Not on file  Social History Narrative   Not on file   Social Determinants of Health   Financial Resource Strain: Not on file  Food Insecurity: No Food Insecurity (06/28/2022)   Hunger Vital Sign    Worried About Running Out of Food in the Last Year: Never true    Ran Out of Food in the Last Year: Never true  Transportation Needs: No Transportation Needs (06/28/2022)   PRAPARE - Administrator, Civil Service (Medical): No    Lack of Transportation (Non-Medical): No  Physical Activity: Not on file  Stress: Not on file  Social Connections: Not on file  Intimate Partner Violence: Not At Risk (06/28/2022)   Humiliation, Afraid, Rape, and Kick questionnaire    Fear of Current or Ex-Partner: No    Emotionally Abused: No    Physically Abused: No    Sexually Abused: No    Review of Systems: Pertinent positive and negative review of systems were noted in the above HPI section.  All other review of systems was otherwise negative.   Physical Exam: Vital signs in last 24 hours: Temp:  [97.3 F (36.3 C)-101.6 F (38.7 C)] 97.9 F (36.6 C) (04/15 0813) Pulse Rate:  [63-89] 63 (04/15 0813) Resp:  [14-22] 18 (04/15 0813) BP: (127-167)/(53-71) 153/62 (04/15 0813) SpO2:  [92 %-100 %] 100 % (04/15 0813) Weight:  [47.6 kg] 47.6 kg (04/14 1515)   General:   Alert,   Well-developed, elderly Asian female well-nourished, pleasant and cooperative in NAD-interview was done with the virtual interpreter, patient's son joined towards the end of the visit Head:  Normocephalic and atraumatic. Eyes:  Sclera clear, no icterus.   Conjunctiva pink. Ears:  Normal auditory acuity. Nose:  No deformity,  discharge,  or lesions. Mouth:  No deformity or lesions.   Neck:  Supple; no masses or thyromegaly. Lungs:  Clear throughout to auscultation.   No wheezes, crackles, or rhonchi.  Heart:  Regular rate and rhythm; no murmurs, clicks, rubs,  or gallops. Abdomen:  Soft,nontender, BS active,nonpalp mass or hsm.   Rectal: Not done Msk:  Symmetrical without gross deformities. . Pulses:  Normal pulses noted. Extremities:  Without clubbing or edema. Neurologic:  Alert and  oriented x4;  grossly normal neurologically. Skin:  Intact without significant lesions or rashes.. Psych:  Alert and cooperative. Normal mood and affect.  Intake/Output from previous day: 04/14 0701 - 04/15 0700 In: 1818.8 [P.O.:360; I.V.:1171.9; IV Piggyback:286.9] Out: 1000 [Urine:1000] Intake/Output this shift: No intake/output data recorded.  Lab Results: Recent Labs    06/27/22 1608 06/28/22 0441  WBC 11.8* 8.8  HGB 9.8* 10.1*  HCT 28.5* 29.3*  PLT 234 233   BMET Recent Labs    06/27/22 1608 06/28/22 0441  NA 124* 129*  K 3.9 3.3*  CL 93* 98  CO2 22 20*  GLUCOSE 242* 293*  BUN 11 9  CREATININE 0.61 0.53  CALCIUM 8.1* 8.3*   LFT Recent Labs    06/28/22 0441  PROT 6.6  ALBUMIN 2.7*  AST 53*  ALT 95*  ALKPHOS 367*  BILITOT 0.6   PT/INR Recent Labs    06/27/22 2244  LABPROT 13.6  INR 1.0     IMPRESSION:  #22 82 year old non-English-speaking [place] female admitted with 3-day history of severe right upper quadrant pain, fever to 102 in the ER yesterday Elevated lactate  Met sepsis criteria-on IV Azactam/metronidazole, vancomycin Blood cultures pending  CT is  concerning for adenopathy versus soft tissue mass surrounding the porta hepatis causing biliary obstruction. Bilirubin remains normal  Patient is status post ERCP, sphincterotomy and duct clearing of sludge and mucopurulent debris in January 2024 when she was diagnosed with a ascending cholangitis. Subsequently underwent cholecystectomy on 03/31/2022, then course complicated by 2 small hepatic abscesses which were treated with 4 weeks of antibiotics. At last imaging on 05/17/2022 the smaller abscess had resolved and the larger abscess had reduced in size to 6 mm.  No evidence of abscesses on contrasted CT yesterday.  Etiology of current acute presentation is not entirely clear  #2 hyponatremia corrected #3 hypokalemia correcting For adult onset diabetes mellitus 5.  Hyperlipidemia 6.  Hypertension 7.  History of recurrent UTIs, does self cath at home  Plan; continue current IV antibiotics Await blood cultures Await MRI/MRCP scheduled for today Further GI recommendations pending results of MRCP.  (Have held a tentative spot for tomorrow afternoon 1:15 for ERCP)  Interview was done with the help of the mobile virtual interpreter, patient's son came into room during this interview, all of his questions were answered as well.    Amy EsterwoodPA-C  06/28/2022, 9:17 AM   GI ATTENDING  History, laboratories, x-rays, prior x-rays, prior endoscopy report all reviewed.  Patient seen and examined.  Agree with comprehensive consultation note as outlined above.  Atypical presentation.  Previously felt to have cholangitis with no significant intraductal abnormalities noted.  Now appears to have compressive process in the region of the porta hepatis with upstream dilation.  MRI from today as follows:   IMPRESSION: 1. New moderate diffuse intrahepatic biliary dilatation with abrupt transition point in the hepatic hilum. As suggested on recent CT, there is a possible enhancing mass in the porta  hepatis causing the biliary obstruction. Findings  remain suspicious for cholangiocarcinoma. Recommend ERCP with biliary stenting and brushing. 2. The previously demonstrated subcapsular abscesses in the right hepatic lobe have nearly resolved status post cholecystectomy. 3. No evidence of pancreatic ductal dilatation or pancreatic mass. 4. Trace ascites and trace bilateral pleural effusions. 5.  Aortic Atherosclerosis (ICD10-I70.0).  Clinically stable.  On antibiotics.  Plan for ERCP with brushings/stenting tomorrow.  Wilhemina Bonito. Eda Keys., M.D. [facility] Division of Gastroenterology

## 2022-06-29 ENCOUNTER — Inpatient Hospital Stay (HOSPITAL_COMMUNITY): Payer: Medicare Other

## 2022-06-29 ENCOUNTER — Inpatient Hospital Stay (HOSPITAL_COMMUNITY): Payer: Medicare Other | Admitting: Certified Registered Nurse Anesthetist

## 2022-06-29 ENCOUNTER — Encounter (HOSPITAL_COMMUNITY): Payer: Self-pay | Admitting: Internal Medicine

## 2022-06-29 ENCOUNTER — Encounter (HOSPITAL_COMMUNITY)
Admission: EM | Disposition: A | Discharge status: Home / Self Care | Payer: Self-pay | Source: Home / Self Care | Attending: Internal Medicine

## 2022-06-29 DIAGNOSIS — K831 Obstruction of bile duct: Secondary | ICD-10-CM

## 2022-06-29 DIAGNOSIS — I10 Essential (primary) hypertension: Secondary | ICD-10-CM

## 2022-06-29 DIAGNOSIS — E119 Type 2 diabetes mellitus without complications: Secondary | ICD-10-CM

## 2022-06-29 DIAGNOSIS — R7989 Other specified abnormal findings of blood chemistry: Secondary | ICD-10-CM | POA: Diagnosis not present

## 2022-06-29 DIAGNOSIS — E871 Hypo-osmolality and hyponatremia: Secondary | ICD-10-CM | POA: Diagnosis not present

## 2022-06-29 DIAGNOSIS — K839 Disease of biliary tract, unspecified: Secondary | ICD-10-CM | POA: Diagnosis not present

## 2022-06-29 DIAGNOSIS — Z7984 Long term (current) use of oral hypoglycemic drugs: Secondary | ICD-10-CM

## 2022-06-29 HISTORY — PX: BILIARY BRUSHING: SHX6843

## 2022-06-29 HISTORY — PX: BILIARY STENT PLACEMENT: SHX5538

## 2022-06-29 HISTORY — PX: ERCP: SHX5425

## 2022-06-29 LAB — COMPREHENSIVE METABOLIC PANEL
ALT: 64 U/L — ABNORMAL HIGH (ref 0–44)
AST: 32 U/L (ref 15–41)
Albumin: 2.3 g/dL — ABNORMAL LOW (ref 3.5–5.0)
Alkaline Phosphatase: 286 U/L — ABNORMAL HIGH (ref 38–126)
Anion gap: 8 (ref 5–15)
BUN: 10 mg/dL (ref 8–23)
CO2: 23 mmol/L (ref 22–32)
Calcium: 8.1 mg/dL — ABNORMAL LOW (ref 8.9–10.3)
Chloride: 104 mmol/L (ref 98–111)
Creatinine, Ser: 0.49 mg/dL (ref 0.44–1.00)
GFR, Estimated: >60 mL/min (ref >60)
Glucose, Bld: 117 mg/dL — ABNORMAL HIGH (ref 70–99)
Potassium: 3.3 mmol/L — ABNORMAL LOW (ref 3.5–5.1)
Sodium: 135 mmol/L (ref 135–145)
Total Bilirubin: 0.5 mg/dL (ref 0.3–1.2)
Total Protein: 5.8 g/dL — ABNORMAL LOW (ref 6.5–8.1)

## 2022-06-29 LAB — GLUCOSE, CAPILLARY
Glucose-Capillary: 104 mg/dL — ABNORMAL HIGH (ref 70–99)
Glucose-Capillary: 107 mg/dL — ABNORMAL HIGH (ref 70–99)
Glucose-Capillary: 108 mg/dL — ABNORMAL HIGH (ref 70–99)
Glucose-Capillary: 132 mg/dL — ABNORMAL HIGH (ref 70–99)
Glucose-Capillary: 192 mg/dL — ABNORMAL HIGH (ref 70–99)
Glucose-Capillary: 90 mg/dL (ref 70–99)

## 2022-06-29 LAB — CBC WITH DIFFERENTIAL/PLATELET
Abs Immature Granulocytes: 0.06 10*3/uL (ref 0.00–0.07)
Basophils Absolute: 0 10*3/uL (ref 0.0–0.1)
Basophils Relative: 0 %
Eosinophils Absolute: 0 10*3/uL (ref 0.0–0.5)
Eosinophils Relative: 0 %
HCT: 25.5 % — ABNORMAL LOW (ref 36.0–46.0)
Hemoglobin: 8.7 g/dL — ABNORMAL LOW (ref 12.0–15.0)
Immature Granulocytes: 1 %
Lymphocytes Relative: 19 %
Lymphs Abs: 2.3 10*3/uL (ref 0.7–4.0)
MCH: 30.1 pg (ref 26.0–34.0)
MCHC: 34.1 g/dL (ref 30.0–36.0)
MCV: 88.2 fL (ref 80.0–100.0)
Monocytes Absolute: 0.5 10*3/uL (ref 0.1–1.0)
Monocytes Relative: 4 %
Neutro Abs: 9.1 10*3/uL — ABNORMAL HIGH (ref 1.7–7.7)
Neutrophils Relative %: 76 %
Platelets: 226 10*3/uL (ref 150–400)
RBC: 2.89 MIL/uL — ABNORMAL LOW (ref 3.87–5.11)
RDW: 13.8 % (ref 11.5–15.5)
WBC: 12 10*3/uL — ABNORMAL HIGH (ref 4.0–10.5)
nRBC: 0 % (ref 0.0–0.2)

## 2022-06-29 LAB — PHOSPHORUS: Phosphorus: 2.3 mg/dL — ABNORMAL LOW (ref 2.5–4.6)

## 2022-06-29 LAB — MAGNESIUM: Magnesium: 1.7 mg/dL (ref 1.7–2.4)

## 2022-06-29 SURGERY — ERCP, WITH INTERVENTION IF INDICATED
Anesthesia: General

## 2022-06-29 MED ORDER — INDOMETHACIN 50 MG RE SUPP
RECTAL | Status: AC
  Filled 2022-06-29: qty 2

## 2022-06-29 MED ORDER — ONDANSETRON HCL 4 MG/2ML IJ SOLN
INTRAMUSCULAR | Status: DC | PRN
  Administered 2022-06-29: 4 mg via INTRAVENOUS

## 2022-06-29 MED ORDER — FENTANYL CITRATE (PF) 100 MCG/2ML IJ SOLN: 25.0000 ug | INTRAMUSCULAR | Status: DC | PRN

## 2022-06-29 MED ORDER — DICLOFENAC SUPPOSITORY 100 MG
RECTAL | Status: AC
  Filled 2022-06-29: qty 1

## 2022-06-29 MED ORDER — OXYCODONE HCL 5 MG PO TABS: 5.0000 mg | ORAL_TABLET | Freq: Once | ORAL | Status: DC | PRN

## 2022-06-29 MED ORDER — GLUCAGON HCL RDNA (DIAGNOSTIC) 1 MG IJ SOLR
INTRAMUSCULAR | Status: AC
  Filled 2022-06-29: qty 1

## 2022-06-29 MED ORDER — ROCURONIUM BROMIDE 10 MG/ML (PF) SYRINGE
PREFILLED_SYRINGE | INTRAVENOUS | Status: DC | PRN
  Administered 2022-06-29: 50 mg via INTRAVENOUS

## 2022-06-29 MED ORDER — HYDRALAZINE HCL 20 MG/ML IJ SOLN
INTRAMUSCULAR | Status: AC
  Filled 2022-06-29: qty 1

## 2022-06-29 MED ORDER — FENTANYL CITRATE (PF) 100 MCG/2ML IJ SOLN
INTRAMUSCULAR | Status: DC | PRN
  Administered 2022-06-29 (×2): 50 ug via INTRAVENOUS

## 2022-06-29 MED ORDER — PROPOFOL 10 MG/ML IV BOLUS
INTRAVENOUS | Status: DC | PRN
  Administered 2022-06-29: 100 mg via INTRAVENOUS

## 2022-06-29 MED ORDER — FENTANYL CITRATE (PF) 100 MCG/2ML IJ SOLN
INTRAMUSCULAR | Status: AC
  Filled 2022-06-29: qty 2

## 2022-06-29 MED ORDER — LACTATED RINGERS IV SOLN: INTRAVENOUS | Status: DC | PRN

## 2022-06-29 MED ORDER — INDOMETHACIN 50 MG RE SUPP
RECTAL | Status: DC | PRN
  Administered 2022-06-29: 100 mg via RECTAL

## 2022-06-29 MED ORDER — OXYCODONE HCL 5 MG/5ML PO SOLN: 5.0000 mg | Freq: Once | ORAL | Status: DC | PRN

## 2022-06-29 MED ORDER — ONDANSETRON HCL 4 MG/2ML IJ SOLN: 4.0000 mg | Freq: Once | INTRAMUSCULAR | Status: DC | PRN

## 2022-06-29 MED ORDER — DEXAMETHASONE SODIUM PHOSPHATE 10 MG/ML IJ SOLN
INTRAMUSCULAR | Status: DC | PRN
  Administered 2022-06-29: 10 mg via INTRAVENOUS

## 2022-06-29 MED ORDER — LACTATED RINGERS IV SOLN: Freq: Once | INTRAVENOUS | Status: AC

## 2022-06-29 MED ORDER — INDOMETHACIN 50 MG RE SUPP: 100.0000 mg | Freq: Once | RECTAL | Status: AC

## 2022-06-29 MED ORDER — PROPOFOL 10 MG/ML IV BOLUS
INTRAVENOUS | Status: AC
  Filled 2022-06-29: qty 20

## 2022-06-29 MED ORDER — LIDOCAINE 2% (20 MG/ML) 5 ML SYRINGE
INTRAMUSCULAR | Status: DC | PRN
  Administered 2022-06-29: 60 mg via INTRAVENOUS

## 2022-06-29 MED ORDER — HYDRALAZINE HCL 20 MG/ML IJ SOLN
10.0000 mg | Freq: Once | INTRAMUSCULAR | Status: AC
  Administered 2022-06-29: 10 mg via INTRAVENOUS

## 2022-06-29 MED ORDER — SUGAMMADEX SODIUM 500 MG/5ML IV SOLN
INTRAVENOUS | Status: DC | PRN
  Administered 2022-06-29: 100 mg via INTRAVENOUS
  Administered 2022-06-29: 200 mg via INTRAVENOUS

## 2022-06-29 MED ORDER — SODIUM CHLORIDE 0.9 % IV SOLN
INTRAVENOUS | Status: DC | PRN
  Administered 2022-06-29: 55 mL

## 2022-06-29 NOTE — Progress Notes (Signed)
PROGRESS NOTE  Lauren [name] DGL:[id number] DOB: 1940-03-19 DOA: 06/27/2022 PCP: Pearson Grippe, MD   LOS: 2 days   Brief Narrative / Interim history: 82 year old [place] speaking female with history of DM2, HLD, hypothyroidism, CKD 2, recurrent UTIs, chronic urinary obstruction requiring self-catheterization comes into the hospital with right upper quadrant pain.  This has been going on for the past 3 days, associated with nausea.  She apparently had cholecystectomy 3 months ago and her hospitalization has been complicated by liver abscesses, elevated LFTs.  She follows with ID as an outpatient and just finished antibiotics last month.  She was febrile to 101.6 in the ER but that was after she received Zosyn and had an apparent allergic reaction with shortness of breath, hypoxia, wheezing requiring Solu-Medrol  Subjective / 24h Interval events: Doing well this morning, iPad interpreter used for [place].  No abdominal pain, no nausea or vomiting  Assesement and Plan: Principal Problem:   Sepsis Active Problems:   HTN (hypertension)   Hyperlipidemia   Macrocytic anemia   LFT elevation   Hyponatremia   Allergic reaction   Hypomagnesemia   Fever  Principal problem Elevated LFTs, abdominal pain, nausea, vomiting -imaging in the ER with CT scan abdomen pelvis showed increased diffuse dilatation of intrahepatic bile ducts due to 2.5 cm soft tissue mass or lymphadenopathy in the porta hepatis.  There is concern for cholangiocarcinoma versus metastatic lymphadenopathy.  GI and general surgery were consulted -Has been placed on antibiotics, continue -MRI/MRCP confirmed intrahepatic biliary dilatation with abrupt transition point in the hepatic hilum.  She is planned for an ERCP with biliary stenting and brushing this afternoon.  Active problems Hyponatremia -likely in the setting of poor p.o. intake given hypochloremia as well.  Received fluids, sodium normalized this morning  Hypokalemia -continue  to replace potassium  Neurogenic bladder -continue self- catheterization.  She does have evidence of neurogenic bladder on the CT scan  Diverticulosis -incidentally noted  Anemia, of chronic disease -hemoglobin stable, no bleeding  History of hepatic abscess -noted in January, completed antibiotics, seen by ID most recently on 3/13 when her antibiotics were stopped.  Repeat imaging showed improvement of her abscesses.  ID following  Hyperlipidemia -continue statin   Allergic reaction to Zosyn -in the ER on 4/14, felt not to be true allergic reaction.  She has been placed on Unasyn by ID  Hypokalemia, hypomagnesemia -replace and continue to monitor  Essential hypertension -monitor blood pressure, if persists high will need resumption of her home Cozaar (50 mg)  DM2, with hyperglycemia -high CBGs likely in the setting of the steroid she got in the ER.  Hold home Janumet.  Continue sliding scale  CBG (last 3)  Recent Labs    06/29/22 0011 06/29/22 0410 06/29/22 0749  GLUCAP 90 104* 108*     Lab Results  Component Value Date   HGBA1C 6.3 (H) 04/01/2022    Scheduled Meds:  insulin aspart  0-9 Units Subcutaneous Q4H   levothyroxine  75 mcg Oral Daily   pravastatin  80 mg Oral Daily   Continuous Infusions:  ampicillin-sulbactam (UNASYN) IV 3 g (06/29/22 0830)   PRN Meds:.albuterol, diphenhydrAMINE, fentaNYL (SUBLIMAZE) injection, ondansetron **OR** ondansetron (ZOFRAN) IV  Current Outpatient Medications  Medication Instructions   acetaminophen (TYLENOL) 1,000 mg, Oral, Every 6 hours PRN   glucose blood (ACCU-CHEK AVIVA PLUS) test strip Use to check blood sugar 3 times daily.   levothyroxine (SYNTHROID) 75 mcg, Oral, Daily,     losartan (COZAAR) 50  mg, Oral, Daily,     meclizine (ANTIVERT) 25 mg, Oral, Every 12 hours PRN   Omega-3 Fatty Acids (FISH OIL PO) 1 tablet, Oral, Daily   omeprazole (PRILOSEC) 40 mg, Oral, Daily   pravastatin (PRAVACHOL) 80 mg, Oral, Daily    sitaGLIPtan-metformin (JANUMET) 50-500 MG per tablet 1 tablet, Oral, 2 times daily with meals,     VITAMIN D PO 1 tablet, Oral, Daily    Diet Orders (From admission, onward)     Start     Ordered   06/29/22 0001  Diet NPO time specified Except for: Sips with Meds  Diet effective 0500       Question:  Except for  Answer:  Sips with Meds   06/28/22 1459            DVT prophylaxis: SCDs Start: 06/27/22 2158   Lab Results  Component Value Date   PLT 226 06/29/2022      Code Status: Full Code  Family Communication: no family at bedside   Status is: Inpatient  Remains inpatient appropriate because: severity of illness  Level of care: Progressive  Consultants:  GI Surgery  Objective: Vitals:   06/28/22 0813 06/28/22 1232 06/28/22 2115 06/29/22 0720  BP: (!) 153/62 (!) 178/55 (!) 149/61 (!) 136/57  Pulse: 63 63 (!) 56 (!) 58  Resp: 18 20 20    Temp: 97.9 F (36.6 C) 98 F (36.7 C) 98.1 F (36.7 C) 98.3 F (36.8 C)  TempSrc: Oral Oral Oral Oral  SpO2: 100% 100% 99% 98%  Weight:      Height:        Intake/Output Summary (Last 24 hours) at 06/29/2022 1154 Last data filed at 06/29/2022 1610 Gross per 24 hour  Intake 1683.05 ml  Output --  Net 1683.05 ml    Wt Readings from Last 3 Encounters:  06/27/22 47.6 kg  05/26/22 46.3 kg  04/28/22 49 kg    Examination: Constitutional: NAD Eyes: lids and conjunctivae normal, no scleral icterus ENMT: mmm Neck: normal, supple Respiratory: clear to auscultation bilaterally, no wheezing, no crackles.  Cardiovascular: Regular rate and rhythm, no murmurs / rubs / gallops.  Abdomen: soft, no distention, no tenderness. Bowel sounds positive.  Skin: no rashes Neurologic: no focal deficits, equal strength  Data Reviewed: I have independently reviewed following labs and imaging studies   CBC Recent Labs  Lab 06/27/22 1608 06/28/22 0441 06/29/22 0415  WBC 11.8* 8.8 12.0*  HGB 9.8* 10.1* 8.7*  HCT 28.5* 29.3* 25.5*   PLT 234 233 226  MCV 86.9 87.2 88.2  MCH 29.9 30.1 30.1  MCHC 34.4 34.5 34.1  RDW 13.3 13.6 13.8  LYMPHSABS 1.3  --  2.3  MONOABS 0.4  --  0.5  EOSABS 0.0  --  0.0  BASOSABS 0.0  --  0.0     Recent Labs  Lab 06/27/22 1608 06/27/22 1716 06/27/22 1936 06/27/22 2244 06/28/22 0441 06/28/22 1254 06/29/22 0415  NA 124*  --   --   --  129*  --  135  K 3.9  --   --   --  3.3*  --  3.3*  CL 93*  --   --   --  98  --  104  CO2 22  --   --   --  20*  --  23  GLUCOSE 242*  --   --   --  293*  --  117*  BUN 11  --   --   --  9  --  10  CREATININE 0.61  --   --   --  0.53  --  0.49  CALCIUM 8.1*  --   --   --  8.3*  --  8.1*  AST 95*  --   --   --  53*  --  32  ALT 115*  --   --   --  95*  --  64*  ALKPHOS 396*  --   --   --  367*  --  286*  BILITOT 1.1  --   --   --  0.6  --  0.5  ALBUMIN 2.8*  --   --   --  2.7*  --  2.3*  MG  --   --   --  1.5* 2.1  --  1.7  CRP  --   --   --  12.9*  --   --   --   PROCALCITON  --   --   --  5.58  --   --   --   LATICACIDVEN  --  2.5* 1.2  --   --   --   --   INR  --   --   --  1.0  --  1.1  --   TSH  --   --   --  1.035  --   --   --      ------------------------------------------------------------------------------------------------------------------ No results for input(s): "CHOL", "HDL", "LDLCALC", "TRIG", "CHOLHDL", "LDLDIRECT" in the last 72 hours.  Lab Results  Component Value Date   HGBA1C 6.3 (H) 04/01/2022   ------------------------------------------------------------------------------------------------------------------ Recent Labs    06/27/22 2244  TSH 1.035     Cardiac Enzymes No results for input(s): "CKMB", "TROPONINI", "MYOGLOBIN" in the last 168 hours.  Invalid input(s): "CK" ------------------------------------------------------------------------------------------------------------------ No results found for: "BNP"  CBG: Recent Labs  Lab 06/28/22 1559 06/28/22 2009 06/29/22 0011 06/29/22 0410  06/29/22 0749  GLUCAP 183* 176* 90 104* 108*     Recent Results (from the past 240 hour(s))  SARS Coronavirus 2 by RT PCR (hospital order, performed in [facility] hospital lab) *cepheid single result test* Anterior Nasal Swab     Status: None   Collection Time: 06/27/22  4:29 PM   Specimen: Anterior Nasal Swab  Result Value Ref Range Status   SARS Coronavirus 2 by RT PCR NEGATIVE NEGATIVE Final    Comment: (NOTE) SARS-CoV-2 target nucleic acids are NOT DETECTED.  The SARS-CoV-2 RNA is generally detectable in upper and lower respiratory specimens during the acute phase of infection. The lowest concentration of SARS-CoV-2 viral copies this assay can detect is 250 copies / mL. A negative result does not preclude SARS-CoV-2 infection and should not be used as the sole basis for treatment or other patient management decisions.  A negative result may occur with improper specimen collection / handling, submission of specimen other than nasopharyngeal swab, presence of viral mutation(s) within the areas targeted by this assay, and inadequate number of viral copies (<250 copies / mL). A negative result must be combined with clinical observations, patient history, and epidemiological information.  Fact Sheet for Patients:   RoadLapTop.co.za  Fact Sheet for Healthcare Providers: http://Liberman-miller.com/  This test is not yet approved or  cleared by the [place] FDA and has been authorized for detection and/or diagnosis of SARS-CoV-2 by FDA under an Emergency Use Authorization (EUA).  This EUA will remain in effect (meaning this test can be used) for the duration of  the COVID-19 declaration under Section 564(b)(1) of the Act, 21 U.S.C. section 360bbb-3(b)(1), unless the authorization is terminated or revoked sooner.  Performed at [facility], [address] [place], [place] [address]   Blood culture (routine x 2)      Status: None (Preliminary result)   Collection Time: 06/27/22  4:45 PM   Specimen: BLOOD RIGHT FOREARM  Result Value Ref Range Status   Specimen Description   Final    BLOOD RIGHT FOREARM Performed at [facility], [address], [place], [place] [address]    Special Requests   Final    BOTTLES DRAWN AEROBIC ONLY Blood Culture adequate volume Performed at [facility], [address] [place], [place] [address]    Culture   Final    NO GROWTH 2 DAYS Performed at [facility], [address], [place], [place] [address]    Report Status PENDING  Incomplete  Blood culture (routine x 2)     Status: None (Preliminary result)   Collection Time: 06/27/22  4:51 PM   Specimen: BLOOD  Result Value Ref Range Status   Specimen Description   Final    BLOOD LEFT ANTECUBITAL Performed at [facility], [address], [place], [place] [address]    Special Requests   Final    BOTTLES DRAWN AEROBIC AND ANAEROBIC Blood Culture adequate volume Performed at [facility], [address], [place], [place] [address]    Culture   Final    NO GROWTH 2 DAYS Performed at [facility], [address], [place], [place] [address]    Report Status PENDING  Incomplete  Respiratory (~20 pathogens) panel by PCR     Status: None   Collection Time: 06/27/22  5:45 PM  Result Value Ref Range Status   Adenovirus NOT DETECTED NOT DETECTED Final   Coronavirus 229E NOT DETECTED NOT DETECTED Final    Comment: (NOTE) The Coronavirus on the Respiratory Panel, DOES NOT test for the novel  Coronavirus (2019 nCoV)    Coronavirus HKU1 NOT DETECTED NOT DETECTED Final   Coronavirus NL63 NOT DETECTED NOT DETECTED Final   Coronavirus OC43 NOT DETECTED NOT DETECTED Final   Metapneumovirus NOT DETECTED NOT DETECTED Final   Rhinovirus / Enterovirus NOT DETECTED NOT DETECTED Final   Influenza A NOT DETECTED NOT DETECTED Final   Influenza B NOT  DETECTED NOT DETECTED Final   Parainfluenza Virus 1 NOT DETECTED NOT DETECTED Final   Parainfluenza Virus 2 NOT DETECTED NOT DETECTED Final   Parainfluenza Virus 3 NOT DETECTED NOT DETECTED Final   Parainfluenza Virus 4 NOT DETECTED NOT DETECTED Final   Respiratory Syncytial Virus NOT DETECTED NOT DETECTED Final   Bordetella pertussis NOT DETECTED NOT DETECTED Final   Bordetella Parapertussis NOT DETECTED NOT DETECTED Final   Chlamydophila pneumoniae NOT DETECTED NOT DETECTED Final   Mycoplasma pneumoniae NOT DETECTED NOT DETECTED Final    Comment: Performed at [facility], [address] [place], [place] [address]     Radiology Studies: MR ABDOMEN MRCP W WO CONTAST  Result Date: 06/28/2022 CLINICAL DATA:  Abdominal pain with fever and leukocytosis status post laparoscopic partial cholecystectomy 03/31/2022 complicated by hepatic abscesses. Concern for biliary obstruction on recent CT. EXAM: MRI ABDOMEN WITHOUT AND WITH CONTRAST (INCLUDING MRCP) TECHNIQUE: Multiplanar multisequence MR imaging of the abdomen was performed both before and after the administration of intravenous contrast. Heavily T2-weighted images of the biliary and  pancreatic ducts were obtained, and three-dimensional MRCP images were rendered by post processing. CONTRAST:  4.40mL GADAVIST GADOBUTROL 1 MMOL/ML IV SOLN COMPARISON:  Abdominopelvic CT 06/27/2022. Abdominal MRI 04/05/2022. FINDINGS: Despite efforts by the technologist and patient, mild motion artifact is present on today's exam and could not be eliminated. This reduces exam sensitivity and specificity. Lower chest:  Trace bilateral pleural effusions. Hepatobiliary: No significant hepatic steatosis or morphologic changes of cirrhosis are demonstrated. The previously demonstrated subcapsular abscesses in the right hepatic lobe have nearly resolved status post cholecystectomy. As seen on recent CT, there is new moderate diffuse intrahepatic biliary dilatation with  abrupt transition point in the hepatic hilum. As suggested on CT, the there is a possible enhancing mass in the porta hepatis causing the biliary obstruction, measuring approximately 1.5 x 1.3 cm on image 31/19. The common bile duct appears decompressed. Pancreas: No pancreatic ductal dilatation, mass lesion or surrounding inflammation. Spleen: Normal in size without focal abnormality. Adrenals/Urinary Tract: Both adrenal glands appear normal. The kidneys appear unremarkable, without evidence of hydronephrosis or perinephric soft tissue stranding. Stomach/Bowel: The stomach appears unremarkable for its degree of distension. No evidence of bowel wall thickening, distention or surrounding inflammatory change. Vascular/Lymphatic: Aside from the enhancing lesion in the porta hepatis, no enlarged abdominal lymph nodes are identified. Diffuse aortic and branch vessel atherosclerosis, better seen on CT. No evidence of aneurysm or large vessel occlusion. The portal, superior mesenteric and splenic veins are patent. Other: Trace ascites.  No evidence of peritoneal nodularity. Musculoskeletal: No acute or significant osseous findings. IMPRESSION: 1. New moderate diffuse intrahepatic biliary dilatation with abrupt transition point in the hepatic hilum. As suggested on recent CT, there is a possible enhancing mass in the porta hepatis causing the biliary obstruction. Findings remain suspicious for cholangiocarcinoma. Recommend ERCP with biliary stenting and brushing. 2. The previously demonstrated subcapsular abscesses in the right hepatic lobe have nearly resolved status post cholecystectomy. 3. No evidence of pancreatic ductal dilatation or pancreatic mass. 4. Trace ascites and trace bilateral pleural effusions. 5.  Aortic Atherosclerosis (ICD10-I70.0). Electronically Signed   By: Carey Bullocks M.D.   On: 06/28/2022 12:43   MR 3D Recon At Scanner  Result Date: 06/28/2022 CLINICAL DATA:  Abdominal pain with fever and  leukocytosis status post laparoscopic partial cholecystectomy 03/31/2022 complicated by hepatic abscesses. Concern for biliary obstruction on recent CT. EXAM: MRI ABDOMEN WITHOUT AND WITH CONTRAST (INCLUDING MRCP) TECHNIQUE: Multiplanar multisequence MR imaging of the abdomen was performed both before and after the administration of intravenous contrast. Heavily T2-weighted images of the biliary and pancreatic ducts were obtained, and three-dimensional MRCP images were rendered by post processing. CONTRAST:  4.9mL GADAVIST GADOBUTROL 1 MMOL/ML IV SOLN COMPARISON:  Abdominopelvic CT 06/27/2022. Abdominal MRI 04/05/2022. FINDINGS: Despite efforts by the technologist and patient, mild motion artifact is present on today's exam and could not be eliminated. This reduces exam sensitivity and specificity. Lower chest:  Trace bilateral pleural effusions. Hepatobiliary: No significant hepatic steatosis or morphologic changes of cirrhosis are demonstrated. The previously demonstrated subcapsular abscesses in the right hepatic lobe have nearly resolved status post cholecystectomy. As seen on recent CT, there is new moderate diffuse intrahepatic biliary dilatation with abrupt transition point in the hepatic hilum. As suggested on CT, the there is a possible enhancing mass in the porta hepatis causing the biliary obstruction, measuring approximately 1.5 x 1.3 cm on image 31/19. The common bile duct appears decompressed. Pancreas: No pancreatic ductal dilatation, mass lesion or surrounding inflammation. Spleen: Normal in  size without focal abnormality. Adrenals/Urinary Tract: Both adrenal glands appear normal. The kidneys appear unremarkable, without evidence of hydronephrosis or perinephric soft tissue stranding. Stomach/Bowel: The stomach appears unremarkable for its degree of distension. No evidence of bowel wall thickening, distention or surrounding inflammatory change. Vascular/Lymphatic: Aside from the enhancing lesion in  the porta hepatis, no enlarged abdominal lymph nodes are identified. Diffuse aortic and branch vessel atherosclerosis, better seen on CT. No evidence of aneurysm or large vessel occlusion. The portal, superior mesenteric and splenic veins are patent. Other: Trace ascites.  No evidence of peritoneal nodularity. Musculoskeletal: No acute or significant osseous findings. IMPRESSION: 1. New moderate diffuse intrahepatic biliary dilatation with abrupt transition point in the hepatic hilum. As suggested on recent CT, there is a possible enhancing mass in the porta hepatis causing the biliary obstruction. Findings remain suspicious for cholangiocarcinoma. Recommend ERCP with biliary stenting and brushing. 2. The previously demonstrated subcapsular abscesses in the right hepatic lobe have nearly resolved status post cholecystectomy. 3. No evidence of pancreatic ductal dilatation or pancreatic mass. 4. Trace ascites and trace bilateral pleural effusions. 5.  Aortic Atherosclerosis (ICD10-I70.0). Electronically Signed   By: Carey Bullocks M.D.   On: 06/28/2022 12:43     Pamella Pert, MD, PhD [facility]  Between 7 am - 7 pm I am available, please contact me via Amion (for emergencies) or Securechat (non urgent messages)  Between 7 pm - 7 am I am not available, please contact night coverage MD/APP via Amion

## 2022-06-29 NOTE — Anesthesia Preprocedure Evaluation (Addendum)
Anesthesia Evaluation  Patient identified by MRN, date of birth, ID band Patient awake    Reviewed: Allergy & Precautions, NPO status , Patient's Chart, lab work & pertinent test results  History of Anesthesia Complications Negative for: history of anesthetic complications  Airway Mallampati: III  TM Distance: >3 FB Neck ROM: Full    Dental  (+) Dental Advisory Given   Pulmonary neg pulmonary ROS   Pulmonary exam normal        Cardiovascular hypertension, Pt. on medications Normal cardiovascular exam     Neuro/Psych  Hearing loss Tinnitus   negative psych ROS   GI/Hepatic Neg liver ROS, hiatal hernia,GERD  Medicated and Controlled,, Fecal incontinence IBS    Endo/Other  diabetes, Type 2, Oral Hypoglycemic AgentsHypothyroidism    Renal/GU negative Renal ROS     Musculoskeletal  (+) Arthritis ,    Abdominal   Peds  Hematology  (+) Blood dyscrasia, anemia  Hemolytic drug reaction    Anesthesia Other Findings   Reproductive/Obstetrics                             Anesthesia Physical Anesthesia Plan  ASA: 3  Anesthesia Plan: General   Post-op Pain Management: Minimal or no pain anticipated   Induction: Intravenous  PONV Risk Score and Plan: 3 and Treatment may vary due to age or medical condition, Ondansetron and Propofol infusion  Airway Management Planned: Oral ETT  Additional Equipment: None  Intra-op Plan:   Post-operative Plan: Extubation in OR  Informed Consent: I have reviewed the patients History and Physical, chart, labs and discussed the procedure including the risks, benefits and alternatives for the proposed anesthesia with the patient or authorized representative who has indicated his/her understanding and acceptance.     Dental advisory given and Interpreter used for interveiw  Plan Discussed with: CRNA and Anesthesiologist  Anesthesia Plan Comments:          Anesthesia Quick Evaluation

## 2022-06-29 NOTE — Anesthesia Postprocedure Evaluation (Signed)
Anesthesia Post Note  Patient: Lauren [name]  Procedure(s) Performed: ENDOSCOPIC RETROGRADE CHOLANGIOPANCREATOGRAPHY (ERCP) BILIARY BRUSHING BILIARY STENT PLACEMENT     Patient location during evaluation: PACU Anesthesia Type: General Level of consciousness: awake and alert Pain management: pain level controlled Vital Signs Assessment: post-procedure vital signs reviewed and stable Respiratory status: spontaneous breathing, nonlabored ventilation, respiratory function stable and patient connected to nasal cannula oxygen Cardiovascular status: blood pressure returned to baseline and stable Postop Assessment: no apparent nausea or vomiting Anesthetic complications: no  No notable events documented.  Last Vitals:  Vitals:   06/29/22 1620 06/29/22 1649  BP:  (!) 106/43  Pulse: 86 86  Resp: 15 18  Temp:  (!) 36.4 C  SpO2: 98% 100%    Last Pain:  Vitals:   06/29/22 1649  TempSrc: Oral  PainSc: 0-No pain                 Luxe Cuadros L Zarianna Dicarlo

## 2022-06-29 NOTE — Progress Notes (Addendum)
Patient ID: Lauren [name], female   DOB: 1940-08-08, 82 y.o.   MRN: [id number]    Progress Note   Subjective  Day #2  CC; acute right upper quadrant pain x 3 days, fever, concern for biliary obstruction on CT  IV Azactam/metronidazole/vancomycin  Blood cultures no growth thus far Today-WBC 12.0/hemoglobin 8.7/hematocrit 25.5 Potassium 3.3/creatinine 0.49 T. bili 0.5/alk phos 286/AST 32/ALT 64 stable  MRI/MRCP yesterday-no significant hepatic steatosis or cirrhosis previously demonstrated subcapsular abscesses had nearly resolved, new moderate diffuse intrahepatic biliary dilation with abrupt transition in the hepatic hilum possible enhancing mass in the porta hepatis causing biliary obstruction measuring 1.5 x 1.3 cm, trace ascites and bilateral pleural effusions  Discussion with the patient this morning via virtual interpreter-patient denies any abdominal pain or discomfort, no nausea or vomiting, she is hungry.  Afebrile overnight     Vital signs in last 24 hours: Temp:  [98 F (36.7 C)-98.3 F (36.8 C)] 98.3 F (36.8 C) (04/16 0720) Pulse Rate:  [56-63] 58 (04/16 0720) Resp:  [20] 20 (04/15 2115) BP: (136-178)/(55-61) 136/57 (04/16 0720) SpO2:  [98 %-100 %] 98 % (04/16 0720) Last BM Date : 06/27/22 General:    Elderly Asian female in NAD Heart:  Regular rate and rhythm; no murmurs Lungs: Respirations even and unlabored, decrease in breath sounds bilateral bases Abdomen:  Soft, nontender and nondistended. Normal bowel sounds. Extremities:  Without edema. Neurologic:  Alert and oriented,  grossly normal neurologically. Psych:  Cooperative. Normal mood and affect.  Intake/Output from previous day: 04/15 0701 - 04/16 0700 In: 1683.1 [P.O.:750; I.V.:732.3; IV Piggyback:200.7] Out: -  Intake/Output this shift: No intake/output data recorded.  Lab Results: Recent Labs    06/27/22 1608 06/28/22 0441 06/29/22 0415  WBC 11.8* 8.8 12.0*  HGB 9.8* 10.1* 8.7*  HCT 28.5*  29.3* 25.5*  PLT 234 233 226   BMET Recent Labs    06/27/22 1608 06/28/22 0441 06/29/22 0415  NA 124* 129* 135  K 3.9 3.3* 3.3*  CL 93* 98 104  CO2 22 20* 23  GLUCOSE 242* 293* 117*  BUN 11 9 10   CREATININE 0.61 0.53 0.49  CALCIUM 8.1* 8.3* 8.1*   LFT Recent Labs    06/29/22 0415  PROT 5.8*  ALBUMIN 2.3*  AST 32  ALT 64*  ALKPHOS 286*  BILITOT 0.5   PT/INR Recent Labs    06/27/22 2244 06/28/22 1254  LABPROT 13.6 13.8  INR 1.0 1.1    Studies/Results: MR ABDOMEN MRCP W WO CONTAST  Result Date: 06/28/2022 CLINICAL DATA:  Abdominal pain with fever and leukocytosis status post laparoscopic partial cholecystectomy 03/31/2022 complicated by hepatic abscesses. Concern for biliary obstruction on recent CT. EXAM: MRI ABDOMEN WITHOUT AND WITH CONTRAST (INCLUDING MRCP) TECHNIQUE: Multiplanar multisequence MR imaging of the abdomen was performed both before and after the administration of intravenous contrast. Heavily T2-weighted images of the biliary and pancreatic ducts were obtained, and three-dimensional MRCP images were rendered by post processing. CONTRAST:  4.57mL GADAVIST GADOBUTROL 1 MMOL/ML IV SOLN COMPARISON:  Abdominopelvic CT 06/27/2022. Abdominal MRI 04/05/2022. FINDINGS: Despite efforts by the technologist and patient, mild motion artifact is present on today's exam and could not be eliminated. This reduces exam sensitivity and specificity. Lower chest:  Trace bilateral pleural effusions. Hepatobiliary: No significant hepatic steatosis or morphologic changes of cirrhosis are demonstrated. The previously demonstrated subcapsular abscesses in the right hepatic lobe have nearly resolved status post cholecystectomy. As seen on recent CT, there is new moderate diffuse intrahepatic biliary  dilatation with abrupt transition point in the hepatic hilum. As suggested on CT, the there is a possible enhancing mass in the porta hepatis causing the biliary obstruction, measuring  approximately 1.5 x 1.3 cm on image 31/19. The common bile duct appears decompressed. Pancreas: No pancreatic ductal dilatation, mass lesion or surrounding inflammation. Spleen: Normal in size without focal abnormality. Adrenals/Urinary Tract: Both adrenal glands appear normal. The kidneys appear unremarkable, without evidence of hydronephrosis or perinephric soft tissue stranding. Stomach/Bowel: The stomach appears unremarkable for its degree of distension. No evidence of bowel wall thickening, distention or surrounding inflammatory change. Vascular/Lymphatic: Aside from the enhancing lesion in the porta hepatis, no enlarged abdominal lymph nodes are identified. Diffuse aortic and branch vessel atherosclerosis, better seen on CT. No evidence of aneurysm or large vessel occlusion. The portal, superior mesenteric and splenic veins are patent. Other: Trace ascites.  No evidence of peritoneal nodularity. Musculoskeletal: No acute or significant osseous findings. IMPRESSION: 1. New moderate diffuse intrahepatic biliary dilatation with abrupt transition point in the hepatic hilum. As suggested on recent CT, there is a possible enhancing mass in the porta hepatis causing the biliary obstruction. Findings remain suspicious for cholangiocarcinoma. Recommend ERCP with biliary stenting and brushing. 2. The previously demonstrated subcapsular abscesses in the right hepatic lobe have nearly resolved status post cholecystectomy. 3. No evidence of pancreatic ductal dilatation or pancreatic mass. 4. Trace ascites and trace bilateral pleural effusions. 5.  Aortic Atherosclerosis (ICD10-I70.0). Electronically Signed   By: Carey Bullocks M.D.   On: 06/28/2022 12:43   MR 3D Recon At Scanner  Result Date: 06/28/2022 CLINICAL DATA:  Abdominal pain with fever and leukocytosis status post laparoscopic partial cholecystectomy 03/31/2022 complicated by hepatic abscesses. Concern for biliary obstruction on recent CT. EXAM: MRI ABDOMEN  WITHOUT AND WITH CONTRAST (INCLUDING MRCP) TECHNIQUE: Multiplanar multisequence MR imaging of the abdomen was performed both before and after the administration of intravenous contrast. Heavily T2-weighted images of the biliary and pancreatic ducts were obtained, and three-dimensional MRCP images were rendered by post processing. CONTRAST:  4.62mL GADAVIST GADOBUTROL 1 MMOL/ML IV SOLN COMPARISON:  Abdominopelvic CT 06/27/2022. Abdominal MRI 04/05/2022. FINDINGS: Despite efforts by the technologist and patient, mild motion artifact is present on today's exam and could not be eliminated. This reduces exam sensitivity and specificity. Lower chest:  Trace bilateral pleural effusions. Hepatobiliary: No significant hepatic steatosis or morphologic changes of cirrhosis are demonstrated. The previously demonstrated subcapsular abscesses in the right hepatic lobe have nearly resolved status post cholecystectomy. As seen on recent CT, there is new moderate diffuse intrahepatic biliary dilatation with abrupt transition point in the hepatic hilum. As suggested on CT, the there is a possible enhancing mass in the porta hepatis causing the biliary obstruction, measuring approximately 1.5 x 1.3 cm on image 31/19. The common bile duct appears decompressed. Pancreas: No pancreatic ductal dilatation, mass lesion or surrounding inflammation. Spleen: Normal in size without focal abnormality. Adrenals/Urinary Tract: Both adrenal glands appear normal. The kidneys appear unremarkable, without evidence of hydronephrosis or perinephric soft tissue stranding. Stomach/Bowel: The stomach appears unremarkable for its degree of distension. No evidence of bowel wall thickening, distention or surrounding inflammatory change. Vascular/Lymphatic: Aside from the enhancing lesion in the porta hepatis, no enlarged abdominal lymph nodes are identified. Diffuse aortic and branch vessel atherosclerosis, better seen on CT. No evidence of aneurysm or large  vessel occlusion. The portal, superior mesenteric and splenic veins are patent. Other: Trace ascites.  No evidence of peritoneal nodularity. Musculoskeletal: No acute or significant  osseous findings. IMPRESSION: 1. New moderate diffuse intrahepatic biliary dilatation with abrupt transition point in the hepatic hilum. As suggested on recent CT, there is a possible enhancing mass in the porta hepatis causing the biliary obstruction. Findings remain suspicious for cholangiocarcinoma. Recommend ERCP with biliary stenting and brushing. 2. The previously demonstrated subcapsular abscesses in the right hepatic lobe have nearly resolved status post cholecystectomy. 3. No evidence of pancreatic ductal dilatation or pancreatic mass. 4. Trace ascites and trace bilateral pleural effusions. 5.  Aortic Atherosclerosis (ICD10-I70.0). Electronically Signed   By: Carey Bullocks M.D.   On: 06/28/2022 12:43   CT ABDOMEN PELVIS W CONTRAST  Result Date: 06/27/2022 CLINICAL DATA:  Abdominal pain for 3 days. Leukocytosis. 3 months postop from cholecystectomy. EXAM: CT ABDOMEN AND PELVIS WITH CONTRAST TECHNIQUE: Multidetector CT imaging of the abdomen and pelvis was performed using the standard protocol following bolus administration of intravenous contrast. RADIATION DOSE REDUCTION: This exam was performed according to the departmental dose-optimization program which includes automated exposure control, adjustment of the mA and/or kV according to patient size and/or use of iterative reconstruction technique. CONTRAST:  75mL OMNIPAQUE IOHEXOL 350 MG/ML SOLN COMPARISON:  05/17/2022 FINDINGS: Lower Chest: No acute findings. Hepatobiliary: No hepatic masses identified. Increased diffuse dilatation of intrahepatic bile ducts is seen. Increased soft tissue mass or lymphadenopathy seen in the porta hepatis measuring 2.1 x 1.8 cm, which appears to obstruct the common bile duct. Pancreas: No mass or inflammatory changes. No evidence of  pancreatic ductal dilatation. Spleen: Within normal limits in size and appearance. Adrenals/Urinary Tract: No suspicious masses identified. No evidence of ureteral calculi or hydronephrosis. Stable distended urinary bladder with mild diffuse wall thickening. Stomach/Bowel: No evidence of obstruction, inflammatory process or abnormal fluid collections. Normal appendix visualized. Mild diverticulosis seen involving the ascending colon, without evidence of diverticulitis. Vascular/Lymphatic: Except for enlarged lymph node or mass in the porta hepatis described above, no other sites of lymphadenopathy identified. No acute vascular findings. Aortic atherosclerotic calcification incidentally noted. Reproductive:  No mass or other significant abnormality. Other:  None. Musculoskeletal:  No suspicious bone lesions identified. IMPRESSION: Increased diffuse dilatation of intrahepatic bile ducts due to 2.1 cm soft tissue mass or lymphadenopathy in the porta hepatis. Differential diagnosis includes cholangiocarcinoma and metastatic lymphadenopathy. Consider ERCP for further evaluation. Stable diffuse bladder wall thickening wall thickening, which may indicate chronic cystitis or neurogenic bladder. Right colonic diverticulosis, without radiographic evidence of diverticulitis. Aortic Atherosclerosis (ICD10-I70.0). Electronically Signed   By: Danae Orleans M.D.   On: 06/27/2022 19:16   CT Angio Chest PE W and/or Wo Contrast  Result Date: 06/27/2022 CLINICAL DATA:  Chest pain in dyspnea for several days. High probability for pulmonary embolism. EXAM: CT ANGIOGRAPHY CHEST WITH CONTRAST TECHNIQUE: Multidetector CT imaging of the chest was performed using the standard protocol during bolus administration of intravenous contrast. Multiplanar CT image reconstructions and MIPs were obtained to evaluate the vascular anatomy. RADIATION DOSE REDUCTION: This exam was performed according to the departmental dose-optimization program which  includes automated exposure control, adjustment of the mA and/or kV according to patient size and/or use of iterative reconstruction technique. CONTRAST:  75mL OMNIPAQUE IOHEXOL 350 MG/ML SOLN COMPARISON:  01/26/2023 FINDINGS: Cardiovascular: Satisfactory opacification of pulmonary arteries noted, and no pulmonary emboli identified. No evidence of thoracic aortic dissection or aneurysm. Aortic and coronary atherosclerotic calcification incidentally noted. Mediastinum/Nodes: No masses or pathologically enlarged lymph nodes identified. Calcified mediastinal lymph nodes are consistent with old granulomatous disease. Lungs/Pleura: No pulmonary mass, infiltrate, or effusion.  Upper abdomen: No acute findings. Musculoskeletal: No suspicious bone lesions identified. Review of the MIP images confirms the above findings. IMPRESSION: No evidence of pulmonary embolism or other active disease within the thorax. Aortic Atherosclerosis (ICD10-I70.0). Electronically Signed   By: Danae Orleans M.D.   On: 06/27/2022 19:05   DG Chest Port 1 View  Result Date: 06/27/2022 CLINICAL DATA:  Sudden onset of shortness of breath. EXAM: PORTABLE CHEST 1 VIEW COMPARISON:  Radiograph earlier today, 1 hour ago FINDINGS: The cardiomediastinal contours are normal. Unchanged blunting of left costophrenic angle. No developing airspace disease, pulmonary edema or pneumothorax. No acute osseous abnormalities are seen. IMPRESSION: Stable radiographic appearance of the chest, no interval change. Electronically Signed   By: Narda Rutherford M.D.   On: 06/27/2022 18:03   DG Chest Port 1 View  Result Date: 06/27/2022 CLINICAL DATA:  RIGHT upper quadrant pain. EXAM: PORTABLE CHEST 1 VIEW COMPARISON:  Chest x-ray dated 04/04/2022 FINDINGS: Lungs are now clear. No pleural effusion or pneumothorax is seen. Heart size and mediastinal contours are within normal limits. Osseous structures about the chest are unremarkable. IMPRESSION: No active disease.  Lungs are now clear. Electronically Signed   By: Bary Richard M.D.   On: 06/27/2022 16:52       Assessment / Plan:    #36 82 year old non-English-speaking [place] female who was admitted with 3-day history of severe right upper quadrant pain, fever to 102 in the ER and admitted and elevated lactate  Met sepsis criteria-covering with IV Azactam/metronidazole/vancomycin  MRI/MRCP yesterday concerning for possible cholangiocarcinoma with new moderate diffuse intrahepatic biliary dilation with abrupt transition point at the hepatic hilum and suspicion for enhancing mass in the porta hepatis. Previously demonstrated subcapsular abscesses have nearly resolved postcholecystectomy no pancreatitis  Patient has been stable, abdominal pain has resolved  #2 hypokalemia correcting #3 anemia macrocytic #4 neurogenic bladder does self cath #5 adult onset diabetes mellitus #6.  Hypertension #7.  History of small hepatic abscesses-per MRI/MRCP nearly resolved.  Patient was evaluated by ID yesterday-for now continuing on current antibiotic regimen-had expressed some sensation of shortness of breath while on Augmentin long-term  Plan; keep n.p.o. And is scheduled for ERCP with brushings and probable stent placement per Dr. Marina Goodell this afternoon I had spoken with her son by phone yesterday afternoon and reviewed ERCP indications risks and benefits in detail with son. I also spoke with the patient today via the interpreter and we discussed ERCP in detail,/indications risks and benefits and she is agreeable to proceed.  Further recommendations pending findings at ERCP    Principal Problem:   Sepsis Active Problems:   HTN (hypertension)   Hyperlipidemia   Macrocytic anemia   LFT elevation   Hyponatremia   Allergic reaction   Hypomagnesemia   Fever     LOS: 2 days   Amy Esterwood  PA-C 06/29/2022, 8:34 AM  GI ATTENDING  Interval history data reviewed.  Patient seen and examined in the  endoscopy suite preprocedure.  It was seen that her biliary obstruction and abnormalities on imaging somehow related to her complicated gallbladder surgery with incomplete cholecystectomy.  Infectious issues.  Wonder about Mirizzi type picture.  Now for ERCP with stenting.The nature of the procedure, as well as the risks, benefits, and alternatives were carefully and thoroughly reviewed with the patient. Ample time for discussion and questions allowed. The patient understood, was satisfied, and agreed to proceed.  Wilhemina Bonito. Eda Keys., M.D. [facility] Division of Gastroenterology

## 2022-06-29 NOTE — Transfer of Care (Signed)
Immediate Anesthesia Transfer of Care Note  Patient: Lauren [name]  Procedure(s) Performed: ENDOSCOPIC RETROGRADE CHOLANGIOPANCREATOGRAPHY (ERCP) BILIARY BRUSHING BILIARY STENT PLACEMENT  Patient Location: PACU  Anesthesia Type:General  Level of Consciousness: awake  Airway & Oxygen Therapy: Patient Spontanous Breathing and Patient connected to face mask oxygen  Post-op Assessment: Report given to RN and Post -op Vital signs reviewed and stable  Post vital signs: Reviewed and stable  Last Vitals:  Vitals Value Taken Time  BP 198/88 06/29/22 1550  Temp    Pulse 68 06/29/22 1551  Resp 13 06/29/22 1551  SpO2 97 % 06/29/22 1551  Vitals shown include unvalidated device data.  Last Pain:  Vitals:   06/29/22 1253  TempSrc: Temporal  PainSc: 0-No pain         Complications: No notable events documented.

## 2022-06-29 NOTE — Anesthesia Procedure Notes (Signed)
Date/Time: 06/29/2022 3:38 PM  Performed by: Minerva Ends, CRNAOxygen Delivery Method: Simple face mask Placement Confirmation: positive ETCO2 and breath sounds checked- equal and bilateral Dental Injury: Teeth and Oropharynx as per pre-operative assessment  Comments: Teeth as preop -- chipped front-- unchanged

## 2022-06-29 NOTE — Anesthesia Procedure Notes (Signed)
Procedure Name: Intubation Date/Time: 06/29/2022 2:21 PM  Performed by: Basilio Cairo, CRNAPre-anesthesia Checklist: Patient identified, Patient being monitored, Timeout performed, Emergency Drugs available and Suction available Patient Re-evaluated:Patient Re-evaluated prior to induction Oxygen Delivery Method: Circle system utilized Preoxygenation: Pre-oxygenation with 100% oxygen Induction Type: IV induction Ventilation: Mask ventilation without difficulty Laryngoscope Size: Mac and 3 Grade View: Grade I Tube type: Oral Tube size: 7.0 mm Number of attempts: 1 Airway Equipment and Method: Stylet Placement Confirmation: ETT inserted through vocal cords under direct vision, positive ETCO2 and breath sounds checked- equal and bilateral Secured at: 21 cm Tube secured with: Tape Dental Injury: Teeth and Oropharynx as per pre-operative assessment

## 2022-06-29 NOTE — Op Note (Signed)
[facility] Patient Name: Lauren [name] Procedure Date: 06/29/2022 MRN: [id number] Attending MD: Wilhemina Bonito. Marina Goodell , MD, [id number] Date of Birth: 04/11/1940 CSN: [id number] Age: 82 Admit Type: Outpatient Procedure:                ERCP with bile duct brushings; with biliary stent                            placement. 10 Fr./7 cm Indications:              Common bile duct stricture, Abdominal pain of                            suspected biliary origin, Biliary dilation on                            magnetic resonance cholangiopancreatography,                            Elevated liver enzymes. Prior ERCP followed by                            subtotal cholecystectomy January 2024 Providers:                Wilhemina Bonito. Marina Goodell, MD, Lorenza Evangelist, RN, Vicki Mallet, RN, Joannie Springs, Technician, Irene Shipper, Technician Referring MD:             [facility] hospitalist Medicines:                General Anesthesia Complications:            No immediate complications. Estimated Blood Loss:     Estimated blood loss: none. Procedure:                Pre-Anesthesia Assessment:                           - Prior to the procedure, a History and Physical                            was performed, and patient medications and                            allergies were reviewed. The patient's tolerance of                            previous anesthesia was also reviewed. The risks                            and benefits of the procedure and the sedation                            options and  risks were discussed with the patient.                            All questions were answered, and informed consent                            was obtained. Prior Anticoagulants: The patient has                            taken no anticoagulant or antiplatelet agents. ASA                            Grade Assessment: II - A patient with mild systemic                             disease. After reviewing the risks and benefits,                            the patient was deemed in satisfactory condition to                            undergo the procedure.                           After obtaining informed consent, the scope was                            passed under direct vision. Throughout the                            procedure, the patient's blood pressure, pulse, and                            oxygen saturations were monitored continuously. The                            [id number]-Q190V ([id number]) Olympus duodenoscope was                            introduced through the mouth, and used to inject                            contrast into and used to inject contrast into the                            bile duct. The ERCP was accomplished without                            difficulty. The patient tolerated the procedure                            well. Scope In: Scope Out: Findings:      1. The side-viewing duodena scope was passed blindly into the esophagus.  The stomach and duodenum were grossly normal. The major ampulla was       unremarkable. Pressure enterotomy had been performed. The minor ampulla       was not sought.      2. A scout radiograph of the abdomen with the endoscope and position was       unremarkable      3. The common bile duct was freely cannulated on the first attempt.       Injection of contrast yielded a 2 cm in length shelflike stricture of       the common hepatic duct. No filling defects.      4. Blood was emanating from the ampulla along with bile.      5. The stricture was brushed multiple times and submitted for cytologic       analysis.      6. A 10 French 7 cm plastic stent was placed into the right hepatic duct       in good position.      7. Excellent drainage observed. Impression:               1. 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 Previous ERCP and subtotal cholecystectomy                            January 2024. Moderate Sedation:      none Recommendation:           1. Standard post ERCP care                           2. Advance diet                           3. Continue antibiotics                           4. Follow-up brushings                           5. Monitor liver test medical course                           6.. Further plans pending the outcome of the above.                            May need EUS. May need spyglass assessment of the                            bile duct. May need both                           Discussed with the patient's son who was provided a  copy of this report Procedure Code(s):        --- Professional ---                           [phone], Endoscopic retrograde                            cholangiopancreatography (ERCP); with placement of                            endoscopic stent into biliary or pancreatic duct,                            including pre- and post-dilation and guide wire                            passage, when performed, including sphincterotomy,                            when performed, each stent Diagnosis Code(s):        --- Professional ---                           K83.1, Obstruction of bile duct                           R10.9, Unspecified abdominal pain                           K83.9, Disease of biliary tract, unspecified                           R74.8, Abnormal levels of other serum enzymes CPT copyright 2022 [employer]. All rights reserved. The codes documented in this report are preliminary and upon coder review may  be revised to meet current compliance requirements. Wilhemina Bonito. Marina Goodell, MD 06/29/2022 3:34:04 PM This report has been signed electronically. Number of Addenda: 0

## 2022-06-30 DIAGNOSIS — T7840XA Allergy, unspecified, initial encounter: Secondary | ICD-10-CM | POA: Diagnosis not present

## 2022-06-30 DIAGNOSIS — K831 Obstruction of bile duct: Secondary | ICD-10-CM

## 2022-06-30 DIAGNOSIS — R509 Fever, unspecified: Secondary | ICD-10-CM | POA: Diagnosis not present

## 2022-06-30 DIAGNOSIS — A419 Sepsis, unspecified organism: Secondary | ICD-10-CM | POA: Diagnosis not present

## 2022-06-30 DIAGNOSIS — K750 Abscess of liver: Secondary | ICD-10-CM | POA: Diagnosis not present

## 2022-06-30 LAB — CBC
HCT: 27.7 % — ABNORMAL LOW (ref 36.0–46.0)
Hemoglobin: 9.4 g/dL — ABNORMAL LOW (ref 12.0–15.0)
MCH: 29.4 pg (ref 26.0–34.0)
MCHC: 33.9 g/dL (ref 30.0–36.0)
MCV: 86.6 fL (ref 80.0–100.0)
Platelets: 255 10*3/uL (ref 150–400)
RBC: 3.2 MIL/uL — ABNORMAL LOW (ref 3.87–5.11)
RDW: 13.6 % (ref 11.5–15.5)
WBC: 7.8 10*3/uL (ref 4.0–10.5)
nRBC: 0 % (ref 0.0–0.2)

## 2022-06-30 LAB — COMPREHENSIVE METABOLIC PANEL
ALT: 61 U/L — ABNORMAL HIGH (ref 0–44)
AST: 35 U/L (ref 15–41)
Albumin: 2.4 g/dL — ABNORMAL LOW (ref 3.5–5.0)
Alkaline Phosphatase: 310 U/L — ABNORMAL HIGH (ref 38–126)
Anion gap: 5 (ref 5–15)
BUN: 16 mg/dL (ref 8–23)
CO2: 23 mmol/L (ref 22–32)
Calcium: 7.8 mg/dL — ABNORMAL LOW (ref 8.9–10.3)
Chloride: 102 mmol/L (ref 98–111)
Creatinine, Ser: 0.58 mg/dL (ref 0.44–1.00)
GFR, Estimated: >60 mL/min (ref >60)
Glucose, Bld: 111 mg/dL — ABNORMAL HIGH (ref 70–99)
Potassium: 3.5 mmol/L (ref 3.5–5.1)
Sodium: 130 mmol/L — ABNORMAL LOW (ref 135–145)
Total Bilirubin: 0.5 mg/dL (ref 0.3–1.2)
Total Protein: 5.9 g/dL — ABNORMAL LOW (ref 6.5–8.1)

## 2022-06-30 LAB — MAGNESIUM: Magnesium: 1.6 mg/dL — ABNORMAL LOW (ref 1.7–2.4)

## 2022-06-30 LAB — GLUCOSE, CAPILLARY
Glucose-Capillary: 104 mg/dL — ABNORMAL HIGH (ref 70–99)
Glucose-Capillary: 121 mg/dL — ABNORMAL HIGH (ref 70–99)
Glucose-Capillary: 124 mg/dL — ABNORMAL HIGH (ref 70–99)
Glucose-Capillary: 137 mg/dL — ABNORMAL HIGH (ref 70–99)
Glucose-Capillary: 154 mg/dL — ABNORMAL HIGH (ref 70–99)
Glucose-Capillary: 155 mg/dL — ABNORMAL HIGH (ref 70–99)

## 2022-06-30 LAB — PHOSPHORUS: Phosphorus: 4.6 mg/dL (ref 2.5–4.6)

## 2022-06-30 MED ORDER — MAGNESIUM SULFATE 2 GM/50ML IV SOLN
2.0000 g | Freq: Once | INTRAVENOUS | Status: AC
  Administered 2022-06-30: 2 g via INTRAVENOUS
  Filled 2022-06-30: qty 50

## 2022-06-30 NOTE — Progress Notes (Addendum)
Patient ID: Lauren [name], female   DOB: 1940-05-31, 82 y.o.   MRN: [id number]    Progress Note   Subjective   Day # 3 CC; acute right upper quadrant pain x 3 days, fever, concern for biliary obstruction on CT  IV Azactam/metronidazole/vancomycin  ERCP yesterday-there was some blood emanating from the ampulla along with bile, shelflike stricture 2 cm in length at the common hepatic duct, no filling defects, EXTR brushed multiple times and 10 French 7 cm plastic stent was placed into the right hepatic duct  Cytology pending  Today-WBC 7.8/hemoglobin 9.4/hematocrit 27.7 stable Sodium 130/potassium 3.5/BUN 16/creatinine/0.58 T. bili 0.5/AST 35/ALT 61/alk phos 310 Blood cultures no growth x 3 days  Patient says she feels fine, denies any abdominal pain, no nausea, tolerated full liquids for breakfast Has been afebrile   Objective   Vital signs in last 24 hours: Temp:  [97.5 F (36.4 C)-98.3 F (36.8 C)] 98.3 F (36.8 C) (04/17 0443) Pulse Rate:  [57-86] 57 (04/17 0443) Resp:  [13-18] 15 (04/17 0443) BP: (106-209)/(43-74) 148/58 (04/17 0443) SpO2:  [98 %-100 %] 99 % (04/17 0443) Last BM Date : 06/27/22 General:    Elderly Asian female in NAD Heart:  Regular rate and rhythm; no murmurs Lungs: Respirations even and unlabored, lungs CTA bilaterally Abdomen:  Soft, nontender and nondistended. Normal bowel sounds. Extremities:  Without edema. Neurologic:  Alert and oriented,  grossly normal neurologically. Psych:  Cooperative. Normal mood and affect.  Intake/Output from previous day: 04/16 0701 - 04/17 0700 In: 1000 [I.V.:1000] Out: 0  Intake/Output this shift: Total I/O In: 240 [P.O.:240] Out: -   Lab Results: Recent Labs    06/28/22 0441 06/29/22 0415 06/30/22 0407  WBC 8.8 12.0* 7.8  HGB 10.1* 8.7* 9.4*  HCT 29.3* 25.5* 27.7*  PLT 233 226 255   BMET Recent Labs    06/28/22 0441 06/29/22 0415 06/30/22 0407  NA 129* 135 130*  K 3.3* 3.3* 3.5  CL 98 104 102   CO2 20* 23 23  GLUCOSE 293* 117* 111*  BUN CREATININE 0.53 0.49 0.58  CALCIUM 8.3* 8.1* 7.8*   LFT Recent Labs    06/30/22 0407  PROT 5.9*  ALBUMIN 2.4*  AST 35  ALT 61*  ALKPHOS 310*  BILITOT 0.5   PT/INR Recent Labs    06/27/22 2244 06/28/22 1254  LABPROT 13.6 13.8  INR 1.0 1.1    Studies/Results: DG ERCP  Result Date: 06/29/2022 CLINICAL DATA:  ERCP with stent placement. EXAM: ERCP TECHNIQUE: Multiple spot images obtained with the fluoroscopic device and submitted for interpretation post-procedure. FLUOROSCOPY TIME: FLUOROSCOPY TIME 5 minutes, 35 seconds (26.7 mGy) COMPARISON:  MRCP-06/28/2022; CT abdomen pelvis-06/27/2022; ERCP-03/29/2022 FINDINGS: Thirteen spot fluoroscopic images of the right upper abdominal quadrant during ERCP are provided for review Initial image demonstrates an ERCP probe overlying the right upper abdominal quadrant. Subsequent images demonstrate selective cannulation and opacification of the common bile duct with occlusion of the CBD just peripheral to the level of the biliary hilum with associated suspected malignant shouldering, similar to findings on preceding abdominal CT Subsequent images demonstrate placement of an internal stent traversing the occlusion of the mid and distal aspects of the CBD. IMPRESSION: ERCP with biliary stent placement for potential malignant occlusion of the CBD. Correlation with the operative report is advised. These images were submitted for radiologic interpretation only. Please see the procedural report for the amount of contrast and the fluoroscopy time utilized. Electronically Signed  By: Simonne Come M.D.   On: 06/29/2022 15:55   MR ABDOMEN MRCP W WO CONTAST  Result Date: 06/28/2022 CLINICAL DATA:  Abdominal pain with fever and leukocytosis status post laparoscopic partial cholecystectomy 03/31/2022 complicated by hepatic abscesses. Concern for biliary obstruction on recent CT. EXAM: MRI ABDOMEN WITHOUT AND  WITH CONTRAST (INCLUDING MRCP) TECHNIQUE: Multiplanar multisequence MR imaging of the abdomen was performed both before and after the administration of intravenous contrast. Heavily T2-weighted images of the biliary and pancreatic ducts were obtained, and three-dimensional MRCP images were rendered by post processing. CONTRAST:  4.83mL GADAVIST GADOBUTROL 1 MMOL/ML IV SOLN COMPARISON:  Abdominopelvic CT 06/27/2022. Abdominal MRI 04/05/2022. FINDINGS: Despite efforts by the technologist and patient, mild motion artifact is present on today's exam and could not be eliminated. This reduces exam sensitivity and specificity. Lower chest:  Trace bilateral pleural effusions. Hepatobiliary: No significant hepatic steatosis or morphologic changes of cirrhosis are demonstrated. The previously demonstrated subcapsular abscesses in the right hepatic lobe have nearly resolved status post cholecystectomy. As seen on recent CT, there is new moderate diffuse intrahepatic biliary dilatation with abrupt transition point in the hepatic hilum. As suggested on CT, the there is a possible enhancing mass in the porta hepatis causing the biliary obstruction, measuring approximately 1.5 x 1.3 cm on image 31/19. The common bile duct appears decompressed. Pancreas: No pancreatic ductal dilatation, mass lesion or surrounding inflammation. Spleen: Normal in size without focal abnormality. Adrenals/Urinary Tract: Both adrenal glands appear normal. The kidneys appear unremarkable, without evidence of hydronephrosis or perinephric soft tissue stranding. Stomach/Bowel: The stomach appears unremarkable for its degree of distension. No evidence of bowel wall thickening, distention or surrounding inflammatory change. Vascular/Lymphatic: Aside from the enhancing lesion in the porta hepatis, no enlarged abdominal lymph nodes are identified. Diffuse aortic and branch vessel atherosclerosis, better seen on CT. No evidence of aneurysm or large vessel  occlusion. The portal, superior mesenteric and splenic veins are patent. Other: Trace ascites.  No evidence of peritoneal nodularity. Musculoskeletal: No acute or significant osseous findings. IMPRESSION: 1. New moderate diffuse intrahepatic biliary dilatation with abrupt transition point in the hepatic hilum. As suggested on recent CT, there is a possible enhancing mass in the porta hepatis causing the biliary obstruction. Findings remain suspicious for cholangiocarcinoma. Recommend ERCP with biliary stenting and brushing. 2. The previously demonstrated subcapsular abscesses in the right hepatic lobe have nearly resolved status post cholecystectomy. 3. No evidence of pancreatic ductal dilatation or pancreatic mass. 4. Trace ascites and trace bilateral pleural effusions. 5.  Aortic Atherosclerosis (ICD10-I70.0). Electronically Signed   By: Carey Bullocks M.D.   On: 06/28/2022 12:43   MR 3D Recon At Scanner  Result Date: 06/28/2022 CLINICAL DATA:  Abdominal pain with fever and leukocytosis status post laparoscopic partial cholecystectomy 03/31/2022 complicated by hepatic abscesses. Concern for biliary obstruction on recent CT. EXAM: MRI ABDOMEN WITHOUT AND WITH CONTRAST (INCLUDING MRCP) TECHNIQUE: Multiplanar multisequence MR imaging of the abdomen was performed both before and after the administration of intravenous contrast. Heavily T2-weighted images of the biliary and pancreatic ducts were obtained, and three-dimensional MRCP images were rendered by post processing. CONTRAST:  4.42mL GADAVIST GADOBUTROL 1 MMOL/ML IV SOLN COMPARISON:  Abdominopelvic CT 06/27/2022. Abdominal MRI 04/05/2022. FINDINGS: Despite efforts by the technologist and patient, mild motion artifact is present on today's exam and could not be eliminated. This reduces exam sensitivity and specificity. Lower chest:  Trace bilateral pleural effusions. Hepatobiliary: No significant hepatic steatosis or morphologic changes of cirrhosis are  demonstrated.  The previously demonstrated subcapsular abscesses in the right hepatic lobe have nearly resolved status post cholecystectomy. As seen on recent CT, there is new moderate diffuse intrahepatic biliary dilatation with abrupt transition point in the hepatic hilum. As suggested on CT, the there is a possible enhancing mass in the porta hepatis causing the biliary obstruction, measuring approximately 1.5 x 1.3 cm on image 31/19. The common bile duct appears decompressed. Pancreas: No pancreatic ductal dilatation, mass lesion or surrounding inflammation. Spleen: Normal in size without focal abnormality. Adrenals/Urinary Tract: Both adrenal glands appear normal. The kidneys appear unremarkable, without evidence of hydronephrosis or perinephric soft tissue stranding. Stomach/Bowel: The stomach appears unremarkable for its degree of distension. No evidence of bowel wall thickening, distention or surrounding inflammatory change. Vascular/Lymphatic: Aside from the enhancing lesion in the porta hepatis, no enlarged abdominal lymph nodes are identified. Diffuse aortic and branch vessel atherosclerosis, better seen on CT. No evidence of aneurysm or large vessel occlusion. The portal, superior mesenteric and splenic veins are patent. Other: Trace ascites.  No evidence of peritoneal nodularity. Musculoskeletal: No acute or significant osseous findings. IMPRESSION: 1. New moderate diffuse intrahepatic biliary dilatation with abrupt transition point in the hepatic hilum. As suggested on recent CT, there is a possible enhancing mass in the porta hepatis causing the biliary obstruction. Findings remain suspicious for cholangiocarcinoma. Recommend ERCP with biliary stenting and brushing. 2. The previously demonstrated subcapsular abscesses in the right hepatic lobe have nearly resolved status post cholecystectomy. 3. No evidence of pancreatic ductal dilatation or pancreatic mass. 4. Trace ascites and trace bilateral pleural  effusions. 5.  Aortic Atherosclerosis (ICD10-I70.0). Electronically Signed   By: Carey Bullocks M.D.   On: 06/28/2022 12:43       Assessment / Plan:    #95 82 year old non-English-speaking [place] female mated with 3-day history of acute right upper quadrant pain and fever Recent complicated course usually presenting in January 2024 with abdominal pain fever and jaundice-underwent ERCP for suspected ascending cholangitis, no definite filling defects but had evidence of sludge and mucopurulent debris. She had subsequent partial laparoscopic cholecystectomy 03/31/2022 then course complicated by 2 small hepatic abscesses She was managed with a 4-week course of antibiotics and followed by ID-antibiotics discontinued on 05/26/2022  Did well for a couple of weeks then onset of current symptoms.  MRI MRCP this admission-done for above symptoms as well as leukocytosis and elevated LFTs showed the previous subcapsular abscess is nearly resolved, new moderate diffuse intrahepatic biliary dilation with abrupt transition in the hepatic hilum concerning for enhancing mass at the porta hepatis causing biliary obstruction measuring 1.5 x 1.3 cm, trace ascites and bilateral effusions  ERCP yesterday with finding of shelflike stricture of the common hepatic duct, no filling defects, stricture was brushed multiple times for cytology and 10 French 7 cm plastic stent placed into the right hepatic duct  Patient tolerated procedure well, feels fine this morning Remains on antibiotics  Plan; have asked pathology to rush cytology Plan to keep patient in the hospital until cytology results, as it would be much easier to expedite any further procedures and/or workup as an inpatient.  Advance to regular diet May be able to discontinue antibiotics tomorrow     Principal Problem:   Sepsis Active Problems:   HTN (hypertension)   Hyperlipidemia   Macrocytic anemia   LFT elevation   Hyponatremia   Allergic  reaction   Hypomagnesemia   Fever   Bile duct stricture   Biliary obstruction     LOS:  3 days   Amy Esterwood  PA-C 06/30/2022, 10:37 AM  GI ATTENDING  Interval history data reviewed.  Patient seen and examined.  Agree with interval progress note as outlined above without additions or deletions.  Awaiting cytology to determine further plans in assessing this indeterminate biliary stricture.  I spoke to her son yesterday in great detail including diagrams and future plans.  Wilhemina Bonito. Eda Keys., M.D. [facility]

## 2022-06-30 NOTE — Progress Notes (Signed)
PROGRESS NOTE    Lauren [name]  UEA:[id number] DOB: 1940/04/09 DOA: 06/27/2022 PCP: Pearson Grippe, MD   Brief Narrative:  The patient is an 82 year old [place] speaking female with history of DM2, HLD, hypothyroidism, CKD 2, recurrent UTIs, chronic urinary obstruction requiring self-catheterization comes into the hospital with right upper quadrant pain. This has been going on for the past 3 days, associated with nausea. She apparently had cholecystectomy 3 months ago and her hospitalization has been complicated by liver abscesses, elevated LFTs. She follows with ID as an outpatient and just finished antibiotics last month. She was febrile to 101.6 in the ER but that was after she received Zosyn and had an apparent allergic reaction with shortness of breath, hypoxia, wheezing requiring Solu-Medrol   She was admitted for elevated LFTs, abdominal pain, nausea and vomiting with concern for bili obstruction and GI and general surgery were consulted.  She underwent further workup and had a stenting of her right hepatic duct on ERCP yesterday.  Gastroenterology recommending keeping her in the hospital until cytology results to determine further plans and addressing the indeterminate biliary stricture.  Diet is now being advanced back to a regular diet.   Assessment and Plan:  Elevated LFTs, abdominal pain, nausea, vomiting with concern for Biliary Obstruction rule out Cholangiocarcinoma -imaging in the ER with CT scan abdomen pelvis showed increased diffuse dilatation of intrahepatic bile ducts due to 2.5 cm soft tissue mass or lymphadenopathy in the porta hepatis.   -There is concern for cholangiocarcinoma versus metastatic lymphadenopathy.  GI and general surgery were consulted -Has been placed on antibiotics, continue -MRI/MRCP confirmed intrahepatic biliary dilatation with abrupt transition point in the hepatic hilum.   -Liver Panel Trend: Recent Labs  Lab 06/27/22 1608 06/28/22 0441 06/29/22 0415  06/30/22 0407  AST 95* 53* 32 35  ALT 115* 95* 64* 61*  BILITOT 1.1 0.6 0.5 0.5  ALKPHOS 396* 367* 286* 310*  -She was planned for an ERCP with biliary stenting and brushing yesterday afternoon she was found to have a shelflike stricture of the common hepatic duct with no filling defects and the stricture was brushed multiple times for cytology and she had a stent placed in the right hepatic duct -He was on antibiotics and ID has stopped -GI recommended to keep in the hospital until cytology results given that this would expedite any further procedures and/or workup as an inpatient -Diet has been advanced to regular diet.   Hyponatremia  -Likely in the setting of poor p.o. intake given hypochloremia as well.   -Received fluids, sodium normalized yesterday but dropped again -Na+ Trend: Recent Labs  Lab 06/27/22 1608 06/28/22 0441 06/29/22 0415 06/30/22 0407  NA 124* 129* 135 130*  -Continue to Monitor and Trend -Repeat CMP in the AM    Neurogenic bladder  -Continue self- catheterization.   -She does have evidence of neurogenic bladder on the CT scan   Diverticulosis  -Incidentally noted   Anemia, of chronic disease  -Hgb/Hct Trend: Recent Labs  Lab 06/27/22 1608 06/28/22 0441 06/29/22 0415 06/30/22 0407  HGB 9.8* 10.1* 8.7* 9.4*  HCT 28.5* 29.3* 25.5* 27.7*  MCV 86.9 87.2 88.2 86.6  -hemoglobin stable, no bleeding   History of Hepatic Abscess  -Noted in January, completed antibiotics, seen by ID most recently on 3/13 when her antibiotics were stopped.   -Repeat imaging showed improvement of her abscesses.   -ID following but her hepatic abscesses have resolved there is no indication for continuation of antibiotics they  are now being stopped by the infectious disease team   Hyperlipidemia  -Continue Pravastatin 80 mg po Daily    Allergic reaction to Zosyn -In the ER on 4/14, felt not to be true allergic reaction.  She has been placed on Unasyn by ID but now being  stopped   Hypokalemia -Patient's K+ Level Trend: Recent Labs  Lab 06/27/22 1608 06/28/22 0441 06/29/22 0415 06/30/22 0407  K 3.9 3.3* 3.3* 3.5  -Replete with po Kcl 40 mEQ BID x2 -Continue to Monitor and Replete as Necessary -Repeat CMP in the AM   Hypomagnesemia -Patient's Mag Level Trend: Recent Labs  Lab 06/27/22 2244 06/28/22 0441 06/29/22 0415 06/30/22 0407  MG 1.5* 2.1 1.7 1.6*  -Replete with IV Mag Sulfate 2 grams -Continue to Monitor and Replete as Necessary -Repeat Mag in the AM   Essential hypertension  -Continue to Monitor Blood Pressure per protocol -If persists high will need resumption of her home Cozaar (50 mg)   DM2, with Hyperglycemia  -High CBGs likely in the setting of the steroid she got in the ER.   -Continue to Hold home Janumet.   -Continue Sensitive Novolog Sliding Scale q4h -CBG trend:  Recent Labs  Lab 06/29/22 1651 06/29/22 2056 06/30/22 0020 06/30/22 0439 06/30/22 0747 06/30/22 1129 06/30/22 1646  GLUCAP 132* 192* 124* 104* 137* 121* 154*   DVT prophylaxis: SCDs Start: 06/27/22 2158    Code Status: Full Code Family Communication: No family currently at bedside  Disposition Plan:  Level of care: Progressive Status is: Inpatient Remains inpatient appropriate because: Needs further clinical improvement and clearance by the specialists   Consultants:  Infection Diseases General surgery Gastroenterology  Procedures:  ERCP Findings:      1. The side-viewing duodena scope was passed blindly into the esophagus.       The stomach and duodenum were grossly normal. The major ampulla was       unremarkable. Pressure enterotomy had been performed. The minor ampulla       was not sought.      2. A scout radiograph of the abdomen with the endoscope and position was       unremarkable      3. The common bile duct was freely cannulated on the first attempt.       Injection of contrast yielded a 2 cm in length shelflike stricture of        the common hepatic duct. No filling defects.      4. Blood was emanating from the ampulla along with bile.      5. The stricture was brushed multiple times and submitted for cytologic       analysis.      6. A 10 French 7 cm plastic stent was placed into the right hepatic duct       in good position.      7. Excellent drainage observed. Impression:               1. Indeterminate biliary stricture status post                            brushings and stent placement                           2. Previous ERCP and subtotal cholecystectomy  January 2024. Moderate Sedation:      none Recommendation:           1. Standard post ERCP care                           2. Advance diet                           3. Continue antibiotics                           4. Follow-up brushings                           5. Monitor liver test medical course                           6.. Further plans pending the outcome of the above.                            May need EUS. May need spyglass assessment of the                            bile duct. May need both                           Discussed with the patient's son who was provided a                            copy of this report   Antimicrobials:  Anti-infectives (From admission, onward)    Start     Dose/Rate Route Frequency Ordered Stop   06/28/22 2200  vancomycin (VANCOREADY) IVPB 500 mg/100 mL  Status:  Discontinued        500 mg 100 mL/hr over 60 Minutes Intravenous Every 24 hours 06/27/22 2212 06/28/22 1540   06/28/22 2200  Ampicillin-Sulbactam (UNASYN) 3 g in sodium chloride 0.9 % 100 mL IVPB  Status:  Discontinued        3 g 200 mL/hr over 30 Minutes Intravenous Every 6 hours 06/28/22 1540 06/30/22 1202   06/28/22 0600  aztreonam (AZACTAM) 1 g in sodium chloride 0.9 % 100 mL IVPB  Status:  Discontinued        1 g 200 mL/hr over 30 Minutes Intravenous Every 8 hours 06/27/22 2133 06/27/22 2205   06/28/22 0600   aztreonam (AZACTAM) 2 g in sodium chloride 0.9 % 100 mL IVPB  Status:  Discontinued        2 g 200 mL/hr over 30 Minutes Intravenous Every 8 hours 06/27/22 2205 06/28/22 1540   06/27/22 2215  vancomycin (VANCOCIN) IVPB 1000 mg/200 mL premix        1,000 mg 200 mL/hr over 60 Minutes Intravenous  Once 06/27/22 2207 06/28/22 0127   06/27/22 2200  metroNIDAZOLE (FLAGYL) IVPB 500 mg  Status:  Discontinued        500 mg 100 mL/hr over 60 Minutes Intravenous Every 12 hours 06/27/22 2055 06/28/22 1540   06/27/22 2200  aztreonam (AZACTAM) 2 g in sodium chloride 0.9 % 100 mL IVPB        2 g 200 mL/hr over  30 Minutes Intravenous  Once 06/27/22 2138 06/28/22 0021   06/27/22 2100  aztreonam (AZACTAM) 2 g in sodium chloride 0.9 % 100 mL IVPB  Status:  Discontinued        2 g 200 mL/hr over 30 Minutes Intravenous  Once 06/27/22 2055 06/27/22 2133   06/27/22 1630  metroNIDAZOLE (FLAGYL) IVPB 500 mg  Status:  Discontinued        500 mg 100 mL/hr over 60 Minutes Intravenous Every 12 hours 06/27/22 1617 06/27/22 1621   06/27/22 1630  piperacillin-tazobactam (ZOSYN) IVPB 3.375 g        3.375 g 100 mL/hr over 30 Minutes Intravenous  Once 06/27/22 1621 06/27/22 1731       Subjective: Seen and examined at bedside and she was doing okay.  Had some abdominal discomfort but no real pain.  Wanting to advance her diet.  She was seen and examined with assistance of the [place] translator Carney Bern #[id number].  She has no other concerns or complaints at this time.  Objective: Vitals:   06/29/22 1649 06/29/22 2059 06/30/22 0443 06/30/22 1237  BP: (!) 106/43 (!) 130/59 (!) 148/58 (!) 146/59  Pulse: 86 70 (!) 57 (!) 57  Resp: Temp: (!) 97.5 F (36.4 C) 98.2 F (36.8 C) 98.3 F (36.8 C) 98.2 F (36.8 C)  TempSrc: Oral Oral Oral Oral  SpO2: 100% 98% 99% 99%  Weight:      Height:        Intake/Output Summary (Last 24 hours) at 06/30/2022 1717 Last data filed at 06/30/2022 0801 Gross per 24 hour  Intake  240 ml  Output --  Net 240 ml   Filed Weights   06/27/22 1515  Weight: 47.6 kg   Examination: Physical Exam:  Constitutional: Thin elderly [place] female in no acute distress Respiratory: Diminished to auscultation bilaterally, no wheezing, rales, rhonchi or crackles. Normal respiratory effort and patient is not tachypenic. No accessory muscle use.  Unlabored breathing Cardiovascular: RRR, no murmurs / rubs / gallops. S1 and S2 auscultated. No extremity edema Abdomen: Soft, slightly-tender, mildly-distended.  Bowel sounds positive.  GU: Deferred. Musculoskeletal: No clubbing / cyanosis of digits/nails. No joint deformity upper and lower extremities. Skin: No rashes, lesions, ulcers on limited skin evaluation. No induration; Warm and dry.  Neurologic: CN 2-12 grossly intact with no focal deficits. Romberg sign and cerebellar reflexes not assessed.  Psychiatric: Normal judgment and insight. Alert and oriented x 3. Normal mood and appropriate affect.   Data Reviewed: I have personally reviewed following labs and imaging studies  CBC: Recent Labs  Lab 06/27/22 1608 06/28/22 0441 06/29/22 0415 06/30/22 0407  WBC 11.8* 8.8 12.0* 7.8  NEUTROABS 10.0*  --  9.1*  --   HGB 9.8* 10.1* 8.7* 9.4*  HCT 28.5* 29.3* 25.5* 27.7*  MCV 86.9 87.2 88.2 86.6  PLT 234 233 226 255   Basic Metabolic Panel: Recent Labs  Lab 06/27/22 1608 06/27/22 2244 06/28/22 0441 06/29/22 0415 06/30/22 0407  NA 124*  --  129* 135 130*  K 3.9  --  3.3* 3.3* 3.5  CL 93*  --  98 104 102  CO2 22  --  20* 23 23  GLUCOSE 242*  --  293* 117* 111*  BUN 11  --  CREATININE 0.61  --  0.53 0.49 0.58  CALCIUM 8.1*  --  8.3* 8.1* 7.8*  MG  --  1.5* 2.1 1.7 1.6*  PHOS  --  3.0 3.0 2.3* 4.6   GFR: Estimated Creatinine Clearance: 38.3 mL/min (by C-G formula based on SCr of 0.58 mg/dL). Liver Function Tests: Recent Labs  Lab 06/27/22 1608 06/28/22 0441 06/29/22 0415 06/30/22 0407  AST 95* 53* 32 35   ALT 115* 95* 64* 61*  ALKPHOS 396* 367* 286* 310*  BILITOT 1.1 0.6 0.5 0.5  PROT 6.8 6.6 5.8* 5.9*  ALBUMIN 2.8* 2.7* 2.3* 2.4*   Recent Labs  Lab 06/27/22 1608  LIPASE 51   No results for input(s): "AMMONIA" in the last 168 hours. Coagulation Profile: Recent Labs  Lab 06/27/22 2244 06/28/22 1254  INR 1.0 1.1   Cardiac Enzymes: Recent Labs  Lab 06/27/22 2244  CKTOTAL 9*   BNP (last 3 results) No results for input(s): "PROBNP" in the last 8760 hours. HbA1C: No results for input(s): "HGBA1C" in the last 72 hours. CBG: Recent Labs  Lab 06/30/22 0020 06/30/22 0439 06/30/22 0747 06/30/22 1129 06/30/22 1646  GLUCAP 124* 104* 137* 121* 154*   Lipid Profile: No results for input(s): "CHOL", "HDL", "LDLCALC", "TRIG", "CHOLHDL", "LDLDIRECT" in the last 72 hours. Thyroid Function Tests: Recent Labs    06/27/22 2244  TSH 1.035   Anemia Panel: No results for input(s): "VITAMINB12", "FOLATE", "FERRITIN", "TIBC", "IRON", "RETICCTPCT" in the last 72 hours. Sepsis Labs: Recent Labs  Lab 06/27/22 1716 06/27/22 1936 06/27/22 2244  PROCALCITON  --   --  5.58  LATICACIDVEN 2.5* 1.2  --     Recent Results (from the past 240 hour(s))  SARS Coronavirus 2 by RT PCR (hospital order, performed in [facility] hospital lab) *cepheid single result test* Anterior Nasal Swab     Status: None   Collection Time: 06/27/22  4:29 PM   Specimen: Anterior Nasal Swab  Result Value Ref Range Status   SARS Coronavirus 2 by RT PCR NEGATIVE NEGATIVE Final    Comment: (NOTE) SARS-CoV-2 target nucleic acids are NOT DETECTED.  The SARS-CoV-2 RNA is generally detectable in upper and lower respiratory specimens during the acute phase of infection. The lowest concentration of SARS-CoV-2 viral copies this assay can detect is 250 copies / mL. A negative result does not preclude SARS-CoV-2 infection and should not be used as the sole basis for treatment or other patient management decisions.  A  negative result may occur with improper specimen collection / handling, submission of specimen other than nasopharyngeal swab, presence of viral mutation(s) within the areas targeted by this assay, and inadequate number of viral copies (<250 copies / mL). A negative result must be combined with clinical observations, patient history, and epidemiological information.  Fact Sheet for Patients:   RoadLapTop.co.za  Fact Sheet for Healthcare Providers: http://Huhta-miller.com/  This test is not yet approved or  cleared by the [place] FDA and has been authorized for detection and/or diagnosis of SARS-CoV-2 by FDA under an Emergency Use Authorization (EUA).  This EUA will remain in effect (meaning this test can be used) for the duration of the COVID-19 declaration under Section 564(b)(1) of the Act, 21 U.S.C. section 360bbb-3(b)(1), unless the authorization is terminated or revoked sooner.  Performed at [facility], [address] [place], [place] [address]   Blood culture (routine x 2)     Status: None (Preliminary result)   Collection Time: 06/27/22  4:45 PM   Specimen: BLOOD RIGHT FOREARM  Result Value Ref Range Status   Specimen Description   Final    BLOOD RIGHT FOREARM Performed at [facility],  [address] [place], [place] [address]    Special Requests   Final    BOTTLES DRAWN AEROBIC ONLY Blood Culture adequate volume Performed at [facility], [address] [place], [place] [address]    Culture   Final    NO GROWTH 3 DAYS Performed at [facility], [address], [place], [place] [address]    Report Status PENDING  Incomplete  Blood culture (routine x 2)     Status: None (Preliminary result)   Collection Time: 06/27/22  4:51 PM   Specimen: BLOOD  Result Value Ref Range Status   Specimen Description   Final    BLOOD LEFT ANTECUBITAL Performed at [facility], [address] [place], [place] [address]    Special Requests   Final    BOTTLES DRAWN AEROBIC AND ANAEROBIC Blood Culture adequate volume Performed at [facility], [address] [place], [place] [address]    Culture   Final    NO GROWTH 3 DAYS Performed at [facility], [address] [place], [place] [address]    Report Status PENDING  Incomplete  Respiratory (~20 pathogens) panel by PCR     Status: None   Collection Time: 06/27/22  5:45 PM  Result Value Ref Range Status   Adenovirus NOT DETECTED NOT DETECTED Final   Coronavirus 229E NOT DETECTED NOT DETECTED Final    Comment: (NOTE) The Coronavirus on the Respiratory Panel, DOES NOT test for the novel  Coronavirus (2019 nCoV)    Coronavirus HKU1 NOT DETECTED NOT DETECTED Final   Coronavirus NL63 NOT DETECTED NOT DETECTED Final   Coronavirus OC43 NOT DETECTED NOT DETECTED Final   Metapneumovirus NOT DETECTED NOT DETECTED Final   Rhinovirus / Enterovirus NOT DETECTED NOT DETECTED Final   Influenza A NOT DETECTED NOT DETECTED Final   Influenza B NOT DETECTED NOT DETECTED Final   Parainfluenza Virus 1 NOT DETECTED NOT DETECTED Final   Parainfluenza Virus 2 NOT DETECTED NOT DETECTED Final   Parainfluenza Virus 3 NOT DETECTED NOT DETECTED Final   Parainfluenza Virus 4 NOT DETECTED NOT DETECTED Final   Respiratory Syncytial Virus NOT DETECTED NOT DETECTED Final   Bordetella pertussis NOT DETECTED NOT DETECTED Final   Bordetella Parapertussis NOT DETECTED NOT DETECTED Final   Chlamydophila pneumoniae NOT DETECTED NOT DETECTED Final   Mycoplasma pneumoniae NOT DETECTED NOT DETECTED Final    Comment: Performed at [facility], [address], [place], [place] [address]    Radiology Studies: DG ERCP  Result Date: 06/29/2022 CLINICAL DATA:  ERCP with stent placement. EXAM: ERCP TECHNIQUE: Multiple spot images obtained with the fluoroscopic device and submitted for interpretation  post-procedure. FLUOROSCOPY TIME: FLUOROSCOPY TIME 5 minutes, 35 seconds (26.7 mGy) COMPARISON:  MRCP-06/28/2022; CT abdomen pelvis-06/27/2022; ERCP-03/29/2022 FINDINGS: Thirteen spot fluoroscopic images of the right upper abdominal quadrant during ERCP are provided for review Initial image demonstrates an ERCP probe overlying the right upper abdominal quadrant. Subsequent images demonstrate selective cannulation and opacification of the common bile duct with occlusion of the CBD just peripheral to the level of the biliary hilum with associated suspected malignant shouldering, similar to findings on preceding abdominal CT Subsequent images demonstrate placement of an internal stent traversing the occlusion of the mid and distal aspects of the CBD. IMPRESSION: ERCP with biliary stent placement for potential malignant occlusion of the CBD. Correlation with the operative report is advised. These images were submitted for radiologic interpretation only.  Please see the procedural report for the amount of contrast and the fluoroscopy time utilized. Electronically Signed   By: Simonne Come M.D.   On: 06/29/2022 15:55    Scheduled Meds:  insulin aspart  0-9 Units Subcutaneous Q4H   levothyroxine  75 mcg Oral Daily   pravastatin  80 mg Oral Daily   Continuous Infusions:   LOS: 3 days   Marguerita Merles, DO [facility] Available via Epic secure chat 7am-7pm After these hours, please refer to coverage provider listed on [email or web address] 06/30/2022, 5:17 PM

## 2022-06-30 NOTE — Progress Notes (Signed)
.   Transition of Care [facility]) Screening Note   Patient Details  Name: Lauren [name] Date of Birth: [birth year]   Transition of Care [facility]) CM/SW Contact:    Larrie Kass, LCSW Phone Number: 06/30/2022, 4:00 PM    Transition of Care Department [facility]) has reviewed patient and no TOC needs have been identified at this time. We will continue to monitor patient advancement through interdisciplinary progression rounds. If new patient transition needs arise, please place a TOC consult.

## 2022-06-30 NOTE — Progress Notes (Signed)
[facility]   Reason for visit: Follow up on hepatic abscesses  Interval History: resolved abscess now with abdominal pain and dilitation of intra hepatic bile ducts    Physical Exam: Constitutional:  Vitals:   06/30/22 0443 06/30/22 1237  BP: (!) 148/58 (!) 146/59  Pulse: (!) 57 (!) 57  Resp: 15 16  Temp: 98.3 F (36.8 C) 98.2 F (36.8 C)  SpO2: 99% 99%   patient appears in NAD Respiratory: Normal respiratory effort  Review of Systems: Constitutional: negative for fevers and chills  Lab Results  Component Value Date   WBC 7.8 06/30/2022   HGB 9.4 (L) 06/30/2022   HCT 27.7 (L) 06/30/2022   MCV 86.6 06/30/2022   PLT 255 06/30/2022    Lab Results  Component Value Date   CREATININE 0.58 06/30/2022   BUN 16 06/30/2022   NA 130 (L) 06/30/2022   K 3.5 06/30/2022   CL 102 06/30/2022   CO2 23 06/30/2022    Lab Results  Component Value Date   ALT 61 (H) 06/30/2022   AST 35 06/30/2022   GGT 415 (H) 03/27/2022   ALKPHOS 310 (H) 06/30/2022     Microbiology: Recent Results (from the past 240 hour(s))  SARS Coronavirus 2 by RT PCR (hospital order, performed in [facility] hospital lab) *cepheid single result test* Anterior Nasal Swab     Status: None   Collection Time: 06/27/22  4:29 PM   Specimen: Anterior Nasal Swab  Result Value Ref Range Status   SARS Coronavirus 2 by RT PCR NEGATIVE NEGATIVE Final    Comment: (NOTE) SARS-CoV-2 target nucleic acids are NOT DETECTED.  The SARS-CoV-2 RNA is generally detectable in upper and lower respiratory specimens during the acute phase of infection. The lowest concentration of SARS-CoV-2 viral copies this assay can detect is 250 copies / mL. A negative result does not preclude SARS-CoV-2 infection and should not be used as the sole basis for treatment or other patient management decisions.  A negative result may occur with improper specimen collection / handling, submission of specimen  other than nasopharyngeal swab, presence of viral mutation(s) within the areas targeted by this assay, and inadequate number of viral copies (<250 copies / mL). A negative result must be combined with clinical observations, patient history, and epidemiological information.  Fact Sheet for Patients:   RoadLapTop.co.za  Fact Sheet for Healthcare Providers: http://Sons-miller.com/  This test is not yet approved or  cleared by the [place] FDA and has been authorized for detection and/or diagnosis of SARS-CoV-2 by FDA under an Emergency Use Authorization (EUA).  This EUA will remain in effect (meaning this test can be used) for the duration of the COVID-19 declaration under Section 564(b)(1) of the Act, 21 U.S.C. section 360bbb-3(b)(1), unless the authorization is terminated or revoked sooner.  Performed at [facility], [address] [place], [place] [address]   Blood culture (routine x 2)     Status: None (Preliminary result)   Collection Time: 06/27/22  4:45 PM   Specimen: BLOOD RIGHT FOREARM  Result Value Ref Range Status   Specimen Description   Final    BLOOD RIGHT FOREARM Performed at [facility], [address] [place], [place] [address]    Special Requests   Final    BOTTLES DRAWN AEROBIC ONLY Blood Culture adequate volume Performed at [facility], [address], [place], [place] [address]    Culture   Final  NO GROWTH 3 DAYS Performed at [facility], [address] [place], [place] [address]    Report Status PENDING  Incomplete  Blood culture (routine x 2)     Status: None (Preliminary result)   Collection Time: 06/27/22  4:51 PM   Specimen: BLOOD  Result Value Ref Range Status   Specimen Description   Final    BLOOD LEFT ANTECUBITAL Performed at [facility], [address] [place], [place] [address]    Special Requests   Final     BOTTLES DRAWN AEROBIC AND ANAEROBIC Blood Culture adequate volume Performed at [facility], [address], [place], [place] [address]    Culture   Final    NO GROWTH 3 DAYS Performed at [facility], [address], [place], [place] [address]    Report Status PENDING  Incomplete  Respiratory (~20 pathogens) panel by PCR     Status: None   Collection Time: 06/27/22  5:45 PM  Result Value Ref Range Status   Adenovirus NOT DETECTED NOT DETECTED Final   Coronavirus 229E NOT DETECTED NOT DETECTED Final    Comment: (NOTE) The Coronavirus on the Respiratory Panel, DOES NOT test for the novel  Coronavirus (2019 nCoV)    Coronavirus HKU1 NOT DETECTED NOT DETECTED Final   Coronavirus NL63 NOT DETECTED NOT DETECTED Final   Coronavirus OC43 NOT DETECTED NOT DETECTED Final   Metapneumovirus NOT DETECTED NOT DETECTED Final   Rhinovirus / Enterovirus NOT DETECTED NOT DETECTED Final   Influenza A NOT DETECTED NOT DETECTED Final   Influenza B NOT DETECTED NOT DETECTED Final   Parainfluenza Virus 1 NOT DETECTED NOT DETECTED Final   Parainfluenza Virus 2 NOT DETECTED NOT DETECTED Final   Parainfluenza Virus 3 NOT DETECTED NOT DETECTED Final   Parainfluenza Virus 4 NOT DETECTED NOT DETECTED Final   Respiratory Syncytial Virus NOT DETECTED NOT DETECTED Final   Bordetella pertussis NOT DETECTED NOT DETECTED Final   Bordetella Parapertussis NOT DETECTED NOT DETECTED Final   Chlamydophila pneumoniae NOT DETECTED NOT DETECTED Final   Mycoplasma pneumoniae NOT DETECTED NOT DETECTED Final    Comment: Performed at [facility], [address] [place], [place] [address]    Impression/Plan:  1. Hepatic abscesses - resolved and no indication for continuation of antibiotics.  Will stop.    2.  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  Followed by GI.    I will sign off, call with questions.

## 2022-07-01 DIAGNOSIS — E119 Type 2 diabetes mellitus without complications: Secondary | ICD-10-CM | POA: Diagnosis not present

## 2022-07-01 DIAGNOSIS — D649 Anemia, unspecified: Secondary | ICD-10-CM

## 2022-07-01 DIAGNOSIS — T7840XA Allergy, unspecified, initial encounter: Secondary | ICD-10-CM | POA: Diagnosis not present

## 2022-07-01 DIAGNOSIS — R509 Fever, unspecified: Secondary | ICD-10-CM

## 2022-07-01 DIAGNOSIS — R7989 Other specified abnormal findings of blood chemistry: Secondary | ICD-10-CM | POA: Diagnosis not present

## 2022-07-01 DIAGNOSIS — E781 Pure hyperglyceridemia: Secondary | ICD-10-CM

## 2022-07-01 DIAGNOSIS — E785 Hyperlipidemia, unspecified: Secondary | ICD-10-CM | POA: Diagnosis not present

## 2022-07-01 DIAGNOSIS — K831 Obstruction of bile duct: Secondary | ICD-10-CM | POA: Diagnosis not present

## 2022-07-01 DIAGNOSIS — A419 Sepsis, unspecified organism: Secondary | ICD-10-CM | POA: Diagnosis not present

## 2022-07-01 DIAGNOSIS — D72819 Decreased white blood cell count, unspecified: Secondary | ICD-10-CM

## 2022-07-01 LAB — COMPREHENSIVE METABOLIC PANEL
ALT: 50 U/L — ABNORMAL HIGH (ref 0–44)
AST: 30 U/L (ref 15–41)
Albumin: 2.5 g/dL — ABNORMAL LOW (ref 3.5–5.0)
Alkaline Phosphatase: 261 U/L — ABNORMAL HIGH (ref 38–126)
Anion gap: 8 (ref 5–15)
BUN: 15 mg/dL (ref 8–23)
CO2: 24 mmol/L (ref 22–32)
Calcium: 7.9 mg/dL — ABNORMAL LOW (ref 8.9–10.3)
Chloride: 102 mmol/L (ref 98–111)
Creatinine, Ser: 0.62 mg/dL (ref 0.44–1.00)
GFR, Estimated: >60 mL/min (ref >60)
Glucose, Bld: 100 mg/dL — ABNORMAL HIGH (ref 70–99)
Potassium: 3.3 mmol/L — ABNORMAL LOW (ref 3.5–5.1)
Sodium: 134 mmol/L — ABNORMAL LOW (ref 135–145)
Total Bilirubin: 0.7 mg/dL (ref 0.3–1.2)
Total Protein: 5.9 g/dL — ABNORMAL LOW (ref 6.5–8.1)

## 2022-07-01 LAB — CBC WITH DIFFERENTIAL/PLATELET
Abs Immature Granulocytes: 0.03 10*3/uL (ref 0.00–0.07)
Basophils Absolute: 0 10*3/uL (ref 0.0–0.1)
Basophils Relative: 0 %
Eosinophils Absolute: 0.1 10*3/uL (ref 0.0–0.5)
Eosinophils Relative: 1 %
HCT: 27.7 % — ABNORMAL LOW (ref 36.0–46.0)
Hemoglobin: 9.4 g/dL — ABNORMAL LOW (ref 12.0–15.0)
Immature Granulocytes: 0 %
Lymphocytes Relative: 48 %
Lymphs Abs: 3.2 10*3/uL (ref 0.7–4.0)
MCH: 29.8 pg (ref 26.0–34.0)
MCHC: 33.9 g/dL (ref 30.0–36.0)
MCV: 87.9 fL (ref 80.0–100.0)
Monocytes Absolute: 0.5 10*3/uL (ref 0.1–1.0)
Monocytes Relative: 8 %
Neutro Abs: 2.9 10*3/uL (ref 1.7–7.7)
Neutrophils Relative %: 43 %
Platelets: 284 10*3/uL (ref 150–400)
RBC: 3.15 MIL/uL — ABNORMAL LOW (ref 3.87–5.11)
RDW: 13.8 % (ref 11.5–15.5)
WBC: 6.7 10*3/uL (ref 4.0–10.5)
nRBC: 0 % (ref 0.0–0.2)

## 2022-07-01 LAB — PHOSPHORUS: Phosphorus: 3.3 mg/dL (ref 2.5–4.6)

## 2022-07-01 LAB — CYTOLOGY - NON PAP

## 2022-07-01 LAB — GLUCOSE, CAPILLARY
Glucose-Capillary: 136 mg/dL — ABNORMAL HIGH (ref 70–99)
Glucose-Capillary: 156 mg/dL — ABNORMAL HIGH (ref 70–99)
Glucose-Capillary: 190 mg/dL — ABNORMAL HIGH (ref 70–99)
Glucose-Capillary: 89 mg/dL (ref 70–99)
Glucose-Capillary: 93 mg/dL (ref 70–99)
Glucose-Capillary: 94 mg/dL (ref 70–99)

## 2022-07-01 LAB — MAGNESIUM: Magnesium: 1.9 mg/dL (ref 1.7–2.4)

## 2022-07-01 MED ORDER — ACETAMINOPHEN 325 MG PO TABS
650.0000 mg | ORAL_TABLET | Freq: Four times a day (QID) | ORAL | Status: DC | PRN
  Filled 2022-07-01: qty 2

## 2022-07-01 MED ORDER — POTASSIUM CHLORIDE CRYS ER 20 MEQ PO TBCR
40.0000 meq | EXTENDED_RELEASE_TABLET | Freq: Two times a day (BID) | ORAL | Status: DC
  Administered 2022-07-01: 40 meq via ORAL
  Filled 2022-07-01: qty 2

## 2022-07-01 MED ORDER — ONDANSETRON HCL 4 MG PO TABS: 4.0000 mg | ORAL_TABLET | Freq: Four times a day (QID) | ORAL | 0 refills | Status: DC | PRN

## 2022-07-01 NOTE — Progress Notes (Signed)
Mobility Specialist - Progress Note   07/01/22 1412  Mobility  Activity Ambulated with assistance in hallway  Level of Assistance Modified independent, requires aide device or extra time  Assistive Device Front wheel walker  Distance Ambulated (ft) 350 ft  Activity Response Tolerated well  Mobility Referral Yes  $Mobility charge 1 Mobility   Pt received in recliner and agreeable to mobility. No complaints during session. Pt to recliner after session with all needs met.    [facility]

## 2022-07-01 NOTE — Progress Notes (Addendum)
Progress Note  Primary GI: Dr. Rhea Belton   Subjective  Chief Complaint: Acute right upper quadrant pain x 3 days/fever/concern for biliary obstruction on CT   No family was present at the time of my evaluation. *Due to language barrier, an interpreter was present during the history-taking and subsequent discussion (and for part of the physical exam) with this patient, [id number]. Patient states she has no abdominal pain, no nausea or vomiting.  Is passing gas has not had a bowel movement since being here but states she has also not eaten. Patient states she is interested in going home.     Objective   Vital signs in last 24 hours: Temp:  [98.2 F (36.8 C)-98.3 F (36.8 C)] 98.2 F (36.8 C) (04/18 0451) Pulse Rate:  [57-59] 59 (04/17 2044) Resp:  [16-18] 18 (04/18 0451) BP: (139-156)/(59-65) 156/65 (04/18 0451) SpO2:  [97 %-99 %] 97 % (04/18 0451) Last BM Date : 06/27/22 Last BM recorded by nurses in past 5 days No data recorded  General:   female in no acute distress  Heart:  Regular rate and rhythm; no murmurs Pulm: Clear anteriorly; no wheezing Abdomen:  Soft, Non-distended AB, Active bowel sounds. No tenderness . Without guarding and Without rebound, No organomegaly appreciated. Extremities:  without  edema. Neurologic:  Alert and  oriented x4;  No focal deficits.  Psych:  Cooperative. Normal mood and affect.  Intake/Output from previous day: 04/17 0701 - 04/18 0700 In: 240 [P.O.:240] Out: -  Intake/Output this shift: No intake/output data recorded.  Studies/Results: DG ERCP  Result Date: 06/29/2022 CLINICAL DATA:  ERCP with stent placement. EXAM: ERCP TECHNIQUE: Multiple spot images obtained with the fluoroscopic device and submitted for interpretation post-procedure. FLUOROSCOPY TIME: FLUOROSCOPY TIME 5 minutes, 35 seconds (26.7 mGy) COMPARISON:  MRCP-06/28/2022; CT abdomen pelvis-06/27/2022; ERCP-03/29/2022 FINDINGS: Thirteen spot fluoroscopic images of the right  upper abdominal quadrant during ERCP are provided for review Initial image demonstrates an ERCP probe overlying the right upper abdominal quadrant. Subsequent images demonstrate selective cannulation and opacification of the common bile duct with occlusion of the CBD just peripheral to the level of the biliary hilum with associated suspected malignant shouldering, similar to findings on preceding abdominal CT Subsequent images demonstrate placement of an internal stent traversing the occlusion of the mid and distal aspects of the CBD. IMPRESSION: ERCP with biliary stent placement for potential malignant occlusion of the CBD. Correlation with the operative report is advised. These images were submitted for radiologic interpretation only. Please see the procedural report for the amount of contrast and the fluoroscopy time utilized. Electronically Signed   By: Simonne Come M.D.   On: 06/29/2022 15:55    Lab Results: Recent Labs    06/29/22 0415 06/30/22 0407 07/01/22 0428  WBC 12.0* 7.8 6.7  HGB 8.7* 9.4* 9.4*  HCT 25.5* 27.7* 27.7*  PLT 226 255 284   BMET Recent Labs    06/29/22 0415 06/30/22 0407 07/01/22 0428  NA 135 130* 134*  K 3.3* 3.5 3.3*  CL 104 102 102  CO2 GLUCOSE 117* 111* 100*  BUN CREATININE 0.49 0.58 0.62  CALCIUM 8.1* 7.8* 7.9*   LFT Recent Labs    07/01/22 0428  PROT 5.9*  ALBUMIN 2.5*  AST 30  ALT 50*  ALKPHOS 261*  BILITOT 0.7   PT/INR Recent Labs    06/28/22 1254  LABPROT 13.8  INR 1.1     Scheduled Meds:  insulin  aspart  0-9 Units Subcutaneous Q4H   levothyroxine  75 mcg Oral Daily   pravastatin  80 mg Oral Daily   Continuous Infusions:    Patient profile:   82 year old non-English-speaking [place] female 3-day history of acute right upper quadrant pain and fever, ERCP 03/2022 suspected ascending cholangitis no definite filling defects evidence of sludge.  Status post partial lap cholecystectomy 03/31/2022 complicated by 2  small hepatic abscesses.  Completed 4-week course of antibiotics on 05/26/2022 .   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  Normocytic anemia Hemoglobin stable 9.4, no evidence of GI bleeding    Principal Problem:   Sepsis Active Problems:   HTN (hypertension)   Hyperlipidemia   Macrocytic anemia   LFT elevation   Hyponatremia   Allergic reaction   Hypomagnesemia   Fever   Bile duct stricture   Biliary obstruction    LOS: 4 days   Doree Albee  07/01/2022, 8:28 AM  GI ATTENDING  Interval history data reviewed.  Patient seen and examined.  Agree with interval progress note as outlined above.  Clinically stable.  No significant pain.  Liver tests are fine.  Awaiting cytology.  Further plans thereafter.  Wilhemina Bonito. Eda Keys., M.D. [facility]

## 2022-07-01 NOTE — Progress Notes (Signed)
PROGRESS NOTE    Lauren [name]  ZOX:[id number] DOB: [birth year] DOA: 06/27/2022 PCP: Pearson Grippe, MD   Brief Narrative:  The patient is an 82 year old [place] speaking female with history of DM2, HLD, hypothyroidism, CKD 2, recurrent UTIs, chronic urinary obstruction requiring self-catheterization comes into the hospital with right upper quadrant pain. This has been going on for the past 3 days, associated with nausea. She apparently had cholecystectomy 3 months ago and her hospitalization has been complicated by liver abscesses, elevated LFTs. She follows with ID as an outpatient and just finished antibiotics last month. She was febrile to 101.6 in the ER but that was after she received Zosyn and had an apparent allergic reaction with shortness of breath, hypoxia, wheezing requiring Solu-Medrol    She was admitted for elevated LFTs, abdominal pain, nausea and vomiting with concern for bili obstruction and GI and general surgery were consulted.  She underwent further workup and had a stenting of her right hepatic duct on ERCP yesterday.  Gastroenterology recommending keeping her in the hospital until cytology results to determine further plans and addressing the indeterminate biliary stricture.  Diet is now being advanced back to a regular diet.  She is doing fairly well and denies any abdominal pain but wants to walk today some mobility specialist is good to work with her today.  She is passing flatus but has not had a bowel movement yet.   Assessment and Plan:  Elevated LFTs, abdominal pain, nausea, vomiting with concern for Biliary Obstruction rule out Cholangiocarcinoma -imaging in the ER with CT scan abdomen pelvis showed increased diffuse dilatation of intrahepatic bile ducts due to 2.5 cm soft tissue mass or lymphadenopathy in the porta hepatis.   -There is concern for cholangiocarcinoma versus metastatic lymphadenopathy.  GI and general surgery were consulted -Has been placed on antibiotics,  continue -MRI/MRCP confirmed intrahepatic biliary dilatation with abrupt transition point in the hepatic hilum.   -Liver Panel Trend: Recent Labs  Lab 06/27/22 1608 06/28/22 0441 06/29/22 0415 06/30/22 0407 07/01/22 0428  AST 95* 53* 32 35 30  ALT 115* 95* 64* 61* 50*  BILITOT 1.1 0.6 0.5 0.5 0.7  ALKPHOS 396* 367* 286* 310* 261*  -She was planned for an ERCP with biliary stenting and brushing yesterday afternoon she was found to have a shelflike stricture of the common hepatic duct with no filling defects and the stricture was brushed multiple times for cytology and she had a stent placed in the right hepatic duct -He was on antibiotics and ID has stopped -Patient wants to go home however GI recommended to keep in the hospital until cytology results given that this would make it easier to expedite any further procedures and/or workup as an inpatient -Diet has been advanced to regular diet and she is tolerating but has not had a bowel movement yet. -Infectious diseases has now discontinued antibiotics given that she is afebrile and no leukocytosis   Hyponatremia  -Likely in the setting of poor p.o. intake given hypochloremia as well.   -Received fluids, sodium normalized yesterday but dropped again -Na+ Trend: Recent Labs  Lab 06/27/22 1608 06/28/22 0441 06/29/22 0415 06/30/22 0407 07/01/22 0428  NA 124* 129* 135 130* 134*  -Continue to Monitor and Trend -Repeat CMP in the AM    Neurogenic Bladder  -Continue self- catheterization.   -She does have evidence of neurogenic bladder on the CT scan   Diverticulosis  -Incidentally noted   Anemia, of chronic disease  -Hgb/Hct Trend: Recent Labs  Lab 06/27/22 1608 06/28/22 0441 06/29/22 0415 06/30/22 0407 07/01/22 0428  HGB 9.8* 10.1* 8.7* 9.4* 9.4*  HCT 28.5* 29.3* 25.5* 27.7* 27.7*  MCV 86.9 87.2 88.2 86.6 87.9  -hemoglobin stable, no bleeding -Continue to monitor for signs and symptoms bleeding; no overt  bleeding -Repeat CBC in the a.m.   History of Hepatic Abscess  -Noted in January, completed antibiotics, seen by ID most recently on 3/13 when her antibiotics were stopped.   -Repeat imaging showed improvement of her abscesses.   -ID following but her hepatic abscesses have resolved there is no indication for continuation of antibiotics they are now being stopped by the infectious disease team   Hyperlipidemia  -Continue Pravastatin 80 mg po Daily    Allergic reaction to Zosyn -In the ER on 4/14, felt not to be true allergic reaction.  She has been placed on Unasyn by ID but now being stopped   Hypokalemia -Patient's K+ Level Trend: Recent Labs  Lab 06/27/22 1608 06/28/22 0441 06/29/22 0415 06/30/22 0407 07/01/22 0428  K 3.9 3.3* 3.3* 3.5 3.3*  -Replete with po Kcl 40 mEQ BID x2 again -Continue to Monitor and Replete as Necessary -Repeat CMP in the AM    Hypomagnesemia -Patient's Mag Level Trend: Recent Labs  Lab 06/27/22 2244 06/28/22 0441 06/29/22 0415 06/30/22 0407 07/01/22 0428  MG 1.5* 2.1 1.7 1.6* 1.9  -Replete with IV Mag Sulfate 2 grams yesterday -Continue to Monitor and Replete as Necessary -Repeat Mag in the AM    Essential Hypertension  -Continue to Monitor Blood Pressure per protocol -If persists high will need resumption of her home Cozaar (50 mg)   DM2, with Hyperglycemia  -High CBGs likely in the setting of the steroid she got in the ER.   -Continue to Hold home Janumet.   -Continue Sensitive Novolog Sliding Scale q4h -CBG trend:  Recent Labs  Lab 06/30/22 1129 06/30/22 1646 06/30/22 2040 07/01/22 0028 07/01/22 0448 07/01/22 0741 07/01/22 1127  GLUCAP 121* 154* 155* 94 93 89 190*   Hypoalbuminemia -Patient's Albumin Trend: Recent Labs  Lab 06/27/22 1608 06/28/22 0441 06/29/22 0415 06/30/22 0407 07/01/22 0428  ALBUMIN 2.8* 2.7* 2.3* 2.4* 2.5*  -Continue to Monitor and Trend and repeat CMP in the AM   DVT prophylaxis: SCDs Start:  06/27/22 2158    Code Status: Full Code Family Communication: No family currently at bedside  Disposition Plan:  Level of care: Progressive Status is: Inpatient Remains inpatient appropriate because: Needs further clinical improvement and clearance by the GI doctors  Consultants:  Gastroenterology Infectious diseases General surgery  Procedures:  As delineated as above  ERCP Findings:      1. The side-viewing duodena scope was passed blindly into the esophagus.       The stomach and duodenum were grossly normal. The major ampulla was       unremarkable. Pressure enterotomy had been performed. The minor ampulla       was not sought.      2. A scout radiograph of the abdomen with the endoscope and position was       unremarkable      3. The common bile duct was freely cannulated on the first attempt.       Injection of contrast yielded a 2 cm in length shelflike stricture of       the common hepatic duct. No filling defects.      4. Blood was emanating from the ampulla along with bile.  5. The stricture was brushed multiple times and submitted for cytologic       analysis.      6. A 10 French 7 cm plastic stent was placed into the right hepatic duct       in good position.      7. Excellent drainage observed. Impression:               1. Indeterminate biliary stricture status post                            brushings and stent placement                           2. Previous ERCP and subtotal cholecystectomy                            January 2024. Moderate Sedation:      none Recommendation:           1. Standard post ERCP care                           2. Advance diet                           3. Continue antibiotics                           4. Follow-up brushings                           5. Monitor liver test medical course                           6.. Further plans pending the outcome of the above.                            May need EUS. May need spyglass  assessment of the                            bile duct. May need both                           Discussed with the patient's son who was provided a                            copy of this report  Antimicrobials:  Anti-infectives (From admission, onward)    Start     Dose/Rate Route Frequency Ordered Stop   06/28/22 2200  vancomycin (VANCOREADY) IVPB 500 mg/100 mL  Status:  Discontinued        500 mg 100 mL/hr over 60 Minutes Intravenous Every 24 hours 06/27/22 2212 06/28/22 1540   06/28/22 2200  Ampicillin-Sulbactam (UNASYN) 3 g in sodium chloride 0.9 % 100 mL IVPB  Status:  Discontinued        3 g 200 mL/hr over 30 Minutes Intravenous Every 6 hours 06/28/22 1540 06/30/22 1202   06/28/22 0600  aztreonam (AZACTAM) 1 g in sodium chloride 0.9 % 100 mL IVPB  Status:  Discontinued        1 g 200 mL/hr over 30 Minutes Intravenous Every 8 hours 06/27/22 2133 06/27/22 2205   06/28/22 0600  aztreonam (AZACTAM) 2 g in sodium chloride 0.9 % 100 mL IVPB  Status:  Discontinued        2 g 200 mL/hr over 30 Minutes Intravenous Every 8 hours 06/27/22 2205 06/28/22 1540   06/27/22 2215  vancomycin (VANCOCIN) IVPB 1000 mg/200 mL premix        1,000 mg 200 mL/hr over 60 Minutes Intravenous  Once 06/27/22 2207 06/28/22 0127   06/27/22 2200  metroNIDAZOLE (FLAGYL) IVPB 500 mg  Status:  Discontinued        500 mg 100 mL/hr over 60 Minutes Intravenous Every 12 hours 06/27/22 2055 06/28/22 1540   06/27/22 2200  aztreonam (AZACTAM) 2 g in sodium chloride 0.9 % 100 mL IVPB        2 g 200 mL/hr over 30 Minutes Intravenous  Once 06/27/22 2138 06/28/22 0021   06/27/22 2100  aztreonam (AZACTAM) 2 g in sodium chloride 0.9 % 100 mL IVPB  Status:  Discontinued        2 g 200 mL/hr over 30 Minutes Intravenous  Once 06/27/22 2055 06/27/22 2133   06/27/22 1630  metroNIDAZOLE (FLAGYL) IVPB 500 mg  Status:  Discontinued        500 mg 100 mL/hr over 60 Minutes Intravenous Every 12 hours 06/27/22 1617 06/27/22 1621    06/27/22 1630  piperacillin-tazobactam (ZOSYN) IVPB 3.375 g        3.375 g 100 mL/hr over 30 Minutes Intravenous  Once 06/27/22 1621 06/27/22 1731       Subjective: Seen and examined at bedside with the assistance of the video translator Erskine Squibb [phone] and the patient was doing fairly well.  Wanted to ambulate and walk.  Had no complaints or discomfort in her abdomen.  States that she is passing flatus but has not had a bowel movement.  Denies any chest pain or shortness of breath.  No other concerns or complaints at this time  Objective: Vitals:   06/30/22 1237 06/30/22 2044 07/01/22 0451 07/01/22 1248  BP: (!) 146/59 139/64 (!) 156/65 (!) 156/62  Pulse: (!) 57 (!) 59  69  Resp: Temp: 98.2 F (36.8 C) 98.3 F (36.8 C) 98.2 F (36.8 C) 98.3 F (36.8 C)  TempSrc: Oral Oral Oral Oral  SpO2: 99% 97% 97% 100%  Weight:      Height:       No intake or output data in the 24 hours ending 07/01/22 1413 Filed Weights   06/27/22 1515  Weight: 47.6 kg   Examination: Physical Exam:  Constitutional: Thin elderly [place] female in no acute distress Respiratory: Diminished to auscultation bilaterally with coarse breath sounds, no wheezing, rales, rhonchi or crackles. Normal respiratory effort and patient is not tachypenic. No accessory muscle use.  Unlabored breathing Cardiovascular: RRR, no murmurs / rubs / gallops. S1 and S2 auscultated. No extremity edema. 2+ pedal pulses. No carotid bruits.  Abdomen: Soft, non-tender, non-distended. Bowel sounds positive.  GU: Deferred. Musculoskeletal: No clubbing / cyanosis of digits/nails. No joint deformity upper and lower extremities.  Skin: No rashes, lesions, ulcers on a skin evaluation. No induration; Warm and dry.  Neurologic: CN 2-12 grossly intact with no focal deficits.  Romberg sign and cerebellar reflexes not assessed.  Psychiatric: Normal judgment and insight. Alert and oriented x 3. Normal mood and  appropriate affect.   Data  Reviewed: I have personally reviewed following labs and imaging studies  CBC: Recent Labs  Lab 06/27/22 1608 06/28/22 0441 06/29/22 0415 06/30/22 0407 07/01/22 0428  WBC 11.8* 8.8 12.0* 7.8 6.7  NEUTROABS 10.0*  --  9.1*  --  2.9  HGB 9.8* 10.1* 8.7* 9.4* 9.4*  HCT 28.5* 29.3* 25.5* 27.7* 27.7*  MCV 86.9 87.2 88.2 86.6 87.9  PLT 234 233 226 255 284   Basic Metabolic Panel: Recent Labs  Lab 06/27/22 1608 06/27/22 2244 06/28/22 0441 06/29/22 0415 06/30/22 0407 07/01/22 0428  NA 124*  --  129* 135 130* 134*  K 3.9  --  3.3* 3.3* 3.5 3.3*  CL 93*  --  98 104 102 102  CO2 22  --  20* 23 23 24   GLUCOSE 242*  --  293* 117* 111* 100*  BUN 11  --  9 10 16 15   CREATININE 0.61  --  0.53 0.49 0.58 0.62  CALCIUM 8.1*  --  8.3* 8.1* 7.8* 7.9*  MG  --  1.5* 2.1 1.7 1.6* 1.9  PHOS  --  3.0 3.0 2.3* 4.6 3.3   GFR: Estimated Creatinine Clearance: 38.3 mL/min (by C-G formula based on SCr of 0.62 mg/dL). Liver Function Tests: Recent Labs  Lab 06/27/22 1608 06/28/22 0441 06/29/22 0415 06/30/22 0407 07/01/22 0428  AST 95* 53* 32 35 30  ALT 115* 95* 64* 61* 50*  ALKPHOS 396* 367* 286* 310* 261*  BILITOT 1.1 0.6 0.5 0.5 0.7  PROT 6.8 6.6 5.8* 5.9* 5.9*  ALBUMIN 2.8* 2.7* 2.3* 2.4* 2.5*   Recent Labs  Lab 06/27/22 1608  LIPASE 51   No results for input(s): "AMMONIA" in the last 168 hours. Coagulation Profile: Recent Labs  Lab 06/27/22 2244 06/28/22 1254  INR 1.0 1.1   Cardiac Enzymes: Recent Labs  Lab 06/27/22 2244  CKTOTAL 9*   BNP (last 3 results) No results for input(s): "PROBNP" in the last 8760 hours. HbA1C: No results for input(s): "HGBA1C" in the last 72 hours. CBG: Recent Labs  Lab 06/30/22 2040 07/01/22 0028 07/01/22 0448 07/01/22 0741 07/01/22 1127  GLUCAP 155* 94 93 89 190*   Lipid Profile: No results for input(s): "CHOL", "HDL", "LDLCALC", "TRIG", "CHOLHDL", "LDLDIRECT" in the last 72 hours. Thyroid Function Tests: No results for input(s):  "TSH", "T4TOTAL", "FREET4", "T3FREE", "THYROIDAB" in the last 72 hours. Anemia Panel: No results for input(s): "VITAMINB12", "FOLATE", "FERRITIN", "TIBC", "IRON", "RETICCTPCT" in the last 72 hours. Sepsis Labs: Recent Labs  Lab 06/27/22 1716 06/27/22 1936 06/27/22 2244  PROCALCITON  --   --  5.58  LATICACIDVEN 2.5* 1.2  --    Recent Results (from the past 240 hour(s))  SARS Coronavirus 2 by RT PCR (hospital order, performed in [facility] hospital lab) *cepheid single result test* Anterior Nasal Swab     Status: None   Collection Time: 06/27/22  4:29 PM   Specimen: Anterior Nasal Swab  Result Value Ref Range Status   SARS Coronavirus 2 by RT PCR NEGATIVE NEGATIVE Final    Comment: (NOTE) SARS-CoV-2 target nucleic acids are NOT DETECTED.  The SARS-CoV-2 RNA is generally detectable in upper and lower respiratory specimens during the acute phase of infection. The lowest concentration of SARS-CoV-2 viral copies this assay can detect is 250 copies / mL. A negative result does not preclude SARS-CoV-2 infection and should not be used as the sole basis for treatment or other patient management decisions.  A negative result may  occur with improper specimen collection / handling, submission of specimen other than nasopharyngeal swab, presence of viral mutation(s) within the areas targeted by this assay, and inadequate number of viral copies (<250 copies / mL). A negative result must be combined with clinical observations, patient history, and epidemiological information.  Fact Sheet for Patients:   RoadLapTop.co.za  Fact Sheet for Healthcare Providers: http://Jarrett-miller.com/  This test is not yet approved or  cleared by the [place] FDA and has been authorized for detection and/or diagnosis of SARS-CoV-2 by FDA under an Emergency Use Authorization (EUA).  This EUA will remain in effect (meaning this test can be used) for the duration of  the COVID-19 declaration under Section 564(b)(1) of the Act, 21 U.S.C. section 360bbb-3(b)(1), unless the authorization is terminated or revoked sooner.  Performed at [facility], [address], [place], [place] [address]   Blood culture (routine x 2)     Status: None (Preliminary result)   Collection Time: 06/27/22  4:45 PM   Specimen: BLOOD RIGHT FOREARM  Result Value Ref Range Status   Specimen Description   Final    BLOOD RIGHT FOREARM Performed at [facility], [address], [place], [place] [address]    Special Requests   Final    BOTTLES DRAWN AEROBIC ONLY Blood Culture adequate volume Performed at [facility], [address], [place], [place] [address]    Culture   Final    NO GROWTH 4 DAYS Performed at [facility], [address] [place], [place] [address]    Report Status PENDING  Incomplete  Blood culture (routine x 2)     Status: None (Preliminary result)   Collection Time: 06/27/22  4:51 PM   Specimen: BLOOD  Result Value Ref Range Status   Specimen Description   Final    BLOOD LEFT ANTECUBITAL Performed at [facility], [address], [place], [place] [address]    Special Requests   Final    BOTTLES DRAWN AEROBIC AND ANAEROBIC Blood Culture adequate volume Performed at [facility], [address], [place], [place] [address]    Culture   Final    NO GROWTH 4 DAYS Performed at [facility], [address], [place], [place] [address]    Report Status PENDING  Incomplete  Respiratory (~20 pathogens) panel by PCR     Status: None   Collection Time: 06/27/22  5:45 PM  Result Value Ref Range Status   Adenovirus NOT DETECTED NOT DETECTED Final   Coronavirus 229E NOT DETECTED NOT DETECTED Final    Comment: (NOTE) The Coronavirus on the Respiratory Panel, DOES NOT test for the novel  Coronavirus (2019 nCoV)    Coronavirus HKU1 NOT DETECTED NOT DETECTED Final    Coronavirus NL63 NOT DETECTED NOT DETECTED Final   Coronavirus OC43 NOT DETECTED NOT DETECTED Final   Metapneumovirus NOT DETECTED NOT DETECTED Final   Rhinovirus / Enterovirus NOT DETECTED NOT DETECTED Final   Influenza A NOT DETECTED NOT DETECTED Final   Influenza B NOT DETECTED NOT DETECTED Final   Parainfluenza Virus 1 NOT DETECTED NOT DETECTED Final   Parainfluenza Virus 2 NOT DETECTED NOT DETECTED Final   Parainfluenza Virus 3 NOT DETECTED NOT DETECTED Final   Parainfluenza Virus 4 NOT DETECTED NOT DETECTED Final   Respiratory Syncytial Virus NOT DETECTED NOT DETECTED Final   Bordetella pertussis NOT DETECTED NOT DETECTED Final   Bordetella Parapertussis NOT DETECTED NOT  DETECTED Final   Chlamydophila pneumoniae NOT DETECTED NOT DETECTED Final   Mycoplasma pneumoniae NOT DETECTED NOT DETECTED Final    Comment: Performed at [facility], [address], [place], [place] [address]     Radiology Studies: DG ERCP  Result Date: 06/29/2022 CLINICAL DATA:  ERCP with stent placement. EXAM: ERCP TECHNIQUE: Multiple spot images obtained with the fluoroscopic device and submitted for interpretation post-procedure. FLUOROSCOPY TIME: FLUOROSCOPY TIME 5 minutes, 35 seconds (26.7 mGy) COMPARISON:  MRCP-06/28/2022; CT abdomen pelvis-06/27/2022; ERCP-03/29/2022 FINDINGS: Thirteen spot fluoroscopic images of the right upper abdominal quadrant during ERCP are provided for review Initial image demonstrates an ERCP probe overlying the right upper abdominal quadrant. Subsequent images demonstrate selective cannulation and opacification of the common bile duct with occlusion of the CBD just peripheral to the level of the biliary hilum with associated suspected malignant shouldering, similar to findings on preceding abdominal CT Subsequent images demonstrate placement of an internal stent traversing the occlusion of the mid and distal aspects of the CBD. IMPRESSION: ERCP with biliary stent placement for  potential malignant occlusion of the CBD. Correlation with the operative report is advised. These images were submitted for radiologic interpretation only. Please see the procedural report for the amount of contrast and the fluoroscopy time utilized. Electronically Signed   By: Simonne Come M.D.   On: 06/29/2022 15:55    Scheduled Meds:  insulin aspart  0-9 Units Subcutaneous Q4H   levothyroxine  75 mcg Oral Daily   potassium chloride  40 mEq Oral BID   pravastatin  80 mg Oral Daily   Continuous Infusions:   LOS: 4 days   Marguerita Merles, DO [facility] Available via Epic secure chat 7am-7pm After these hours, please refer to coverage provider listed on [email or web address] 07/01/2022, 2:13 PM

## 2022-07-01 NOTE — Plan of Care (Signed)
  Problem: Education: Goal: Knowledge of General Education information will improve Description Including pain rating scale, medication(s)/side effects and non-pharmacologic comfort measures Outcome: Progressing   Problem: Health Behavior/Discharge Planning: Goal: Ability to manage health-related needs will improve Outcome: Progressing   

## 2022-07-01 NOTE — Progress Notes (Signed)
GI ATTENDING.  Addendum:  The patient's brush cytology has returned negative for malignancy.  Reactive changes only.  She will be okay for discharge home today.  Gastroenterology will arrange for her to have outpatient advanced endoscopic evaluation with Dr. Meridee Score (whom I have spoken with regarding the case) on Jul 29, 2022.  She will have endoscopic ultrasound as well as spyglass direct evaluation of the bile duct.  Quentin Mulling, GI, PA-C is currently sharing this information with the patient and will communicate with the patient's son.  Thank you,  Wilhemina Bonito. Eda Keys., M.D. [facility] of Gastroenterology

## 2022-07-01 NOTE — Progress Notes (Signed)
Mobility Specialist - Progress Note   07/01/22 1120  Mobility  Activity Ambulated with assistance in hallway  Level of Assistance Modified independent, requires aide device or extra time  Assistive Device Front wheel walker  Distance Ambulated (ft) 350 ft  Activity Response Tolerated well  Mobility Referral Yes  $Mobility charge 1 Mobility   Pt received in bed and agreeable to mobility. C/o headache during ambulation. Nurse notified. Pt to recliner after session with all needs met.    [facility]

## 2022-07-01 NOTE — Discharge Summary (Signed)
Physician Discharge Summary   Patient: Lauren [name] MRN: [id number] DOB: June 08, 1940  Admit date:     06/27/2022  Discharge date: 07/01/22  Discharge Physician: Marguerita Merles, DO   PCP: Pearson Grippe, MD   Recommendations at discharge:   Follow-up with PCP within 1 to 2 weeks and repeat CBC, CMP, mag, Phos within 1 week Follow-up with gastroenterology in the outpatient setting and gastroenterology as an arranged for patient to have an advanced endoscopic evaluation with Dr. Meridee Score Follow-up on the final brush cytology and pathology Follow-up with infectious disease in outpatient setting  Discharge Diagnoses: Principal Problem:   Sepsis Active Problems:   HTN (hypertension)   Hyperlipidemia   Macrocytic anemia   LFT elevation   Hyponatremia   Allergic reaction   Hypomagnesemia   Fever   Biliary stricture   Biliary obstruction  Resolved Problems:   * No resolved [name] problems. Lauren [name] Course: The patient is an 82 year old [place] speaking female with history of DM2, HLD, hypothyroidism, CKD 2, recurrent UTIs, chronic urinary obstruction requiring self-catheterization comes into the [name] with right upper quadrant pain. This has been going on for the past 3 days, associated with nausea. She apparently had cholecystectomy 3 months ago and her hospitalization has been complicated by liver abscesses, elevated LFTs. She follows with ID as an outpatient and just finished antibiotics last month. She was febrile to 101.6 in the ER but that was after she received Zosyn and had an apparent allergic reaction with shortness of breath, hypoxia, wheezing requiring Solu-Medrol    She was admitted for elevated LFTs, abdominal pain, nausea and vomiting with concern for bili obstruction and GI and general surgery were consulted.  She underwent further workup and had a stenting of her right hepatic duct on ERCP yesterday.  Gastroenterology recommending keeping her in the [name] until  cytology results to determine further plans and addressing the indeterminate biliary stricture.  Diet is now being advanced back to a regular diet.   She is doing fairly well and denies any abdominal pain but wants to walk today some mobility specialist is good to work with her today.  She is passing flatus but has not had a bowel movement yet.  Subsequently later on the day that the GI doctor cleared the patient to go home given that her breath cytology came back for malignancy.  She is medically stable for discharge at this time and will need to follow-up with PCP, gastroenterology as well as infectious disease in outpatient setting if necessary    Assessment and Plan:  Elevated LFTs, abdominal pain, nausea, vomiting with concern for Biliary Obstruction rule out Cholangiocarcinoma -imaging in the ER with CT scan abdomen pelvis showed increased diffuse dilatation of intrahepatic bile ducts due to 2.5 cm soft tissue mass or lymphadenopathy in the porta hepatis.   -There is concern for cholangiocarcinoma versus metastatic lymphadenopathy.  GI and general surgery were consulted -Has been placed on antibiotics, continue -MRI/MRCP confirmed intrahepatic biliary dilatation with abrupt transition point in the hepatic hilum.   -Liver Panel Trend: Last Labs         Recent Labs  Lab 06/27/22 1608 06/28/22 0441 06/29/22 0415 06/30/22 0407 07/01/22 0428  AST 95* 53* 32 35 30  ALT 115* 95* 64* 61* 50*  BILITOT 1.1 0.6 0.5 0.5 0.7  ALKPHOS 396* 367* 286* 310* 261*    -She was planned for an ERCP with biliary stenting and brushing yesterday afternoon she was found to have a shelflike  stricture of the common hepatic duct with no filling defects and the stricture was brushed multiple times for cytology and she had a stent placed in the right hepatic duct -He was on antibiotics and ID has stopped -Patient wants to go home however GI recommended to keep in the [name] until cytology results given that  this would make it easier to expedite any further procedures and/or workup as an inpatient: Cytology results were negative for malignancy and now GI has cleared the patient for discharge -Diet has been advanced to regular diet and she is tolerating but has not had a bowel movement yet. -Infectious diseases has now discontinued antibiotics given that she is afebrile and no leukocytosis   Hyponatremia  -Likely in the setting of poor p.o. intake given hypochloremia as well.   -Received fluids, sodium normalized yesterday but dropped again -Na+ Trend: Last Labs         Recent Labs  Lab 06/27/22 1608 06/28/22 0441 06/29/22 0415 06/30/22 0407 07/01/22 0428  NA 124* 129* 135 130* 134*    -Continue to Monitor and Trend -Repeat CMP in the AM    Neurogenic Bladder  -Continue self- catheterization.   -She does have evidence of neurogenic bladder on the CT scan   Diverticulosis  -Incidentally noted   Anemia, of chronic disease  -Hgb/Hct Trend: Last Labs         Recent Labs  Lab 06/27/22 1608 06/28/22 0441 06/29/22 0415 06/30/22 0407 07/01/22 0428  HGB 9.8* 10.1* 8.7* 9.4* 9.4*  HCT 28.5* 29.3* 25.5* 27.7* 27.7*  MCV 86.9 87.2 88.2 86.6 87.9    -hemoglobin stable, no bleeding -Continue to monitor for signs and symptoms bleeding; no overt bleeding -Repeat CBC in the a.m.   History of Hepatic Abscess  -Noted in January, completed antibiotics, seen by ID most recently on 3/13 when her antibiotics were stopped.   -Repeat imaging showed improvement of her abscesses.   -ID following but her hepatic abscesses have resolved there is no indication for continuation of antibiotics they are now being stopped by the infectious disease team   Hyperlipidemia  -Continue Pravastatin 80 mg po Daily    Allergic reaction to Zosyn -In the ER on 4/14, felt not to be true allergic reaction.  She has been placed on Unasyn by ID but now being stopped   Hypokalemia -Patient's K+ Level  Trend: Last Labs         Recent Labs  Lab 06/27/22 1608 06/28/22 0441 06/29/22 0415 06/30/22 0407 07/01/22 0428  K 3.9 3.3* 3.3* 3.5 3.3*    -Replete with po Kcl 40 mEQ BID x2 again -Continue to Monitor and Replete as Necessary -Repeat CMP in the AM    Hypomagnesemia -Patient's Mag Level Trend: Last Labs         Recent Labs  Lab 06/27/22 2244 06/28/22 0441 06/29/22 0415 06/30/22 0407 07/01/22 0428  MG 1.5* 2.1 1.7 1.6* 1.9    -Replete with IV Mag Sulfate 2 grams yesterday -Continue to Monitor and Replete as Necessary -Repeat Mag in the AM    Essential Hypertension  -Continue to Monitor Blood Pressure per protocol -If persists high will need resumption of her home Cozaar (50 mg)   DM2, with Hyperglycemia  -High CBGs likely in the setting of the steroid she got in the ER.   -Continue to Hold home Janumet.   -Continue Sensitive Novolog Sliding Scale q4h -CBG trend:  Last Labs  Recent Labs  Lab 06/30/22 1129 06/30/22 1646 06/30/22 2040 07/01/22 0028 07/01/22 0448 07/01/22 0741 07/01/22 1127  GLUCAP 121* 154* 155* 94 93 89 190*      Hypoalbuminemia -Patient's Albumin Trend: Last Labs         Recent Labs  Lab 06/27/22 1608 06/28/22 0441 06/29/22 0415 06/30/22 0407 07/01/22 0428  ALBUMIN 2.8* 2.7* 2.3* 2.4* 2.5*    -Continue to Monitor and Trend and repeat CMP in the AM    Consultants: Gastroenterology, infectious diseases, general surgery Procedures performed: As delineated as above as well as an ERCP ERCP Findings:      1. The side-viewing duodena scope was passed blindly into the esophagus.       The stomach and duodenum were grossly normal. The major ampulla was       unremarkable. Pressure enterotomy had been performed. The minor ampulla       was not sought.      2. A scout radiograph of the abdomen with the endoscope and position was       unremarkable      3. The common bile duct was freely cannulated on the first attempt.        Injection of contrast yielded a 2 cm in length shelflike stricture of       the common hepatic duct. No filling defects.      4. Blood was emanating from the ampulla along with bile.      5. The stricture was brushed multiple times and submitted for cytologic       analysis.      6. A 10 French 7 cm plastic stent was placed into the right hepatic duct       in good position.      7. Excellent drainage observed. Impression:               1. Indeterminate biliary stricture status post                            brushings and stent placement                           2. Previous ERCP and subtotal cholecystectomy                            January 2024. Moderate Sedation:      none Recommendation:           1. Standard post ERCP care                           2. Advance diet                           3. Continue antibiotics                           4. Follow-up brushings                           5. Monitor liver test medical course                           6.. Further plans pending the outcome of the  above.                            May need EUS. May need spyglass assessment of the                            bile duct. May need both                           Discussed with the patient's son who was provided a                            copy of this report    Disposition: Home Diet recommendation:  Discharge Diet Orders (From admission, onward)     Start     Ordered   07/01/22 0000  Diet - low sodium heart healthy        07/01/22 1616   07/01/22 0000  Diet Carb Modified        07/01/22 1616           Dysphagia type 3 Thin Liquid  DISCHARGE MEDICATION: Allergies as of 07/01/2022       Reactions   Phenazopyridine Anaphylaxis   Pyridium [phenazopyridine Hcl] Other (See Comments)   Hemolytic anemia   Sulfamethoxazole-trimethoprim Anaphylaxis   Sulfa Antibiotics    Potential allergy: oxidizing drug: methemoglobinemia/hemolysis on pyridium   Ciprofloxacin Hcl    Sx's  almost like a heart attack   Esomeprazole Magnesium Nausea Only   Pain and nausea   Ferrous Sulfate Other (See Comments)   Macrodantin [nitrofurantoin Macrocrystal] Other (See Comments)   GI Upset   Pantoprazole Sodium Nausea Only   abd pain and nausea   Promethazine Diarrhea   Tramadol Nausea Only        Medication List     TAKE these medications    Accu-Chek Aviva Plus test strip Generic drug: glucose blood Use to check blood sugar 3 times daily.   acetaminophen 500 MG tablet Commonly known as: TYLENOL Take 1,000 mg by mouth every 6 (six) hours as needed for mild pain.   FISH OIL PO Take 1 tablet by mouth daily.   levothyroxine 75 MCG tablet Commonly known as: SYNTHROID Take 75 mcg by mouth daily.   losartan 50 MG tablet Commonly known as: COZAAR Take 50 mg by mouth daily.   meclizine 25 MG tablet Commonly known as: ANTIVERT Take 25 mg by mouth every 12 (twelve) hours as needed for dizziness.   omeprazole 40 MG capsule Commonly known as: PRILOSEC Take 1 capsule (40 mg total) by mouth daily. What changed: when to take this   ondansetron 4 MG tablet Commonly known as: ZOFRAN Take 1 tablet (4 mg total) by mouth every 6 (six) hours as needed for nausea.   pravastatin 80 MG tablet Commonly known as: PRAVACHOL Take 80 mg by mouth daily.   sitaGLIPtin-metformin 50-500 MG tablet Commonly known as: JANUMET Take 1 tablet by mouth 2 (two) times daily with a meal.   VITAMIN D PO Take 1 tablet by mouth daily.       Discharge Exam: Filed Weights   06/27/22 1515  Weight: 47.6 kg   Vitals:   07/01/22 0451 07/01/22 1248  BP: (!) 156/65 (!) 156/62  Pulse:  69  Resp: 18 16  Temp: 98.2 F (36.8 C) 98.3 F (36.8  C)  SpO2: 97% 100%   Examination: Physical Exam:   Constitutional: Thin elderly [place] female in no acute distress Respiratory: Diminished to auscultation bilaterally with coarse breath sounds, no wheezing, rales, rhonchi or crackles. Normal  respiratory effort and patient is not tachypenic. No accessory muscle use.  Unlabored breathing Cardiovascular: RRR, no murmurs / rubs / gallops. S1 and S2 auscultated. No extremity edema. 2+ pedal pulses. No carotid bruits.  Abdomen: Soft, non-tender, non-distended. Bowel sounds positive.  GU: Deferred. Musculoskeletal: No clubbing / cyanosis of digits/nails. No joint deformity upper and lower extremities.  Skin: No rashes, lesions, ulcers on a skin evaluation. No induration; Warm and dry.  Neurologic: CN 2-12 grossly intact with no focal deficits.  Romberg sign and cerebellar reflexes not assessed.  Psychiatric: Normal judgment and insight. Alert and oriented x 3. Normal mood and appropriate affect.  Condition at discharge: stable  The results of significant diagnostics from this hospitalization (including imaging, microbiology, ancillary and laboratory) are listed below for reference.   Imaging Studies: DG ERCP  Result Date: 06/29/2022 CLINICAL DATA:  ERCP with stent placement. EXAM: ERCP TECHNIQUE: Multiple spot images obtained with the fluoroscopic device and submitted for interpretation post-procedure. FLUOROSCOPY TIME: FLUOROSCOPY TIME 5 minutes, 35 seconds (26.7 mGy) COMPARISON:  MRCP-06/28/2022; CT abdomen pelvis-06/27/2022; ERCP-03/29/2022 FINDINGS: Thirteen spot fluoroscopic images of the right upper abdominal quadrant during ERCP are provided for review Initial image demonstrates an ERCP probe overlying the right upper abdominal quadrant. Subsequent images demonstrate selective cannulation and opacification of the common bile duct with occlusion of the CBD just peripheral to the level of the biliary hilum with associated suspected malignant shouldering, similar to findings on preceding abdominal CT Subsequent images demonstrate placement of an internal stent traversing the occlusion of the mid and distal aspects of the CBD. IMPRESSION: ERCP with biliary stent placement for potential  malignant occlusion of the CBD. Correlation with the operative report is advised. These images were submitted for radiologic interpretation only. Please see the procedural report for the amount of contrast and the fluoroscopy time utilized. Electronically Signed   By: Simonne Come M.D.   On: 06/29/2022 15:55   MR ABDOMEN MRCP W WO CONTAST  Result Date: 06/28/2022 CLINICAL DATA:  Abdominal pain with fever and leukocytosis status post laparoscopic partial cholecystectomy 03/31/2022 complicated by hepatic abscesses. Concern for biliary obstruction on recent CT. EXAM: MRI ABDOMEN WITHOUT AND WITH CONTRAST (INCLUDING MRCP) TECHNIQUE: Multiplanar multisequence MR imaging of the abdomen was performed both before and after the administration of intravenous contrast. Heavily T2-weighted images of the biliary and pancreatic ducts were obtained, and three-dimensional MRCP images were rendered by post processing. CONTRAST:  4.96mL GADAVIST GADOBUTROL 1 MMOL/ML IV SOLN COMPARISON:  Abdominopelvic CT 06/27/2022. Abdominal MRI 04/05/2022. FINDINGS: Despite efforts by the technologist and patient, mild motion artifact is present on today's exam and could not be eliminated. This reduces exam sensitivity and specificity. Lower chest:  Trace bilateral pleural effusions. Hepatobiliary: No significant hepatic steatosis or morphologic changes of cirrhosis are demonstrated. The previously demonstrated subcapsular abscesses in the right hepatic lobe have nearly resolved status post cholecystectomy. As seen on recent CT, there is new moderate diffuse intrahepatic biliary dilatation with abrupt transition point in the hepatic hilum. As suggested on CT, the there is a possible enhancing mass in the porta hepatis causing the biliary obstruction, measuring approximately 1.5 x 1.3 cm on image 31/19. The common bile duct appears decompressed. Pancreas: No pancreatic ductal dilatation, mass lesion or surrounding inflammation. Spleen: Normal in  size without focal abnormality. Adrenals/Urinary Tract: Both adrenal glands appear normal. The kidneys appear unremarkable, without evidence of hydronephrosis or perinephric soft tissue stranding. Stomach/Bowel: The stomach appears unremarkable for its degree of distension. No evidence of bowel wall thickening, distention or surrounding inflammatory change. Vascular/Lymphatic: Aside from the enhancing lesion in the porta hepatis, no enlarged abdominal lymph nodes are identified. Diffuse aortic and branch vessel atherosclerosis, better seen on CT. No evidence of aneurysm or large vessel occlusion. The portal, superior mesenteric and splenic veins are patent. Other: Trace ascites.  No evidence of peritoneal nodularity. Musculoskeletal: No acute or significant osseous findings. IMPRESSION: 1. New moderate diffuse intrahepatic biliary dilatation with abrupt transition point in the hepatic hilum. As suggested on recent CT, there is a possible enhancing mass in the porta hepatis causing the biliary obstruction. Findings remain suspicious for cholangiocarcinoma. Recommend ERCP with biliary stenting and brushing. 2. The previously demonstrated subcapsular abscesses in the right hepatic lobe have nearly resolved status post cholecystectomy. 3. No evidence of pancreatic ductal dilatation or pancreatic mass. 4. Trace ascites and trace bilateral pleural effusions. 5.  Aortic Atherosclerosis (ICD10-I70.0). Electronically Signed   By: Carey Bullocks M.D.   On: 06/28/2022 12:43   MR 3D Recon At Scanner  Result Date: 06/28/2022 CLINICAL DATA:  Abdominal pain with fever and leukocytosis status post laparoscopic partial cholecystectomy 03/31/2022 complicated by hepatic abscesses. Concern for biliary obstruction on recent CT. EXAM: MRI ABDOMEN WITHOUT AND WITH CONTRAST (INCLUDING MRCP) TECHNIQUE: Multiplanar multisequence MR imaging of the abdomen was performed both before and after the administration of intravenous contrast.  Heavily T2-weighted images of the biliary and pancreatic ducts were obtained, and three-dimensional MRCP images were rendered by post processing. CONTRAST:  4.30mL GADAVIST GADOBUTROL 1 MMOL/ML IV SOLN COMPARISON:  Abdominopelvic CT 06/27/2022. Abdominal MRI 04/05/2022. FINDINGS: Despite efforts by the technologist and patient, mild motion artifact is present on today's exam and could not be eliminated. This reduces exam sensitivity and specificity. Lower chest:  Trace bilateral pleural effusions. Hepatobiliary: No significant hepatic steatosis or morphologic changes of cirrhosis are demonstrated. The previously demonstrated subcapsular abscesses in the right hepatic lobe have nearly resolved status post cholecystectomy. As seen on recent CT, there is new moderate diffuse intrahepatic biliary dilatation with abrupt transition point in the hepatic hilum. As suggested on CT, the there is a possible enhancing mass in the porta hepatis causing the biliary obstruction, measuring approximately 1.5 x 1.3 cm on image 31/19. The common bile duct appears decompressed. Pancreas: No pancreatic ductal dilatation, mass lesion or surrounding inflammation. Spleen: Normal in size without focal abnormality. Adrenals/Urinary Tract: Both adrenal glands appear normal. The kidneys appear unremarkable, without evidence of hydronephrosis or perinephric soft tissue stranding. Stomach/Bowel: The stomach appears unremarkable for its degree of distension. No evidence of bowel wall thickening, distention or surrounding inflammatory change. Vascular/Lymphatic: Aside from the enhancing lesion in the porta hepatis, no enlarged abdominal lymph nodes are identified. Diffuse aortic and branch vessel atherosclerosis, better seen on CT. No evidence of aneurysm or large vessel occlusion. The portal, superior mesenteric and splenic veins are patent. Other: Trace ascites.  No evidence of peritoneal nodularity. Musculoskeletal: No acute or significant  osseous findings. IMPRESSION: 1. New moderate diffuse intrahepatic biliary dilatation with abrupt transition point in the hepatic hilum. As suggested on recent CT, there is a possible enhancing mass in the porta hepatis causing the biliary obstruction. Findings remain suspicious for cholangiocarcinoma. Recommend ERCP with biliary stenting and brushing. 2. The previously demonstrated subcapsular abscesses in the  right hepatic lobe have nearly resolved status post cholecystectomy. 3. No evidence of pancreatic ductal dilatation or pancreatic mass. 4. Trace ascites and trace bilateral pleural effusions. 5.  Aortic Atherosclerosis (ICD10-I70.0). Electronically Signed   By: Carey Bullocks M.D.   On: 06/28/2022 12:43   CT ABDOMEN PELVIS W CONTRAST  Result Date: 06/27/2022 CLINICAL DATA:  Abdominal pain for 3 days. Leukocytosis. 3 months postop from cholecystectomy. EXAM: CT ABDOMEN AND PELVIS WITH CONTRAST TECHNIQUE: Multidetector CT imaging of the abdomen and pelvis was performed using the standard protocol following bolus administration of intravenous contrast. RADIATION DOSE REDUCTION: This exam was performed according to the departmental dose-optimization program which includes automated exposure control, adjustment of the mA and/or kV according to patient size and/or use of iterative reconstruction technique. CONTRAST:  75mL OMNIPAQUE IOHEXOL 350 MG/ML SOLN COMPARISON:  05/17/2022 FINDINGS: Lower Chest: No acute findings. Hepatobiliary: No hepatic masses identified. Increased diffuse dilatation of intrahepatic bile ducts is seen. Increased soft tissue mass or lymphadenopathy seen in the porta hepatis measuring 2.1 x 1.8 cm, which appears to obstruct the common bile duct. Pancreas: No mass or inflammatory changes. No evidence of pancreatic ductal dilatation. Spleen: Within normal limits in size and appearance. Adrenals/Urinary Tract: No suspicious masses identified. No evidence of ureteral calculi or  hydronephrosis. Stable distended urinary bladder with mild diffuse wall thickening. Stomach/Bowel: No evidence of obstruction, inflammatory process or abnormal fluid collections. Normal appendix visualized. Mild diverticulosis seen involving the ascending colon, without evidence of diverticulitis. Vascular/Lymphatic: Except for enlarged lymph node or mass in the porta hepatis described above, no other sites of lymphadenopathy identified. No acute vascular findings. Aortic atherosclerotic calcification incidentally noted. Reproductive:  No mass or other significant abnormality. Other:  None. Musculoskeletal:  No suspicious bone lesions identified. IMPRESSION: Increased diffuse dilatation of intrahepatic bile ducts due to 2.1 cm soft tissue mass or lymphadenopathy in the porta hepatis. Differential diagnosis includes cholangiocarcinoma and metastatic lymphadenopathy. Consider ERCP for further evaluation. Stable diffuse bladder wall thickening wall thickening, which may indicate chronic cystitis or neurogenic bladder. Right colonic diverticulosis, without radiographic evidence of diverticulitis. Aortic Atherosclerosis (ICD10-I70.0). Electronically Signed   By: Danae Orleans M.D.   On: 06/27/2022 19:16   CT Angio Chest PE W and/or Wo Contrast  Result Date: 06/27/2022 CLINICAL DATA:  Chest pain in dyspnea for several days. High probability for pulmonary embolism. EXAM: CT ANGIOGRAPHY CHEST WITH CONTRAST TECHNIQUE: Multidetector CT imaging of the chest was performed using the standard protocol during bolus administration of intravenous contrast. Multiplanar CT image reconstructions and MIPs were obtained to evaluate the vascular anatomy. RADIATION DOSE REDUCTION: This exam was performed according to the departmental dose-optimization program which includes automated exposure control, adjustment of the mA and/or kV according to patient size and/or use of iterative reconstruction technique. CONTRAST:  75mL OMNIPAQUE  IOHEXOL 350 MG/ML SOLN COMPARISON:  01/26/2023 FINDINGS: Cardiovascular: Satisfactory opacification of pulmonary arteries noted, and no pulmonary emboli identified. No evidence of thoracic aortic dissection or aneurysm. Aortic and coronary atherosclerotic calcification incidentally noted. Mediastinum/Nodes: No masses or pathologically enlarged lymph nodes identified. Calcified mediastinal lymph nodes are consistent with old granulomatous disease. Lungs/Pleura: No pulmonary mass, infiltrate, or effusion. Upper abdomen: No acute findings. Musculoskeletal: No suspicious bone lesions identified. Review of the MIP images confirms the above findings. IMPRESSION: No evidence of pulmonary embolism or other active disease within the thorax. Aortic Atherosclerosis (ICD10-I70.0). Electronically Signed   By: Danae Orleans M.D.   On: 06/27/2022 19:05   DG Chest [name]  Result Date: 06/27/2022 CLINICAL DATA:  Sudden onset of shortness of breath. EXAM: PORTABLE CHEST 1 VIEW COMPARISON:  Radiograph earlier today, 1 hour ago FINDINGS: The cardiomediastinal contours are normal. Unchanged blunting of left costophrenic angle. No developing airspace disease, pulmonary edema or pneumothorax. No acute osseous abnormalities are seen. IMPRESSION: Stable radiographic appearance of the chest, no interval change. Electronically Signed   By: Narda Rutherford M.D.   On: 06/27/2022 18:03   DG Chest Port 1 View  Result Date: 06/27/2022 CLINICAL DATA:  RIGHT upper quadrant pain. EXAM: PORTABLE CHEST 1 VIEW COMPARISON:  Chest x-ray dated 04/04/2022 FINDINGS: Lungs are now clear. No pleural effusion or pneumothorax is seen. Heart size and mediastinal contours are within normal limits. Osseous structures about the chest are unremarkable. IMPRESSION: No active disease. Lungs are now clear. Electronically Signed   By: Bary Richard M.D.   On: 06/27/2022 16:52    Microbiology: Results for orders placed or performed during the [name]  encounter of 06/27/22  SARS Coronavirus 2 by RT PCR ([name] order, performed in [name] lab) *cepheid single result test* Anterior Nasal Swab     Status: None   Collection Time: 06/27/22  4:29 PM   Specimen: Anterior Nasal Swab  Result Value Ref Range Status   SARS Coronavirus 2 by RT PCR NEGATIVE NEGATIVE Final    Comment: (NOTE) SARS-CoV-2 target nucleic acids are NOT DETECTED.  The SARS-CoV-2 RNA is generally detectable in upper and lower respiratory specimens during the acute phase of infection. The lowest concentration of SARS-CoV-2 viral copies this assay can detect is 250 copies / mL. A negative result does not preclude SARS-CoV-2 infection and should not be used as the sole basis for treatment or other patient management decisions.  A negative result may occur with improper specimen collection / handling, submission of specimen other than nasopharyngeal swab, presence of viral mutation(s) within the areas targeted by this assay, and inadequate number of viral copies (<250 copies / mL). A negative result must be combined with clinical observations, patient history, and epidemiological information.  Fact Sheet for Patients:   RoadLapTop.co.za  Fact Sheet for Healthcare Providers: http://Zech-miller.com/  This test is not yet approved or  cleared by the [place] FDA and has been authorized for detection and/or diagnosis of SARS-CoV-2 by FDA under an Emergency Use Authorization (EUA).  This EUA will remain in effect (meaning this test can be used) for the duration of the COVID-19 declaration under Section 564(b)(1) of the Act, 21 U.S.C. section 360bbb-3(b)(1), unless the authorization is terminated or revoked sooner.  Performed at [facility], [address], [place], [place] [address]   Blood culture (routine x 2)     Status: None (Preliminary result)   Collection Time: 06/27/22  4:45 PM    Specimen: BLOOD RIGHT FOREARM  Result Value Ref Range Status   Specimen Description   Final    BLOOD RIGHT FOREARM Performed at [facility], [address], [place], [place] [address]    Special Requests   Final    BOTTLES DRAWN AEROBIC ONLY Blood Culture adequate volume Performed at [facility], [address] [place], [place] [address]    Culture   Final    NO GROWTH 4 DAYS Performed at [facility], [address], [place], [place] [address]    Report Status PENDING  Incomplete  Blood culture (routine x 2)     Status: None (Preliminary result)  Collection Time: 06/27/22  4:51 PM   Specimen: BLOOD  Result Value Ref Range Status   Specimen Description   Final    BLOOD LEFT ANTECUBITAL Performed at [facility], [address] [place], [place] [address]    Special Requests   Final    BOTTLES DRAWN AEROBIC AND ANAEROBIC Blood Culture adequate volume Performed at [name], [address], [place], [place] [address]    Culture   Final    NO GROWTH 4 DAYS Performed at [facility], [address] [place], [place] [address]    Report Status PENDING  Incomplete  Respiratory (~20 pathogens) panel by PCR     Status: None   Collection Time: 06/27/22  5:45 PM  Result Value Ref Range Status   Adenovirus NOT DETECTED NOT DETECTED Final   Coronavirus 229E NOT DETECTED NOT DETECTED Final    Comment: (NOTE) The Coronavirus on the Respiratory Panel, DOES NOT test for the novel  Coronavirus (2019 nCoV)    Coronavirus HKU1 NOT DETECTED NOT DETECTED Final   Coronavirus NL63 NOT DETECTED NOT DETECTED Final   Coronavirus OC43 NOT DETECTED NOT DETECTED Final   Metapneumovirus NOT DETECTED NOT DETECTED Final   Rhinovirus / Enterovirus NOT DETECTED NOT DETECTED Final   Influenza A NOT DETECTED NOT DETECTED Final   Influenza B NOT DETECTED NOT DETECTED Final   Parainfluenza Virus 1 NOT DETECTED NOT DETECTED  Final   Parainfluenza Virus 2 NOT DETECTED NOT DETECTED Final   Parainfluenza Virus 3 NOT DETECTED NOT DETECTED Final   Parainfluenza Virus 4 NOT DETECTED NOT DETECTED Final   Respiratory Syncytial Virus NOT DETECTED NOT DETECTED Final   Bordetella pertussis NOT DETECTED NOT DETECTED Final   Bordetella Parapertussis NOT DETECTED NOT DETECTED Final   Chlamydophila pneumoniae NOT DETECTED NOT DETECTED Final   Mycoplasma pneumoniae NOT DETECTED NOT DETECTED Final    Comment: Performed at [facility], [address], [place], [place] [address]    Labs: CBC: Recent Labs  Lab 06/27/22 1608 06/28/22 0441 06/29/22 0415 06/30/22 0407 07/01/22 0428  WBC 11.8* 8.8 12.0* 7.8 6.7  NEUTROABS 10.0*  --  9.1*  --  2.9  HGB 9.8* 10.1* 8.7* 9.4* 9.4*  HCT 28.5* 29.3* 25.5* 27.7* 27.7*  MCV 86.9 87.2 88.2 86.6 87.9  PLT 234 233 226 255 284   Basic Metabolic Panel: Recent Labs  Lab 06/27/22 1608 06/27/22 2244 06/28/22 0441 06/29/22 0415 06/30/22 0407 07/01/22 0428  NA 124*  --  129* 135 130* 134*  K 3.9  --  3.3* 3.3* 3.5 3.3*  CL 93*  --  98 104 102 102  CO2 22  --  20* 23 23 24   GLUCOSE 242*  --  293* 117* 111* 100*  BUN 11  --  9 10 16 15   CREATININE 0.61  --  0.53 0.49 0.58 0.62  CALCIUM 8.1*  --  8.3* 8.1* 7.8* 7.9*  MG  --  1.5* 2.1 1.7 1.6* 1.9  PHOS  --  3.0 3.0 2.3* 4.6 3.3   Liver Function Tests: Recent Labs  Lab 06/27/22 1608 06/28/22 0441 06/29/22 0415 06/30/22 0407 07/01/22 0428  AST 95* 53* 32 35 30  ALT 115* 95* 64* 61* 50*  ALKPHOS 396* 367* 286* 310* 261*  BILITOT 1.1 0.6 0.5 0.5 0.7  PROT 6.8 6.6 5.8* 5.9* 5.9*  ALBUMIN 2.8* 2.7* 2.3* 2.4* 2.5*   CBG: Recent Labs  Lab 07/01/22 0028 07/01/22  0448 07/01/22 0741 07/01/22 1127 07/01/22 1554  GLUCAP 94 93 89 190* 156*   Discharge time spent: greater than 30 minutes.  Signed: Marguerita Merles, DO [facility] 07/01/2022

## 2022-07-02 LAB — CULTURE, BLOOD (ROUTINE X 2)
Culture: NO GROWTH
Culture: NO GROWTH
Special Requests: ADEQUATE
Special Requests: ADEQUATE

## 2022-07-04 ENCOUNTER — Encounter (HOSPITAL_COMMUNITY): Payer: Self-pay | Admitting: Internal Medicine

## 2022-07-04 LAB — CANCER ANTIGEN 19-9: CA 19-9: 1295 U/mL — ABNORMAL HIGH (ref 0–35)

## 2022-07-06 ENCOUNTER — Other Ambulatory Visit: Payer: Self-pay

## 2022-07-06 DIAGNOSIS — K810 Acute cholecystitis: Secondary | ICD-10-CM

## 2022-07-06 DIAGNOSIS — K831 Obstruction of bile duct: Secondary | ICD-10-CM

## 2022-07-08 DIAGNOSIS — R339 Retention of urine, unspecified: Secondary | ICD-10-CM | POA: Diagnosis not present

## 2022-07-22 ENCOUNTER — Encounter (HOSPITAL_COMMUNITY): Payer: Self-pay | Admitting: Gastroenterology

## 2022-07-22 NOTE — Progress Notes (Signed)
Attempted to obtain medical history via telephone, unable to reach at this time. HIPAA compliant voicemail message left requesting return call to pre surgical testing department. 

## 2022-07-29 ENCOUNTER — Ambulatory Visit (HOSPITAL_COMMUNITY): Payer: Medicare Other | Admitting: Anesthesiology

## 2022-07-29 ENCOUNTER — Other Ambulatory Visit: Payer: Self-pay

## 2022-07-29 ENCOUNTER — Ambulatory Visit (HOSPITAL_COMMUNITY): Payer: Medicare Other

## 2022-07-29 ENCOUNTER — Ambulatory Visit (HOSPITAL_COMMUNITY)
Admission: RE | Admit: 2022-07-29 | Discharge: 2022-07-29 | Disposition: A | Discharge status: Home / Self Care | Payer: Medicare Other | Attending: Gastroenterology | Admitting: Gastroenterology

## 2022-07-29 ENCOUNTER — Ambulatory Visit (HOSPITAL_BASED_OUTPATIENT_CLINIC_OR_DEPARTMENT_OTHER): Payer: Medicare Other | Admitting: Anesthesiology

## 2022-07-29 ENCOUNTER — Encounter (HOSPITAL_COMMUNITY): Payer: Self-pay | Admitting: Gastroenterology

## 2022-07-29 ENCOUNTER — Encounter (HOSPITAL_COMMUNITY)
Admission: RE | Disposition: A | Discharge status: Home / Self Care | Payer: Self-pay | Source: Home / Self Care | Attending: Gastroenterology

## 2022-07-29 DIAGNOSIS — K831 Obstruction of bile duct: Secondary | ICD-10-CM | POA: Insufficient documentation

## 2022-07-29 DIAGNOSIS — K219 Gastro-esophageal reflux disease without esophagitis: Secondary | ICD-10-CM | POA: Insufficient documentation

## 2022-07-29 DIAGNOSIS — I10 Essential (primary) hypertension: Secondary | ICD-10-CM

## 2022-07-29 DIAGNOSIS — M1990 Unspecified osteoarthritis, unspecified site: Secondary | ICD-10-CM | POA: Insufficient documentation

## 2022-07-29 DIAGNOSIS — K812 Acute cholecystitis with chronic cholecystitis: Secondary | ICD-10-CM | POA: Diagnosis not present

## 2022-07-29 DIAGNOSIS — E785 Hyperlipidemia, unspecified: Secondary | ICD-10-CM | POA: Insufficient documentation

## 2022-07-29 DIAGNOSIS — I899 Noninfective disorder of lymphatic vessels and lymph nodes, unspecified: Secondary | ICD-10-CM | POA: Diagnosis not present

## 2022-07-29 DIAGNOSIS — K838 Other specified diseases of biliary tract: Secondary | ICD-10-CM | POA: Diagnosis not present

## 2022-07-29 DIAGNOSIS — Z9689 Presence of other specified functional implants: Secondary | ICD-10-CM

## 2022-07-29 DIAGNOSIS — K810 Acute cholecystitis: Secondary | ICD-10-CM

## 2022-07-29 DIAGNOSIS — M810 Age-related osteoporosis without current pathological fracture: Secondary | ICD-10-CM | POA: Insufficient documentation

## 2022-07-29 DIAGNOSIS — R932 Abnormal findings on diagnostic imaging of liver and biliary tract: Secondary | ICD-10-CM

## 2022-07-29 DIAGNOSIS — E119 Type 2 diabetes mellitus without complications: Secondary | ICD-10-CM | POA: Insufficient documentation

## 2022-07-29 DIAGNOSIS — C772 Secondary and unspecified malignant neoplasm of intra-abdominal lymph nodes: Secondary | ICD-10-CM | POA: Insufficient documentation

## 2022-07-29 DIAGNOSIS — R935 Abnormal findings on diagnostic imaging of other abdominal regions, including retroperitoneum: Secondary | ICD-10-CM | POA: Diagnosis not present

## 2022-07-29 DIAGNOSIS — C240 Malignant neoplasm of extrahepatic bile duct: Secondary | ICD-10-CM | POA: Diagnosis not present

## 2022-07-29 DIAGNOSIS — R339 Retention of urine, unspecified: Secondary | ICD-10-CM | POA: Diagnosis not present

## 2022-07-29 DIAGNOSIS — K759 Inflammatory liver disease, unspecified: Secondary | ICD-10-CM | POA: Insufficient documentation

## 2022-07-29 DIAGNOSIS — Z4659 Encounter for fitting and adjustment of other gastrointestinal appliance and device: Secondary | ICD-10-CM

## 2022-07-29 DIAGNOSIS — R7989 Other specified abnormal findings of blood chemistry: Secondary | ICD-10-CM | POA: Diagnosis not present

## 2022-07-29 DIAGNOSIS — C801 Malignant (primary) neoplasm, unspecified: Secondary | ICD-10-CM | POA: Diagnosis not present

## 2022-07-29 DIAGNOSIS — K589 Irritable bowel syndrome without diarrhea: Secondary | ICD-10-CM | POA: Diagnosis not present

## 2022-07-29 DIAGNOSIS — E039 Hypothyroidism, unspecified: Secondary | ICD-10-CM | POA: Diagnosis not present

## 2022-07-29 DIAGNOSIS — C779 Secondary and unspecified malignant neoplasm of lymph node, unspecified: Secondary | ICD-10-CM

## 2022-07-29 HISTORY — PX: BIOPSY: SHX5522

## 2022-07-29 HISTORY — PX: EUS: SHX5427

## 2022-07-29 HISTORY — PX: BILIARY BRUSHING: SHX6843

## 2022-07-29 HISTORY — PX: FINE NEEDLE ASPIRATION: SHX5430

## 2022-07-29 HISTORY — PX: SPYGLASS CHOLANGIOSCOPY: SHX5441

## 2022-07-29 HISTORY — PX: ESOPHAGOGASTRODUODENOSCOPY: SHX5428

## 2022-07-29 HISTORY — PX: ENDOSCOPIC RETROGRADE CHOLANGIOPANCREATOGRAPHY (ERCP) WITH PROPOFOL: SHX5810

## 2022-07-29 HISTORY — PX: BILIARY STENT PLACEMENT: SHX5538

## 2022-07-29 HISTORY — PX: REMOVAL OF STONES: SHX5545

## 2022-07-29 HISTORY — PX: STENT REMOVAL: SHX6421

## 2022-07-29 LAB — COMPREHENSIVE METABOLIC PANEL
ALT: 91 U/L — ABNORMAL HIGH (ref 0–44)
AST: 298 U/L — ABNORMAL HIGH (ref 15–41)
Albumin: 2.9 g/dL — ABNORMAL LOW (ref 3.5–5.0)
Alkaline Phosphatase: 76 U/L (ref 38–126)
Anion gap: 9 (ref 5–15)
BUN: 19 mg/dL (ref 8–23)
CO2: 23 mmol/L (ref 22–32)
Calcium: 8.5 mg/dL — ABNORMAL LOW (ref 8.9–10.3)
Chloride: 104 mmol/L (ref 98–111)
Creatinine, Ser: 0.6 mg/dL (ref 0.44–1.00)
GFR, Estimated: >60 mL/min (ref >60)
Glucose, Bld: 223 mg/dL — ABNORMAL HIGH (ref 70–99)
Potassium: 3.5 mmol/L (ref 3.5–5.1)
Sodium: 136 mmol/L (ref 135–145)
Total Bilirubin: 0.6 mg/dL (ref 0.3–1.2)
Total Protein: 6.2 g/dL — ABNORMAL LOW (ref 6.5–8.1)

## 2022-07-29 LAB — GLUCOSE, CAPILLARY: Glucose-Capillary: 106 mg/dL — ABNORMAL HIGH (ref 70–99)

## 2022-07-29 SURGERY — UPPER ENDOSCOPIC ULTRASOUND (EUS) RADIAL
Anesthesia: General

## 2022-07-29 MED ORDER — ACETAMINOPHEN 10 MG/ML IV SOLN: 1000.0000 mg | Freq: Once | INTRAVENOUS | Status: DC | PRN

## 2022-07-29 MED ORDER — FENTANYL CITRATE (PF) 100 MCG/2ML IJ SOLN
INTRAMUSCULAR | Status: AC
  Filled 2022-07-29: qty 2

## 2022-07-29 MED ORDER — GLUCAGON HCL RDNA (DIAGNOSTIC) 1 MG IJ SOLR
INTRAMUSCULAR | Status: AC
  Filled 2022-07-29: qty 2

## 2022-07-29 MED ORDER — ONDANSETRON HCL 4 MG/2ML IJ SOLN
INTRAMUSCULAR | Status: DC | PRN
  Administered 2022-07-29: 4 mg via INTRAVENOUS

## 2022-07-29 MED ORDER — DEXAMETHASONE SODIUM PHOSPHATE 10 MG/ML IJ SOLN
INTRAMUSCULAR | Status: DC | PRN
  Administered 2022-07-29: 4 mg via INTRAVENOUS

## 2022-07-29 MED ORDER — DICLOFENAC SUPPOSITORY 100 MG
RECTAL | Status: DC | PRN
  Administered 2022-07-29: 100 mg via RECTAL

## 2022-07-29 MED ORDER — LACTATED RINGERS IV SOLN: INTRAVENOUS | Status: DC

## 2022-07-29 MED ORDER — FENTANYL CITRATE (PF) 100 MCG/2ML IJ SOLN
INTRAMUSCULAR | Status: DC | PRN
  Administered 2022-07-29 (×2): 50 ug via INTRAVENOUS

## 2022-07-29 MED ORDER — LIDOCAINE 2% (20 MG/ML) 5 ML SYRINGE
INTRAMUSCULAR | Status: DC | PRN
  Administered 2022-07-29: 60 mg via INTRAVENOUS

## 2022-07-29 MED ORDER — SUGAMMADEX SODIUM 200 MG/2ML IV SOLN
INTRAVENOUS | Status: DC | PRN
  Administered 2022-07-29: 200 mg via INTRAVENOUS

## 2022-07-29 MED ORDER — SODIUM CHLORIDE 0.9 % IV SOLN
INTRAVENOUS | Status: DC | PRN
  Administered 2022-07-29: 20 mL

## 2022-07-29 MED ORDER — CIPROFLOXACIN IN D5W 400 MG/200ML IV SOLN
INTRAVENOUS | Status: AC
  Filled 2022-07-29: qty 200

## 2022-07-29 MED ORDER — ROCURONIUM BROMIDE 10 MG/ML (PF) SYRINGE
PREFILLED_SYRINGE | INTRAVENOUS | Status: DC | PRN
  Administered 2022-07-29: 40 mg via INTRAVENOUS

## 2022-07-29 MED ORDER — SODIUM CHLORIDE 0.9 % IV SOLN: INTRAVENOUS | Status: DC

## 2022-07-29 MED ORDER — GLUCAGON HCL RDNA (DIAGNOSTIC) 1 MG IJ SOLR
INTRAMUSCULAR | Status: DC | PRN
  Administered 2022-07-29 (×4): .25 mg via INTRAVENOUS

## 2022-07-29 MED ORDER — AMOXICILLIN-POT CLAVULANATE 875-125 MG PO TABS: 1.0000 | ORAL_TABLET | Freq: Two times a day (BID) | ORAL | 0 refills | Status: AC

## 2022-07-29 MED ORDER — SODIUM CHLORIDE 0.9 % IV SOLN
3.0000 g | Freq: Once | INTRAVENOUS | Status: AC
  Administered 2022-07-29: 3 g via INTRAVENOUS
  Filled 2022-07-29: qty 8

## 2022-07-29 MED ORDER — DICLOFENAC SUPPOSITORY 100 MG
RECTAL | Status: AC
  Filled 2022-07-29: qty 1

## 2022-07-29 MED ORDER — FENTANYL CITRATE (PF) 100 MCG/2ML IJ SOLN: 25.0000 ug | INTRAMUSCULAR | Status: DC | PRN

## 2022-07-29 MED ORDER — PROPOFOL 10 MG/ML IV BOLUS
INTRAVENOUS | Status: DC | PRN
  Administered 2022-07-29: 100 mg via INTRAVENOUS

## 2022-07-29 NOTE — Op Note (Signed)
[facility] Patient Name: Lauren [name] Procedure Date: 07/29/2022 MRN: [id number] Attending MD: Corliss Parish , MD, [id number] Date of Birth: [birth year] CSN: [id number] Age: 82 Admit Type: Outpatient Procedure:                Upper EUS Indications:              Suspected mass in liver on CT scan, Common bile                            duct dilation (acquired) seen on MRCP, Suspected                            mass in liver on MRCP Providers:                Corliss Parish, MD, Norman Clay, RN, Harrington Challenger,                            Technician Referring MD:             Wilhemina Bonito. Marina Goodell, MD, Carie Caddy. Rhea Belton, MD, Iva Boop, MD, Lytle Michaels. [place] Medicines:                General Anesthesia Complications:            No immediate complications. Estimated Blood Loss:     Estimated blood loss was minimal. Procedure:                Pre-Anesthesia Assessment:                           - Prior to the procedure, a History and Physical                            was performed, and patient medications and                            allergies were reviewed. The patient's tolerance of                            previous anesthesia was also reviewed. The risks                            and benefits of the procedure and the sedation                            options and risks were discussed with the patient.                            All questions were answered, and informed consent                            was obtained. Prior Anticoagulants: The patient has  taken no anticoagulant or antiplatelet agents. ASA                            Grade Assessment: III - A patient with severe                            systemic disease. After reviewing the risks and                            benefits, the patient was deemed in satisfactory                            condition to undergo the procedure.                           After  obtaining informed consent, the endoscope was                            passed under direct vision. Throughout the                            procedure, the patient's blood pressure, pulse, and                            oxygen saturations were monitored continuously. The                            [id number] ([id number]) Olympus linear ultrasound scope                            was introduced through the mouth, and advanced to                            the duodenum for ultrasound examination from the                            stomach and duodenum. The upper EUS was                            accomplished without difficulty. The patient                            tolerated the procedure. Scope In: Scope Out: Findings:      ENDOSONOGRAPHIC FINDING: :      Hyperechoic material suggestive of air was visualized in the common       hepatic duct.      An irregular hypoechoic mass was identified endosonographically in the       periportal region adjacent to the common hepatic duct. The mass measured       18 mm by 15 mm in maximal cross-sectional diameter. The outer margins       were poorly-defined. 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 This is in the region where the bile duct and       hepatic  duct meet and I can see narrowing developing. Fine needle biopsy       was performed. Color Doppler imaging was utilized prior to needle       puncture to confirm a lack of significant vascular structures within the       needle path. Six passes were made with the [facility] [employer] 22 gauge       Acquire biopsy needle using a transduodenal approach. Visible cores of       tissue were obtained. Preliminary cytologic examination and touch preps       were performed. Final cytology results are pending.      There was no sign of significant endosonographic abnormality in the       pancreatic head (PD = 3.0 mm), genu of the pancreas (PD = 2.6 mm),       pancreatic  body (PD = 1.6 mm) and pancreatic tail (PD = 1.3 mm). No       masses, no cysts, no calcifications, the pancreatic duct was regular in       contour.      Endosonographic imaging in the visualized portion of the liver showed no       mass.      No malignant-appearing lymph nodes were visualized in the celiac region       (level 20) and peripancreatic region.      The celiac region was visualized. Impression:               - Hyperechoic material suggestive of air was                            visualized in the common hepatic duct.                           - A mass was found in the periportal region                            adjacent to the common hepatic duct. Cytology                            results are pending. 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 A malignant lymph node could                            also be found in this particular region. Fine                            needle biopsy performed as noted above.                           - There was no sign of significant pathology in the                            pancreatic  head, genu of the pancreas, pancreatic                            body and pancreatic tail.                           - No malignant-appearing lymph nodes were                            visualized in the celiac region (level 20) and                            peripancreatic region. Moderate Sedation:      Not Applicable - Patient had care per Anesthesia. Recommendation:           - Proceed to scheduled ERCP.                           - Observe patient's clinical course.                           - Await cytology results.                           - The findings and recommendations were discussed                            with the patient.                           - The findings and recommendations were discussed                             with the patient's family. Procedure Code(s):        --- Professional ---                           [phone], Esophagogastroduodenoscopy, flexible,                            transoral; with transendoscopic ultrasound-guided                            intramural or transmural fine needle                            aspiration/biopsy(s), (includes endoscopic                            ultrasound examination limited to the esophagus,                            stomach or duodenum, and adjacent structures) Diagnosis Code(s):        --- Professional ---                           K83.8, Other specified diseases of biliary tract  I89.9, Noninfective disorder of lymphatic vessels                            and lymph nodes, unspecified                           R93.2, Abnormal findings on diagnostic imaging of                            liver and biliary tract CPT copyright 2022 American Medical Association. All rights reserved. The codes documented in this report are preliminary and upon coder review may  be revised to meet current compliance requirements. Corliss Parish, MD 07/29/2022 12:48:33 PM Number of Addenda: 0

## 2022-07-29 NOTE — Transfer of Care (Signed)
Immediate Anesthesia Transfer of Care Note  Patient: SHELITHA MCCRIMMON  Procedure(s) Performed: UPPER ENDOSCOPIC ULTRASOUND (EUS) RADIAL ENDOSCOPIC RETROGRADE CHOLANGIOPANCREATOGRAPHY (ERCP) WITH PROPOFOL SPYGLASS CHOLANGIOSCOPY ESOPHAGOGASTRODUODENOSCOPY (EGD) STENT REMOVAL FINE NEEDLE ASPIRATION (FNA) LINEAR BILIARY BRUSHING BIOPSY REMOVAL OF SLUDGE BILIARY STENT PLACEMENT  Patient Location: Endoscopy Unit  Anesthesia Type:General  Level of Consciousness: drowsy and patient cooperative  Airway & Oxygen Therapy: Patient Spontanous Breathing and Patient connected to face mask oxygen  Post-op Assessment: Report given to RN and Post -op Vital signs reviewed and stable  Post vital signs: Reviewed and stable  Last Vitals:  Vitals Value Taken Time  BP 142/53 07/29/22 1230  Temp 36.7 C 07/29/22 1229  Pulse 87 07/29/22 1231  Resp 9 07/29/22 1231  SpO2 100 % 07/29/22 1231  Vitals shown include unvalidated device data.  Last Pain:  Vitals:   07/29/22 1229  TempSrc: Tympanic  PainSc:          Complications: No notable events documented.

## 2022-07-29 NOTE — Anesthesia Postprocedure Evaluation (Signed)
Anesthesia Post Note  Patient: ELZBIETA COTTERILL  Procedure(s) Performed: UPPER ENDOSCOPIC ULTRASOUND (EUS) RADIAL ENDOSCOPIC RETROGRADE CHOLANGIOPANCREATOGRAPHY (ERCP) WITH PROPOFOL SPYGLASS CHOLANGIOSCOPY ESOPHAGOGASTRODUODENOSCOPY (EGD) STENT REMOVAL FINE NEEDLE ASPIRATION (FNA) LINEAR BILIARY BRUSHING BIOPSY REMOVAL OF SLUDGE BILIARY STENT PLACEMENT     Patient location during evaluation: PACU Anesthesia Type: General Level of consciousness: awake and alert Pain management: pain level controlled Vital Signs Assessment: post-procedure vital signs reviewed and stable Respiratory status: spontaneous breathing, nonlabored ventilation and respiratory function stable Cardiovascular status: blood pressure returned to baseline Postop Assessment: no apparent nausea or vomiting Anesthetic complications: no   No notable events documented.  Last Vitals:  Vitals:   07/29/22 1300 07/29/22 1310  BP: (!) 139/53 (!) 144/57  Pulse: 84 94  Resp: (!) 9 18  Temp:    SpO2: 100% 97%    Last Pain:  Vitals:   07/29/22 1310  TempSrc:   PainSc: 0-No pain                 Shanda Howells

## 2022-07-29 NOTE — Discharge Instructions (Signed)
YOU HAD AN ENDOSCOPIC PROCEDURE TODAY: Refer to the procedure report and other information in the discharge instructions given to you for any specific questions about what was found during the examination. If this information does not answer your questions, please call [place] office at [phone] to clarify.  ° °YOU SHOULD EXPECT: Some feelings of bloating in the abdomen. Passage of more gas than usual. Walking can help get rid of the air that was put into your GI tract during the procedure and reduce the bloating. If you had a lower endoscopy (such as a colonoscopy or flexible sigmoidoscopy) you may notice spotting of blood in your stool or on the toilet paper. Some abdominal soreness may be present for a day or two, also. ° °DIET: Your first meal following the procedure should be a light meal and then it is ok to progress to your normal diet. A half-sandwich or bowl of soup is an example of a good first meal. Heavy or fried foods are harder to digest and may make you feel nauseous or bloated. Drink plenty of fluids but you should avoid alcoholic beverages for 24 hours. If you had a esophageal dilation, please see attached instructions for diet.   ° °ACTIVITY: Your care partner should take you home directly after the procedure. You should plan to take it easy, moving slowly for the rest of the day. You can resume normal activity the day after the procedure however YOU SHOULD NOT DRIVE, use power tools, machinery or perform tasks that involve climbing or major physical exertion for 24 hours (because of the sedation medicines used during the test).  ° °SYMPTOMS TO REPORT IMMEDIATELY: °A gastroenterologist can be reached at any hour. Please call [phone]  for any of the following symptoms:  °Following lower endoscopy (colonoscopy, flexible sigmoidoscopy) °Excessive amounts of blood in the stool  °Significant tenderness, worsening of abdominal pains  °Swelling of the abdomen that is new, acute  °Fever of 100° or  higher  °Following upper endoscopy (EGD, EUS, ERCP, esophageal dilation) °Vomiting of blood or coffee ground material  °New, significant abdominal pain  °New, significant chest pain or pain under the shoulder blades  °Painful or persistently difficult swallowing  °New shortness of breath  °Black, tarry-looking or red, bloody stools ° °FOLLOW UP:  °If any biopsies were taken you will be contacted by phone or by letter within the next 1-3 weeks. Call [phone]  if you have not heard about the biopsies in 3 weeks.  °Please also call with any specific questions about appointments or follow up tests. ° °

## 2022-07-29 NOTE — Anesthesia Preprocedure Evaluation (Addendum)
Anesthesia Evaluation  Patient identified by MRN, date of birth, ID band Patient awake    Reviewed: Allergy & Precautions, NPO status , Patient's Chart, lab work & pertinent test results  Airway Mallampati: II  TM Distance: >3 FB Neck ROM: Full    Dental no notable dental hx.    Pulmonary neg pulmonary ROS   Pulmonary exam normal        Cardiovascular hypertension,  Rhythm:Regular Rate:Normal     Neuro/Psych negative neurological ROS  negative psych ROS   GI/Hepatic hiatal hernia,GERD  ,,(+) Hepatitis -  Endo/Other  diabetesHypothyroidism    Renal/GU   negative genitourinary   Musculoskeletal  (+) Arthritis , Osteoarthritis,    Abdominal Normal abdominal exam  (+)   Peds  Hematology  (+) Blood dyscrasia, anemia   Anesthesia Other Findings   Reproductive/Obstetrics                             Anesthesia Physical Anesthesia Plan  ASA: 2  Anesthesia Plan: General   Post-op Pain Management:    Induction:   PONV Risk Score and Plan: 3 and Treatment may vary due to age or medical condition, Ondansetron and Dexamethasone  Airway Management Planned: Mask and Oral ETT  Additional Equipment: None  Intra-op Plan:   Post-operative Plan: Extubation in OR  Informed Consent: I have reviewed the patients History and Physical, chart, labs and discussed the procedure including the risks, benefits and alternatives for the proposed anesthesia with the patient or authorized representative who has indicated his/her understanding and acceptance.     Dental advisory given and Interpreter used for interveiw  Plan Discussed with: CRNA  Anesthesia Plan Comments:        Anesthesia Quick Evaluation

## 2022-07-29 NOTE — H&P (Signed)
GASTROENTEROLOGY PROCEDURE H&P NOTE   Primary Care Physician: Pearson Grippe, MD  HPI: Lauren [name] is a 82 y.o. 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  Extensive discussion with the patient and patient's son using interpreter services.  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   Possible related to pyridium  G6PD studies pending   Hemorrhoids    Hiatal hernia    Hyperlipidemia    Hyperlipidemia    Hypertension    Hypothyroidism    IBS (irritable bowel syndrome)    Iron overload 07/13/2016   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   Osteoporosis    UTI (lower urinary tract infection)    Vitamin D deficiency    Past Surgical History:  Procedure Laterality Date   BILIARY BRUSHING  06/29/2022   Procedure: BILIARY BRUSHING;  Surgeon: Hilarie Fredrickson, MD;  Location: [facility];  Service: Gastroenterology;;   BILIARY STENT PLACEMENT N/A 06/29/2022   Procedure: BILIARY STENT PLACEMENT;  Surgeon: Hilarie Fredrickson, MD;  Location: [facility];  Service: Gastroenterology;  Laterality: N/A;   CHOLECYSTECTOMY N/A 03/31/2022   Procedure: LAPAROSCOPIC CHOLECYSTECTOMY;  Surgeon: Axel Filler, MD;  Location: [facility];  Service: General;  Laterality: N/A;   COLONOSCOPY     ERCP N/A 03/29/2022   Procedure: ENDOSCOPIC RETROGRADE CHOLANGIOPANCREATOGRAPHY (ERCP);  Surgeon: Iva Boop, MD;  Location: [facility];  Service: Gastroenterology;  Laterality: N/A;   ERCP N/A 06/29/2022   Procedure: ENDOSCOPIC RETROGRADE CHOLANGIOPANCREATOGRAPHY (ERCP);  Surgeon: Hilarie Fredrickson, MD;  Location: [facility];  Service: Gastroenterology;  Laterality: N/A;   PARTIAL KNEE ARTHROPLASTY Left 02/25/2020   Procedure: UNICOMPARTMENTAL  KNEE;  Surgeon: Ollen Gross, MD;  Location: [facility];  Service: Orthopedics;  Laterality: Left;    REMOVAL OF STONES  03/29/2022   Procedure: REMOVAL OF STONES;  Surgeon: Iva Boop, MD;  Location: [facility];  Service: Gastroenterology;;   Dennison Mascot  03/29/2022   Procedure: Dennison Mascot;  Surgeon: Iva Boop, MD;  Location: [facility];  Service: Gastroenterology;;   Current Facility-Administered Medications  Medication Dose Route Frequency Provider Last Rate Last Admin   0.9 %  sodium chloride infusion   Intravenous Continuous Mansouraty, Netty Starring., MD       lactated ringers infusion   Intravenous Continuous Mansouraty, Netty Starring., MD 10 mL/hr at 07/29/22 0923 New Bag at 07/29/22 0923    Current Facility-Administered Medications:    0.9 %  sodium chloride infusion, , Intravenous, Continuous, Mansouraty, Netty Starring., MD   lactated ringers infusion, , Intravenous, Continuous, Mansouraty, Netty Starring., MD, Last Rate: 10 mL/hr at 07/29/22 0923, New Bag at 07/29/22 0923 Allergies  Allergen Reactions   Phenazopyridine Anaphylaxis   Pyridium [Phenazopyridine Hcl] Other (See Comments)    Hemolytic anemia   Sulfamethoxazole-Trimethoprim Anaphylaxis   Sulfa Antibiotics     Potential allergy: oxidizing drug: methemoglobinemia/hemolysis on pyridium   Ciprofloxacin Hcl     Sx's almost like a heart attack   Esomeprazole Magnesium Nausea Only    Pain and nausea   Ferrous Sulfate Other (See Comments)   Macrodantin [Nitrofurantoin Macrocrystal] Other (See Comments)    GI Upset   Pantoprazole Sodium  Nausea Only    abd pain and nausea   Promethazine Diarrhea   Tramadol Nausea Only   Family History  Problem Relation Age of Onset   Colon cancer Neg Hx    Social History   Socioeconomic History   Marital status: Widowed    Spouse name: Not on file   Number of children: 3   Years of education: Not on file   Highest education level: Not on file  Occupational History    Occupation: retired    Associate Professor: RETIRED  Tobacco Use   Smoking status: Never   Smokeless tobacco: Never  Substance and Sexual Activity   Alcohol use: No   Drug use: No   Sexual activity: Never  Other Topics Concern   Not on file  Social History Narrative   Not on file   Social Determinants of Health   Financial Resource Strain: Not on file  Food Insecurity: No Food Insecurity (06/28/2022)   Hunger Vital Sign    Worried About Running Out of Food in the Last Year: Never true    Ran Out of Food in the Last Year: Never true  Transportation Needs: No Transportation Needs (06/28/2022)   PRAPARE - Administrator, Civil Service (Medical): No    Lack of Transportation (Non-Medical): No  Physical Activity: Not on file  Stress: Not on file  Social Connections: Not on file  Intimate Partner Violence: Not At Risk (06/28/2022)   Humiliation, Afraid, Rape, and Kick questionnaire    Fear of Current or Ex-Partner: No    Emotionally Abused: No    Physically Abused: No    Sexually Abused: No    Physical Exam: Today's Vitals   07/29/22 0911  BP: (!) 155/88  Pulse: 72  Resp: 10  Temp: 99 F (37.2 C)  TempSrc: Temporal  SpO2: 100%  Weight: 49.9 kg  Height: 5' (1.524 m)  PainSc: 0-No pain   Body mass index is 21.48 kg/m. GEN: NAD EYE: Sclerae anicteric ENT: MMM CV: Non-tachycardic GI: Soft, NT/ND NEURO:  Alert & Oriented x 3  Lab Results: No results for input(s): "WBC", "HGB", "HCT", "PLT" in the last 72 hours. BMET No results for input(s): "NA", "K", "CL", "CO2", "GLUCOSE", "BUN", "CREATININE", "CALCIUM" in the last 72 hours. LFT No results for input(s): "PROT", "ALBUMIN", "AST", "ALT", "ALKPHOS", "BILITOT", "BILIDIR", "IBILI" in the last 72 hours. PT/INR No results for input(s): "LABPROT", "INR" in the last 72 hours.   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  Extensive  discussion with the patient and patient's son using interpreter services.  The risks of an EUS including intestinal perforation, bleeding, infection, aspiration, and medication effects were discussed as was the possibility it may not give a definitive diagnosis if a biopsy is performed.  When a biopsy of the pancreas is done as part of the EUS, there is an additional risk of pancreatitis at the rate of about 1-2%.  It was explained that procedure related pancreatitis is typically mild, although it can be severe and even life threatening, which is why we do not perform random pancreatic biopsies and only biopsy a lesion/area we feel is concerning enough to warrant the risk.  The risks of an ERCP were discussed at length, including but not limited to the risk of perforation, bleeding, abdominal pain, post-ERCP pancreatitis (while usually mild can be severe and even life threatening).  The risks and benefits of endoscopic  evaluation/treatment were discussed with the patient and/or family; these include but are not limited to the risk of perforation, infection, bleeding, missed lesions, lack of diagnosis, severe illness requiring hospitalization, as well as anesthesia and sedation related illnesses.  The patient's history has been reviewed, patient examined, no change in status, and deemed stable for procedure.  The patient and/or family is agreeable to proceed.    Justice Britain, MD [facility] Office # [phone]

## 2022-07-29 NOTE — Anesthesia Procedure Notes (Signed)
Procedure Name: Intubation Date/Time: 07/29/2022 10:26 AM  Performed by: Pearson Grippe, CRNAPre-anesthesia Checklist: Patient identified, Emergency Drugs available, Suction available and Patient being monitored Patient Re-evaluated:Patient Re-evaluated prior to induction Oxygen Delivery Method: Circle system utilized Preoxygenation: Pre-oxygenation with 100% oxygen Induction Type: IV induction Ventilation: Mask ventilation without difficulty Laryngoscope Size: Miller and 2 Grade View: Grade I Tube type: Oral Tube size: 7.0 mm Number of attempts: 1 Airway Equipment and Method: Stylet Placement Confirmation: ETT inserted through vocal cords under direct vision, positive ETCO2 and breath sounds checked- equal and bilateral Secured at: 20 cm Tube secured with: Tape Dental Injury: Teeth and Oropharynx as per pre-operative assessment

## 2022-07-29 NOTE — Op Note (Addendum)
[facility] Patient Name: Lauren [name] Procedure Date: 07/29/2022 MRN: [id number] Attending MD: Corliss Parish , MD, [id number] Date of Birth: 10/07/1940 CSN: [id number] Age: 82 Admit Type: Outpatient Procedure:                ERCP Indications:              Abnormal abdominal CT, Biliary dilation on Computed                            Tomogram Scan, Abnormal MRCP, Abnormal abdominal                            MRI, Abnormal liver function test, Stent change,                            Rule out malignancy Providers:                Corliss Parish, MD, Norman Clay, RN, Harrington Challenger,                            Technician Referring MD:             Carie Caddy. Rhea Belton, MD, Wilhemina Bonito. Marina Goodell, MD, Iva Boop, MD, Lytle Michaels. Brand Medicines:                General Anesthesia, Unasyn 1.5 g IV, Diclofenac 100                            mg rectal, Glucagon 1 mg IV Complications:            No immediate complications. Estimated Blood Loss:     Estimated blood loss was minimal. Procedure:                Pre-Anesthesia Assessment:                           - Prior to the procedure, a History and Physical                            was performed, and patient medications and                            allergies were reviewed. The patient's tolerance of                            previous anesthesia was also reviewed. The risks                            and benefits of the procedure and the sedation                            options and risks were discussed with the patient.  All questions were answered, and informed consent                            was obtained. Prior Anticoagulants: The patient has                            taken no anticoagulant or antiplatelet agents. ASA                            Grade Assessment: III - A patient with severe                            systemic disease. After reviewing the risks and                             benefits, the patient was deemed in satisfactory                            condition to undergo the procedure.                           After obtaining informed consent, the scope was                            passed under direct vision. Throughout the                            procedure, the patient's blood pressure, pulse, and                            oxygen saturations were monitored continuously. The                            [id number]-Q190V ([id number] Olympus duodenoscope was                            introduced through the mouth, and used to inject                            contrast into and used for direct visualization of                            the bile duct. The ERCP was accomplished without                            difficulty. The patient tolerated the procedure. Scope In: Scope Out: Findings:      A biliary stent was visible on the scout film.      The esophagus was successfully intubated under direct vision without       detailed examination of the pharynx, larynx, and associated structures,       and upper GI tract. A biliary sphincterotomy had been performed. The       sphincterotomy appeared open. One plastic biliary stent originating in  the biliary tree was emerging from the major papilla. The stent was       visibly patent. One stent was removed from the biliary tree using a       snare and sent for cytology.      A short 0.035 inch Soft Jagwire was passed into the biliary tree. The       Hydratome sphincterotome was passed over the guidewire and the bile duct       was then deeply cannulated. Contrast was injected. I personally       interpreted the bile duct images. Ductal flow of contrast was adequate.       Image quality was adequate. Contrast extended to the hepatic ducts.       Opacification of the entire biliary tree except for the cystic duct and       gallbladder was successful. The common hepatic duct with patent       communication  between right & left hepatic ducts (Bismuth I) was       moderately dilated, secondary to a stricture just distal that was       approximately 2 cm in length. The largest diameter was 13 mm of the       intrahepatics. To discover objects, the biliary tree was swept with a       retrieval balloon. Sludge and debris and blood clots were swept from the       duct. An occlusion cholangiogram was performed that showed no further       significant biliary pathology other than what was described above. Cells       for cytology were obtained by brushing in the common hepatic duct (2       separate brushings were performed). Due to our overall concern of       potential cholangiocarcinoma, the bile duct was explored endoscopically       using the SpyGlass direct visualization system. The SpyScope was       advanced to the bifurcation. Visibility with the scope was fair. The       common hepatic duct just distal to the bifurcation contained a single       severe stenosis 20 mm in length. This stenosis was biopsied with a       SpyBite miniature biopsy forceps for histology. One 10 Fr by 7 cm       plastic biliary stent with a single external flap and a single internal       flap was placed into the common bile duct. The stent was in good       position.      A pancreatogram was not performed.      The duodenoscope was withdrawn from the patient. Impression:               - Prior biliary sphincterotomy appeared open.                           - One visibly patent stent from the biliary tree                            was seen in the major papilla. This was removed and                            sent for cytology.                           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 The stricture was                            malignant appearing.                           - The hepatic duct system (Bismuth I) was                             moderately dilated, secondary to aforementioned                            stricture.                           - The biliary tree was swept and sludge, debris and                            clots were found.                           - Cells for cytology obtained in the upper third of                            the main bile duct from the stricture.                           - Biopsy performed via cholangioscopy of the                            stricture.                           - One plastic biliary stent was placed into the                            common bile duct. Moderate Sedation:      Not Applicable - Patient had care per Anesthesia. Recommendation:           - The patient will be observed post-procedure,                            until all discharge criteria are met.                           - Discharge patient to home.                           - Patient has a contact number available for                            emergencies. The signs and symptoms of potential  delayed complications were discussed with the                            patient. Return to normal activities tomorrow.                            Written discharge instructions were provided to the                            patient.                           - Low fat diet.                           - Observe patient's clinical course.                           - Await cytology results and await path results.                           - Repeat ERCP in 4 to 6 months to exchange stent.                           - Watch for pancreatitis, bleeding, perforation,                            and cholangitis.                           - Augmentin 875 mg twice daily for 3-days to                            decrease infectious risks.                           - Continue present medications otherwise.                           - Follow up CMP drawn today.                           - If  evidence of underlying malignancy is found,                            then referral to oncology and possible surgical                            oncology will be made.                           - The findings and recommendations were discussed                            with the patient.                           -  The findings and recommendations were discussed                            with the patient's family. Procedure Code(s):        --- Professional ---                           [phone], 59, Endoscopic retrograde                            cholangiopancreatography (ERCP); with removal and                            exchange of stent(s), biliary or pancreatic duct,                            including pre- and post-dilation and guide wire                            passage, when performed, including sphincterotomy,                            when performed, each stent exchanged                           43264, Endoscopic retrograde                            cholangiopancreatography (ERCP); with removal of                            calculi/debris from biliary/pancreatic duct(s)                           43273, Endoscopic cannulation of papilla with                            direct visualization of pancreatic/common bile                            duct(s) (List separately in addition to code(s) for                            primary procedure)                           60454, 26, Endoscopic catheterization of the                            biliary ductal system, radiological supervision and                            interpretation Diagnosis Code(s):        --- Professional ---                           U98.11, Presence of other specified functional  implants                           K83.1, Obstruction of bile duct                           Z46.59, Encounter for fitting and adjustment of                            other gastrointestinal appliance and  device                           R93.5, Abnormal findings on diagnostic imaging of                            other abdominal regions, including retroperitoneum                           R79.89, Other specified abnormal findings of blood                            chemistry                           K83.8, Other specified diseases of biliary tract                           R93.2, Abnormal findings on diagnostic imaging of                            liver and biliary tract CPT copyright 2022 [employer]. All rights reserved. The codes documented in this report are preliminary and upon coder review may  be revised to meet current compliance requirements. Corliss Parish, MD 07/29/2022 12:47:35 PM Number of Addenda: 0

## 2022-07-30 ENCOUNTER — Encounter: Payer: Self-pay | Admitting: Gastroenterology

## 2022-07-30 LAB — CYTOLOGY - NON PAP

## 2022-08-02 ENCOUNTER — Encounter (HOSPITAL_COMMUNITY): Payer: Self-pay | Admitting: Gastroenterology

## 2022-08-02 ENCOUNTER — Other Ambulatory Visit: Payer: Self-pay

## 2022-08-02 DIAGNOSIS — C221 Intrahepatic bile duct carcinoma: Secondary | ICD-10-CM

## 2022-08-02 DIAGNOSIS — K831 Obstruction of bile duct: Secondary | ICD-10-CM

## 2022-08-10 ENCOUNTER — Other Ambulatory Visit: Payer: Self-pay

## 2022-08-10 DIAGNOSIS — D696 Thrombocytopenia, unspecified: Secondary | ICD-10-CM

## 2022-08-11 ENCOUNTER — Inpatient Hospital Stay: Payer: Medicare Other | Attending: Hematology | Admitting: Hematology

## 2022-08-11 ENCOUNTER — Inpatient Hospital Stay: Payer: Medicare Other

## 2022-08-11 ENCOUNTER — Encounter: Payer: Self-pay | Admitting: Hematology

## 2022-08-11 VITALS — BP 178/71 | HR 74 | Temp 97.7°F | Resp 18 | Wt 102.4 lb

## 2022-08-11 DIAGNOSIS — R519 Headache, unspecified: Secondary | ICD-10-CM | POA: Diagnosis not present

## 2022-08-11 DIAGNOSIS — C240 Malignant neoplasm of extrahepatic bile duct: Secondary | ICD-10-CM | POA: Insufficient documentation

## 2022-08-11 DIAGNOSIS — R109 Unspecified abdominal pain: Secondary | ICD-10-CM | POA: Diagnosis not present

## 2022-08-11 DIAGNOSIS — I10 Essential (primary) hypertension: Secondary | ICD-10-CM | POA: Diagnosis not present

## 2022-08-11 DIAGNOSIS — E119 Type 2 diabetes mellitus without complications: Secondary | ICD-10-CM | POA: Insufficient documentation

## 2022-08-11 DIAGNOSIS — D696 Thrombocytopenia, unspecified: Secondary | ICD-10-CM

## 2022-08-11 DIAGNOSIS — R630 Anorexia: Secondary | ICD-10-CM | POA: Insufficient documentation

## 2022-08-11 LAB — CBC WITH DIFFERENTIAL (CANCER CENTER ONLY)
Abs Immature Granulocytes: 0.01 10*3/uL (ref 0.00–0.07)
Basophils Absolute: 0 10*3/uL (ref 0.0–0.1)
Basophils Relative: 1 %
Eosinophils Absolute: 0.1 10*3/uL (ref 0.0–0.5)
Eosinophils Relative: 1 %
HCT: 30.4 % — ABNORMAL LOW (ref 36.0–46.0)
Hemoglobin: 10.2 g/dL — ABNORMAL LOW (ref 12.0–15.0)
Immature Granulocytes: 0 %
Lymphocytes Relative: 39 %
Lymphs Abs: 3.1 10*3/uL (ref 0.7–4.0)
MCH: 29.1 pg (ref 26.0–34.0)
MCHC: 33.6 g/dL (ref 30.0–36.0)
MCV: 86.9 fL (ref 80.0–100.0)
Monocytes Absolute: 0.4 10*3/uL (ref 0.1–1.0)
Monocytes Relative: 5 %
Neutro Abs: 4.3 10*3/uL (ref 1.7–7.7)
Neutrophils Relative %: 54 %
Platelet Count: 258 10*3/uL (ref 150–400)
RBC: 3.5 MIL/uL — ABNORMAL LOW (ref 3.87–5.11)
RDW: 16.4 % — ABNORMAL HIGH (ref 11.5–15.5)
WBC Count: 7.9 10*3/uL (ref 4.0–10.5)
nRBC: 0 % (ref 0.0–0.2)

## 2022-08-11 LAB — CMP (CANCER CENTER ONLY)
ALT: 21 U/L (ref 0–44)
AST: 20 U/L (ref 15–41)
Albumin: 3.9 g/dL (ref 3.5–5.0)
Alkaline Phosphatase: 91 U/L (ref 38–126)
Anion gap: 8 (ref 5–15)
BUN: 17 mg/dL (ref 8–23)
CO2: 26 mmol/L (ref 22–32)
Calcium: 9.4 mg/dL (ref 8.9–10.3)
Chloride: 104 mmol/L (ref 98–111)
Creatinine: 0.56 mg/dL (ref 0.44–1.00)
GFR, Estimated: >60 mL/min (ref >60)
Glucose, Bld: 147 mg/dL — ABNORMAL HIGH (ref 70–99)
Potassium: 3.4 mmol/L — ABNORMAL LOW (ref 3.5–5.1)
Sodium: 138 mmol/L (ref 135–145)
Total Bilirubin: 0.3 mg/dL (ref 0.3–1.2)
Total Protein: 7.2 g/dL (ref 6.5–8.1)

## 2022-08-11 MED ORDER — ACETAMINOPHEN-CODEINE 300-30 MG PO TABS: 1.0000 | ORAL_TABLET | Freq: Three times a day (TID) | ORAL | 0 refills | Status: DC | PRN

## 2022-08-11 NOTE — Progress Notes (Signed)
[facility]   Telephone:([phone] Fax:([phone]) [phone]   Clinic New Consult Note   Patient Care Team: Pearson Grippe, MD as PCP - General (Internal Medicine) Rhea Belton, Carie Caddy, MD as Consulting Physician (Gastroenterology)  Date of Service:  08/11/2022   CHIEF COMPLAINTS/PURPOSE OF CONSULTATION:  Bile obstruction  REFERRING PHYSICIAN:  Mansouraty,Gabriel Jr,MD  ASSESSMENT & PLAN:  Lauren [name] is a 82 y.o.  female with a history of diabetes, hypertension  1.  Extrahepatic cholangiocarcinoma, cTxN0M0 -She initially presented to hospital in January 2024 with septic shock due to acute cholangitis and possible cholecystitis, status post cholecystectomy.  ERCP performed on March 29, 2022 and the cytology was negative. -She was readmitted to hospital in April 2024 with abdominal pain, nausea, and low appetite, CT and MRI showed increased diffuse dilatation of intrahepatic bile duct due to 2.1 cm soft tissue mass or lymphadenopathy in the portal hepatitis, concerning for cholangiocarcinoma.  She underwent repeated the ERCP, and stent placement, a malignant stricture was seen in the CBD, and a biopsy confirmed adenocarcinoma. -She has localized extrahepatic cholangiocarcinoma, likely in the hilar area.  No image evidence of metastasis. -I discussed the above images and biopsy findings with patient and her sons in details. -I discussed that surgery is the only option to cure her cancer.  However due to the location of her cholangiocarcinoma, and her advanced age, she may not be a candidate for surgical resection.  We discussed her case with our pancreatobiliary surgeon Dr. Donell Beers and Dr. Freida Busman. -If surgery is not an option, I discussed option of radiation, or systemic therapy, which will be palliative, not curative. -I will order MMR and NexGen or sequencing [facility] on her biopsy, to see if she is a candidate for immunotherapy or targeted therapy.   2.  Postprandial abdominal pain,  headaches, and anorexia -Her postprandial abdominal pain is likely related to the underlying malignancy.  It is moderate, I will call in Tylenol 3 for her to use as needed. -She also complains of heavy pressure in her head, no other neurological symptoms.  I will obtain brain MRI with and without contrast to rule out brain metastasis, although my suspicion is not high. -I also discussed nutrition supplement, and encouraged her to eat small meals, and take nutritional supplements such as Ensure or boost. -Will refer her to dietitian     PLAN:  -Discuss in great detail about the biopsy and its finding and location - I will discuss with Dr. Freida Busman and Dr. Donell Beers about possible surgery -I will request Molecule testing for target therapy, at her age I do not recommend chemo - I order Brain MR  due to onset headaches. - I prescribe Tylenol 3 -Dietitian referral -Lab and follow-up in 2 weeks    HISTORY OF PRESENTING ILLNESS:  Lauren [name] 82 y.o. female is a here because of bile obstruction. The patient was referred by [facility] Jr,MD. The patient presents to the clinic today accompanied by son and interpreter.  Patient was admitted to ICU in January 2024 for septic shock from acute cholangitis and possible cholecystitis.  She briefly required pressor.  She was stabilized, and had a ERCP, biliary cytology was negative for malignant cells.  She was taken to the OR by surgeon and had a cholecystectomy.  Surgical path showed no evidence of malignancy.  She recovered and was discharged home.  He presented to hospital again in April 2024, with intermittent abdominal pain, especially after meals, loss of appetite, fatigue and  10 pound weight loss.  CT and abdominal MRI showed worsening dilatation of intrahepatic bile ducts with abrupt transition point in the hepatic hilum.  There is a 1.5 x 1.3 cm mass in the porta hepatis, either the primary tumor or a lymph node, caused her very obstruction.  She  underwent ERCP with plastic stent placement, a malignant stricture was seen in the CBD, and the biopsy was obtained which showed adenocarcinoma.    Pt take Tums twice a day for her abdominal pain. She has pain after she eats. Pt rate about 4-5. Pain for about 1-2 hours and when she takes the Tums it goes away. Pt denies have nausea or vomiting and her BM is no problem. Pt state that her appetite is good. Pt lost about 10 lbs. Pt energy level is good  Pt report of having head pressure and it started when she had a 2nd surgery  for about 2 months, She denies have dyspnea, pain. Pt she is able to take care of her ADLs and routine housework.  She lives independently.  Pt reporting the only thing that bother her is the pressure in her head.  She has a PMHx of.... Allergies Anemia Arthritis Diabetes mellitus GERD Hypertension Nerve/muscle Osteoporosis Thyroid Disease  Socially... Widowed 3 children  REVIEW OF SYSTEMS:    Constitutional:(-) Denies fevers, chills or abnormal night sweats Eyes: (-)Denies blurriness of vision, double vision or watery eyes Ears, nose, mouth, throat, and face: (-)Denies mucositis or sore throat Respiratory: (-)Denies cough, dyspnea or wheezes Cardiovascular: (-)Denies palpitation, chest discomfort or lower extremity swelling Gastrointestinal:  (-) Denies nausea, heartburn or change in bowel habits Skin: (-) Denies abnormal skin rashes Lymphatics:(-)  Denies new lymphadenopathy or easy bruising Neurological:(-)Denies numbness, tingling or new weaknesses Behavioral/Psych:(-)  Mood is stable, no new changes  All other systems were reviewed with the patient and are negative.   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   Osteoporosis    UTI (lower urinary tract infection)    Vitamin D deficiency     SURGICAL HISTORY: Past Surgical History:  Procedure Laterality Date   BILIARY BRUSHING  06/29/2022   Procedure: BILIARY BRUSHING;  Surgeon: Hilarie Fredrickson, MD;  Location: [facility];  Service: Gastroenterology;;   BILIARY BRUSHING  07/29/2022   Procedure: BILIARY BRUSHING;  Surgeon: Lemar Lofty., MD;  Location: [facility];  Service: Gastroenterology;;   BILIARY STENT PLACEMENT N/A 06/29/2022   Procedure: BILIARY STENT PLACEMENT;  Surgeon: Hilarie Fredrickson, MD;  Location: [facility];  Service: Gastroenterology;  Laterality: N/A;   BILIARY STENT PLACEMENT N/A 07/29/2022   Procedure: BILIARY STENT PLACEMENT;  Surgeon: Meridee Score Netty Starring., MD;  Location: [facility];  Service: Gastroenterology;  Laterality: N/A;   BIOPSY  07/29/2022   Procedure: BIOPSY;  Surgeon: Meridee Score Netty Starring., MD;  Location: [facility];  Service:  Gastroenterology;;  spybite   CHOLECYSTECTOMY N/A 03/31/2022   Procedure: LAPAROSCOPIC CHOLECYSTECTOMY;  Surgeon: Axel Filler, MD;  Location: [facility];  Service: General;  Laterality: N/A;   COLONOSCOPY     ENDOSCOPIC RETROGRADE CHOLANGIOPANCREATOGRAPHY (ERCP) WITH PROPOFOL N/A 07/29/2022   Procedure: ENDOSCOPIC RETROGRADE CHOLANGIOPANCREATOGRAPHY (ERCP) WITH PROPOFOL;  Surgeon: Lemar Lofty., MD;  Location: [facility];  Service: Gastroenterology;  Laterality: N/A;   ERCP N/A 03/29/2022   Procedure: ENDOSCOPIC RETROGRADE CHOLANGIOPANCREATOGRAPHY (ERCP);  Surgeon: Iva Boop, MD;  Location: [facility];  Service: Gastroenterology;  Laterality: N/A;   ERCP N/A 06/29/2022   Procedure: ENDOSCOPIC  RETROGRADE CHOLANGIOPANCREATOGRAPHY (ERCP);  Surgeon: Hilarie Fredrickson, MD;  Location: [facility];  Service: Gastroenterology;  Laterality: N/A;   ESOPHAGOGASTRODUODENOSCOPY N/A 07/29/2022   Procedure: ESOPHAGOGASTRODUODENOSCOPY (EGD);  Surgeon: Lemar Lofty., MD;  Location: [facility];  Service: Gastroenterology;  Laterality: N/A;   EUS N/A 07/29/2022   Procedure: UPPER ENDOSCOPIC ULTRASOUND (EUS) RADIAL;  Surgeon: Lemar Lofty., MD;  Location: [facility];  Service: Gastroenterology;  Laterality: N/A;   FINE NEEDLE ASPIRATION N/A 07/29/2022   Procedure: FINE NEEDLE ASPIRATION (FNA) LINEAR;  Surgeon: Lemar Lofty., MD;  Location: [facility];  Service: Gastroenterology;  Laterality: N/A;   PARTIAL KNEE ARTHROPLASTY Left 02/25/2020   Procedure: UNICOMPARTMENTAL KNEE;  Surgeon: Ollen Gross, MD;  Location: [facility];  Service: Orthopedics;  Laterality: Left;    REMOVAL OF STONES  03/29/2022   Procedure: REMOVAL OF STONES;  Surgeon: Iva Boop, MD;  Location: [facility];  Service: Gastroenterology;;   REMOVAL OF STONES  07/29/2022   Procedure: REMOVAL OF SLUDGE;  Surgeon: Lemar Lofty., MD;  Location: [facility];  Service: Gastroenterology;;  swept duct   SPHINCTEROTOMY  03/29/2022   Procedure: Dennison Mascot;  Surgeon: Iva Boop, MD;  Location: [facility];  Service: Gastroenterology;;   Burman Freestone CHOLANGIOSCOPY N/A 07/29/2022   Procedure: ZOXWRUEA CHOLANGIOSCOPY;  Surgeon: Lemar Lofty., MD;  Location: [facility];  Service: Gastroenterology;  Laterality: N/A;   STENT REMOVAL  07/29/2022   Procedure: STENT REMOVAL;  Surgeon: Lemar Lofty., MD;  Location: [facility];  Service: Gastroenterology;;    SOCIAL HISTORY: Social History   Socioeconomic History   Marital status: Widowed    Spouse name: Not on file   Number of children: 3   Years of education: Not on file   Highest education level: Not on file   Occupational History   Occupation: retired    Associate Professor: RETIRED  Tobacco Use   Smoking status: Never   Smokeless tobacco: Never  Substance and Sexual Activity   Alcohol use: No   Drug use: No   Sexual activity: Never  Other Topics Concern   Not on file  Social History Narrative   Not on file   Social Determinants of Health   Financial Resource Strain: Not on file  Food Insecurity: No Food Insecurity (06/28/2022)   Hunger Vital Sign    Worried About Running Out of Food in the Last Year: Never true    Ran Out of Food in the Last Year: Never true  Transportation Needs: No Transportation Needs (06/28/2022)   PRAPARE - Administrator, Civil Service (Medical): No    Lack of Transportation (Non-Medical): No  Physical Activity: Not on file  Stress: Not on file  Social Connections: Not on file  Intimate Partner Violence: Not At Risk (06/28/2022)   Humiliation, Afraid, Rape, and Kick questionnaire    Fear of Current or  Ex-Partner: No    Emotionally Abused: No    Physically Abused: No    Sexually Abused: No    FAMILY HISTORY: Family History  Problem Relation Age of Onset   Colon cancer Neg Hx     ALLERGIES:  is allergic to phenazopyridine, pyridium [phenazopyridine hcl], sulfamethoxazole-trimethoprim, sulfa antibiotics, ciprofloxacin hcl, esomeprazole magnesium, ferrous sulfate, macrodantin [nitrofurantoin macrocrystal], pantoprazole sodium, promethazine, and tramadol.  MEDICATIONS:  Current Outpatient Medications  Medication Sig Dispense Refill   acetaminophen-codeine (TYLENOL #3) 300-30 MG tablet Take 1 tablet by mouth every 8 (eight) hours as needed for moderate pain. 20 tablet 0   glucose blood (ACCU-CHEK AVIVA PLUS) test strip Use to check blood sugar 3 times daily. 90 strip 5   levothyroxine (SYNTHROID, LEVOTHROID) 75 MCG tablet Take 75 mcg by mouth daily.     losartan (COZAAR) 50 MG tablet Take 50 mg by mouth daily.     Omega-3 Fatty Acids (FISH OIL PO) Take  1 tablet by mouth daily.     omeprazole (PRILOSEC) 40 MG capsule Take 1 capsule (40 mg total) by mouth daily. (Patient taking differently: Take 40 mg by mouth 2 (two) times daily.) 30 capsule 1   ondansetron (ZOFRAN) 4 MG tablet Take 1 tablet (4 mg total) by mouth every 6 (six) hours as needed for nausea. 20 tablet 0   pravastatin (PRAVACHOL) 80 MG tablet Take 80 mg by mouth daily.     sitaGLIPtan-metformin (JANUMET) 50-500 MG per tablet Take 1 tablet by mouth 2 (two) times daily with a meal.     No current facility-administered medications for this visit.    PHYSICAL EXAMINATION: ECOG PERFORMANCE STATUS: 1 - Symptomatic but completely ambulatory  Vitals:   08/11/22 1529  BP: (!) 178/71  Pulse: 74  Resp: 18  Temp: 97.7 F (36.5 C)  SpO2: 99%   Filed Weights   08/11/22 1529  Weight: 102 lb 6.4 oz (46.4 kg)    GENERAL:alert, no distress and comfortable SKIN: skin color normal, no rashes or significant lesions EYES: normal, Conjunctiva are pink and non-injected, sclera clear  NEURO: alert & oriented x 3 with fluent speech NECK: (-)supple, thyroid normal size, non-tender, without nodularity LYMPH:(-)  no palpable lymphadenopathy in the cervical, axillary  LUNGS:(-) clear to auscultation and percussion with normal breathing effort HEART: (-) regular rate & rhythm and no murmurs and no lower extremity edema ABDOMEN:(-)abdomen soft, (+) ender and (-) normal bowel sounds  LABORATORY DATA:  I have reviewed the data as listed    Latest Ref Rng & Units 08/11/2022    2:49 PM 07/01/2022    4:28 AM 06/30/2022    4:07 AM  CBC  WBC 4.0 - 10.5 K/uL 7.9  6.7  7.8   Hemoglobin 12.0 - 15.0 g/dL 60.4  9.4  9.4   Hematocrit 36.0 - 46.0 % 30.4  27.7  27.7   Platelets 150 - 400 K/uL 258  284  255        Latest Ref Rng & Units 08/11/2022    2:49 PM 07/29/2022   12:42 PM 07/01/2022    4:28 AM  CMP  Glucose 70 - 99 mg/dL 540  981  191   BUN 8 - 23 mg/dL 17  19  15    Creatinine 0.44 - 1.00  mg/dL 4.78  2.95  6.21   Sodium 135 - 145 mmol/L 138  136  134   Potassium 3.5 - 5.1 mmol/L 3.4  3.5  3.3   Chloride 98 -  111 mmol/L 104  104  102   CO2 22 - 32 mmol/L 26  23  24    Calcium 8.9 - 10.3 mg/dL 9.4  8.5  7.9   Total Protein 6.5 - 8.1 g/dL 7.2  6.2  5.9   Total Bilirubin 0.3 - 1.2 mg/dL 0.3  0.6  0.7   Alkaline Phos 38 - 126 U/L 91  76  261   AST 15 - 41 U/L 20  298  30   ALT 0 - 44 U/L 21  91  50      RADIOGRAPHIC STUDIES: I have personally reviewed the radiological images as listed and agreed with the findings in the report. DG ERCP  Result Date: 07/29/2022 CLINICAL DATA:  82 year old female with prior common bile duct obstruction EXAM: ERCP TECHNIQUE: Multiple spot images obtained with the fluoroscopic device and submitted for interpretation post-procedure. FLUOROSCOPY: Radiation Exposure Index (as provided by the fluoroscopic device): 29 mGy Kerma COMPARISON:  MRI 06/28/2022, ERCP 06/29/2022 FINDINGS: Limited intraoperative fluoroscopic spot images during ERCP. Initial image demonstrates endoscope projecting over the upper abdomen with a plastic biliary stent. Subsequently there is removal of plastic biliary stent, placement of a safety wire and retrograde infusion of contrast partially opacifying the intrahepatic and extrahepatic biliary system. Final image demonstrates replacement of the plastic biliary stent. IMPRESSION: Limited images during ERCP demonstrates exchange of plastic biliary stent. Please refer to the dictated operative report for full details of intraoperative findings and procedure. Electronically Signed   By: Gilmer Mor D.O.   On: 07/29/2022 15:22   DG C-Arm 1-60 Min-No Report  Result Date: 07/29/2022 Fluoroscopy was utilized by the requesting physician.  No radiographic interpretation.   DG C-Arm 1-60 Min-No Report  Result Date: 07/29/2022 Fluoroscopy was utilized by the requesting physician.  No radiographic interpretation.     Orders Placed This  Encounter  Procedures   MR Brain W Wo Contrast    Standing Status:   Future    Standing Expiration Date:   08/11/2023    Order Specific Question:   If indicated for the ordered procedure, I authorize the administration of contrast media per Radiology protocol    Answer:   Yes    Order Specific Question:   What is the patient's sedation requirement?    Answer:   No Sedation    Order Specific Question:   Does the patient have a pacemaker or implanted devices?    Answer:   No    Order Specific Question:   Use SRS Protocol?    Answer:   Yes    Order Specific Question:   Preferred imaging location?    Answer:   [facility] (table limit - 550 lbs)    Order Specific Question:   Release to patient    Answer:   Immediate   CBC with Differential/Platelet    Standing Status:   Standing    Number of Occurrences:   50    Standing Expiration Date:   08/11/2023   Comprehensive metabolic panel    Standing Status:   Standing    Number of Occurrences:   50    Standing Expiration Date:   08/11/2023   Cancer antigen 19-9    Standing Status:   Standing    Number of Occurrences:   20    Standing Expiration Date:   08/11/2023    All questions were answered. The patient knows to call the clinic with any problems, questions or concerns. The total time  spent in the appointment was 60 minutes.     Malachy Mood, MD 08/11/2022   Carolin Coy am acting as scribe for Malachy Mood, MD.   I have reviewed the above documentation for accuracy and completeness, and I agree with the above.

## 2022-08-11 NOTE — Progress Notes (Signed)
I met with Lauren [name], her son, and her congregational Nurse, [name] after her consultation with Dr Mosetta Putt.  I explained my role as a nurse navigator and provided my contact information.  I explained the services provided at [facility] and provided written information.  I explained NGS testing with [facility] and made a lab appt for that peripheral blood draw.

## 2022-08-12 ENCOUNTER — Other Ambulatory Visit: Payer: Self-pay

## 2022-08-12 DIAGNOSIS — C240 Malignant neoplasm of extrahepatic bile duct: Secondary | ICD-10-CM

## 2022-08-12 NOTE — Progress Notes (Signed)
NGS testing on specimen [id number] requested via [facility] on line portal. Order [id number]

## 2022-08-12 NOTE — Progress Notes (Signed)
I spoke with Lauren [name]'s daughter, [name].  They are agreeable to go to [facility] for a surgical consultation.  I shared this with Karel Jarvis, the new patient scheduler at [facility] for Dr Modesta Messing.

## 2022-08-12 NOTE — Progress Notes (Signed)
I sent Lauren [name] in [facility] pathology department an email requesting MMR testing on specimen [id number].

## 2022-08-12 NOTE — Progress Notes (Signed)
I spoke with Drusilla Kanner at [facility] radiology department and requested 06/27/2022 CT AP and 06/28/2022 MR A images be pushed to [facility].

## 2022-08-12 NOTE — Progress Notes (Signed)
I spoke with Ms Fickett's son Lauren [name] regarding referral to [facility].  He asked that I call his sister Lauren [name] He at his mohters mobile number and discuss this with her.  I called no one answered and I was unable to leave a message.  I will call again today.

## 2022-08-13 ENCOUNTER — Inpatient Hospital Stay: Payer: Medicare Other

## 2022-08-13 DIAGNOSIS — C240 Malignant neoplasm of extrahepatic bile duct: Secondary | ICD-10-CM | POA: Diagnosis not present

## 2022-08-13 DIAGNOSIS — R109 Unspecified abdominal pain: Secondary | ICD-10-CM | POA: Diagnosis not present

## 2022-08-13 DIAGNOSIS — E119 Type 2 diabetes mellitus without complications: Secondary | ICD-10-CM | POA: Diagnosis not present

## 2022-08-13 DIAGNOSIS — I10 Essential (primary) hypertension: Secondary | ICD-10-CM | POA: Diagnosis not present

## 2022-08-13 DIAGNOSIS — R519 Headache, unspecified: Secondary | ICD-10-CM | POA: Diagnosis not present

## 2022-08-13 LAB — CBC WITH DIFFERENTIAL/PLATELET
Abs Immature Granulocytes: 0.02 10*3/uL (ref 0.00–0.07)
Basophils Absolute: 0.1 10*3/uL (ref 0.0–0.1)
Basophils Relative: 1 %
Eosinophils Absolute: 0.1 10*3/uL (ref 0.0–0.5)
Eosinophils Relative: 1 %
HCT: 31.5 % — ABNORMAL LOW (ref 36.0–46.0)
Hemoglobin: 10.6 g/dL — ABNORMAL LOW (ref 12.0–15.0)
Immature Granulocytes: 0 %
Lymphocytes Relative: 37 %
Lymphs Abs: 2.8 10*3/uL (ref 0.7–4.0)
MCH: 29.6 pg (ref 26.0–34.0)
MCHC: 33.7 g/dL (ref 30.0–36.0)
MCV: 88 fL (ref 80.0–100.0)
Monocytes Absolute: 0.4 10*3/uL (ref 0.1–1.0)
Monocytes Relative: 5 %
Neutro Abs: 4.2 10*3/uL (ref 1.7–7.7)
Neutrophils Relative %: 56 %
Platelets: 237 10*3/uL (ref 150–400)
RBC: 3.58 MIL/uL — ABNORMAL LOW (ref 3.87–5.11)
RDW: 16.6 % — ABNORMAL HIGH (ref 11.5–15.5)
WBC: 7.5 10*3/uL (ref 4.0–10.5)
nRBC: 0 % (ref 0.0–0.2)

## 2022-08-13 LAB — COMPREHENSIVE METABOLIC PANEL
ALT: 20 U/L (ref 0–44)
AST: 22 U/L (ref 15–41)
Albumin: 4 g/dL (ref 3.5–5.0)
Alkaline Phosphatase: 85 U/L (ref 38–126)
Anion gap: 11 (ref 5–15)
BUN: 16 mg/dL (ref 8–23)
CO2: 23 mmol/L (ref 22–32)
Calcium: 9.1 mg/dL (ref 8.9–10.3)
Chloride: 105 mmol/L (ref 98–111)
Creatinine, Ser: 0.5 mg/dL (ref 0.44–1.00)
GFR, Estimated: >60 mL/min (ref >60)
Glucose, Bld: 126 mg/dL — ABNORMAL HIGH (ref 70–99)
Potassium: 3.4 mmol/L — ABNORMAL LOW (ref 3.5–5.1)
Sodium: 139 mmol/L (ref 135–145)
Total Bilirubin: 0.4 mg/dL (ref 0.3–1.2)
Total Protein: 7.2 g/dL (ref 6.5–8.1)

## 2022-08-13 LAB — MISCELLANEOUS TEST: Miscellaneous Test: 10

## 2022-08-16 LAB — SURGICAL PATHOLOGY

## 2022-08-17 ENCOUNTER — Ambulatory Visit (HOSPITAL_COMMUNITY)
Admission: RE | Admit: 2022-08-17 | Discharge: 2022-08-17 | Disposition: A | Discharge status: Home / Self Care | Payer: Medicare Other | Source: Ambulatory Visit | Attending: Hematology | Admitting: Hematology

## 2022-08-17 DIAGNOSIS — G319 Degenerative disease of nervous system, unspecified: Secondary | ICD-10-CM | POA: Diagnosis not present

## 2022-08-17 DIAGNOSIS — R519 Headache, unspecified: Secondary | ICD-10-CM | POA: Diagnosis not present

## 2022-08-17 LAB — CANCER ANTIGEN 19-9: CA 19-9: 1481 U/mL — ABNORMAL HIGH (ref 0–35)

## 2022-08-17 MED ORDER — GADOBUTROL 1 MMOL/ML IV SOLN
4.5000 mL | Freq: Once | INTRAVENOUS | Status: AC | PRN
  Administered 2022-08-17: 4.5 mL via INTRAVENOUS

## 2022-08-18 ENCOUNTER — Other Ambulatory Visit: Payer: Self-pay

## 2022-08-18 NOTE — Progress Notes (Signed)
The proposed treatment discussed in conference is for discussion purpose only and is not a binding recommendation.  The patients have not been physically examined, or presented with their treatment options.  Therefore, final treatment plans cannot be decided.  

## 2022-08-19 DIAGNOSIS — C772 Secondary and unspecified malignant neoplasm of intra-abdominal lymph nodes: Secondary | ICD-10-CM | POA: Diagnosis not present

## 2022-08-19 DIAGNOSIS — K812 Acute cholecystitis with chronic cholecystitis: Secondary | ICD-10-CM | POA: Diagnosis not present

## 2022-08-19 DIAGNOSIS — C240 Malignant neoplasm of extrahepatic bile duct: Secondary | ICD-10-CM | POA: Diagnosis not present

## 2022-08-20 ENCOUNTER — Telehealth: Payer: Self-pay

## 2022-08-20 ENCOUNTER — Other Ambulatory Visit: Payer: Self-pay

## 2022-08-20 DIAGNOSIS — K7689 Other specified diseases of liver: Secondary | ICD-10-CM | POA: Diagnosis not present

## 2022-08-20 DIAGNOSIS — C240 Malignant neoplasm of extrahepatic bile duct: Secondary | ICD-10-CM | POA: Diagnosis not present

## 2022-08-20 DIAGNOSIS — C221 Intrahepatic bile duct carcinoma: Secondary | ICD-10-CM | POA: Diagnosis not present

## 2022-08-20 DIAGNOSIS — K838 Other specified diseases of biliary tract: Secondary | ICD-10-CM | POA: Diagnosis not present

## 2022-08-20 NOTE — Progress Notes (Signed)
We received email notification that the tissue sample [facility] received for NGS testing was insufficient.  I have requested [facility] reach out to our pathology department to see if there is another sample with adequate tissue for testing

## 2022-08-20 NOTE — Telephone Encounter (Signed)
Notified by [facility] that the sample received (Case# [id number]) was insufficient.  Contacted [facility] regarding another block.  Spoke with Deanna Artis in [facility] Pathology Dept and Deanna Artis stated that was the only block for that case.  Notified Dr. Mosetta Putt of the conversation.  Dr. Mosetta Putt requested for liquid biopsy to be done.  Responded to [facility] and ordered liquid biopsy from [facility].

## 2022-08-23 DIAGNOSIS — K831 Obstruction of bile duct: Secondary | ICD-10-CM | POA: Diagnosis not present

## 2022-08-23 DIAGNOSIS — C240 Malignant neoplasm of extrahepatic bile duct: Secondary | ICD-10-CM | POA: Diagnosis not present

## 2022-08-24 DIAGNOSIS — R339 Retention of urine, unspecified: Secondary | ICD-10-CM | POA: Diagnosis not present

## 2022-08-24 DIAGNOSIS — G4452 New daily persistent headache (NDPH): Secondary | ICD-10-CM | POA: Diagnosis not present

## 2022-08-24 DIAGNOSIS — C221 Intrahepatic bile duct carcinoma: Secondary | ICD-10-CM | POA: Diagnosis not present

## 2022-08-24 DIAGNOSIS — Z9049 Acquired absence of other specified parts of digestive tract: Secondary | ICD-10-CM | POA: Diagnosis not present

## 2022-08-24 DIAGNOSIS — H539 Unspecified visual disturbance: Secondary | ICD-10-CM | POA: Diagnosis not present

## 2022-08-26 ENCOUNTER — Inpatient Hospital Stay: Payer: Medicare Other | Attending: Hematology

## 2022-08-26 ENCOUNTER — Inpatient Hospital Stay (HOSPITAL_BASED_OUTPATIENT_CLINIC_OR_DEPARTMENT_OTHER): Payer: Medicare Other | Admitting: Hematology

## 2022-08-26 ENCOUNTER — Encounter: Payer: Self-pay | Admitting: Hematology

## 2022-08-26 VITALS — BP 137/61 | HR 84 | Temp 98.2°F | Resp 16 | Ht 60.0 in | Wt 103.1 lb

## 2022-08-26 DIAGNOSIS — C240 Malignant neoplasm of extrahepatic bile duct: Secondary | ICD-10-CM | POA: Insufficient documentation

## 2022-08-26 LAB — CBC WITH DIFFERENTIAL/PLATELET
Abs Immature Granulocytes: 0.02 10*3/uL (ref 0.00–0.07)
Basophils Absolute: 0 10*3/uL (ref 0.0–0.1)
Basophils Relative: 1 %
Eosinophils Absolute: 0.1 10*3/uL (ref 0.0–0.5)
Eosinophils Relative: 1 %
HCT: 31.9 % — ABNORMAL LOW (ref 36.0–46.0)
Hemoglobin: 10.6 g/dL — ABNORMAL LOW (ref 12.0–15.0)
Immature Granulocytes: 0 %
Lymphocytes Relative: 42 %
Lymphs Abs: 2.8 10*3/uL (ref 0.7–4.0)
MCH: 29.4 pg (ref 26.0–34.0)
MCHC: 33.2 g/dL (ref 30.0–36.0)
MCV: 88.4 fL (ref 80.0–100.0)
Monocytes Absolute: 0.4 10*3/uL (ref 0.1–1.0)
Monocytes Relative: 6 %
Neutro Abs: 3.4 10*3/uL (ref 1.7–7.7)
Neutrophils Relative %: 50 %
Platelets: 195 10*3/uL (ref 150–400)
RBC: 3.61 MIL/uL — ABNORMAL LOW (ref 3.87–5.11)
RDW: 16.5 % — ABNORMAL HIGH (ref 11.5–15.5)
WBC: 6.8 10*3/uL (ref 4.0–10.5)
nRBC: 0 % (ref 0.0–0.2)

## 2022-08-26 LAB — COMPREHENSIVE METABOLIC PANEL
ALT: 17 U/L (ref 0–44)
AST: 22 U/L (ref 15–41)
Albumin: 3.6 g/dL (ref 3.5–5.0)
Alkaline Phosphatase: 77 U/L (ref 38–126)
Anion gap: 7 (ref 5–15)
BUN: 16 mg/dL (ref 8–23)
CO2: 26 mmol/L (ref 22–32)
Calcium: 9.1 mg/dL (ref 8.9–10.3)
Chloride: 104 mmol/L (ref 98–111)
Creatinine, Ser: 0.61 mg/dL (ref 0.44–1.00)
GFR, Estimated: >60 mL/min (ref >60)
Glucose, Bld: 166 mg/dL — ABNORMAL HIGH (ref 70–99)
Potassium: 4 mmol/L (ref 3.5–5.1)
Sodium: 137 mmol/L (ref 135–145)
Total Bilirubin: 0.3 mg/dL (ref 0.3–1.2)
Total Protein: 7 g/dL (ref 6.5–8.1)

## 2022-08-26 NOTE — Progress Notes (Signed)
I spoke with Lauren Gillentine's daughter.  She was confused about appt time.  I have rescheduled Lauren [name] for this afternoon at 1340.

## 2022-08-26 NOTE — Assessment & Plan Note (Signed)
cTxN0M0, MMR normal  -She initially presented to hospital in January 2024 with septic shock due to acute cholangitis and possible cholecystitis, status post cholecystectomy.  ERCP performed on March 29, 2022 and the cytology was negative. -She was readmitted to hospital in April 2024 with abdominal pain, nausea, and low appetite, CT and MRI showed increased diffuse dilatation of intrahepatic bile duct due to 2.1 cm soft tissue mass or lymphadenopathy in the portal hepatitis, concerning for cholangiocarcinoma.  She underwent repeated the ERCP, and stent placement, a malignant stricture was seen in the CBD, and a biopsy confirmed adenocarcinoma. -She has localized extrahepatic cholangiocarcinoma, likely in the hilar area.  No image evidence of metastasis. -she has met surgeon Dr. Modesta Messing. Surgery was offered but pt declined due to concerns of quality of life  -I discussed option of radiation, or systemic therapy, which will be palliative, not curative. -her tumor tested showed normal MMR, she is not a candidate for immunotherapy alone.  NexGen or sequencing [facility] still pending.

## 2022-08-26 NOTE — Progress Notes (Signed)
[facility]   [phone] Fax:([phone]) [phone]   Clinic Follow up Note   Patient Care Team: Pearson Grippe, MD as PCP - General (Internal Medicine) Pyrtle, Carie Caddy, MD as Consulting Physician (Gastroenterology) Malachy Mood, MD as Consulting Physician (Oncology)  Date of Service:  08/26/2022  CHIEF COMPLAINT: f/u of Bile obstruction   CURRENT THERAPY:    ASSESSMENT:  Lauren [name] is a 82 y.o. 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  ERCP performed on March 29, 2022 and the cytology was negative. -She was readmitted to hospital in April 2024 with abdominal pain, nausea, and low appetite, CT and MRI showed increased diffuse dilatation of intrahepatic bile duct due to 2.1 cm soft tissue mass or lymphadenopathy in the portal hepatitis, concerning for cholangiocarcinoma.  She underwent repeated the ERCP, and stent placement, a malignant stricture was seen in the CBD, and a biopsy confirmed adenocarcinoma. -She has localized extrahepatic cholangiocarcinoma, likely in the hilar area.  No image evidence of metastasis. -she has met surgeon Dr. Modesta Messing. Surgery was offered but pt declined due to concerns of quality of life  -I discussed option of radiation, or systemic therapy, which will be palliative, not curative.  Patient clearly indicates that she does not want any chemo or radiation, she does not want to prolong her life, quality of life is most important to her. -her tumor tested showed normal MMR, she is not a candidate for immunotherapy alone.  NexGen or sequencing Tempus still pending. -Her life expectancy is less than 6 months.  I commend home hospice care, I discussed the logistics, benefits and limits of hospice care.  She voiced good understanding, and is open to  hospice care.  She has been referred. -Patient and her daughter had multiple questions about what to anticipate, and symptom management.  I answered all to their satisfaction.  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  She declined. -She knows to take Tylenol or ibuprofen as needed for headaches.  PLAN: -lab reviewed -reviewed Brain MR with pt -NexGen Tempus is pending -pt denied any cancer treatment -recommend home Hospice, she agrees, I will be happy to serve as her attending when she is under hospice care.  I will see her as needed in future.  SUMMARY OF ONCOLOGIC HISTORY: Oncology History Overview Note   Cancer Staging  Primary cholangiocarcinoma of extrahepatic bile duct (HCC) Staging form: Perihilar Bile Ducts, AJCC 8th Edition - Clinical: Stage Unknown (cTX, cN0, cM0) - Signed by Malachy Mood, MD on 08/26/2022     Primary cholangiocarcinoma of extrahepatic bile duct (HCC)  07/29/2022 Pathology Results     FINAL MICROSCOPIC DIAGNOSIS:   A.  COMMON HEPATIC DUCT, STRICTURE, BIOPSY:  Moderately differentiated adenocarcinoma     08/11/2022 Initial Diagnosis   Primary cholangiocarcinoma of extrahepatic bile duct (HCC)   08/17/2022 Imaging   BRAIN MR W WO CONTRAST   IMPRESSION: 1. No acute intracranial abnormality or significant interval change. 2. Generalized atrophy and white matter disease is mildly advanced for age. This likely reflects the sequela of chronic microvascular ischemia. 3. Remote lacunar infarct of the right caudate head and anterior limb of the right internal capsule  is new since the prior exam. 4. Linear lacunar infarcts of the cerebellum are new since the prior exam but not acute.    08/26/2022 Cancer Staging   Staging form: Perihilar Bile Ducts, AJCC 8th Edition - Clinical:  Stage Unknown (cTX, cN0, cM0) - Signed by Malachy Mood, MD on 08/26/2022      INTERVAL HISTORY:  Lauren [name] is here for a follow up of Bile obstruction. She was last seen by me on 08/11/2022. She presents to the clinic accompanied by daughter. Pt daughter feels like she has sharp shooting in the back of her head that makes her jump. Pt has some discomfort in her abdomen ,but it is manageable. Pt has some issues with her eye site and she is going to make an appointment to see the Dr.     All other systems were reviewed with the patient and are negative.  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   Possible related to pyridium  G6PD studies pending   Hemorrhoids    Hiatal hernia    Hyperlipidemia    Hyperlipidemia    Hypertension    Hypothyroidism    IBS (irritable bowel syndrome)    Iron overload 07/13/2016   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   Osteoporosis    UTI (lower urinary tract infection)    Vitamin D deficiency     SURGICAL HISTORY: Past Surgical History:  Procedure Laterality Date   BILIARY BRUSHING  06/29/2022   Procedure: BILIARY BRUSHING;  Surgeon: Hilarie Fredrickson, MD;  Location: [facility];  Service: Gastroenterology;;   BILIARY BRUSHING  07/29/2022   Procedure: BILIARY BRUSHING;  Surgeon: Lemar Lofty., MD;  Location: [facility];  Service: Gastroenterology;;   BILIARY STENT PLACEMENT N/A 06/29/2022   Procedure: BILIARY STENT PLACEMENT;  Surgeon: Hilarie Fredrickson, MD;  Location: [facility];  Service: Gastroenterology;  Laterality: N/A;   BILIARY STENT PLACEMENT N/A 07/29/2022   Procedure: BILIARY STENT PLACEMENT;  Surgeon: Meridee Score Netty Starring., MD;  Location: [facility];  Service: Gastroenterology;   Laterality: N/A;   BIOPSY  07/29/2022   Procedure: BIOPSY;  Surgeon: Meridee Score Netty Starring., MD;  Location: [facility];  Service: Gastroenterology;;  spybite   CHOLECYSTECTOMY N/A 03/31/2022   Procedure: LAPAROSCOPIC CHOLECYSTECTOMY;  Surgeon: Axel Filler, MD;  Location: [facility];  Service: General;  Laterality: N/A;   COLONOSCOPY     ENDOSCOPIC RETROGRADE CHOLANGIOPANCREATOGRAPHY (ERCP) WITH PROPOFOL N/A 07/29/2022   Procedure: ENDOSCOPIC RETROGRADE CHOLANGIOPANCREATOGRAPHY (ERCP) WITH PROPOFOL;  Surgeon: Lemar Lofty., MD;  Location: [facility];  Service: Gastroenterology;  Laterality: N/A;   ERCP N/A 03/29/2022   Procedure: ENDOSCOPIC RETROGRADE CHOLANGIOPANCREATOGRAPHY (ERCP);  Surgeon: Iva Boop, MD;  Location: [facility];  Service: Gastroenterology;  Laterality: N/A;   ERCP N/A 06/29/2022   Procedure: ENDOSCOPIC RETROGRADE CHOLANGIOPANCREATOGRAPHY (ERCP);  Surgeon: Hilarie Fredrickson, MD;  Location: [facility];  Service: Gastroenterology;  Laterality: N/A;   ESOPHAGOGASTRODUODENOSCOPY N/A 07/29/2022   Procedure: ESOPHAGOGASTRODUODENOSCOPY (EGD);  Surgeon: Lemar Lofty., MD;  Location: [facility];  Service: Gastroenterology;  Laterality: N/A;   EUS N/A 07/29/2022   Procedure: UPPER ENDOSCOPIC ULTRASOUND (EUS) RADIAL;  Surgeon: Lemar Lofty., MD;  Location: [facility];  Service: Gastroenterology;  Laterality: N/A;   FINE NEEDLE ASPIRATION N/A 07/29/2022   Procedure: FINE NEEDLE ASPIRATION (FNA) LINEAR;  Surgeon: Lemar Lofty., MD;  Location: [facility];  Service: Gastroenterology;  Laterality: N/A;   PARTIAL KNEE ARTHROPLASTY Left 02/25/2020   Procedure: UNICOMPARTMENTAL KNEE;  Surgeon: Ollen Gross, MD;  Location: [facility];  Service: Orthopedics;  Laterality: Left;    REMOVAL OF STONES  03/29/2022   Procedure: REMOVAL OF STONES;  Surgeon: Iva Boop, MD;  Location: [facility];  Service: Gastroenterology;;   REMOVAL OF STONES   07/29/2022   Procedure: REMOVAL OF SLUDGE;  Surgeon: Lemar Lofty., MD;  Location: [facility];  Service: Gastroenterology;;  swept duct   SPHINCTEROTOMY  03/29/2022   Procedure: Dennison Mascot;  Surgeon: Iva Boop, MD;  Location: [facility];  Service: Gastroenterology;;   Burman Freestone CHOLANGIOSCOPY N/A 07/29/2022   Procedure: ZOXWRUEA CHOLANGIOSCOPY;  Surgeon: Lemar Lofty., MD;  Location: [facility];  Service: Gastroenterology;  Laterality: N/A;   STENT REMOVAL  07/29/2022   Procedure: STENT REMOVAL;  Surgeon: Meridee Score Netty Starring., MD;  Location: [facility];  Service: Gastroenterology;;    I have reviewed the social history and family history with the patient and they are unchanged from previous note.  ALLERGIES:  is allergic to phenazopyridine, pyridium [phenazopyridine hcl], sulfamethoxazole-trimethoprim, sulfa antibiotics, ciprofloxacin hcl, esomeprazole magnesium, ferrous sulfate, macrodantin [nitrofurantoin macrocrystal], pantoprazole sodium, promethazine, and tramadol.  MEDICATIONS:  Current Outpatient Medications  Medication Sig Dispense Refill   acetaminophen-codeine (TYLENOL #3) 300-30 MG tablet Take 1 tablet by mouth every 8 (eight) hours as needed for moderate pain. 20 tablet 0   glucose blood (ACCU-CHEK AVIVA PLUS) test strip Use to check blood sugar 3 times daily. 90 strip 5   levothyroxine (SYNTHROID, LEVOTHROID) 75 MCG tablet Take 75 mcg by mouth daily.     losartan (COZAAR) 50 MG tablet Take 50 mg by mouth daily.     Omega-3 Fatty Acids (FISH OIL PO) Take 1 tablet by mouth daily.     omeprazole (PRILOSEC) 40 MG capsule Take 1 capsule (40 mg total) by mouth daily. (Patient taking differently: Take 40 mg by mouth 2 (two) times daily.) 30 capsule 1   ondansetron (ZOFRAN) 4 MG tablet Take 1 tablet (4 mg total) by mouth every 6 (six) hours as needed for nausea. 20 tablet 0   pravastatin (PRAVACHOL) 80 MG tablet Take 80 mg by mouth daily.      sitaGLIPtan-metformin (JANUMET) 50-500 MG per tablet Take 1 tablet by mouth 2 (two) times daily with a meal.     No current facility-administered medications for this visit.    PHYSICAL EXAMINATION: ECOG PERFORMANCE STATUS: 1 - Symptomatic but completely ambulatory  Vitals:   08/26/22 1341  BP: 137/61  Pulse: 84  Resp: 16  Temp: 98.2 F (36.8 C)  SpO2: 97%   Wt Readings from Last 3 Encounters:  08/26/22 103 lb 1.6 oz (46.8 kg)  08/11/22 102 lb 6.4 oz (46.4 kg)  07/29/22 110 lb (49.9 kg)    GENERAL:alert, no distress and comfortable SKIN: skin color normal, no rashes or significant lesions EYES: normal, Conjunctiva are pink and non-injected, sclera clear  NEURO: alert & oriented x 3 with fluent speech LABORATORY DATA:  I have reviewed the data as listed    Latest Ref Rng & Units 08/26/2022    1:09 PM 08/13/2022    1:28 PM 08/11/2022    2:49 PM  CBC  WBC 4.0 -  10.5 K/uL 6.8  7.5  7.9   Hemoglobin 12.0 - 15.0 g/dL 16.1  09.6  04.5   Hematocrit 36.0 - 46.0 % 31.9  31.5  30.4   Platelets 150 - 400 K/uL 195  237  258         Latest Ref Rng & Units 08/26/2022    1:09 PM 08/13/2022    1:28 PM 08/11/2022    2:49 PM  CMP  Glucose 70 - 99 mg/dL 409  811  914   BUN 8 - 23 mg/dL 16  16  17    Creatinine 0.44 - 1.00 mg/dL 7.82  9.56  2.13   Sodium 135 - 145 mmol/L 137  139  138   Potassium 3.5 - 5.1 mmol/L 4.0  3.4  3.4   Chloride 98 - 111 mmol/L 104  105  104   CO2 22 - 32 mmol/L 26  23  26    Calcium 8.9 - 10.3 mg/dL 9.1  9.1  9.4   Total Protein 6.5 - 8.1 g/dL 7.0  7.2  7.2   Total Bilirubin 0.3 - 1.2 mg/dL 0.3  0.4  0.3   Alkaline Phos 38 - 126 U/L 77  85  91   AST 15 - 41 U/L 22  22  20    ALT 0 - 44 U/L 17  20  21        RADIOGRAPHIC STUDIES: I have personally reviewed the radiological images as listed and agreed with the findings in the report. No results found.    No orders of the defined types were placed in this encounter.  All questions were answered. The  patient knows to call the clinic with any problems, questions or concerns. No barriers to learning was detected. The total time spent in the appointment was 40 minutes.     Malachy Mood, MD 08/26/2022   Carolin Coy, CMA, am acting as scribe for Malachy Mood, MD.   I have reviewed the above documentation for accuracy and completeness, and I agree with the above.

## 2022-09-06 DIAGNOSIS — R338 Other retention of urine: Secondary | ICD-10-CM | POA: Diagnosis not present

## 2022-09-06 DIAGNOSIS — R3914 Feeling of incomplete bladder emptying: Secondary | ICD-10-CM | POA: Diagnosis not present

## 2022-09-17 DIAGNOSIS — R339 Retention of urine, unspecified: Secondary | ICD-10-CM | POA: Diagnosis not present

## 2022-10-07 ENCOUNTER — Other Ambulatory Visit: Payer: Self-pay

## 2022-10-07 ENCOUNTER — Telehealth: Payer: Self-pay | Admitting: Hematology

## 2022-10-10 DIAGNOSIS — R339 Retention of urine, unspecified: Secondary | ICD-10-CM | POA: Diagnosis not present

## 2022-10-10 NOTE — Assessment & Plan Note (Signed)
cTxN0M0, MMR normal  -She initially presented to hospital in January 2024 with septic shock due to acute cholangitis and possible cholecystitis, status post cholecystectomy.  ERCP performed on March 29, 2022 and the cytology was negative. -She was readmitted to hospital in April 2024 with abdominal pain, nausea, and low appetite, CT and MRI showed increased diffuse dilatation of intrahepatic bile duct due to 2.1 cm soft tissue mass or lymphadenopathy in the portal hepatitis, concerning for cholangiocarcinoma.  She underwent repeated the ERCP, and stent placement, a malignant stricture was seen in the CBD, and a biopsy confirmed adenocarcinoma. -She has localized extrahepatic cholangiocarcinoma, likely in the hilar area.  No image evidence of metastasis. -she has met surgeon Dr. Modesta Messing. Surgery was offered but pt declined due to concerns of quality of life  -I discussed option of radiation, or systemic therapy, which will be palliative, not curative. -her tumor tested showed normal MMR, she is not a candidate for immunotherapy alone.   -Patient clearly indicates that she does not want any chemo or radiation, she does not want to prolong her life, quality of life is most important to her.  I referred her to home hospice in June 2024

## 2022-10-11 ENCOUNTER — Telehealth: Payer: Self-pay | Admitting: Internal Medicine

## 2022-10-11 ENCOUNTER — Encounter: Payer: Self-pay | Admitting: Hematology

## 2022-10-11 ENCOUNTER — Other Ambulatory Visit: Payer: Self-pay

## 2022-10-11 ENCOUNTER — Inpatient Hospital Stay: Attending: Hematology

## 2022-10-11 ENCOUNTER — Inpatient Hospital Stay (HOSPITAL_BASED_OUTPATIENT_CLINIC_OR_DEPARTMENT_OTHER): Admitting: Hematology

## 2022-10-11 VITALS — BP 149/59 | HR 103 | Temp 98.3°F | Resp 18 | Ht 60.0 in | Wt 102.2 lb

## 2022-10-11 DIAGNOSIS — C240 Malignant neoplasm of extrahepatic bile duct: Secondary | ICD-10-CM | POA: Insufficient documentation

## 2022-10-11 DIAGNOSIS — R519 Headache, unspecified: Secondary | ICD-10-CM | POA: Diagnosis not present

## 2022-10-11 LAB — COMPREHENSIVE METABOLIC PANEL
ALT: 49 U/L — ABNORMAL HIGH (ref 0–44)
AST: 28 U/L (ref 15–41)
Albumin: 3.8 g/dL (ref 3.5–5.0)
Alkaline Phosphatase: 169 U/L — ABNORMAL HIGH (ref 38–126)
Anion gap: 8 (ref 5–15)
BUN: 12 mg/dL (ref 8–23)
CO2: 23 mmol/L (ref 22–32)
Calcium: 9.1 mg/dL (ref 8.9–10.3)
Chloride: 96 mmol/L — ABNORMAL LOW (ref 98–111)
Creatinine, Ser: 0.56 mg/dL (ref 0.44–1.00)
GFR, Estimated: >60 mL/min (ref >60)
Glucose, Bld: 211 mg/dL — ABNORMAL HIGH (ref 70–99)
Potassium: 3.8 mmol/L (ref 3.5–5.1)
Sodium: 127 mmol/L — ABNORMAL LOW (ref 135–145)
Total Bilirubin: 0.3 mg/dL (ref 0.3–1.2)
Total Protein: 6.7 g/dL (ref 6.5–8.1)

## 2022-10-11 LAB — CBC WITH DIFFERENTIAL/PLATELET
Abs Immature Granulocytes: 0.05 10*3/uL (ref 0.00–0.07)
Basophils Absolute: 0.1 10*3/uL (ref 0.0–0.1)
Basophils Relative: 1 %
Eosinophils Absolute: 0.1 10*3/uL (ref 0.0–0.5)
Eosinophils Relative: 1 %
HCT: 30.8 % — ABNORMAL LOW (ref 36.0–46.0)
Hemoglobin: 10.8 g/dL — ABNORMAL LOW (ref 12.0–15.0)
Immature Granulocytes: 1 %
Lymphocytes Relative: 31 %
Lymphs Abs: 2.2 10*3/uL (ref 0.7–4.0)
MCH: 30.7 pg (ref 26.0–34.0)
MCHC: 35.1 g/dL (ref 30.0–36.0)
MCV: 87.5 fL (ref 80.0–100.0)
Monocytes Absolute: 0.3 10*3/uL (ref 0.1–1.0)
Monocytes Relative: 5 %
Neutro Abs: 4.3 10*3/uL (ref 1.7–7.7)
Neutrophils Relative %: 61 %
Platelets: 298 10*3/uL (ref 150–400)
RBC: 3.52 MIL/uL — ABNORMAL LOW (ref 3.87–5.11)
RDW: 14.2 % (ref 11.5–15.5)
WBC: 7 10*3/uL (ref 4.0–10.5)
nRBC: 0 % (ref 0.0–0.2)

## 2022-10-11 NOTE — Progress Notes (Signed)
[facility]   [phone] [phone]   Clinic Follow up Note   Patient Care Team: Pearson Grippe, MD as PCP - General (Internal Medicine) Pyrtle, Carie Caddy, MD as Consulting Physician (Gastroenterology) Malachy Mood, MD as Consulting Physician (Oncology)  Date of Service:  10/11/2022  CHIEF COMPLAINT: f/u of Bile obstruction   CURRENT THERAPY:    ASSESSMENT:  Lauren [name] is a 82 y.o. female with   Headaches -I previously reviewed her brain MRI scan images, and discussed the findings of old stroke with patient and her daughter. No evidence of brain mets or other acute findings  -She has tried Tylenol, Tylenol 3, oxycodone and morphine but did not work well for her headaches -I will refer her to neurologist Dr. 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  ERCP performed on March 29, 2022 and the cytology was negative. -She was readmitted to hospital in April 2024 with abdominal pain, nausea, and low appetite, CT and MRI showed increased diffuse dilatation of intrahepatic bile duct due to 2.1 cm soft tissue mass or lymphadenopathy in the portal hepatitis, concerning for cholangiocarcinoma.  She underwent repeated the ERCP, and stent placement, a malignant stricture was seen in the CBD, and a biopsy confirmed adenocarcinoma. -She has localized extrahepatic cholangiocarcinoma, likely in the hilar area.  No image evidence of metastasis. -she has met surgeon Dr. Modesta Messing. Surgery was offered but pt declined due to concerns of quality of life  -I discussed option of radiation, or systemic therapy, which will be palliative, not curative. -her tumor tested showed normal MMR, she is not a candidate for immunotherapy alone.   -Patient clearly indicates that she does not want any  chemo or radiation, she does not want to prolong her life, quality of life is most important to her.  I referred her to home hospice in June 2024 -She will temporarily hold hospice service now to allow her to be evaluated and treated for her headaches    PLAN: -referral to Neurologist Dr. Barbaraann Cao -f/u in 3 weeks    SUMMARY OF ONCOLOGIC HISTORY: Oncology History Overview Note   Cancer Staging  Primary cholangiocarcinoma of extrahepatic bile duct (HCC) Staging form: Perihilar Bile Ducts, AJCC 8th Edition - Clinical: Stage Unknown (cTX, cN0, cM0) - Signed by Malachy Mood, MD on 08/26/2022     Primary cholangiocarcinoma of extrahepatic bile duct (HCC)  07/29/2022 Pathology Results     FINAL MICROSCOPIC DIAGNOSIS:   A.  COMMON HEPATIC DUCT, STRICTURE, BIOPSY:  Moderately differentiated adenocarcinoma     08/11/2022 Initial Diagnosis   Primary cholangiocarcinoma of extrahepatic bile duct (HCC)   08/17/2022 Imaging   BRAIN MR W WO CONTRAST   IMPRESSION: 1. No acute intracranial abnormality or significant interval change. 2. Generalized atrophy and white matter disease is mildly advanced for age. This likely reflects the sequela of chronic microvascular ischemia. 3. 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 4. Linear lacunar infarcts of the cerebellum are new since the prior exam but not acute.    08/26/2022 Cancer Staging   Staging form: Perihilar Bile Ducts, AJCC 8th Edition - Clinical: Stage Unknown (cTX, cN0, cM0) - Signed by Malachy Mood, MD on 08/26/2022      INTERVAL HISTORY:  Lauren Lema  [name] is here for a follow up of Bile obstruction . She was last seen by me on 08/26/2022 She presents to the clinic accompanied by daughter. Pt daughter states that her headache is still present, but she was down for about 4 days when she was giving morphine.Pt state that her head fills very heavy and the pain is  all over head. Pt is eating well, but can't taste anything.      All other systems were reviewed with the patient and are negative.  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   Possible related to pyridium  G6PD studies pending   Hemorrhoids    Hiatal hernia    Hyperlipidemia    Hyperlipidemia    Hypertension    Hypothyroidism    IBS (irritable bowel syndrome)    Iron overload 07/13/2016   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   Osteoporosis    UTI (lower urinary tract infection)    Vitamin D deficiency     SURGICAL HISTORY: Past Surgical History:  Procedure Laterality Date   BILIARY BRUSHING  06/29/2022   Procedure: BILIARY BRUSHING;  Surgeon: Hilarie Fredrickson, MD;  Location: [facility];  Service: Gastroenterology;;   BILIARY BRUSHING  07/29/2022   Procedure: BILIARY BRUSHING;  Surgeon: Lemar Lofty., MD;  Location: [facility];  Service: Gastroenterology;;   BILIARY STENT PLACEMENT N/A 06/29/2022   Procedure: BILIARY STENT PLACEMENT;  Surgeon: Hilarie Fredrickson, MD;  Location: [facility];  Service: Gastroenterology;  Laterality: N/A;   BILIARY STENT PLACEMENT N/A 07/29/2022   Procedure: BILIARY STENT PLACEMENT;  Surgeon: Meridee Score Netty Starring., MD;  Location: [facility];  Service: Gastroenterology;  Laterality: N/A;   BIOPSY  07/29/2022   Procedure: BIOPSY;  Surgeon: Meridee Score Netty Starring., MD;  Location: [facility];  Service: Gastroenterology;;  spybite   CHOLECYSTECTOMY N/A 03/31/2022   Procedure: LAPAROSCOPIC CHOLECYSTECTOMY;  Surgeon: Axel Filler, MD;  Location: [facility];  Service: General;  Laterality: N/A;   COLONOSCOPY     ENDOSCOPIC RETROGRADE CHOLANGIOPANCREATOGRAPHY (ERCP) WITH PROPOFOL N/A 07/29/2022    Procedure: ENDOSCOPIC RETROGRADE CHOLANGIOPANCREATOGRAPHY (ERCP) WITH PROPOFOL;  Surgeon: Lemar Lofty., MD;  Location: [facility];  Service: Gastroenterology;  Laterality: N/A;   ERCP N/A 03/29/2022   Procedure: ENDOSCOPIC RETROGRADE CHOLANGIOPANCREATOGRAPHY (ERCP);  Surgeon: Iva Boop, MD;  Location: [facility];  Service: Gastroenterology;  Laterality: N/A;   ERCP N/A 06/29/2022   Procedure: ENDOSCOPIC RETROGRADE CHOLANGIOPANCREATOGRAPHY (ERCP);  Surgeon: Hilarie Fredrickson, MD;  Location: [facility];  Service: Gastroenterology;  Laterality: N/A;   ESOPHAGOGASTRODUODENOSCOPY N/A 07/29/2022   Procedure: ESOPHAGOGASTRODUODENOSCOPY (EGD);  Surgeon: Lemar Lofty., MD;  Location: [facility];  Service: Gastroenterology;  Laterality: N/A;   EUS N/A 07/29/2022   Procedure: UPPER ENDOSCOPIC ULTRASOUND (EUS) RADIAL;  Surgeon: Lemar Lofty., MD;  Location: [facility];  Service: Gastroenterology;  Laterality: N/A;   FINE NEEDLE ASPIRATION N/A 07/29/2022   Procedure: FINE NEEDLE ASPIRATION (FNA) LINEAR;  Surgeon: Lemar Lofty., MD;  Location: [facility];  Service: Gastroenterology;  Laterality: N/A;   PARTIAL KNEE ARTHROPLASTY Left 02/25/2020   Procedure: UNICOMPARTMENTAL KNEE;  Surgeon: Ollen Gross, MD;  Location: [facility];  Service: Orthopedics;  Laterality: Left;    REMOVAL OF STONES  03/29/2022   Procedure: REMOVAL OF STONES;  Surgeon: Iva Boop, MD;  Location: [facility];  Service: Gastroenterology;;   REMOVAL OF STONES  07/29/2022   Procedure: REMOVAL OF SLUDGE;  Surgeon: Lemar Lofty., MD;  Location: [facility];  Service: Gastroenterology;;  swept duct   SPHINCTEROTOMY  03/29/2022   Procedure: Dennison Mascot;  Surgeon: Iva Boop, MD;  Location: [facility];  Service: Gastroenterology;;   Burman Freestone CHOLANGIOSCOPY N/A 07/29/2022   Procedure: [id number] CHOLANGIOSCOPY;  Surgeon: Lemar Lofty., MD;  Location: [facility];  Service: Gastroenterology;  Laterality: N/A;   STENT REMOVAL  07/29/2022   Procedure: STENT REMOVAL;  Surgeon: Meridee Score Netty Starring., MD;  Location: [facility];  Service: Gastroenterology;;    I have reviewed the social history and family history with the patient and they are unchanged from previous note.  ALLERGIES:  is allergic to phenazopyridine, pyridium [phenazopyridine hcl], sulfamethoxazole-trimethoprim, sulfa antibiotics, ciprofloxacin hcl, esomeprazole magnesium, ferrous sulfate, macrodantin [nitrofurantoin macrocrystal], pantoprazole sodium, promethazine, and tramadol.  MEDICATIONS:  Current Outpatient Medications  Medication Sig Dispense Refill   acetaminophen-codeine (TYLENOL #3) 300-30 MG tablet Take 1 tablet by mouth every 8 (eight) hours as needed for moderate pain. 20 tablet 0   glucose blood (ACCU-CHEK AVIVA PLUS) test strip Use to check blood sugar 3 times daily. 90 strip 5   levothyroxine (SYNTHROID, LEVOTHROID) 75 MCG tablet Take 75 mcg by mouth daily.     losartan (COZAAR) 50 MG tablet Take 50 mg by mouth daily.     Omega-3 Fatty Acids (FISH OIL PO) Take 1 tablet by mouth daily.     omeprazole (PRILOSEC) 40 MG capsule Take 1 capsule (40 mg total) by mouth daily. (Patient taking differently: Take 40 mg by mouth 2 (two) times daily.) 30 capsule 1   ondansetron (ZOFRAN) 4 MG tablet Take 1 tablet (4 mg total) by mouth every 6 (six) hours as needed for nausea. 20 tablet 0   pravastatin (PRAVACHOL) 80 MG tablet Take 80 mg by mouth daily.     sitaGLIPtan-metformin (JANUMET) 50-500 MG per tablet Take 1 tablet by mouth 2 (two) times daily with a meal.     No current facility-administered medications for this visit.    PHYSICAL EXAMINATION: ECOG PERFORMANCE STATUS: 1 - Symptomatic but completely ambulatory  Vitals:   10/11/22 0940  BP: (!) 149/59  Pulse: (!) 103  Resp: 18  Temp: 98.3 F (36.8 C)  SpO2: 100%   Wt Readings from Last 3 Encounters:   10/11/22 102 lb 3.2 oz (46.4 kg)  08/26/22 103 lb 1.6 oz (46.8 kg)  08/11/22 102 lb 6.4 oz (46.4 kg)     GENERAL:alert, no distress and comfortable SKIN: skin color normal, no rashes or significant lesions EYES: normal, Conjunctiva are pink and non-injected, sclera clear  NEURO: alert & oriented x 3 with fluent speech LABORATORY DATA:  I have reviewed the data as listed    Latest Ref Rng & Units 10/11/2022    9:05 AM 08/26/2022    1:09 PM 08/13/2022    1:28 PM  CBC  WBC 4.0 - 10.5 K/uL 7.0  6.8  7.5   Hemoglobin 12.0 - 15.0 g/dL 27.2  53.6  64.4   Hematocrit 36.0 - 46.0 % 30.8  31.9  31.5   Platelets 150 - 400 K/uL 298  195  237         Latest Ref Rng & Units 10/11/2022  9:05 AM 08/26/2022    1:09 PM 08/13/2022    1:28 PM  CMP  Glucose 70 - 99 mg/dL 161  096  045   BUN 8 - 23 mg/dL 12  16  16    Creatinine 0.44 - 1.00 mg/dL 4.09  8.11  9.14   Sodium 135 - 145 mmol/L 127  137  139   Potassium 3.5 - 5.1 mmol/L 3.8  4.0  3.4   Chloride 98 - 111 mmol/L 96  104  105   CO2 22 - 32 mmol/L 23  26  23    Calcium 8.9 - 10.3 mg/dL 9.1  9.1  9.1   Total Protein 6.5 - 8.1 g/dL 6.7  7.0  7.2   Total Bilirubin 0.3 - 1.2 mg/dL 0.3  0.3  0.4   Alkaline Phos 38 - 126 U/L 169  77  85   AST 15 - 41 U/L 28  22  22    ALT 0 - 44 U/L 49  17  20       RADIOGRAPHIC STUDIES: I have personally reviewed the radiological images as listed and agreed with the findings in the report. No results found.    Orders Placed This Encounter  Procedures   Ambulatory referral to Neurology    Referral Priority:   Routine    Referral Type:   Consultation    Referral Reason:   Specialty Services Required    Requested Specialty:   Neurology    Number of Visits Requested:   1   All questions were answered. The patient knows to call the clinic with any problems, questions or concerns. No barriers to learning was detected. The total time spent in the appointment was 25 minutes.     Malachy Mood, MD 10/11/2022    Carolin Coy, CMA, am acting as scribe for Malachy Mood, MD.   I have reviewed the above documentation for accuracy and completeness, and I agree with the above.

## 2022-10-12 ENCOUNTER — Other Ambulatory Visit: Payer: Self-pay

## 2022-10-12 ENCOUNTER — Telehealth: Payer: Self-pay | Admitting: Hematology

## 2022-10-15 DIAGNOSIS — C240 Malignant neoplasm of extrahepatic bile duct: Secondary | ICD-10-CM | POA: Diagnosis not present

## 2022-10-18 ENCOUNTER — Telehealth: Payer: Self-pay

## 2022-10-18 ENCOUNTER — Other Ambulatory Visit: Payer: Self-pay

## 2022-10-18 ENCOUNTER — Telehealth: Payer: Self-pay | Admitting: Hematology

## 2022-10-18 DIAGNOSIS — G894 Chronic pain syndrome: Secondary | ICD-10-CM

## 2022-10-18 DIAGNOSIS — C240 Malignant neoplasm of extrahepatic bile duct: Secondary | ICD-10-CM

## 2022-10-18 NOTE — Telephone Encounter (Signed)
Pt's daughter Lauren [name] called regarding pt's increased pain and fatigue.  Lauren [name] stated the pt is weak and having generalized pain.  Lauren [name] stated the pt is taking Acetaminophen 500mg  tabs (3 each for a total of 1500mg ) Q6hrs TID.  Lauren [name] stated that the Tylenol#3 is not being used d/t pt had an allergic reaction to the medication and that Dr. Mosetta Putt is aware.  Pt's daughter stated that the pt is having difficulty w/chewing food but denied s/s of mucositis and thrush.  Pt c/o of nausea but resolved w/PO antiemetic (Ondansetron).  Pt's daughter denied vomiting, diarrhea, and constipation.  Lauren [name] stated Dr. Mosetta Putt is aware of the Acetaminophen dose the pt is currently taking but this dose of Acetaminophen is not working; therefore, pt would like something stronger for pain.  Informed Lauren [name] that Dr. Mosetta Putt is currently seeing pt in clinic but this nurse will make Dr. Mosetta Putt aware of their call.  Lauren [name] had no further questions or concerns.  Notified Dr. Mosetta Putt, Santiago Glad, NP and Vincent Gros, NP of the pt's daughter's call.

## 2022-10-18 NOTE — Progress Notes (Signed)
Verbal order w/readback given by Dr. Mosetta Putt for referral for [facility] w/Athena Pickenpack-Cousar, NP for pain management.  [facility] was sent a staff message regarding referral order.

## 2022-10-19 ENCOUNTER — Other Ambulatory Visit (HOSPITAL_COMMUNITY): Payer: Self-pay

## 2022-10-19 ENCOUNTER — Other Ambulatory Visit: Payer: Self-pay

## 2022-10-19 ENCOUNTER — Inpatient Hospital Stay: Attending: Hematology | Admitting: Nurse Practitioner

## 2022-10-19 ENCOUNTER — Encounter: Payer: Self-pay | Admitting: Nurse Practitioner

## 2022-10-19 VITALS — BP 147/71 | HR 98 | Temp 98.2°F | Resp 18 | Ht 60.0 in | Wt 102.4 lb

## 2022-10-19 DIAGNOSIS — C240 Malignant neoplasm of extrahepatic bile duct: Secondary | ICD-10-CM | POA: Diagnosis not present

## 2022-10-19 DIAGNOSIS — R530 Neoplastic (malignant) related fatigue: Secondary | ICD-10-CM | POA: Diagnosis not present

## 2022-10-19 DIAGNOSIS — R630 Anorexia: Secondary | ICD-10-CM

## 2022-10-19 DIAGNOSIS — Z515 Encounter for palliative care: Secondary | ICD-10-CM | POA: Diagnosis not present

## 2022-10-19 DIAGNOSIS — Z7189 Other specified counseling: Secondary | ICD-10-CM

## 2022-10-19 DIAGNOSIS — G893 Neoplasm related pain (acute) (chronic): Secondary | ICD-10-CM

## 2022-10-19 DIAGNOSIS — R110 Nausea: Secondary | ICD-10-CM | POA: Diagnosis not present

## 2022-10-19 MED ORDER — OXYCODONE HCL 5 MG/5ML PO SOLN
5.0000 mg | Freq: Four times a day (QID) | ORAL | 0 refills | Status: DC | PRN
  Filled 2022-10-19: qty 240, 12d supply, fill #0

## 2022-10-19 MED ORDER — ONDANSETRON HCL 4 MG PO TABS
4.0000 mg | ORAL_TABLET | Freq: Four times a day (QID) | ORAL | 0 refills | Status: AC | PRN
Start: 2022-10-19 — End: ?
  Filled 2022-10-19: qty 20, 5d supply, fill #0

## 2022-10-19 NOTE — Progress Notes (Signed)
Palliative Medicine [facility]  Telephone:(336) [phone] Fax:([phone]) [phone]   Name: Lauren [name] Date: 10/19/2022 MRN: [id number]  DOB: 1940-06-26  Patient Care Team: Pearson Grippe, MD as PCP - General (Internal Medicine) Pyrtle, Carie Caddy, MD as Consulting Physician (Gastroenterology) Malachy Mood, MD as Consulting Physician (Oncology)    REASON FOR CONSULTATION: Lauren [name] is a 82 y.o. 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 Patient was previously under hospice services and has rescinded those services, palliative asked to see for symptom management and goals of care.    SOCIAL HISTORY:     reports that she has never smoked. She has never used smokeless tobacco. She reports that she does not drink alcohol and does not use drugs.  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   Possible related to pyridium  G6PD studies pending   Hemorrhoids    Hiatal hernia    Hyperlipidemia    Hyperlipidemia    Hypertension    Hypothyroidism    IBS (irritable bowel syndrome)    Iron overload 07/13/2016   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   Osteoporosis    UTI (lower urinary tract infection)    Vitamin D deficiency     PAST SURGICAL HISTORY:  Past Surgical History:  Procedure Laterality Date   BILIARY BRUSHING  06/29/2022   Procedure: BILIARY BRUSHING;  Surgeon: Hilarie Fredrickson, MD;  Location: [facility];  Service: Gastroenterology;;   BILIARY BRUSHING  07/29/2022   Procedure: BILIARY BRUSHING;   Surgeon: Lemar Lofty., MD;  Location: [facility];  Service: Gastroenterology;;   BILIARY STENT PLACEMENT N/A 06/29/2022   Procedure: BILIARY STENT PLACEMENT;  Surgeon: Hilarie Fredrickson, MD;  Location: [facility];  Service: Gastroenterology;  Laterality: N/A;   BILIARY STENT PLACEMENT N/A 07/29/2022   Procedure: BILIARY STENT PLACEMENT;  Surgeon: Meridee Score Netty Starring., MD;  Location: [facility];  Service: Gastroenterology;  Laterality: N/A;   BIOPSY  07/29/2022   Procedure: BIOPSY;  Surgeon: Meridee Score Netty Starring., MD;  Location: [facility];  Service: Gastroenterology;;  spybite   CHOLECYSTECTOMY N/A 03/31/2022   Procedure: LAPAROSCOPIC CHOLECYSTECTOMY;  Surgeon: Axel Filler, MD;  Location: [facility];  Service: General;  Laterality: N/A;   COLONOSCOPY     ENDOSCOPIC RETROGRADE CHOLANGIOPANCREATOGRAPHY (ERCP) WITH PROPOFOL N/A 07/29/2022   Procedure: ENDOSCOPIC RETROGRADE CHOLANGIOPANCREATOGRAPHY (ERCP) WITH PROPOFOL;  Surgeon: Lemar Lofty., MD;  Location: [facility];  Service: Gastroenterology;  Laterality: N/A;   ERCP N/A 03/29/2022   Procedure: ENDOSCOPIC RETROGRADE CHOLANGIOPANCREATOGRAPHY (ERCP);  Surgeon: Iva Boop, MD;  Location: [facility];  Service: Gastroenterology;  Laterality: N/A;   ERCP N/A 06/29/2022   Procedure: ENDOSCOPIC RETROGRADE CHOLANGIOPANCREATOGRAPHY (ERCP);  Surgeon: Hilarie Fredrickson, MD;  Location: [facility];  Service: Gastroenterology;  Laterality: N/A;   ESOPHAGOGASTRODUODENOSCOPY N/A 07/29/2022   Procedure: ESOPHAGOGASTRODUODENOSCOPY (EGD);  Surgeon: Lemar Lofty., MD;  Location: [facility];  Service: Gastroenterology;  Laterality: N/A;   EUS N/A 07/29/2022   Procedure:  UPPER ENDOSCOPIC ULTRASOUND (EUS) RADIAL;  Surgeon: Meridee Score Netty Starring., MD;  Location: [facility];  Service: Gastroenterology;  Laterality: N/A;   FINE NEEDLE ASPIRATION N/A 07/29/2022   Procedure: FINE NEEDLE ASPIRATION (FNA) LINEAR;  Surgeon:  Lemar Lofty., MD;  Location: [facility];  Service: Gastroenterology;  Laterality: N/A;   PARTIAL KNEE ARTHROPLASTY Left 02/25/2020   Procedure: UNICOMPARTMENTAL KNEE;  Surgeon: Ollen Gross, MD;  Location: [facility];  Service: Orthopedics;  Laterality: Left;    REMOVAL OF STONES  03/29/2022   Procedure: REMOVAL OF STONES;  Surgeon: Iva Boop, MD;  Location: [facility];  Service: Gastroenterology;;   REMOVAL OF STONES  07/29/2022   Procedure: REMOVAL OF SLUDGE;  Surgeon: Lemar Lofty., MD;  Location: [facility];  Service: Gastroenterology;;  swept duct   SPHINCTEROTOMY  03/29/2022   Procedure: Dennison Mascot;  Surgeon: Iva Boop, MD;  Location: [facility];  Service: Gastroenterology;;   Burman Freestone CHOLANGIOSCOPY N/A 07/29/2022   Procedure: HQIONGEX CHOLANGIOSCOPY;  Surgeon: Lemar Lofty., MD;  Location: [facility];  Service: Gastroenterology;  Laterality: N/A;   STENT REMOVAL  07/29/2022   Procedure: STENT REMOVAL;  Surgeon: Meridee Score Netty Starring., MD;  Location: [facility];  Service: Gastroenterology;;    HEMATOLOGY/ONCOLOGY HISTORY:  Oncology History Overview Note   Cancer Staging  Primary cholangiocarcinoma of extrahepatic bile duct (HCC) Staging form: Perihilar Bile Ducts, AJCC 8th Edition - Clinical: Stage Unknown (cTX, cN0, cM0) - Signed by Malachy Mood, MD on 08/26/2022     Primary cholangiocarcinoma of extrahepatic bile duct (HCC)  07/29/2022 Pathology Results     FINAL MICROSCOPIC DIAGNOSIS:   A.  COMMON HEPATIC DUCT, STRICTURE, BIOPSY:  Moderately differentiated adenocarcinoma     08/11/2022 Initial Diagnosis   Primary cholangiocarcinoma of extrahepatic bile duct (HCC)   08/17/2022 Imaging   BRAIN MR W WO CONTRAST   IMPRESSION: 1. No acute intracranial abnormality or significant interval change. 2. Generalized atrophy and white matter disease is mildly advanced for age. This likely reflects the sequela of chronic  microvascular ischemia. 3. 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 4. Linear lacunar infarcts of the cerebellum are new since the prior exam but not acute.    08/26/2022 Cancer Staging   Staging form: Perihilar Bile Ducts, AJCC 8th Edition - Clinical: Stage Unknown (cTX, cN0, cM0) - Signed by Malachy Mood, MD on 08/26/2022     ALLERGIES:  is allergic to phenazopyridine, pyridium [phenazopyridine hcl], sulfamethoxazole-trimethoprim, sulfa antibiotics, ciprofloxacin hcl, esomeprazole magnesium, ferrous sulfate, macrodantin [nitrofurantoin macrocrystal], pantoprazole sodium, promethazine, and tramadol.  MEDICATIONS:  Current Outpatient Medications  Medication Sig Dispense Refill   acetaminophen-codeine (TYLENOL #3) 300-30 MG tablet Take 1 tablet by mouth every 8 (eight) hours as needed for moderate pain. 20 tablet 0   glucose blood (ACCU-CHEK AVIVA PLUS) test strip Use to check blood sugar 3 times daily. 90 strip 5   levothyroxine (SYNTHROID, LEVOTHROID) 75 MCG tablet Take 75 mcg by mouth daily.     losartan (COZAAR) 50 MG tablet Take 50 mg by mouth daily.     Omega-3 Fatty Acids (FISH OIL PO) Take 1 tablet by mouth daily.     omeprazole (PRILOSEC) 40 MG capsule Take 1 capsule (40 mg total) by mouth daily. (Patient taking differently: Take 40 mg by mouth 2 (two) times daily.) 30 capsule 1   ondansetron (ZOFRAN) 4 MG tablet Take 1 tablet (4 mg total) by mouth  every 6 (six) hours as needed for nausea. 20 tablet 0   pravastatin (PRAVACHOL) 80 MG tablet Take 80 mg by mouth daily.     sitaGLIPtan-metformin (JANUMET) 50-500 MG per tablet Take 1 tablet by mouth 2 (two) times daily with a meal.     No current facility-administered medications for this visit.    VITAL SIGNS: There were no vitals taken for this visit. There were no vitals filed for this visit.  Estimated body mass index is 19.96 kg/m as calculated  from the following:   Height as of 10/11/22: 5' (1.524 m).   Weight as of 10/11/22: 102 lb 3.2 oz (46.4 kg).  LABS: CBC:    Component Value Date/Time   WBC 7.0 10/11/2022 0905   HGB 10.8 (L) 10/11/2022 0905   HGB 10.2 (L) 08/11/2022 1449   HGB 11.9 09/20/2016 1013   HCT 30.8 (L) 10/11/2022 0905   HCT 37.2 09/20/2016 1013   PLT 298 10/11/2022 0905   PLT 258 08/11/2022 1449   PLT 181 09/20/2016 1013   MCV 87.5 10/11/2022 0905   MCV 98 (H) 09/20/2016 1013   NEUTROABS 4.3 10/11/2022 0905   NEUTROABS 3.9 09/20/2016 1013   LYMPHSABS 2.2 10/11/2022 0905   LYMPHSABS 2.3 09/20/2016 1013   MONOABS 0.3 10/11/2022 0905   EOSABS 0.1 10/11/2022 0905   EOSABS 0.1 09/20/2016 1013   BASOSABS 0.1 10/11/2022 0905   BASOSABS 0.0 09/20/2016 1013   Comprehensive Metabolic Panel:    Component Value Date/Time   NA 127 (L) 10/11/2022 0905   K 3.8 10/11/2022 0905   CL 96 (L) 10/11/2022 0905   CO2 23 10/11/2022 0905   BUN 12 10/11/2022 0905   CREATININE 0.56 10/11/2022 0905   CREATININE 0.56 08/11/2022 1449   CREATININE 0.58 (L) 04/28/2022 1020   GLUCOSE 211 (H) 10/11/2022 0905   CALCIUM 9.1 10/11/2022 0905   CALCIUM 8.7 04/15/2011 1003   AST 28 10/11/2022 0905   AST 20 08/11/2022 1449   ALT 49 (H) 10/11/2022 0905   ALT 21 08/11/2022 1449   ALKPHOS 169 (H) 10/11/2022 0905   BILITOT 0.3 10/11/2022 0905   BILITOT 0.3 08/11/2022 1449   PROT 6.7 10/11/2022 0905   ALBUMIN 3.8 10/11/2022 0905    RADIOGRAPHIC STUDIES: No results found.  PERFORMANCE STATUS (ECOG) : 1 - Symptomatic but completely ambulatory  Review of Systems  Constitutional:  Positive for activity change, appetite change and fatigue.  Gastrointestinal:  Positive for abdominal pain.  Musculoskeletal:  Positive for back pain.  Neurological:  Positive for weakness.  Unless otherwise noted, a complete review of systems is negative.  Physical Exam General: NAD, weakness Cardiovascular: regular rate and rhythm Pulmonary:  clear ant fields Abdomen: soft, right flank tender, + bowel sounds Extremities: no edema, no joint deformities Skin: no rashes Neurological:AAO x4 with interpretation  IMPRESSION: This is my initial visit with Lauren [name]. Her daughter, Mando is present in addition to [place] interpretor. Patient is ambulatory. Alert and able to engage appropriately in discussions.   I introduced myself, [place] RN, and Palliative's role in collaboration with the oncology team. Concept of Palliative Care was introduced as specialized medical care for people and their families living with serious illness.  It focuses on providing relief from the symptoms and stress of a serious illness.  The goal is to improve quality of life for both the patient and the family. Values and goals of care important to patient and family were attempted to be elicited.   Lauren [name] lives in the home alone. At this time her daughter is currently staying with her who is here from [place]. She has 3 children and 3 grandchildren. 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  Originally from [place] and has been here in the [place] since 1985.  Patient reports up to 5-6 months prior she was independent of all ADLs including ability to drive and care for herself.  Shares this is a big life altering change for her.  Self catheterizes.  She now requires some assistance due to fatigue and weakness.  Appetite fluctuates.  Some days are better than others.  Denies constipation or diarrhea.  Patient was previously under hospice care however his recently rescinded.  Neoplasm related pain Lauren [name] endorses right flank pain that radiates across her abdomen and sometimes extends into her lower back.  Describes pain as a constant ache, sharp, and pressure.  Also complains of ongoing headaches.  Daughter states at one point patient was able to control headaches with Tylenol however this is no longer working. Patient states nothing makes pain better.  We reviewed her current medications as  patient has an extensive list of allergies.  Daughter shares patient was recently taking Tylenol No. 3 however this did not provide any relief.  No relief with tramadol.  And unfortunately she was started on morphine 15 mg per hospice and family felt this was too strong for patient.  Daughter states patient became increasingly agitated as well as lethargic at times.  States it took 2-3 days for medication to get out of her system and patient required 24/7 around-the-clock monitoring due to her intolerance.  She complained of feeling of heaviness, anxiety, dizziness, blurred vision, and drowsiness.  They would not like to take this medicine again in the future.  We discussed at length patient's abdominal pain and headaches with her understanding of the goal of managing her pain and getting her to a better and more comfortable place.  Her daughter verbalizes understanding that we may not be able to completely resolve her pain however again the focus is for comfort and improvement of her quality of life.  Patient also verbalized understanding.  Extensive medication review and history has been performed including allergies.  Patient has previously tolerated oxycodone in the past.  After further discussions patient and daughter is agreeable to reattempt use.  We discussed starting a lower dose to observe tolerance.  Education provided on use of Roxicodone every 6 hours as needed for abdominal pain and headaches.  Patient also aware she may continue to take Tylenol every 8 hours as needed.  Education provided on the potential side effects of Roxicodone, taking foods on an empty stomach, and use of antiemetics in the setting of nausea.  All questions answered.  Will continue to closely monitor.  Patient and family knows to contact office with any needs.  Decreased appetite/nausea Family reports appetite fluctuates.  Some days are better than others.  Daughter states a noticeable decrease in her appetite.   Patient will generally eat in moderation foods that she enjoys.  Lauren [name] states over the past week she has experienced some nausea.  This has limited her appetite and desire for foods.  Education provided on the use of antiemetics.  Encouraged patient to focus on small frequent meals, eating as she desires, and eating foods that she enjoys.  Goals of care  We discussed her current illness and what it means in the larger context of her on-going co-morbidities. Natural disease trajectory and expectations  were discussed.  Patient and her daughter are realistic and understanding of her cancer diagnosis and prognosis.  They are clear in their expressed wishes to manage her symptoms aggressively allow her every opportunity to continue to thrive and minimize any suffering or symptoms as best possible.  They continue to wish to follow-up with palliative here at the cancer center and defer restarting hospice services at this time.  They understand this can be restarted in anytime by discussing with their medical team.  I discussed the importance of continued conversation with family and their medical providers regarding overall plan of care and treatment options, ensuring decisions are within the context of the patients values and GOCs.  PLAN: Established therapeutic relationship. Education provided on palliative's role in collaboration with their Oncology/Radiation team. Roxicodone 5 mg every 6 hours as needed for pain Tylenol ES every 8 hours as needed MiraLAX daily for bowel regimen Zofran as needed for nausea Omeprazole daily Ongoing symptom management and goals of care support I will plan to see patient back in 1-2 weeks in collaboration to other oncology appointments.    Patient expressed understanding and was in agreement with this plan. She also understands that She can call the clinic at any time with any questions, concerns, or complaints.   Thank you for your referral and allowing Palliative  to assist in Mrs. Samarrah Y [name]'s care.   Number and complexity of problems addressed: HIGH - 1 or more chronic illnesses with SEVERE exacerbation, progression, or side effects of treatment - advanced cancer, pain. Any controlled substances utilized were prescribed in the context of palliative care.   Visit consisted of counseling and education dealing with the complex and emotionally intense issues of symptom management and palliative care in the setting of serious and potentially life-threatening illness.Greater than 50%  of this time was spent counseling and coordinating care related to the above assessment and plan.  Signed by: Willette Alma, AGPCNP-BC Palliative Medicine Team/[facility]   *Please note that this is a verbal dictation therefore any spelling or grammatical errors are due to the "[employer]" system interpretation.

## 2022-10-19 NOTE — Patient Instructions (Addendum)
-   take tylenol 1000mg  (2 pills that are 500mg  each) taking every 8 hours as needed to help with pain  - take advil 600mg  every 8 hours to help with pain as needed, alternate with the tylenol, to get more coverage - stop taking your tylenol #3 (the tylenol with the codeine)  - start taking your oxycodone for pain, take every 6 hours as needed for pain, take with food to prevent nausea - take your ondansetron (nausea medication) 30 minutes before your oxycodone to help prevent nausea - continue your omeprazole to protect your stomach - take your mirilax daily to prevent constipation  - if you develop sudden chest pain or shortness of breath call 911, if you develop a rash or have any other concerns call the office at [phone]

## 2022-10-20 ENCOUNTER — Telehealth: Payer: Self-pay

## 2022-10-20 ENCOUNTER — Encounter: Payer: Self-pay | Admitting: Hematology

## 2022-10-20 NOTE — Telephone Encounter (Signed)
Pt daughter called reporting that the pt is experiencing nausea with the oxycodone pills. RN educated to take to take Zofran 30 mins before oxy to help with nausea as discussed during appt yesterday. Also instructed to increase to 2 Zofran pills (8mg  total) as needed for nausea. Pt daughter verbalized understanding and no further concerns at this time.

## 2022-10-21 ENCOUNTER — Inpatient Hospital Stay (HOSPITAL_BASED_OUTPATIENT_CLINIC_OR_DEPARTMENT_OTHER): Admitting: Internal Medicine

## 2022-10-21 VITALS — BP 145/70 | HR 79 | Temp 97.7°F | Resp 15 | Wt 103.4 lb

## 2022-10-21 DIAGNOSIS — R4189 Other symptoms and signs involving cognitive functions and awareness: Secondary | ICD-10-CM | POA: Diagnosis not present

## 2022-10-21 DIAGNOSIS — G4719 Other hypersomnia: Secondary | ICD-10-CM | POA: Diagnosis not present

## 2022-10-21 DIAGNOSIS — G893 Neoplasm related pain (acute) (chronic): Secondary | ICD-10-CM | POA: Diagnosis not present

## 2022-10-21 DIAGNOSIS — C240 Malignant neoplasm of extrahepatic bile duct: Secondary | ICD-10-CM | POA: Diagnosis present

## 2022-10-21 DIAGNOSIS — R519 Headache, unspecified: Secondary | ICD-10-CM | POA: Diagnosis not present

## 2022-10-21 MED ORDER — PREDNISONE 50 MG PO TABS: 50.0000 mg | ORAL_TABLET | Freq: Every day | ORAL | 0 refills | Status: DC

## 2022-10-21 NOTE — Progress Notes (Signed)
[facility] at [facility] [address]  [place], [place] [address] [phone]   New Patient Evaluation  Date of Service: 10/21/22 Patient Name: Lauren [name] Patient MRN: [id number] Patient DOB: 06-22-1940 Provider: Henreitta Leber, MD  Identifying Statement:  Lauren [name] is a 82 y.o. female with Chronic daily headache - Plan: Nocturnal polysomnography  Excessive daytime sleepiness - Plan: Nocturnal polysomnography  Cognitive changes - Plan: Nocturnal polysomnography who presents for initial consultation and evaluation regarding cancer associated neurologic deficits.    Referring Provider: Pearson Grippe, MD [address] [place],  [place] [address]  Primary Cancer:  Oncologic History: Oncology History Overview Note   Cancer Staging  Primary cholangiocarcinoma of extrahepatic bile duct (HCC) Staging form: Perihilar Bile Ducts, AJCC 8th Edition - Clinical: Stage Unknown (cTX, cN0, cM0) - Signed by Malachy Mood, MD on 08/26/2022     Primary cholangiocarcinoma of extrahepatic bile duct (HCC)  07/29/2022 Pathology Results     FINAL MICROSCOPIC DIAGNOSIS:   A.  COMMON HEPATIC DUCT, STRICTURE, BIOPSY:  Moderately differentiated adenocarcinoma     08/11/2022 Initial Diagnosis   Primary cholangiocarcinoma of extrahepatic bile duct (HCC)   08/17/2022 Imaging   BRAIN MR W WO CONTRAST   IMPRESSION: 1. No acute intracranial abnormality or significant interval change. 2. Generalized atrophy and white matter disease is mildly advanced for age. This likely reflects the sequela of chronic microvascular ischemia. 3. 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 4. Linear lacunar infarcts of the cerebellum are new since the prior exam but not acute.    08/26/2022 Cancer Staging   Staging form: Perihilar Bile Ducts, AJCC 8th Edition - Clinical: Stage Unknown (cTX,  cN0, cM0) - Signed by Malachy Mood, MD on 08/26/2022     History of Present Illness: The patient's records from the referring physician were obtained and reviewed and the patient interviewed to confirm this HPI.  Lauren [name] presents today to review headache syndrome.  She describes holocranial pressure, pounding associated with nausea, moderate to severe.  Onset was ~4 months ago, frequency is daily.  She has no notable headache history, denies any focal neurologic symptoms.  Has had some poor reaction to pain medications including morphine (previously on home hospice).  Currently dosing Tylenol and some oxycodone recently, but no more than 1 month of daily pain medication use.  Does acknowledge heavy snoring (per daughter), severe sleepiness during the day, recent decline in memory and cognition.  Medications: Current Outpatient Medications on File Prior to Visit  Medication Sig Dispense Refill   glucose blood (ACCU-CHEK AVIVA PLUS) test strip Use to check blood sugar 3 times daily. 90 strip 5   levothyroxine (SYNTHROID, LEVOTHROID) 75 MCG tablet Take 75 mcg by mouth daily.     losartan (COZAAR) 50 MG tablet Take 50 mg by mouth daily.     Omega-3 Fatty Acids (FISH OIL PO) Take 1 tablet by mouth daily.     omeprazole (PRILOSEC) 40 MG capsule Take 1 capsule (40 mg total) by mouth daily. (Patient taking differently: Take 40 mg by mouth 2 (two) times daily.) 30 capsule 1   ondansetron (ZOFRAN) 4 MG tablet Take 1 tablet (4 mg total) by mouth every 6 (six) hours as needed for nausea. 20 tablet 0   oxyCODONE (ROXICODONE) 5 MG/5ML solution Take 5 mLs (5 mg total) by mouth every 6 (six) hours as  needed for severe pain or moderate pain. 240 mL 0   pravastatin (PRAVACHOL) 80 MG tablet Take 80 mg by mouth daily.     sitaGLIPtan-metformin (JANUMET) 50-500 MG per tablet Take 1 tablet by mouth 2 (two) times daily with a meal.     No current facility-administered medications on file prior to visit.     Allergies:  Allergies  Allergen Reactions   Phenazopyridine Anaphylaxis   Pyridium [Phenazopyridine Hcl] Other (See Comments)    Hemolytic anemia   Sulfamethoxazole-Trimethoprim Anaphylaxis   Sulfa Antibiotics     Potential allergy: oxidizing drug: methemoglobinemia/hemolysis on pyridium   Ciprofloxacin Hcl     Sx's almost like a heart attack   Esomeprazole Magnesium Nausea Only    Pain and nausea   Ferrous Sulfate Other (See Comments)   Macrodantin [Nitrofurantoin Macrocrystal] Other (See Comments)    GI Upset   Pantoprazole Sodium Nausea Only    abd pain and nausea   Promethazine Diarrhea   Tramadol Nausea Only   Past Medical History:  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   Possible related to pyridium  G6PD studies pending   Hemorrhoids    Hiatal hernia    Hyperlipidemia    Hyperlipidemia    Hypertension    Hypothyroidism    IBS (irritable bowel syndrome)    Iron overload 07/13/2016   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   Osteoporosis    UTI (lower urinary tract infection)    Vitamin D deficiency    Past Surgical History:  Past Surgical History:  Procedure Laterality Date   BILIARY BRUSHING  06/29/2022   Procedure: BILIARY BRUSHING;  Surgeon: Hilarie Fredrickson, MD;  Location: [facility];  Service: Gastroenterology;;   BILIARY BRUSHING  07/29/2022   Procedure: BILIARY BRUSHING;  Surgeon: Lemar Lofty., MD;  Location: [facility];  Service: Gastroenterology;;   BILIARY STENT PLACEMENT N/A 06/29/2022   Procedure: BILIARY STENT PLACEMENT;  Surgeon: Hilarie Fredrickson, MD;  Location: [facility];  Service: Gastroenterology;  Laterality: N/A;   BILIARY STENT PLACEMENT N/A 07/29/2022   Procedure: BILIARY STENT PLACEMENT;   Surgeon: Meridee Score Netty Starring., MD;  Location: [facility];  Service: Gastroenterology;  Laterality: N/A;   BIOPSY  07/29/2022   Procedure: BIOPSY;  Surgeon: Meridee Score Netty Starring., MD;  Location: [facility];  Service: Gastroenterology;;  spybite   CHOLECYSTECTOMY N/A 03/31/2022   Procedure: LAPAROSCOPIC CHOLECYSTECTOMY;  Surgeon: Axel Filler, MD;  Location: [facility];  Service: General;  Laterality: N/A;   COLONOSCOPY     ENDOSCOPIC RETROGRADE CHOLANGIOPANCREATOGRAPHY (ERCP) WITH PROPOFOL N/A 07/29/2022   Procedure: ENDOSCOPIC RETROGRADE CHOLANGIOPANCREATOGRAPHY (ERCP) WITH PROPOFOL;  Surgeon: Lemar Lofty., MD;  Location: [facility];  Service: Gastroenterology;  Laterality: N/A;   ERCP N/A 03/29/2022   Procedure: ENDOSCOPIC RETROGRADE CHOLANGIOPANCREATOGRAPHY (ERCP);  Surgeon: Iva Boop, MD;  Location: [facility];  Service: Gastroenterology;  Laterality: N/A;   ERCP N/A 06/29/2022   Procedure: ENDOSCOPIC RETROGRADE CHOLANGIOPANCREATOGRAPHY (ERCP);  Surgeon: Hilarie Fredrickson, MD;  Location: [facility];  Service: Gastroenterology;  Laterality: N/A;   ESOPHAGOGASTRODUODENOSCOPY N/A 07/29/2022   Procedure: ESOPHAGOGASTRODUODENOSCOPY (EGD);  Surgeon: Lemar Lofty., MD;  Location: [facility];  Service: Gastroenterology;  Laterality: N/A;   EUS N/A 07/29/2022   Procedure: UPPER ENDOSCOPIC ULTRASOUND (EUS) RADIAL;  Surgeon: Lemar Lofty., MD;  Location: [facility];  Service: Gastroenterology;  Laterality: N/A;   FINE NEEDLE ASPIRATION N/A 07/29/2022   Procedure: FINE NEEDLE ASPIRATION (FNA) LINEAR;  Surgeon: Lemar Lofty., MD;  Location: [facility];  Service: Gastroenterology;  Laterality: N/A;   PARTIAL KNEE ARTHROPLASTY Left 02/25/2020   Procedure: UNICOMPARTMENTAL KNEE;  Surgeon: Ollen Gross, MD;  Location: [facility];  Service: Orthopedics;  Laterality: Left;    REMOVAL OF STONES  03/29/2022   Procedure: REMOVAL OF STONES;  Surgeon:  Iva Boop, MD;  Location: [facility];  Service: Gastroenterology;;   REMOVAL OF STONES  07/29/2022   Procedure: REMOVAL OF SLUDGE;  Surgeon: Lemar Lofty., MD;  Location: [facility];  Service: Gastroenterology;;  swept duct   SPHINCTEROTOMY  03/29/2022   Procedure: Dennison Mascot;  Surgeon: Iva Boop, MD;  Location: [facility];  Service: Gastroenterology;;   Burman Freestone CHOLANGIOSCOPY N/A 07/29/2022   Procedure: NUUVOZDG CHOLANGIOSCOPY;  Surgeon: Lemar Lofty., MD;  Location: [facility];  Service: Gastroenterology;  Laterality: N/A;   STENT REMOVAL  07/29/2022   Procedure: STENT REMOVAL;  Surgeon: Lemar Lofty., MD;  Location: [facility];  Service: Gastroenterology;;   Social History:  Social History   Socioeconomic History   Marital status: Widowed    Spouse name: Not on file   Number of children: 3   Years of education: Not on file   Highest education level: Not on file  Occupational History   Occupation: retired    Associate Professor: RETIRED  Tobacco Use   Smoking status: Never   Smokeless tobacco: Never  Substance and Sexual Activity   Alcohol use: No   Drug use: No   Sexual activity: Never  Other Topics Concern   Not on file  Social History Narrative   Not on file   Social Determinants of Health   Financial Resource Strain: Not on file  Food Insecurity: No Food Insecurity (06/28/2022)   Hunger Vital Sign    Worried About Running Out of Food in the Last Year: Never true    Ran Out of Food in the Last Year: Never true  Transportation Needs: No Transportation Needs (06/28/2022)   PRAPARE - Administrator, Civil Service (Medical): No    Lack of Transportation (Non-Medical): No  Physical Activity: Not on file  Stress: Not on file  Social Connections: Not on file  Intimate Partner Violence: Not At Risk (06/28/2022)   Humiliation, Afraid, Rape, and Kick questionnaire    Fear of Current or Ex-Partner: No    Emotionally  Abused: No    Physically Abused: No    Sexually Abused: No   Family History:  Family History  Problem Relation Age of Onset   Colon cancer Neg Hx     Review of Systems: Constitutional: Doesn't report fevers, chills or abnormal weight loss Eyes: Doesn't report blurriness of vision Ears, nose, mouth, throat, and face: Doesn't report sore throat Respiratory: Doesn't report cough, dyspnea or wheezes Cardiovascular: Doesn't report palpitation, chest discomfort  Gastrointestinal:  Doesn't report nausea, constipation, diarrhea GU: Doesn't report incontinence Skin: Doesn't report skin rashes Neurological: Per HPI Musculoskeletal: Doesn't report joint pain Behavioral/Psych: Doesn't report anxiety  Physical Exam: Vitals:   10/21/22 1000  BP: (!) 145/70  Pulse: 79  Resp: 15  Temp: 97.7 F (36.5 C)  SpO2: 100%   KPS: 80. General: Alert, cooperative, pleasant,  in no acute distress Head: Normal EENT: No conjunctival injection or scleral icterus.  Lungs: Resp effort normal Cardiac: Regular rate Abdomen: Non-distended abdomen Skin: No rashes cyanosis or petechiae. Extremities: No clubbing or edema  Neurologic Exam: Mental Status: Awake, alert, attentive to examiner. Oriented to self and environment. Language is fluent with intact comprehension.  Cranial Nerves: Visual acuity is grossly normal. Visual fields are full. Extra-ocular movements intact. No ptosis. Face is symmetric Motor: Tone and bulk are normal. Power is full in both arms and legs. Reflexes are symmetric, no pathologic reflexes present.  Sensory: Intact to light touch Gait: Normal.   Labs: I have reviewed the data as listed    Component Value Date/Time   NA 127 (L) 10/11/2022 0905   K 3.8 10/11/2022 0905   CL 96 (L) 10/11/2022 0905   CO2 23 10/11/2022 0905   GLUCOSE 211 (H) 10/11/2022 0905   BUN 12 10/11/2022 0905   CREATININE 0.56 10/11/2022 0905   CREATININE 0.56 08/11/2022 1449   CREATININE 0.58 (L)  04/28/2022 1020   CALCIUM 9.1 10/11/2022 0905   CALCIUM 8.7 04/15/2011 1003   PROT 6.7 10/11/2022 0905   ALBUMIN 3.8 10/11/2022 0905   AST 28 10/11/2022 0905   AST 20 08/11/2022 1449   ALT 49 (H) 10/11/2022 0905   ALT 21 08/11/2022 1449   ALKPHOS 169 (H) 10/11/2022 0905   BILITOT 0.3 10/11/2022 0905   BILITOT 0.3 08/11/2022 1449   GFRNONAA >60 10/11/2022 0905   GFRNONAA >60 08/11/2022 1449   GFRAA >60 07/27/2016 0905   Lab Results  Component Value Date   WBC 7.0 10/11/2022   NEUTROABS 4.3 10/11/2022   HGB 10.8 (L) 10/11/2022   HCT 30.8 (L) 10/11/2022   MCV 87.5 10/11/2022   PLT 298 10/11/2022    Imaging: CLINICAL DATA:  New onset headaches.  Pressure and pain.   EXAM: MRI HEAD WITHOUT AND WITH CONTRAST   TECHNIQUE: Multiplanar, multiecho pulse sequences of the brain and surrounding structures were obtained without and with intravenous contrast.   CONTRAST:  4.62mL GADAVIST GADOBUTROL 1 MMOL/ML IV SOLN   COMPARISON:  MR head without and with contrast 12/28/2017.   FINDINGS: Brain: Generalized atrophy and periventricular white matter changes are mildly advanced for age in advanced slightly since the prior exam. The remote lacunar infarct involving the right caudate head and anterior limb of the right internal capsule is new since the prior exam. The linear lacunar infarct of the left lentiform nucleus is also new since the prior exam but not acute. No acute infarct, hemorrhage, or mass lesion is present. The ventricles are proportionate to the degree of atrophy. No significant extraaxial fluid collection is present. Basal ganglia are otherwise within normal limits.   Linear lacunar infarcts of the cerebellum are new since the prior exam but not acute. Brainstem and cerebellum are otherwise within normal limits. The internal auditory canals are within normal limits. 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 Midline structures are otherwise unremarkable.    Postcontrast images demonstrate no pathologic enhancement.   Vascular: Flow is present in the major intracranial arteries.   Skull and upper cervical spine: The craniocervical junction is normal. Upper cervical spine is within normal limits. Marrow signal is unremarkable.   Sinuses/Orbits: The paranasal sinuses and mastoid air cells are clear. The globes and orbits are within normal limits.   IMPRESSION: 1. No acute intracranial abnormality or significant interval change. 2. Generalized atrophy and white matter disease is mildly advanced for age.  This likely reflects the sequela of chronic microvascular ischemia. 3. 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 4. Linear lacunar infarcts of the cerebellum are new since the prior exam but not acute.     Electronically Signed   By: Marin Roberts M.D.   On: 08/26/2022 08:55   Assessment/Plan Chronic daily headache - Plan: Nocturnal polysomnography  Excessive daytime sleepiness - Plan: Nocturnal polysomnography  Cognitive changes - Plan: Nocturnal polysomnography  Lauren [name] presents with chronic daily headache.  Etiology is unclear, but there is reasonable suspicion for sleep disordered breathing based on constellation of symptoms (snoring, daytime sleepiness, cognitive decline, concurrent cancer diagnosis).    We recommended obtaining PSG sleep study for further evaluation.   In meantime, will proceed with trial of prednisone 50mg  daily x7 days to help bridge off of some analgesia to less than daily.    No other significant pain syndrome at this time, just headaches.    We spent twenty additional minutes teaching regarding the natural history, biology, and historical experience in the treatment of neurologic complications of cancer.   We appreciate the opportunity to participate in the care of Lauren [name].   We ask that Lauren [name]  return to clinic following sleep study, or sooner as needed.  All questions were answered. The patient knows to call the clinic with any problems, questions or concerns. No barriers to learning were detected.  The total time spent in the encounter was 40 minutes and more than 50% was on counseling and review of test results   Henreitta Leber, MD Medical Director of Neuro-Oncology [facility] at [facility] 10/21/22 11:17 AM

## 2022-10-25 NOTE — Progress Notes (Unsigned)
Palliative Medicine [facility]  [phone] [phone]   Name: Lauren [name] Date: 10/25/2022 MRN: [id number]  DOB: [birth year]  Patient Care Team: Pearson Grippe, MD as PCP - General (Internal Medicine) Pyrtle, Carie Caddy, MD as Consulting Physician (Gastroenterology) Malachy Mood, MD as Consulting Physician (Oncology)   I connected with Kathrin Ruddy on 10/25/22 at 12:00 PM EDT by phone and verified that I am speaking with the correct person using two identifiers.   I discussed the limitations, risks, security and privacy concerns of performing an evaluation and management service by telemedicine and the availability of in-person appointments. I also discussed with the patient that there may be a patient responsible charge related to this service. The patient expressed understanding and agreed to proceed.   Other persons participating in the visit and their role in the encounter: n/a   Patient's location: home  Provider's location: [facility]     Chief Complaint: f/u of symptom management   INTERVAL HISTORY: Lauren [name] is a 82 y.o. 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 Patient was previously under hospice services and has rescinded those services, palliative asked to see for symptom management and goals of care.   SOCIAL HISTORY:     reports that she has never smoked. She has never used smokeless tobacco. She reports that she does not drink alcohol and does not use drugs.  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   Possible related to pyridium  G6PD studies pending   Hemorrhoids    Hiatal hernia    Hyperlipidemia    Hyperlipidemia    Hypertension    Hypothyroidism    IBS (irritable bowel syndrome)    Iron overload 07/13/2016   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   Osteoporosis    UTI (lower urinary tract infection)    Vitamin D deficiency     ALLERGIES:  is allergic to phenazopyridine, pyridium [phenazopyridine hcl], sulfamethoxazole-trimethoprim, sulfa antibiotics, ciprofloxacin hcl, esomeprazole magnesium, ferrous sulfate, macrodantin [nitrofurantoin macrocrystal], pantoprazole sodium, promethazine, and tramadol.  MEDICATIONS:  Current Outpatient Medications  Medication Sig Dispense Refill   glucose blood (ACCU-CHEK AVIVA PLUS) test strip Use to check blood sugar 3 times daily. 90 strip 5   levothyroxine (SYNTHROID, LEVOTHROID) 75 MCG tablet Take 75 mcg by mouth daily.     losartan (COZAAR) 50 MG tablet Take 50 mg by mouth daily.     Omega-3 Fatty Acids (FISH OIL PO) Take 1 tablet by mouth daily.     omeprazole (PRILOSEC) 40 MG capsule Take 1 capsule (40 mg total) by mouth daily. (Patient taking differently: Take 40 mg by mouth 2 (two) times daily.) 30 capsule 1   ondansetron (ZOFRAN) 4 MG tablet Take 1 tablet (4 mg total) by mouth every 6 (six) hours as needed for nausea. 20 tablet 0   oxyCODONE (ROXICODONE) 5 MG/5ML solution Take 5 mLs (5 mg total) by mouth every 6 (six) hours as needed for severe  pain or moderate pain. 240 mL 0   pravastatin (PRAVACHOL) 80 MG tablet Take 80 mg by mouth daily.     predniSONE (DELTASONE) 50 MG tablet Take 1 tablet (50 mg total) by mouth daily with breakfast. 7 tablet 0   sitaGLIPtan-metformin (JANUMET) 50-500 MG per tablet Take 1 tablet by mouth 2 (two) times daily with a meal.     No current facility-administered medications for this visit.    VITAL SIGNS: There were no vitals taken for this visit. There were no  vitals filed for this visit.  Estimated body mass index is 20.19 kg/m as calculated from the following:   Height as of 10/19/22: 5' (1.524 m).   Weight as of 10/21/22: 103 lb 6.4 oz (46.9 kg).   PERFORMANCE STATUS (ECOG) : 1 - Symptomatic but completely ambulatory   Physical Exam General: NAD Cardiovascular: regular rate and rhythm Pulmonary: clear ant fields Abdomen: soft, nontender, + bowel sounds Extremities: no edema, no joint deformities Skin: no rashes Neurological:   IMPRESSION: Neoplasm related pain Ms. Fougerousse endorses right flank pain that radiates across her abdomen and sometimes extends into her lower back.  Describes pain as a constant ache, sharp, and pressure.  Also complains of ongoing headaches.  Daughter states at one point patient was able to control headaches with Tylenol however this is no longer working. Patient states nothing makes pain better.  We reviewed her current medications as patient has an extensive list of allergies.   Daughter shares patient was recently taking Tylenol No. 3 however this did not provide any relief.  No relief with tramadol.  And unfortunately she was started on morphine 15 mg per hospice and family felt this was too strong for patient.  Daughter states patient became increasingly agitated as well as lethargic at times.  States it took 2-3 days for medication to get out of her system and patient required 24/7 around-the-clock monitoring due to her intolerance.  She complained of feeling of heaviness, anxiety, dizziness, blurred vision, and drowsiness.  They would not like to take this medicine again in the future.   We discussed at length patient's abdominal pain and headaches with her understanding of the goal of managing her pain and getting her to a better and more comfortable place.  Her daughter verbalizes understanding that we may not be able to completely resolve her pain however again the focus is for comfort and improvement of her quality of  life.  Patient also verbalized understanding.   Extensive medication review and history has been performed including allergies.  Patient has previously tolerated oxycodone in the past.  After further discussions patient and daughter is agreeable to reattempt use.  We discussed starting a lower dose to observe tolerance.  Education provided on use of Roxicodone every 6 hours as needed for abdominal pain and headaches.  Patient also aware she may continue to take Tylenol every 8 hours as needed.  Education provided on the potential side effects of Roxicodone, taking foods on an empty stomach, and use of antiemetics in the setting of nausea.   All questions answered.  Will continue to closely monitor.  Patient and family knows to contact office with any needs.   Decreased appetite/nausea Family reports appetite fluctuates.  Some days are better than others.  Daughter states a noticeable decrease in her appetite.  Patient will generally eat in moderation foods that she enjoys.  Ms. Lozoya states over the past week she has experienced some nausea.  This has  limited her appetite and desire for foods.  Education provided on the use of antiemetics.  Encouraged patient to focus on small frequent meals, eating as she desires, and eating foods that she enjoys.   Goals of care   8/6- We discussed her current illness and what it means in the larger context of her on-going co-morbidities. Natural disease trajectory and expectations were discussed.   Patient and her daughter are realistic and understanding of her cancer diagnosis and prognosis.  They are clear in their expressed wishes to manage her symptoms aggressively allow her every opportunity to continue to thrive and minimize any suffering or symptoms as best possible.  They continue to wish to follow-up with palliative here at the cancer center and defer restarting hospice services at this time.  They understand this can be restarted in anytime by discussing with  their medical team.  We discussed Her current illness and what it means in the larger context of Her on-going co-morbidities. Natural disease trajectory and expectations were discussed.  I discussed the importance of continued conversation with family and their medical providers regarding overall plan of care and treatment options, ensuring decisions are within the context of the patients values and GOCs.  PLAN: Established therapeutic relationship. Education provided on palliative's role in collaboration with their Oncology/Radiation team. Roxicodone 5 mg every 6 hours as needed for pain Tylenol ES every 8 hours as needed MiraLAX daily for bowel regimen Zofran as needed for nausea Omeprazole daily Ongoing symptom management and goals of care support I will plan to see patient back in 1-2 weeks in collaboration to other oncology appointments.    Patient expressed understanding and was in agreement with this plan. She also understands that She can call the clinic at any time with any questions, concerns, or complaints.   Any controlled substances utilized were prescribed in the context of palliative care. PDMP has been reviewed.    Visit consisted of counseling and education dealing with the complex and emotionally intense issues of symptom management and palliative care in the setting of serious and potentially life-threatening illness.Greater than 50%  of this time was spent counseling and coordinating care related to the above assessment and plan.  Willette Alma, AGPCNP-BC  Palliative Medicine Team/[facility]  *Please note that this is a verbal dictation therefore any spelling or grammatical errors are due to the "Dragon [employer] One" system interpretation.

## 2022-10-26 DIAGNOSIS — G893 Neoplasm related pain (acute) (chronic): Secondary | ICD-10-CM | POA: Diagnosis not present

## 2022-10-26 DIAGNOSIS — C240 Malignant neoplasm of extrahepatic bile duct: Secondary | ICD-10-CM | POA: Diagnosis present

## 2022-10-28 ENCOUNTER — Encounter: Payer: Self-pay | Admitting: Nurse Practitioner

## 2022-10-28 ENCOUNTER — Inpatient Hospital Stay (HOSPITAL_BASED_OUTPATIENT_CLINIC_OR_DEPARTMENT_OTHER): Admitting: Nurse Practitioner

## 2022-10-28 DIAGNOSIS — R530 Neoplastic (malignant) related fatigue: Secondary | ICD-10-CM

## 2022-10-28 DIAGNOSIS — G893 Neoplasm related pain (acute) (chronic): Secondary | ICD-10-CM

## 2022-10-28 DIAGNOSIS — Z515 Encounter for palliative care: Secondary | ICD-10-CM | POA: Diagnosis not present

## 2022-10-28 DIAGNOSIS — C240 Malignant neoplasm of extrahepatic bile duct: Secondary | ICD-10-CM | POA: Diagnosis not present

## 2022-11-01 DIAGNOSIS — R339 Retention of urine, unspecified: Secondary | ICD-10-CM | POA: Diagnosis not present

## 2022-11-01 NOTE — Assessment & Plan Note (Addendum)
cTxN0M0, MMR normal  -She initially presented to hospital in January 2024 with septic shock due to acute cholangitis and possible cholecystitis, status post cholecystectomy.  ERCP performed on March 29, 2022 and the cytology was negative. -She was readmitted to hospital in April 2024 with abdominal pain, nausea, and low appetite, CT and MRI showed increased diffuse dilatation of intrahepatic bile duct due to 2.1 cm soft tissue mass or lymphadenopathy in the portal hepatitis, concerning for cholangiocarcinoma.  She underwent repeated the ERCP, and stent placement, a malignant stricture was seen in the CBD, and a biopsy confirmed adenocarcinoma. -She has localized extrahepatic cholangiocarcinoma, likely in the hilar area.  No image evidence of metastasis. -she has met surgeon Dr. Modesta Messing. Surgery was offered but pt declined due to concerns of quality of life  -I discussed option of radiation, or systemic therapy, which will be palliative, not curative. -her tumor tested showed normal MMR, she is not a candidate for immunotherapy alone.   -Patient clearly indicates that she does not want any chemo or radiation, she does not want to prolong her life, quality of life is most important to her.  I referred her to home hospice in June 2024 -hospice care placed on hold due to desire for treatment for chronic daily headaches.  -she does continue routine visits with palliative care here at the cancer center for management of pain and headaches.  -patient's daughter feels like the patient may be getting depressed. States that patient is able to be more active, but no longer wants to go anywhere, especially in public.  -has noted her mom has been laying down and sleeping more often.

## 2022-11-01 NOTE — Assessment & Plan Note (Signed)
Consulted with Dr. Barbaraann Cao 10/21/2022 due to chronic daily headaches.  -she underwent MRI of the head with and without contrast with the following findings: -1. No acute intracranial abnormality or significant interval change. 2. Generalized atrophy and white matter disease is mildly advanced for age. This likely reflects the sequela of chronic microvascular ischemia. 3. 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 4. Linear lacunar infarcts of the cerebellum are new since the prior exam but not acute. She has completed a 7 day course of prednisone to help taper her off daily use of Tylenol and intermittent oxycodone.  She was referred for sleep study due to reported loud snoring, daytime sleepiness, and recent neurological decline.  Patient states that she did very well with prednisone taper. Headaches and nausea have both improved.  Waiting on referral to neurology for sleep study.  Continues to feel weak and very fatigued.

## 2022-11-01 NOTE — Progress Notes (Deleted)
Palliative Medicine [facility]  Telephone:(336) [phone] Fax:([phone]) [phone]   Name: Lauren [name] Date: 11/01/2022 MRN: [id number]  DOB: 1940/08/24  Patient Care Team: Pearson Grippe, MD as PCP - General (Internal Medicine) Pyrtle, Carie Caddy, MD as Consulting Physician (Gastroenterology) Malachy Mood, MD as Consulting Physician (Oncology)   INTERVAL HISTORY: Lauren [name] is a 82 y.o. 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 Patient was previously under hospice services and has rescinded those services, palliative asked to see for symptom management and goals of care.   SOCIAL HISTORY:     reports that she has never smoked. She has never used smokeless tobacco. She reports that she does not drink alcohol and does not use drugs.  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   Possible related to pyridium  G6PD studies pending   Hemorrhoids    Hiatal hernia    Hyperlipidemia    Hyperlipidemia    Hypertension    Hypothyroidism    IBS (irritable bowel syndrome)    Iron overload 07/13/2016   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   Osteoporosis    UTI (lower urinary tract infection)    Vitamin D deficiency     ALLERGIES:  is allergic to phenazopyridine, pyridium [phenazopyridine hcl], sulfamethoxazole-trimethoprim, sulfa antibiotics, ciprofloxacin hcl, esomeprazole magnesium, ferrous sulfate, macrodantin [nitrofurantoin macrocrystal], pantoprazole sodium, promethazine, and tramadol.  MEDICATIONS:   Current Outpatient Medications  Medication Sig Dispense Refill   glucose blood (ACCU-CHEK AVIVA PLUS) test strip Use to check blood sugar 3 times daily. 90 strip 5   levothyroxine (SYNTHROID, LEVOTHROID) 75 MCG tablet Take 75 mcg by mouth daily.     losartan (COZAAR) 50 MG tablet Take 50 mg by mouth daily.     Omega-3 Fatty Acids (FISH OIL PO) Take 1 tablet by mouth daily.     omeprazole (PRILOSEC) 40 MG capsule Take 1 capsule (40 mg total) by mouth daily. (Patient taking differently: Take 40 mg by mouth 2 (two) times daily.) 30 capsule 1   ondansetron (ZOFRAN) 4 MG tablet Take 1 tablet (4 mg total) by mouth every 6 (six) hours as needed for nausea. 20 tablet 0   oxyCODONE (ROXICODONE) 5 MG/5ML solution Take 5 mLs (5 mg total) by mouth every 6 (six) hours as needed for severe pain or moderate pain. 240 mL 0   pravastatin (PRAVACHOL) 80 MG tablet Take 80 mg by mouth daily.     predniSONE (DELTASONE) 50 MG tablet Take 1 tablet (50 mg total) by mouth daily with breakfast. 7 tablet 0   sitaGLIPtan-metformin (JANUMET) 50-500 MG per tablet Take 1 tablet by mouth 2 (two) times daily with a meal.     No current facility-administered medications for this visit.    VITAL SIGNS: There were no vitals taken for this visit. There were no vitals filed for this visit.  Estimated body mass index is 20.19 kg/m as calculated from the following:   Height as of 10/19/22: 5' (  1.524 m).   Weight as of 10/21/22: 103 lb 6.4 oz (46.9 kg).   PERFORMANCE STATUS (ECOG) : 1 - Symptomatic but completely ambulatory   IMPRESSION:   Neoplasm related pain Ms. Goldsberry is appreciative that her headaches have improved and somewhat resolved. Occasional abdominal discomfort however this is manageable at this time. Not requiring daily medications.   Will continue to closely monitor.  Patient and family knows to contact office with any needs.   Decreased appetite/nausea Family reports appetite fluctuates.  Some days are better  than others.  We continued discussions regarding increase in high protein foods. Education provided on ways to increase fatigue included use of vitamin B12.   Goals of care   8/6- We discussed her current illness and what it means in the larger context of her on-going co-morbidities. Natural disease trajectory and expectations were discussed.   Patient and her daughter are realistic and understanding of her cancer diagnosis and prognosis.  They are clear in their expressed wishes to manage her symptoms aggressively allow her every opportunity to continue to thrive and minimize any suffering or symptoms as best possible.  They continue to wish to follow-up with palliative here at the cancer center and defer restarting hospice services at this time.  They understand this can be restarted in anytime by discussing with their medical team.  We discussed Her current illness and what it means in the larger context of Her on-going co-morbidities. Natural disease trajectory and expectations were discussed.  I discussed the importance of continued conversation with family and their medical providers regarding overall plan of care and treatment options, ensuring decisions are within the context of the patients values and GOCs.  PLAN:  Roxicodone 5 mg every 6 hours as needed for pain. Not requiring daily.  Tylenol ES every 8 hours as needed MiraLAX daily for bowel regimen Zofran as needed for nausea Omeprazole daily Ongoing symptom management and goals of care support I will plan to see patient back in 1-2 weeks in collaboration to other oncology appointments.    Patient expressed understanding and was in agreement with this plan. She also understands that She can call the clinic at any time with any questions, concerns, or complaints.   Any controlled substances utilized were prescribed in the context of palliative care. PDMP has been reviewed.    Visit consisted of counseling and education dealing  with the complex and emotionally intense issues of symptom management and palliative care in the setting of serious and potentially life-threatening illness.Greater than 50%  of this time was spent counseling and coordinating care related to the above assessment and plan.  Willette Alma, AGPCNP-BC  Palliative Medicine Team/[facility]  *Please note that this is a verbal dictation therefore any spelling or grammatical errors are due to the "Dragon Medical One" system interpretation.

## 2022-11-01 NOTE — Progress Notes (Unsigned)
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  ERCP performed on March 29, 2022 and the cytology was negative. -She was readmitted to hospital in April 2024 with abdominal pain, nausea, and low appetite, CT and MRI showed increased diffuse dilatation of intrahepatic bile duct due to 2.1 cm soft tissue mass or lymphadenopathy in the portal hepatitis, concerning for cholangiocarcinoma.  She underwent repeated the ERCP, and stent placement, a malignant stricture was seen in the CBD, and a biopsy confirmed adenocarcinoma. -She has localized extrahepatic cholangiocarcinoma, likely in the hilar area.  No image evidence of metastasis. -she has met surgeon Dr. Modesta Messing. Surgery was offered but pt declined due to concerns of quality of life  -I discussed option of radiation, or systemic therapy, which will be palliative, not curative. -her tumor tested showed normal MMR, she is not a candidate for immunotherapy alone.   -Patient clearly indicates that she does not want any chemo or radiation, she does not want to prolong her life, quality of life is most important to her.  I referred her to home hospice in June 2024 -hospice care placed on hold due to desire for treatment for chronic daily headaches.  -she does continue routine visits with palliative care here at the cancer center for management of pain and headaches.  -patient's daughter feels like the patient may be getting depressed. States that patient is able to be more active, but no  longer wants to go anywhere, especially in public.  -has noted her mom has been laying down and sleeping more often.   Chronic daily headache Consulted with Dr. Barbaraann Cao 10/21/2022 due to chronic daily headaches.  -she underwent MRI of the head with and without contrast with the following findings: -1. No acute intracranial abnormality or significant interval change. 2. Generalized atrophy and white matter disease is mildly advanced for age. This likely reflects the sequela of chronic microvascular ischemia. 3. 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 4. Linear lacunar infarcts of the cerebellum are new since the prior exam but not acute. She has completed a 7 day course of prednisone to help taper her off daily use of Tylenol and intermittent oxycodone.  She was referred for sleep study due to reported loud snoring, daytime sleepiness, and recent neurological decline.  Patient states that she did very well with prednisone taper. Headaches and nausea have both improved.  Waiting on referral to neurology for sleep study.  Continues to feel weak and very fatigued.    Plan: Improved daily headaches. Not using as much medication to treat symptoms. Nausea has also improved.  Has not heard from neurology regarding sleep study. Will ask Dr. Barbaraann Cao if home sleep study may be appropriate for her.   Add mirtazapine 7.5 mg every evening to help with new depression. This may also improve sleep and appetite.  Labs in September along with appointment with palliative care.   The patient understands the plans discussed today and is in agreement with them.  She knows to contact  our office if she develops concerns prior to her next appointment.  I provided 30 minutes of face-to-face time during this encounter and > 50% was spent counseling as documented under my assessment and plan.    Carlean Jews, NP  [place]  [facility] [address] [place] [address] Dept: [phone] Dept Fax: [phone]   No orders of the defined types were placed in this encounter.     CHIEF COMPLAINT:  CC: primary cholangiocarcinoma of extrahepatic bile duct   Current Treatment:  palliative care.   INTERVAL HISTORY:  Adallyn is here today for repeat clinical assessment. She denies fevers or chills. She denies pain. Her appetite is good. Her weight has been stable.  Primary cholangiocarcinoma of extrahepatic bile duct Patient has been clear about declination of surgery, palliative chemotherapy, or palliative radiation. Now seeing neuro-oncology for evaluation and treatment of chronic daily headaches.  -it has been recommended she have sleep study for evaluation of obstructive sleep apnea.  Continues to see palliative care for management of cancer-related pain and headaches.  Developing some mild depression. Able to be more active, but does not want to go out and do anything.   I have reviewed the past medical history, past surgical history, social history and family history with the patient and they are unchanged from previous note.  ALLERGIES:  is allergic to phenazopyridine, pyridium [phenazopyridine hcl], sulfamethoxazole-trimethoprim, sulfa antibiotics, ciprofloxacin hcl, esomeprazole magnesium, ferrous sulfate, macrodantin [nitrofurantoin macrocrystal], pantoprazole sodium, promethazine, and tramadol.  MEDICATIONS:  Current Outpatient Medications  Medication Sig Dispense Refill   glucose blood (ACCU-CHEK AVIVA PLUS) test strip Use to check blood sugar 3 times daily. 90 strip 5   levothyroxine (SYNTHROID, LEVOTHROID) 75 MCG tablet Take 75 mcg by mouth daily.     losartan (COZAAR) 50 MG tablet Take 50 mg by mouth daily.     melatonin 5 MG TABS Take 5 mg by mouth at bedtime.     mirtazapine (REMERON) 7.5 MG tablet Take 1 tablet (7.5 mg total) by  mouth at bedtime. 30 tablet 1   Omega-3 Fatty Acids (FISH OIL PO) Take 1 tablet by mouth daily.     omeprazole (PRILOSEC) 40 MG capsule Take 1 capsule (40 mg total) by mouth daily. 30 capsule 1   pravastatin (PRAVACHOL) 80 MG tablet Take 80 mg by mouth daily.     sitaGLIPtan-metformin (JANUMET) 50-500 MG per tablet Take 1 tablet by mouth 2 (two) times daily with a meal.     ondansetron (ZOFRAN) 4 MG tablet Take 1 tablet (4 mg total) by mouth every 6 (six) hours as needed for nausea. (Patient not taking: Reported on 11/02/2022) 20 tablet 0   oxyCODONE (ROXICODONE) 5 MG/5ML solution Take 5 mLs (5 mg total) by mouth every 6 (six) hours as needed for severe pain or moderate pain. (Patient not taking: Reported on 11/02/2022) 240 mL 0   No current facility-administered medications for this visit.    HISTORY OF PRESENT ILLNESS:   Oncology History Overview Note   Cancer Staging  Primary cholangiocarcinoma of extrahepatic bile duct (HCC) Staging form: Perihilar Bile Ducts, AJCC 8th Edition - Clinical: Stage Unknown (cTX, cN0, cM0) - Signed by Malachy Mood, MD on 08/26/2022     Primary cholangiocarcinoma of extrahepatic bile duct (HCC)  07/29/2022 Pathology Results     FINAL MICROSCOPIC DIAGNOSIS:   A.  COMMON HEPATIC DUCT, STRICTURE, BIOPSY:  Moderately differentiated adenocarcinoma     08/11/2022  Initial Diagnosis   Primary cholangiocarcinoma of extrahepatic bile duct (HCC)   08/17/2022 Imaging   BRAIN MR W WO CONTRAST   IMPRESSION: 1. No acute intracranial abnormality or significant interval change. 2. Generalized atrophy and white matter disease is mildly advanced for age. This likely reflects the sequela of chronic microvascular ischemia. 3. 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 4. Linear lacunar infarcts of the cerebellum are new since the prior exam but not acute.    08/26/2022 Cancer Staging    Staging form: Perihilar Bile Ducts, AJCC 8th Edition - Clinical: Stage Unknown (cTX, cN0, cM0) - Signed by Malachy Mood, MD on 08/26/2022       REVIEW OF SYSTEMS:   Constitutional: Denies fevers, chills or abnormal weight loss. Reports fatigue and weakness.  Eyes: Denies blurriness of vision Ears, nose, mouth, throat, and face: Denies mucositis or sore throat Respiratory: Denies cough, dyspnea or wheezes Cardiovascular: Denies palpitation, chest discomfort or lower extremity swelling Gastrointestinal: improved nausea. Denies heartburn or change in bowel habits Skin: Denies abnormal skin rashes. Does have some itchiness of her skin. Lymphatics: Denies new lymphadenopathy or easy bruising Neurological:Denies numbness or tingling. Does have weakness. Improved headaches since treatment with prednisone.  Behavioral/Psych: new depression with decreased interest in activities she would normally find enjoyable.  All other systems were reviewed with the patient and are negative.   Today's Vitals   11/02/22 0950 11/02/22 0956  BP: 135/60   Pulse: 91   Resp: 18   Temp: 98.1 F (36.7 C)   SpO2: 100%   Weight: 102 lb 8 oz (46.5 kg)   PainSc:  0-No pain   Body mass index is 20.02 kg/m.   Wt Readings from Last 3 Encounters:  11/02/22 102 lb 8 oz (46.5 kg)  10/21/22 103 lb 6.4 oz (46.9 kg)  10/19/22 102 lb 6.4 oz (46.4 kg)    Body mass index is 20.02 kg/m.  Performance status (ECOG): 2 - Symptomatic, <50% confined to bed  PHYSICAL EXAM:   GENERAL:alert, no distress and comfortable SKIN: skin color, texture, turgor are normal, no rashes or significant lesions EYES: normal, Conjunctiva are pink and non-injected, sclera clear OROPHARYNX:no exudate, no erythema and lips, buccal mucosa, and tongue normal  NECK: supple, thyroid normal size, non-tender, without nodularity LYMPH:  no palpable lymphadenopathy in the cervical, axillary or inguinal LUNGS: clear to auscultation and percussion  with normal breathing effort HEART: regular rate & rhythm and no murmurs and no lower extremity edema ABDOMEN:abdomen soft, non-tender and normal bowel sounds Musculoskeletal:no cyanosis of digits and no clubbing  NEURO: alert & oriented x 3 with fluent speech, no focal motor/sensory deficits  LABORATORY DATA:  I have reviewed the data as listed    Component Value Date/Time   NA 127 (L) 10/11/2022 0905   K 3.8 10/11/2022 0905   CL 96 (L) 10/11/2022 0905   CO2 23 10/11/2022 0905   GLUCOSE 211 (H) 10/11/2022 0905   BUN 12 10/11/2022 0905   CREATININE 0.56 10/11/2022 0905   CREATININE 0.56 08/11/2022 1449   CREATININE 0.58 (L) 04/28/2022 1020   CALCIUM 9.1 10/11/2022 0905   CALCIUM 8.7 04/15/2011 1003   PROT 6.7 10/11/2022 0905   ALBUMIN 3.8 10/11/2022 0905   AST 28 10/11/2022 0905   AST 20 08/11/2022 1449   ALT 49 (H) 10/11/2022 0905   ALT 21 08/11/2022 1449   ALKPHOS 169 (H) 10/11/2022  0905   BILITOT 0.3 10/11/2022 0905   BILITOT 0.3 08/11/2022 1449   GFRNONAA >60 10/11/2022 0905   GFRNONAA >60 08/11/2022 1449   GFRAA >60 07/27/2016 0905    Lab Results  Component Value Date   WBC 7.0 10/11/2022   NEUTROABS 4.3 10/11/2022   HGB 10.8 (L) 10/11/2022   HCT 30.8 (L) 10/11/2022   MCV 87.5 10/11/2022   PLT 298 10/11/2022   Addendum I have seen the patient, examined her. I agree with the assessment and and plan and have edited the notes.   Ms Gertz is clinically doing well, her headaches has much improved with prednisone which was prescribed by Dr. Barbaraann Cao.  She is otherwise doing well clinically, no new concerns.  We reviewed symptom management, and will call in mirtazapine for her.  She notes what to watch at home, will continue supportive care.  She will call us if she needs to see [place] in the future.  I spent a total of 20 minutes for her visit today, more than 50% on face-to-face counseling.  Malachy Mood MD 11/02/2022

## 2022-11-02 ENCOUNTER — Inpatient Hospital Stay (HOSPITAL_BASED_OUTPATIENT_CLINIC_OR_DEPARTMENT_OTHER): Admitting: Nurse Practitioner

## 2022-11-02 ENCOUNTER — Other Ambulatory Visit: Payer: Self-pay

## 2022-11-02 ENCOUNTER — Inpatient Hospital Stay: Admitting: Nurse Practitioner

## 2022-11-02 VITALS — BP 135/60 | HR 91 | Temp 98.1°F | Resp 18 | Wt 102.5 lb

## 2022-11-02 DIAGNOSIS — F321 Major depressive disorder, single episode, moderate: Secondary | ICD-10-CM | POA: Diagnosis not present

## 2022-11-02 DIAGNOSIS — R519 Headache, unspecified: Secondary | ICD-10-CM

## 2022-11-02 DIAGNOSIS — G893 Neoplasm related pain (acute) (chronic): Secondary | ICD-10-CM | POA: Diagnosis not present

## 2022-11-02 DIAGNOSIS — C240 Malignant neoplasm of extrahepatic bile duct: Secondary | ICD-10-CM

## 2022-11-02 MED ORDER — MIRTAZAPINE 7.5 MG PO TABS
7.5000 mg | ORAL_TABLET | Freq: Every day | ORAL | 1 refills | Status: AC
Start: 2022-11-02 — End: ?

## 2022-11-03 ENCOUNTER — Other Ambulatory Visit: Payer: Self-pay | Admitting: Nurse Practitioner

## 2022-11-03 DIAGNOSIS — R530 Neoplastic (malignant) related fatigue: Secondary | ICD-10-CM

## 2022-11-03 DIAGNOSIS — C240 Malignant neoplasm of extrahepatic bile duct: Secondary | ICD-10-CM

## 2022-11-03 DIAGNOSIS — R519 Headache, unspecified: Secondary | ICD-10-CM

## 2022-11-03 NOTE — Progress Notes (Signed)
Order placed for home sleep study for further evaluation of chronic daily headaches and increased fatigue related to cancer diagnosis.

## 2022-11-08 ENCOUNTER — Telehealth: Payer: Self-pay

## 2022-11-08 NOTE — Telephone Encounter (Signed)
Pt daughter called the office to make [place] aware of her mom new symptom that she had developed after being prescribe Mirtazapine for depression. Pt daughter stated that the day after starting  the Mirtazapine her mother  started to feel miserable, body aches and a decrease in appetite. Pt daughter even stated that she has not notice a difference after her mom being on the medication for a week. Pt daughter wanted to know if she should stop giving her mom the medication? I asked the daughter has her sleep being effected from the medication a well, and the daughter said no. I told the daughter to half the pill  per NP Heather. Pt daughter verbalized understanding.   Rondel Jumbo, CMA

## 2022-11-09 ENCOUNTER — Telehealth (HOSPITAL_BASED_OUTPATIENT_CLINIC_OR_DEPARTMENT_OTHER): Payer: Medicare Other | Admitting: Nurse Practitioner

## 2022-11-09 ENCOUNTER — Telehealth: Payer: Self-pay

## 2022-11-09 ENCOUNTER — Other Ambulatory Visit: Payer: Self-pay | Admitting: Nurse Practitioner

## 2022-11-09 ENCOUNTER — Encounter: Payer: Self-pay | Admitting: Nurse Practitioner

## 2022-11-09 DIAGNOSIS — G893 Neoplasm related pain (acute) (chronic): Secondary | ICD-10-CM | POA: Diagnosis not present

## 2022-11-09 DIAGNOSIS — C240 Malignant neoplasm of extrahepatic bile duct: Secondary | ICD-10-CM

## 2022-11-09 DIAGNOSIS — Z515 Encounter for palliative care: Secondary | ICD-10-CM | POA: Diagnosis not present

## 2022-11-09 MED ORDER — OXYCODONE HCL 5 MG/5ML PO SOLN: 5.0000 mg | Freq: Four times a day (QID) | ORAL | 0 refills | Status: AC | PRN

## 2022-11-09 NOTE — Telephone Encounter (Signed)
I connected with Lauren [name] on 11/09/22 at 1035am by phone and verified that I am speaking with the correct person using two identifiers.   I discussed the limitations, risks, security and privacy concerns of performing an evaluation and management service by telemedicine and the availability of in-person appointments. I also discussed with the patient that there may be a patient responsible charge related to this service. The patient expressed understanding and agreed to proceed.   Other persons participating in the visit and their role in the encounter: Lauren [name] (daughter)   Patient's location: Home  Provider's location: [facility]   Chief Complaint: Pain/Body Aches  I connected with Lauren [name] (patient's daughter) by phone to discuss concerns that patient is having body aches and pain. Patient is present near daughter during the call. Lauren [name] states patient began having discomfort on yesterday which has worsened today. Denies any medication changes. Patient is reporting intermittent chills. Daughter states they do not have a thermometer at home however does not think her mom has a temperature or complained of "feeling hot, just chills". Advised to pick up a thermometer when she picks up pain medication and also keep a close eye on symptoms given they all started suddenly on yesterday. She verbalized understanding. Denies shortness of blood or cough. Patient reports pain has been constant. Daughter shares patient moaning for several hours stating she could "just die". She took 3 advil an hour ago which has given her some relief and is now much calmer however still with some discomfort.   We discussed use of roxicodone for pain relief. She does not have on hand in the home. Daughter through out remaining medication after pain improved with steroids. Advised I will send in new prescription. She verbalized understanding and appreciation. Daughter is asking about restarting steroids for extended period of time.  Education provided on limited use and inability to use long-term.   Again emphasized signs and symptoms of possible infection or virus. Daughter verbalized understanding and knows when to contact office or seek further medical assistance.   All questions answered and support provided.   Any controlled substances utilized were prescribed in the context of palliative care. PDMP has been reviewed.    Visit consisted of counseling and education dealing with the complex and emotionally intense issues of symptom management and palliative care in the setting of serious and potentially life-threatening illness.Greater than 50%  of this time was spent counseling and coordinating care related to the above assessment and plan.  Lauren [name], AGPCNP-BC  Palliative Medicine Team/[facility]  *Please note that this is a verbal dictation therefore any spelling or grammatical errors are due to the "Dragon Medical One" system interpretation.

## 2022-11-09 NOTE — Telephone Encounter (Signed)
This nurse received a call from this patient's daughter stating that her mother has been having a stabbing pain all over her body.  She is taking Advil 600 mg but it is not effective.  Patient is very uncomfortable.  Daughter would like to know if provider can call in something for pain for her mother. Patient denies having fever, chills, nausea or lose stools.   Provider has been made aware of patient request.

## 2022-11-11 ENCOUNTER — Encounter: Payer: Self-pay | Admitting: Nurse Practitioner

## 2022-11-11 ENCOUNTER — Telehealth (HOSPITAL_BASED_OUTPATIENT_CLINIC_OR_DEPARTMENT_OTHER): Payer: Medicare Other | Admitting: Nurse Practitioner

## 2022-11-11 DIAGNOSIS — Z515 Encounter for palliative care: Secondary | ICD-10-CM

## 2022-11-11 DIAGNOSIS — R530 Neoplastic (malignant) related fatigue: Secondary | ICD-10-CM | POA: Diagnosis not present

## 2022-11-11 DIAGNOSIS — G893 Neoplasm related pain (acute) (chronic): Secondary | ICD-10-CM

## 2022-11-11 DIAGNOSIS — C240 Malignant neoplasm of extrahepatic bile duct: Secondary | ICD-10-CM | POA: Diagnosis not present

## 2022-11-11 NOTE — Telephone Encounter (Signed)
I connected with Lauren [name] on @TODAY @ at  by @VIRTUALVISITMETHODCHOICE @ and verified that I am speaking with the correct person using two identifiers.   I discussed the limitations, risks, security and privacy concerns of performing an evaluation and management service by telemedicine and the availability of in-person appointments. I also discussed with the patient that there may be a patient responsible charge related to this service. The patient expressed understanding and agreed to proceed.   Other persons participating in the visit and their role in the encounter: daughter, Lauren [name]   Patient's location: Home  Provider's location: [facility]    Chief Complaint: Pain/Chills  I connected by phone with patient's daughter, Lauren [name] and Lauren [name] for symptom management follow-up. Daughter sent My Chart message on yesterday evening expressing concerns regarding her mother's continued fatigue, pain, and body chills. Denies fever. Has been checking temperatures over the past 2 days with max temp of 97.7. patient denies urinary symptoms, cough, or congestion.   Tolerating Roxicodone without difficulty. Daughter expressed concerns with patient sleeping throughout the day on yesterday.   Thankfully Lauren [name] expresses she is feeling much better today. She was able to sleep throughout the night. She received a dose of Roxicodone for pain during the night which was helpful. This morning per daughter, she awakened around 630 am with minimal to no pain. Has not experienced any further chills. States she feels good. Has showered independently expressing this made her feel good. Ate 100% of her breakfast and is now up and preparing to go into the family room to watch television. Does endorse some fatigue which is not new. Daughter is appreciative of this improvement today. We discussed patient's symptoms over the past 48 hours and her sleeping on yesterday may have been a result of her poor ability to rest the previous  nights. I also provided education regarding signs and symptoms of infections in addition to when to contact medical team or seek medical assistance. Daughter verbalized understanding and appreciation.   All questions answered and support provided.    Visit consisted of counseling and education dealing with the complex and emotionally intense issues of symptom management and palliative care in the setting of serious and potentially life-threatening illness.Greater than 50%  of this time was spent counseling and coordinating care related to the above assessment and plan.  Willette Alma, AGPCNP-BC  Palliative Medicine Team/[facility]  *Please note that this is a verbal dictation therefore any spelling or grammatical errors are due to the "Dragon Medical One" system interpretation.

## 2022-11-13 ENCOUNTER — Telehealth: Payer: Self-pay | Admitting: Hematology

## 2022-11-13 ENCOUNTER — Other Ambulatory Visit: Payer: Self-pay | Admitting: Hematology

## 2022-11-13 DIAGNOSIS — C240 Malignant neoplasm of extrahepatic bile duct: Secondary | ICD-10-CM

## 2022-11-13 NOTE — Telephone Encounter (Signed)
Pt's daughter called, patient has been getting progressively weaker, not able to walk by herself.  She also has decreased appetite, not eating much.  Patient and her daughter want to enroll her to hospice again.  They called her previous hospice program, but they are not available over this weekend.  So I called [facility] for an urgent home hospice referral.  I spoke with the on-call nurse, and they will contact patient's daughter tomorrow and start service tomorrow.  I informed patient's daughter, and answered her questions.  She knows to call me if they have additional concerns.Malachy Mood MD 11/13/2022 8:19 PM

## 2022-11-16 ENCOUNTER — Other Ambulatory Visit: Payer: Self-pay | Admitting: Gastroenterology

## 2022-11-17 NOTE — Progress Notes (Unsigned)
Palliative Medicine [facility]  [phone] [phone]   Name: Lauren [name] Date: 11/17/2022 MRN: [id number]  DOB: 1940/05/31  Patient Care Team: Pearson Grippe, MD as PCP - General (Internal Medicine) Rhea Belton, Carie Caddy, MD as Consulting Physician (Gastroenterology) Malachy Mood, MD as Consulting Physician (Oncology)   I connected with Kathrin Ruddy on 11/19/22 at 11:30 AM EDT by phone and verified that I am speaking with the correct person using two identifiers.   I discussed the limitations, risks, security and privacy concerns of performing an evaluation and management service by telemedicine and the availability of in-person appointments. I also discussed with the patient that there may be a patient responsible charge related to this service. The patient expressed understanding and agreed to proceed.   Other persons participating in the visit and their role in the encounter: Shakeya Raudales (daughter)  Patient's location: home  Provider's location: [facility]   Chief Complaint: f/u of symptom management   INTERVAL HISTORY: Lauren [name] is a 82 y.o. 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 Patient was previously under hospice services and has rescinded those services, palliative asked to see for symptom management and goals of care.   SOCIAL HISTORY:     reports that she has never smoked. She has never used smokeless tobacco. She reports that she does not drink alcohol and does not use drugs.  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   Possible related to pyridium  G6PD studies pending   Hemorrhoids    Hiatal hernia    Hyperlipidemia    Hyperlipidemia    Hypertension    Hypothyroidism    IBS (irritable bowel syndrome)    Iron overload 07/13/2016   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   Osteoporosis    UTI (lower urinary tract infection)    Vitamin D deficiency     ALLERGIES:  is allergic to phenazopyridine, pyridium [phenazopyridine hcl], sulfamethoxazole-trimethoprim, sulfa antibiotics, ciprofloxacin hcl, esomeprazole magnesium, ferrous sulfate, macrodantin [nitrofurantoin macrocrystal], pantoprazole sodium, promethazine, and tramadol.  MEDICATIONS:  Current Outpatient Medications  Medication Sig Dispense Refill   glucose blood (ACCU-CHEK AVIVA PLUS) test strip Use to check blood sugar 3 times daily. 90 strip 5   levothyroxine (SYNTHROID, LEVOTHROID) 75 MCG tablet Take 75 mcg by mouth daily.     losartan (COZAAR) 50 MG tablet Take 50 mg by mouth daily.     melatonin 5 MG TABS Take 5 mg by mouth at bedtime.     mirtazapine (REMERON) 7.5 MG tablet Take 1 tablet (7.5 mg total) by mouth at bedtime. 30 tablet 1   Omega-3 Fatty Acids (FISH OIL PO) Take 1 tablet by mouth daily.     omeprazole (PRILOSEC) 40 MG capsule Take 1 capsule (40 mg total) by mouth daily. 30 capsule 1   ondansetron (ZOFRAN) 4 MG tablet Take 1 tablet (4 mg total) by mouth every 6 (six) hours as needed for nausea. (Patient not taking: Reported  on 11/02/2022) 20 tablet 0   oxyCODONE (ROXICODONE) 5 MG/5ML solution Take 5 mLs (5 mg total) by mouth every 6 (six) hours as needed for severe pain or moderate pain. 240 mL 0   pravastatin (PRAVACHOL) 80 MG tablet Take 80 mg by mouth daily.     sitaGLIPtan-metformin (JANUMET) 50-500 MG per tablet Take 1 tablet by mouth 2 (two) times daily with a meal.     No current facility-administered medications for this visit.    VITAL SIGNS: There were no vitals  taken for this visit. There were no vitals filed for this visit.  Estimated body mass index is 20.02 kg/m as calculated from the following:   Height as of 10/19/22: 5' (1.524 m).   Weight as of 11/02/22: 102 lb 8 oz (46.5 kg).   PERFORMANCE STATUS (ECOG) : 1 - Symptomatic but completely ambulatory   IMPRESSION:  I connected with Ms. Ostergren patient daughter by phone. Patient is also present. Daughter reports patient is doing as well as expected. Ongoing fatigue. Denies nausea, vomiting, constipation, or diarrhea. No further chills.   Neoplasm related pain Ms. Sandstedt is appreciative that her headaches have improved and somewhat resolved. Occasional abdominal discomfort however this is manageable at this time. Tolerating Roxicodone as needed.     Decreased appetite/nausea Family reports appetite fluctuates.  Some days are better than others.   Goals of care  Daughter confirms patient has re-enrolled with [facility]'s hospice program. We discussed this is very much appropriate. She is pleased with the daily support and care. She is aware the team is available as needed.   8/6- We discussed her current illness and what it means in the larger context of her on-going co-morbidities. Natural disease trajectory and expectations were discussed.   Patient and her daughter are realistic and understanding of her cancer diagnosis and prognosis.  They are clear in their expressed wishes to manage her symptoms aggressively allow her every opportunity to continue to thrive and minimize any suffering or symptoms as best possible.  They continue to wish to follow-up with palliative here at the cancer center and defer restarting hospice services at this time.  They understand this can be restarted in anytime by discussing with their medical team.  We discussed Her current illness and what it means in the larger context of Her on-going co-morbidities. Natural disease trajectory and expectations were  discussed.  I discussed the importance of continued conversation with family and their medical providers regarding overall plan of care and treatment options, ensuring decisions are within the context of the patients values and GOCs.  PLAN:  Roxicodone 5 mg every 6 hours as needed for pain. Not requiring daily.  Tylenol ES every 8 hours as needed MiraLAX daily for bowel regimen Zofran as needed for nausea Omeprazole daily Ongoing symptom management and goals of care support [facility] hospice in place   Patient expressed understanding and was in agreement with this plan. She also understands that She can call the clinic at any time with any questions, concerns, or complaints.   Any controlled substances utilized were prescribed in the context of palliative care. PDMP has been reviewed.    Visit consisted of counseling and education dealing with the complex and emotionally intense issues of symptom management and palliative care in the setting of serious and potentially life-threatening illness.Greater than 50%  of this time was spent counseling and coordinating care related to the above assessment and plan.  Willette Alma, AGPCNP-BC  Palliative Medicine Team/  Cancer Center  *Please note that this is a verbal dictation therefore any spelling or grammatical errors are due to the "[employer] One" system interpretation.

## 2022-11-18 ENCOUNTER — Telehealth: Payer: Self-pay | Admitting: Genetic Counselor

## 2022-11-18 NOTE — Telephone Encounter (Signed)
Changed genetics appt to phone visit per family request

## 2022-11-22 ENCOUNTER — Inpatient Hospital Stay: Payer: Medicare Other

## 2022-11-22 ENCOUNTER — Inpatient Hospital Stay (HOSPITAL_BASED_OUTPATIENT_CLINIC_OR_DEPARTMENT_OTHER): Payer: Medicare Other | Admitting: Nurse Practitioner

## 2022-11-22 ENCOUNTER — Encounter: Payer: Self-pay | Admitting: Nurse Practitioner

## 2022-11-22 ENCOUNTER — Inpatient Hospital Stay: Payer: Medicare Other | Attending: Hematology | Admitting: Genetic Counselor

## 2022-11-22 ENCOUNTER — Encounter: Payer: Self-pay | Admitting: Genetic Counselor

## 2022-11-22 DIAGNOSIS — Z515 Encounter for palliative care: Secondary | ICD-10-CM

## 2022-11-22 DIAGNOSIS — C240 Malignant neoplasm of extrahepatic bile duct: Secondary | ICD-10-CM

## 2022-11-22 DIAGNOSIS — R339 Retention of urine, unspecified: Secondary | ICD-10-CM | POA: Diagnosis not present

## 2022-11-22 DIAGNOSIS — Z7189 Other specified counseling: Secondary | ICD-10-CM | POA: Diagnosis not present

## 2022-11-22 DIAGNOSIS — R530 Neoplastic (malignant) related fatigue: Secondary | ICD-10-CM | POA: Diagnosis not present

## 2022-11-22 DIAGNOSIS — G893 Neoplasm related pain (acute) (chronic): Secondary | ICD-10-CM | POA: Diagnosis not present

## 2022-11-22 NOTE — Progress Notes (Signed)
REFERRING PROVIDER: Malachy Mood, MD [address] [place],  [place] [address]  PRIMARY PROVIDER:  Pearson Grippe, MD  PRIMARY REASON FOR VISIT:  1. Primary cholangiocarcinoma of extrahepatic bile duct (HCC)    I connected with Lauren [name] and her daughter on 11/22/2022 at 10am by telephone.  HISTORY OF PRESENT ILLNESS:   Lauren [name], a 82 y.o. female, was seen for a [place] cancer genetics consultation at the request of Dr Mosetta Putt due to a personal history of cholangiocarcinoma.  Lauren [name] presents to clinic today to discuss the possibility of a hereditary predisposition to cancer, to discuss genetic testing, and to further clarify her future cancer risks, as well as potential cancer risks for family members.   In May 2024, at the age of 1, Lauren [name] was diagnosed with primary cholangiocarcinoma of the extrahepatic bile duct.    CANCER HISTORY:  Oncology History Overview Note   Cancer Staging  Primary cholangiocarcinoma of extrahepatic bile duct (HCC) Staging form: Perihilar Bile Ducts, AJCC 8th Edition - Clinical: Stage Unknown (cTX, cN0, cM0) - Signed by Malachy Mood, MD on 08/26/2022     Primary cholangiocarcinoma of extrahepatic bile duct (HCC)  07/29/2022 Pathology Results     FINAL MICROSCOPIC DIAGNOSIS:   A.  COMMON HEPATIC DUCT, STRICTURE, BIOPSY:  Moderately differentiated adenocarcinoma     08/11/2022 Initial Diagnosis   Primary cholangiocarcinoma of extrahepatic bile duct (HCC)   08/17/2022 Imaging   BRAIN MR W WO CONTRAST   IMPRESSION: 1. No acute intracranial abnormality or significant interval change. 2. Generalized atrophy and white matter disease is mildly advanced for age. This likely reflects the sequela of chronic microvascular ischemia. 3. 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 4. Linear lacunar infarcts of the cerebellum are new since the prior exam but not acute.     08/26/2022 Cancer Staging   Staging form: Perihilar Bile Ducts, AJCC 8th Edition - Clinical: Stage Unknown (cTX, cN0, cM0) - Signed by Malachy Mood, MD on 08/26/2022      SCREENING/RISK FACTORS:  Colonoscopy: yes;  less than 10 lifetime polyps .   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   Possible related to pyridium  G6PD studies pending   Hemorrhoids    Hiatal hernia    Hyperlipidemia    Hyperlipidemia    Hypertension    Hypothyroidism    IBS (irritable bowel syndrome)    Iron overload 07/13/2016   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   Osteoporosis    UTI (lower urinary tract infection)    Vitamin D deficiency     Past Surgical History:  Procedure Laterality Date   BILIARY BRUSHING  06/29/2022   Procedure: BILIARY BRUSHING;  Surgeon: Hilarie Fredrickson, MD;  Location: [facility];  Service: Gastroenterology;;   BILIARY BRUSHING  07/29/2022   Procedure: BILIARY BRUSHING;  Surgeon: Lemar Lofty., MD;  Location: [facility];  Service: Gastroenterology;;   BILIARY STENT PLACEMENT N/A 06/29/2022   Procedure: BILIARY STENT PLACEMENT;  Surgeon: Hilarie Fredrickson, MD;  Location: [facility];  Service: Gastroenterology;  Laterality: N/A;   BILIARY STENT  PLACEMENT N/A 07/29/2022   Procedure: BILIARY STENT PLACEMENT;  Surgeon: Meridee Score Netty Starring., MD;  Location: [facility];  Service: Gastroenterology;  Laterality: N/A;   BIOPSY  07/29/2022   Procedure: BIOPSY;  Surgeon: Meridee Score Netty Starring., MD;  Location: [facility];  Service: Gastroenterology;;  spybite   CHOLECYSTECTOMY N/A 03/31/2022   Procedure: LAPAROSCOPIC CHOLECYSTECTOMY;  Surgeon: Axel Filler, MD;  Location: [facility];  Service: General;  Laterality: N/A;   COLONOSCOPY      ENDOSCOPIC RETROGRADE CHOLANGIOPANCREATOGRAPHY (ERCP) WITH PROPOFOL N/A 07/29/2022   Procedure: ENDOSCOPIC RETROGRADE CHOLANGIOPANCREATOGRAPHY (ERCP) WITH PROPOFOL;  Surgeon: Lemar Lofty., MD;  Location: [facility];  Service: Gastroenterology;  Laterality: N/A;   ERCP N/A 03/29/2022   Procedure: ENDOSCOPIC RETROGRADE CHOLANGIOPANCREATOGRAPHY (ERCP);  Surgeon: Iva Boop, MD;  Location: [facility];  Service: Gastroenterology;  Laterality: N/A;   ERCP N/A 06/29/2022   Procedure: ENDOSCOPIC RETROGRADE CHOLANGIOPANCREATOGRAPHY (ERCP);  Surgeon: Hilarie Fredrickson, MD;  Location: [facility];  Service: Gastroenterology;  Laterality: N/A;   ESOPHAGOGASTRODUODENOSCOPY N/A 07/29/2022   Procedure: ESOPHAGOGASTRODUODENOSCOPY (EGD);  Surgeon: Lemar Lofty., MD;  Location: [facility];  Service: Gastroenterology;  Laterality: N/A;   EUS N/A 07/29/2022   Procedure: UPPER ENDOSCOPIC ULTRASOUND (EUS) RADIAL;  Surgeon: Lemar Lofty., MD;  Location: [facility];  Service: Gastroenterology;  Laterality: N/A;   FINE NEEDLE ASPIRATION N/A 07/29/2022   Procedure: FINE NEEDLE ASPIRATION (FNA) LINEAR;  Surgeon: Lemar Lofty., MD;  Location: [facility];  Service: Gastroenterology;  Laterality: N/A;   PARTIAL KNEE ARTHROPLASTY Left 02/25/2020   Procedure: UNICOMPARTMENTAL KNEE;  Surgeon: Ollen Gross, MD;  Location: [facility];  Service: Orthopedics;  Laterality: Left;    REMOVAL OF STONES  03/29/2022   Procedure: REMOVAL OF STONES;  Surgeon: Iva Boop, MD;  Location: [facility];  Service: Gastroenterology;;   REMOVAL OF STONES  07/29/2022   Procedure: REMOVAL OF SLUDGE;  Surgeon: Lemar Lofty., MD;  Location: [facility];  Service: Gastroenterology;;  swept duct   SPHINCTEROTOMY  03/29/2022   Procedure: Dennison Mascot;  Surgeon: Iva Boop, MD;  Location: [facility];  Service: Gastroenterology;;   Burman Freestone CHOLANGIOSCOPY N/A 07/29/2022   Procedure:  ZOXWRUEA CHOLANGIOSCOPY;  Surgeon: Lemar Lofty., MD;  Location: [facility];  Service: Gastroenterology;  Laterality: N/A;   STENT REMOVAL  07/29/2022   Procedure: STENT REMOVAL;  Surgeon: Meridee Score Netty Starring., MD;  Location: [facility];  Service: Gastroenterology;;    FAMILY HISTORY:    Lauren [name] was adopted and does not have information about biological relatives in older generations.  She has one daughter and two sons, none of which have had cancer.  Lauren [name] daughter stated that she had normal colonoscopies previously.   There is no reported Ashkenazi Jewish ancestry.   GENETIC COUNSELING ASSESSMENT: Lauren [name] is a 81 y.o. female with a personal history of cancer which is not highly suggestive of a hereditary cancer syndrome. We, therefore, discussed and recommended the following at today's visit.   DISCUSSION:   We discussed that, in general, most cancer is not inherited in families, but instead is sporadic or familial. Sporadic cancers occur by chance and typically happen at older ages (>50 years) as this type of cancer is caused by genetic changes acquired during an individual's lifetime. Some families have more cancers than would be expected by chance; however, the ages or types of cancer are not consistent with a known genetic mutation or known genetic mutations have been ruled out. This type of familial cancer  is thought to be due to a combination of multiple genetic, environmental, hormonal, and lifestyle factors. While this combination of factors likely increases the risk of cancer, the exact source of this risk is not currently identifiable or testable.    We discussed that approximately 5-10% of cancer cancer is hereditary, meaning that it is due to a mutation in a single gene that is passed down from generation to generation in a family. Most hereditary cases of bile duct cancer are cancer are associated with Lynch syndrome. We discussed that testing can be beneficial  for several reasons, including knowing about other cancer risks and understanding if other family members could be at risk for cancer and allow them to undergo genetic testing.  We discussed with Lauren [name] that the personal history does not meet insurance or NCCN criteria for genetic testing and, therefore, is not highly consistent with a familial hereditary cancer syndrome.  We feel she is at low risk to harbor a gene mutation associated with such a condition.  We discussed that it is reasonable to proceed with genetic testing given that she was adopted and has limited information about health history.   PLAN:  Lauren [name] did not wish to pursue genetic testing at today's visit.  Her daughter felt comfortable knowing that most cholangiocarcinoma is not hereditary and did not want add additional stress to her mother with the sample collection/results. We understand this decision and remain available to coordinate genetic testing at any time in the future, if needed.   We recommend Lauren [name] continue to follow the cancer screening/management guidelines of her oncologists.  Individuals in this family might be at some increased risk of developing cancer, over the general population risk, due to the family history of cancer.  Individuals in the family should notify their providers of the family history of cancer. Family members should have colonoscopies by at age 69, or earlier, as recommended by their providers.  Lastly, we encouraged Lauren [name] family to remain in contact with cancer genetics so that we can continuously update the family history and inform her of any changes in cancer genetics and testing that may be of benefit for this family.   Our contact information was provided should additional questions or concerns arise. Thank you for the referral and allowing [place] to share in the care of your patient.   Abishai Viegas M. Rennie Plowman, MS, [facility] Genetic Counselor [email or web address] (P) [phone]  The patient  was seen for less than 15 minutes of audio-only genetic counseling.  Interpreter use was declined.  Daughter interpreted to Lauren [name].

## 2022-11-23 ENCOUNTER — Telehealth: Payer: Self-pay

## 2022-11-23 NOTE — Telephone Encounter (Signed)
FYI- Dr Meridee Score ,  Spoke with family and they have decided with their mothers declining health no further GI procedures would be needed.

## 2022-11-23 NOTE — Telephone Encounter (Signed)
I am sorry to hear this. I think that is reasonable. Please cancel her Recall and place this as notation in the chart. GM

## 2022-12-07 ENCOUNTER — Ambulatory Visit (HOSPITAL_BASED_OUTPATIENT_CLINIC_OR_DEPARTMENT_OTHER): Payer: Medicare Other | Attending: Internal Medicine | Admitting: Internal Medicine

## 2022-12-14 DEATH — deceased
